# Patient Record
Sex: Female | Born: 1935
Health system: Southern US, Community
[De-identification: ages and names within clinical notes are randomized; demographics above are authoritative.]

## PROBLEM LIST (undated history)

## (undated) DIAGNOSIS — E785 Hyperlipidemia, unspecified: Secondary | ICD-10-CM

## (undated) DIAGNOSIS — R42 Dizziness and giddiness: Secondary | ICD-10-CM

## (undated) DIAGNOSIS — T4145XA Adverse effect of unspecified anesthetic, initial encounter: Secondary | ICD-10-CM

## (undated) DIAGNOSIS — M159 Polyosteoarthritis, unspecified: Secondary | ICD-10-CM

## (undated) DIAGNOSIS — I499 Cardiac arrhythmia, unspecified: Secondary | ICD-10-CM

## (undated) DIAGNOSIS — R011 Cardiac murmur, unspecified: Secondary | ICD-10-CM

## (undated) DIAGNOSIS — D649 Anemia, unspecified: Secondary | ICD-10-CM

## (undated) DIAGNOSIS — G4730 Sleep apnea, unspecified: Secondary | ICD-10-CM

## (undated) DIAGNOSIS — I00 Rheumatic fever without heart involvement: Secondary | ICD-10-CM

## (undated) DIAGNOSIS — I10 Essential (primary) hypertension: Secondary | ICD-10-CM

## (undated) DIAGNOSIS — K219 Gastro-esophageal reflux disease without esophagitis: Secondary | ICD-10-CM

## (undated) DIAGNOSIS — F419 Anxiety disorder, unspecified: Secondary | ICD-10-CM

## (undated) DIAGNOSIS — M353 Polymyalgia rheumatica: Secondary | ICD-10-CM

## (undated) DIAGNOSIS — H9191 Unspecified hearing loss, right ear: Secondary | ICD-10-CM

## (undated) DIAGNOSIS — I480 Paroxysmal atrial fibrillation: Secondary | ICD-10-CM

## (undated) DIAGNOSIS — D126 Benign neoplasm of colon, unspecified: Secondary | ICD-10-CM

## (undated) DIAGNOSIS — T8859XA Other complications of anesthesia, initial encounter: Secondary | ICD-10-CM

## (undated) DIAGNOSIS — T7840XA Allergy, unspecified, initial encounter: Secondary | ICD-10-CM

## (undated) DIAGNOSIS — J301 Allergic rhinitis due to pollen: Secondary | ICD-10-CM

## (undated) DIAGNOSIS — N3941 Urge incontinence: Secondary | ICD-10-CM

## (undated) DIAGNOSIS — J479 Bronchiectasis, uncomplicated: Secondary | ICD-10-CM

## (undated) DIAGNOSIS — R112 Nausea with vomiting, unspecified: Secondary | ICD-10-CM

## (undated) DIAGNOSIS — IMO0002 Reserved for concepts with insufficient information to code with codable children: Secondary | ICD-10-CM

## (undated) DIAGNOSIS — Z9889 Other specified postprocedural states: Secondary | ICD-10-CM

## (undated) DIAGNOSIS — H9190 Unspecified hearing loss, unspecified ear: Secondary | ICD-10-CM

## (undated) DIAGNOSIS — H269 Unspecified cataract: Secondary | ICD-10-CM

## (undated) HISTORY — DX: Rheumatic fever without heart involvement: I00

## (undated) HISTORY — DX: Urge incontinence: N39.41

## (undated) HISTORY — DX: Hyperlipidemia, unspecified: E78.5

## (undated) HISTORY — DX: Allergy, unspecified, initial encounter: T78.40XA

## (undated) HISTORY — DX: Polyosteoarthritis, unspecified: M15.9

## (undated) HISTORY — PX: BACK SURGERY: SHX140

## (undated) HISTORY — DX: Paroxysmal atrial fibrillation: I48.0

## (undated) HISTORY — DX: Gastro-esophageal reflux disease without esophagitis: K21.9

## (undated) HISTORY — PX: EYE SURGERY: SHX253

## (undated) HISTORY — DX: Polymyalgia rheumatica: M35.3

## (undated) HISTORY — PX: ABDOMINAL HYSTERECTOMY: SHX81

## (undated) HISTORY — DX: Unspecified cataract: H26.9

## (undated) HISTORY — DX: Essential (primary) hypertension: I10

## (undated) HISTORY — PX: FRACTURE SURGERY: SHX138

## (undated) HISTORY — DX: Unspecified hearing loss, right ear: H91.91

## (undated) HISTORY — DX: Dizziness and giddiness: R42

## (undated) HISTORY — PX: TONSILLECTOMY AND ADENOIDECTOMY: SUR1326

## (undated) HISTORY — DX: Benign neoplasm of colon, unspecified: D12.6

## (undated) HISTORY — PX: SPINE SURGERY: SHX786

## (undated) HISTORY — DX: Reserved for concepts with insufficient information to code with codable children: IMO0002

## (undated) HISTORY — DX: Allergic rhinitis due to pollen: J30.1

---

## 1961-12-22 HISTORY — PX: OTHER SURGICAL HISTORY: SHX169

## 1971-12-23 HISTORY — PX: SYNOVECTOMY: SHX133

## 1994-12-22 HISTORY — PX: ANKLE FRACTURE SURGERY: SHX122

## 2006-11-09 HISTORY — PX: OTHER SURGICAL HISTORY: SHX169

## 2008-12-22 HISTORY — PX: BREAST BIOPSY: SHX20

## 2012-02-25 LAB — HM COLONOSCOPY

## 2014-02-28 LAB — HM MAMMOGRAPHY: HM Mammogram: NORMAL

## 2014-04-04 ENCOUNTER — Ambulatory Visit (INDEPENDENT_AMBULATORY_CARE_PROVIDER_SITE_OTHER): Payer: Medicare PPO | Admitting: Internal Medicine

## 2014-04-04 ENCOUNTER — Encounter: Payer: Self-pay | Admitting: Internal Medicine

## 2014-04-04 VITALS — BP 118/80 | HR 66 | Temp 98.0°F | Ht 66.0 in | Wt 178.0 lb

## 2014-04-04 DIAGNOSIS — J301 Allergic rhinitis due to pollen: Secondary | ICD-10-CM | POA: Insufficient documentation

## 2014-04-04 DIAGNOSIS — E782 Mixed hyperlipidemia: Secondary | ICD-10-CM | POA: Insufficient documentation

## 2014-04-04 DIAGNOSIS — K219 Gastro-esophageal reflux disease without esophagitis: Secondary | ICD-10-CM | POA: Insufficient documentation

## 2014-04-04 DIAGNOSIS — I10 Essential (primary) hypertension: Secondary | ICD-10-CM | POA: Insufficient documentation

## 2014-04-04 DIAGNOSIS — M353 Polymyalgia rheumatica: Secondary | ICD-10-CM | POA: Insufficient documentation

## 2014-04-04 DIAGNOSIS — E785 Hyperlipidemia, unspecified: Secondary | ICD-10-CM | POA: Insufficient documentation

## 2014-04-04 DIAGNOSIS — M159 Polyosteoarthritis, unspecified: Secondary | ICD-10-CM | POA: Insufficient documentation

## 2014-04-04 DIAGNOSIS — IMO0002 Reserved for concepts with insufficient information to code with codable children: Secondary | ICD-10-CM | POA: Insufficient documentation

## 2014-04-04 DIAGNOSIS — I4891 Unspecified atrial fibrillation: Secondary | ICD-10-CM | POA: Insufficient documentation

## 2014-04-04 NOTE — Assessment & Plan Note (Signed)
She is okay with primary prevention but want to reconsider Myalgias with other statins

## 2014-04-04 NOTE — Progress Notes (Signed)
Subjective:    Patient ID: Jennifer Schmitt, female    DOB: [birth year], 79 y.o.   MRN: [id number]  HPI Here to establish Recently moved to [facility]  History of atrial fibrillation Goes back over 8 years At least 5 hospitalizations for irregular and fast heart CCB in past but not now Only on beta blocker now Had been on xarelto 20mg  but cardiologist reduced her to 10mg  Insurance now giving her problems about this---asked her to have cardiologist send in appeal  Rheumatic fever as a child Heart murmur in past Has had echo--not sure of results May have MVP  On Rx for both high blood pressure and high cholesterol for some time Doing fine on current meds  Lung problems with chronic bronchitis Has been better with cetirizine, astelin-- montelukast at times and nasal cortisone sprays Pollen and other allergens---multiple  Polymyalgia rheumatica for 18 months Was on prednisone for ~3 years ago Seems to be in remission again Ongoing arthritis symptoms that are mild---not taking anything regularly for this  GERD chronically Omeprazole daily and probiotics help No dysphagia persistently---may be more related to allergies  Adenomatous colon polyps Last colonoscopy 2013 was negative  Mild urge incontinence  Cervical and lumbar disc disease No meds for this now  No current outpatient prescriptions on file prior to visit.   No current facility-administered medications on file prior to visit.    Allergies  Allergen Reactions  . Actonel [Risedronate Sodium]   . Boniva [Ibandronic Acid]   . Fosamax [Alendronate Sodium]   . Latex   . Lipitor [Atorvastatin]   . Talwin [Pentazocine]     Past Medical History  Diagnosis Date  . Hypertension   . Hyperlipidemia   . GERD (gastroesophageal reflux disease)   . Allergic rhinitis due to pollen   . Atrial fibrillation   . PMR (polymyalgia rheumatica)   . Osteoarthritis, multiple sites   . Urge incontinence   . Adenomatous  colon polyp   . Rheumatic fever     Past Surgical History  Procedure Laterality Date  . Tonsillectomy and adenoidectomy    . Meningitis  1963  . Abdominal hysterectomy    . Breast biopsy Left 2010    negative  . Ankle fracture surgery Left 1996  . Synovectomy Right 1973    elbow    Family History  Problem Relation Age of Onset  . Schizophrenia Mother     paranoid  . Heart disease Mother     from psych meds  . Arthritis Mother   . Rheum arthritis Mother   . Cancer Father     prostate  . Arthritis Father   . Diabetes Father     History   Social History  . Marital Status: Married    Spouse Name: N/A    Number of Children: 1  . Years of Education: N/A   Occupational History  . 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 Smoking status: Never Smoker   . Smokeless tobacco: Never Used  . Alcohol Use: Yes  . Drug Use: No  . Sexual Activity: Not on file   Other Topics Concern  . Not on file   Social History Narrative   Retired 2007    1 son in [place] area   Spends part time in [place]      Has living will   Husband then son is health care POA   Would  accept resuscitation probably--but no prolonged ventilation   No tube feedings if cognitively unaware         Review of Systems  Constitutional:       Wears seat belt Weight is down from 4 years ago-- down 30# or so from peak Regular with exercise  HENT: Negative for hearing loss and tinnitus.   Eyes: Negative for visual disturbance.       Mild vision decline Some cataracts  Respiratory: Positive for cough. Negative for chest tightness and shortness of breath.        Episodic cough  Cardiovascular: Positive for palpitations. Negative for chest pain and leg swelling.  Gastrointestinal:       Heartburn controlled  Genitourinary:       Mild urge incontinence  Musculoskeletal: Positive for arthralgias and myalgias.    Allergic/Immunologic: Positive for environmental allergies. Negative for immunocompromised state.  Neurological: Negative for dizziness, syncope, light-headedness and headaches.  Psychiatric/Behavioral: Negative for sleep disturbance and dysphoric mood. The patient is nervous/anxious.        Sleeps better since weight loss. Sleep study done 3-4 years ago--- some apnea and abnormal movements Has xanax for rare anxiety spells       Objective:   Physical Exam  Constitutional: She appears well-developed and well-nourished. No distress.  Neck: Normal range of motion. Neck supple. No thyromegaly present.  Cardiovascular: Normal rate, regular rhythm, normal heart sounds and intact distal pulses.  Exam reveals no gallop.   No murmur heard. Pulmonary/Chest: Effort normal and breath sounds normal. No respiratory distress. She has no wheezes. She has no rales.  Abdominal: Soft. There is no tenderness.  Musculoskeletal: She exhibits no edema and no tenderness.  Lymphadenopathy:    She has no cervical adenopathy.  Psychiatric: She has a normal mood and affect. Her behavior is normal.          Assessment & Plan:

## 2014-04-04 NOTE — Assessment & Plan Note (Signed)
Regular now Needs to get approval for the xarelto Will establish with derm

## 2014-04-04 NOTE — Patient Instructions (Signed)
Please get the last 3 years records from your [facility] cardiologist and primary care doctor

## 2014-04-04 NOTE — Assessment & Plan Note (Signed)
Quiet In remission for over a year

## 2014-04-04 NOTE — Assessment & Plan Note (Signed)
Uses tylenol prn only

## 2014-04-04 NOTE — Assessment & Plan Note (Signed)
Severe Needs multiple meds

## 2014-04-04 NOTE — Assessment & Plan Note (Signed)
BP Readings from Last 3 Encounters:  04/04/14 118/80   Good control

## 2014-04-04 NOTE — Assessment & Plan Note (Signed)
Quiet on the med 

## 2014-04-04 NOTE — Progress Notes (Signed)
Pre visit review using our clinic review tool, if applicable. No additional management support is needed unless otherwise documented below in the visit note. 

## 2014-04-05 ENCOUNTER — Telehealth: Payer: Self-pay | Admitting: Internal Medicine

## 2014-04-05 NOTE — Telephone Encounter (Signed)
Relevant patient education mailed to patient.  

## 2014-04-20 ENCOUNTER — Encounter: Payer: Self-pay | Admitting: Cardiovascular Disease

## 2014-04-20 ENCOUNTER — Ambulatory Visit (INDEPENDENT_AMBULATORY_CARE_PROVIDER_SITE_OTHER): Payer: Medicare PPO | Admitting: Cardiovascular Disease

## 2014-04-20 VITALS — BP 120/72 | HR 64 | Ht 66.0 in | Wt 178.5 lb

## 2014-04-20 DIAGNOSIS — G4733 Obstructive sleep apnea (adult) (pediatric): Secondary | ICD-10-CM

## 2014-04-20 DIAGNOSIS — I4891 Unspecified atrial fibrillation: Secondary | ICD-10-CM

## 2014-04-20 DIAGNOSIS — I10 Essential (primary) hypertension: Secondary | ICD-10-CM

## 2014-04-20 DIAGNOSIS — E785 Hyperlipidemia, unspecified: Secondary | ICD-10-CM

## 2014-04-20 MED ORDER — DILTIAZEM HCL 30 MG PO TABS: 30.0000 mg | ORAL_TABLET | Freq: Three times a day (TID) | ORAL | Status: DC | PRN

## 2014-04-20 MED ORDER — PROPRANOLOL HCL 10 MG PO TABS: 10.0000 mg | ORAL_TABLET | Freq: Three times a day (TID) | ORAL | Status: DC | PRN

## 2014-04-20 MED ORDER — RIVAROXABAN 15 MG PO TABS: 15.0000 mg | ORAL_TABLET | Freq: Every day | ORAL | Status: DC

## 2014-04-20 NOTE — Assessment & Plan Note (Signed)
She does not tolerate CPAP. This could contribute to her paroxysmal atrial fibrillation. Recent 30 pound weight loss are more may contribute to better sleep and improved symptoms

## 2014-04-20 NOTE — Patient Instructions (Addendum)
You are doing well. No medication changes were made.  If blood pressure runs low, Please cut the valsartan in 1/2   If you have an episode of atrial fibrillation, Take diltiazem one pill Two hours later, if still in a fib, Take propranolol one pill  Please call us if you have new issues that need to be addressed before your next appt.  Your physician wants you to follow-up in: 6 months.  You will receive a reminder letter in the mail two months in advance. If you don't receive a letter, please call our office to schedule the follow-up appointment.

## 2014-04-20 NOTE — Progress Notes (Signed)
Patient ID: Jennifer Schmitt, female    DOB: Mar 25, 1935, 79 y.o.   MRN: [id number]  HPI Comments: Jennifer Schmitt is a very pleasant 79 year old woman with long history of paroxysmal atrial fibrillation, obstructive sleep apnea who does not wear CPAP, hypertension, hyperlipidemia who presents to establish care in the [place] office.  She reports having atrial fibrillation dating back for obese 12 years. Typically symptoms will go for 6 hours or less. Her last 3 episodes were in February 2014, December 2014, February 2015. She reports that her xarelto dose was previously decreased down to 10 mg daily. He insurance company is giving her a hard time and she does not have enough pills to last. On her past several episodes of atrial fibrillation, episodes have been 6 hours her last. Most of the time they come in the middle of the night, December episode change in the daytime.  In general she feels well, is active. She does not do regular exercise regimen. Recent stressors as she has moved to the area.  She does report having difficulty with medications in the past, in particular IV diltiazem caused her heart rate and blood pressure to drop She spends much of her winter in [place]  EKG shows normal sinus rhythm with rate 64 beats per minute, no significant ST or T wave changes    Outpatient Encounter Prescriptions as of 04/20/2014  Medication Sig  . acetaminophen (TYLENOL) 500 MG tablet Take 1,000 mg by mouth daily.  Marland Kitchen ALPRAZolam (XANAX) 0.25 MG tablet Take 0.25 mg by mouth at bedtime as needed for anxiety.  . ARTIFICIAL TEAR OP Apply to eye as needed.  Marland Kitchen azelastine (ASTELIN) 137 MCG/SPRAY nasal spray Place 1 spray into both nostrils 2 (two) times daily. Use in each nostril as directed  . cetirizine (ZYRTEC) 10 MG tablet Take 10 mg by mouth daily.  . Cholecalciferol (VITAMIN D) 2000 UNITS CAPS Take 1 capsule by mouth daily.  . clobetasol cream (TEMOVATE) 7.37 % Apply 1 application topically 2 (two) times  daily.  Marland Kitchen desonide (DESOWEN) 0.05 % lotion Apply 1 application topically 2 (two) times daily.  . fluticasone (FLONASE) 50 MCG/ACT nasal spray Place 2 sprays into both nostrils daily.  . folic acid (FOLVITE) 1 MG tablet Take 1 mg by mouth daily.  Marland Kitchen ketotifen (ZADITOR) 0.025 % ophthalmic solution Place 1 drop into both eyes daily.  . metoprolol succinate (TOPROL-XL) 25 MG 24 hr tablet Take 25 mg by mouth daily.  . montelukast (SINGULAIR) 10 MG tablet Take 10 mg by mouth 3 times/day as needed-between meals & bedtime.   Marland Kitchen omeprazole (PRILOSEC) 20 MG capsule Take 20 mg by mouth daily.  . Probiotic Product (PROBIOTIC DAILY PO) Take 2 capsules by mouth daily.   . rivaroxaban (XARELTO) 15 MG TABS tablet Take 1 tablet (15 mg total) by mouth daily.  . rosuvastatin (CRESTOR) 5 MG tablet Take 5 mg by mouth daily.  Marland Kitchen tolnaftate (ANTIFUNGAL) 1 % cream Apply 1 application topically 2 (two) times daily.  . valsartan-hydrochlorothiazide (DIOVAN-HCT) 80-12.5 MG per tablet Take 1 tablet by mouth daily.  .  rivaroxaban (XARELTO) 10 MG TABS tablet Take 10 mg by mouth daily.     Review of Systems  Constitutional: Negative.   HENT: Negative.   Eyes: Negative.   Respiratory: Negative.   Cardiovascular: Negative.   Gastrointestinal: Negative.   Endocrine: Negative.   Musculoskeletal: Negative.   Skin: Negative.   Allergic/Immunologic: Negative.   Neurological: Negative.   Hematological: Negative.   Psychiatric/Behavioral:  Negative.   All other systems reviewed and are negative.   BP 120/72  Pulse 64  Ht 5\' 6"  (1.676 m)  Wt 178 lb 8 oz (80.967 kg)  BMI 28.82 kg/m2  Physical Exam  Nursing note and vitals reviewed. Constitutional: She is oriented to person, place, and time. She appears well-developed and well-nourished.  HENT:  Head: Normocephalic.  Nose: Nose normal.  Mouth/Throat: Oropharynx is clear and moist.  Eyes: Conjunctivae are normal. Pupils are equal, round, and reactive to light.   Neck: Normal range of motion. Neck supple. No JVD present.  Cardiovascular: Normal rate, regular rhythm, S1 normal, S2 normal, normal heart sounds and intact distal pulses.  Exam reveals no gallop and no friction rub.   No murmur heard. Pulmonary/Chest: Effort normal and breath sounds normal. No respiratory distress. She has no wheezes. She has no rales. She exhibits no tenderness.  Abdominal: Soft. Bowel sounds are normal. She exhibits no distension. There is no tenderness.  Musculoskeletal: Normal range of motion. She exhibits no edema and no tenderness.  Lymphadenopathy:    She has no cervical adenopathy.  Neurological: She is alert and oriented to person, place, and time. Coordination normal.  Skin: Skin is warm and dry. No rash noted. No erythema.  Psychiatric: She has a normal mood and affect. Her behavior is normal. Judgment and thought content normal.    Assessment and Plan

## 2014-04-20 NOTE — Assessment & Plan Note (Addendum)
Long discussion with her about atrial fibrillation. Prior records have been requested from 2 cardiologist . She has long-standing paroxysmal atrial fibrillation. We did not change her metoprolol dosing. Uncertain why she is on Xarelto 10 mg daily as she does not have a history of GI bleed. She prefers a lower dose. We'll increase the dose up to 15 mg daily as she is having insurance problems with 10 mg. We have given her low dose propranolol and diltiazem to take as needed for breakthrough atrial fibrillation. We have recommended she call our office for each episode

## 2014-04-20 NOTE — Assessment & Plan Note (Signed)
Cholesterol is at goal on the current lipid regimen. No changes to the medications were made.  

## 2014-04-20 NOTE — Assessment & Plan Note (Signed)
She reports blood pressure has been running low. We have suggested she closely monitor her blood pressure at home. If this continues to run low as she claims, we have recommended she cut her valsartan and HCTZ in half

## 2014-05-02 ENCOUNTER — Encounter: Payer: Self-pay | Admitting: Internal Medicine

## 2014-05-16 ENCOUNTER — Telehealth: Payer: Self-pay | Admitting: Internal Medicine

## 2014-05-16 NOTE — Telephone Encounter (Signed)
Pt drops off a [employer] form to be filled out by Dr Silvio Pate. Pt also requests a call back on information regarding a DNR.  Thank you

## 2014-05-16 NOTE — Telephone Encounter (Signed)
Pt would also like a DNR form filled out. Form on your desk

## 2014-05-17 ENCOUNTER — Encounter: Payer: Self-pay | Admitting: Internal Medicine

## 2014-05-17 NOTE — Telephone Encounter (Signed)
I left a message on patient's voice mail.  Medical Clearance form faxed to [facility].  Patient told second DNR form is for [facility].  I left all the forms up front for patient to pick up.

## 2014-05-17 NOTE — Telephone Encounter (Signed)
Form done Let her know that [facility] requests a copy of the DNR for their files---please stamp address on both

## 2014-05-25 ENCOUNTER — Encounter: Payer: Self-pay | Admitting: Internal Medicine

## 2014-08-09 ENCOUNTER — Other Ambulatory Visit: Payer: Self-pay | Admitting: Internal Medicine

## 2014-08-10 MED ORDER — METOPROLOL SUCCINATE ER 25 MG PO TB24: 25.0000 mg | ORAL_TABLET | Freq: Every day | ORAL | Status: DC

## 2014-08-24 ENCOUNTER — Other Ambulatory Visit: Payer: Self-pay | Admitting: Internal Medicine

## 2014-09-14 ENCOUNTER — Ambulatory Visit (INDEPENDENT_AMBULATORY_CARE_PROVIDER_SITE_OTHER): Payer: Medicare PPO | Admitting: Family Medicine

## 2014-09-14 ENCOUNTER — Encounter: Payer: Self-pay | Admitting: Family Medicine

## 2014-09-14 VITALS — BP 138/82 | HR 80 | Temp 98.7°F | Wt 177.5 lb

## 2014-09-14 DIAGNOSIS — R0981 Nasal congestion: Secondary | ICD-10-CM

## 2014-09-14 DIAGNOSIS — J309 Allergic rhinitis, unspecified: Secondary | ICD-10-CM

## 2014-09-14 DIAGNOSIS — J3489 Other specified disorders of nose and nasal sinuses: Secondary | ICD-10-CM

## 2014-09-14 MED ORDER — METHYLPREDNISOLONE ACETATE 40 MG/ML IJ SUSP
80.0000 mg | Freq: Once | INTRAMUSCULAR | Status: AC
  Administered 2014-09-14: 80 mg via INTRAMUSCULAR

## 2014-09-14 NOTE — Assessment & Plan Note (Signed)
New- unlikely bacterial at this point. Given significant symptoms and chronic medications, will treat with IM depo medrol. Add another antihistamine without decongestant, like allegra or Claritin. Also suggested nasocort or flonase. Call or return to clinic prn if these symptoms worsen or fail to improve as anticipated. The patient indicates understanding of these issues and agrees with the plan.

## 2014-09-14 NOTE — Progress Notes (Signed)
Pre visit review using our clinic review tool, if applicable. No additional management support is needed unless otherwise documented below in the visit note. 

## 2014-09-14 NOTE — Patient Instructions (Signed)
Nice to meet you.  Try over the counter nasocort-start with 2 sprays per nostril per day...and then try to taper to 1 spray per nostril once symptoms improve.   Ok to add Claritin or Allegra.  Follow up with myself or Dr. Silvio Pate if your symptoms are not improving over next 3-5 days as expected.

## 2014-09-14 NOTE — Progress Notes (Signed)
Subjective:   Patient ID: Jennifer Schmitt, female    DOB: 1935-12-05, 79 y.o.   MRN: [id number]  Jennifer Schmitt is a very pleasant 79 y.o. year old female pt of Dr. Silvio Pate, new to me,with  complicated medical history including afib on xarelto, who presents to clinic today with Nasal Congestion and Headache  on 09/14/2014  HPI:  H/o allergic rhinitis-  Taking Zyrtec daily.  Allergies can get quite severe this time of year  Past few days, worsening sore throat and nasal congestion. Forehead and teeth starting to ache. No fevers or chills.  No CP No SOB No wheezing No cough  Went to Dentist two days ago, he told her that sore throat and tooth pain is not related to her teeth.  Current Outpatient Prescriptions on File Prior to Visit  Medication Sig Dispense Refill  . acetaminophen (TYLENOL) 500 MG tablet Take 1,000 mg by mouth daily.      Marland Kitchen ALPRAZolam (XANAX) 0.25 MG tablet Take 0.25 mg by mouth at bedtime as needed for anxiety.      . ARTIFICIAL TEAR OP Apply to eye as needed.      . cetirizine (ZYRTEC) 10 MG tablet Take 10 mg by mouth daily.      . Cholecalciferol (VITAMIN D) 2000 UNITS CAPS Take 1 capsule by mouth daily.      . clobetasol cream (TEMOVATE) 1.24 % Apply 1 application topically 2 (two) times daily.      . CRESTOR 5 MG tablet TAKE 1 TABLET EVERY DAY  90 tablet  1  . desonide (DESOWEN) 0.05 % lotion Apply 1 application topically 2 (two) times daily.      Marland Kitchen diltiazem (CARDIZEM) 30 MG tablet Take 1 tablet (30 mg total) by mouth 3 (three) times daily as needed (breakthrough atrial fibrillation).  90 tablet  3  . folic acid (FOLVITE) 1 MG tablet TAKE 1 TABLET EVERY DAY  90 tablet  1  . ketotifen (ZADITOR) 0.025 % ophthalmic solution Place 1 drop into both eyes daily.      . metoprolol succinate (TOPROL-XL) 25 MG 24 hr tablet Take 1 tablet (25 mg total) by mouth daily.  90 tablet  3  . omeprazole (PRILOSEC) 20 MG capsule Take 20 mg by mouth daily.      . Probiotic Product  (PROBIOTIC DAILY PO) Take 2 capsules by mouth daily.       . propranolol (INDERAL) 10 MG tablet Take 1 tablet (10 mg total) by mouth 3 (three) times daily as needed (breakthrough atrial fibrillation).  90 tablet  3  . rivaroxaban (XARELTO) 15 MG TABS tablet Take 1 tablet (15 mg total) by mouth daily.  30 tablet  4  . tolnaftate (ANTIFUNGAL) 1 % cream Apply 1 application topically 2 (two) times daily.      . valsartan-hydrochlorothiazide (DIOVAN-HCT) 80-12.5 MG per tablet Take 1 tablet by mouth daily.       No current facility-administered medications on file prior to visit.    Allergies  Allergen Reactions  . Actonel [Risedronate Sodium]   . Boniva [Ibandronic Acid]   . Fosamax [Alendronate Sodium]   . Latex   . Lipitor [Atorvastatin]   . Niaspan [Niacin Er]     Rash & swelling   . Talwin [Pentazocine]     Past Medical History  Diagnosis Date  . Hypertension   . Hyperlipidemia   . GERD (gastroesophageal reflux disease)   . Allergic rhinitis due to pollen   . Atrial fibrillation   .  PMR (polymyalgia rheumatica)   . Osteoarthritis, multiple sites   . Urge incontinence   . Adenomatous colon polyp   . Rheumatic fever   . Degenerative disc disease     cervical and lumbar  . Arrhythmia     A-Fib    Past Surgical History  Procedure Laterality Date  . Tonsillectomy and adenoidectomy    . Meningitis  1963  . Abdominal hysterectomy    . Breast biopsy Left 2010    negative  . Ankle fracture surgery Left 1996  . Synovectomy Right 1973    elbow  . Spine surgery    . Dexa  11/09/06    normal    Family History  Problem Relation Age of Onset  . Schizophrenia Mother     paranoid  . Heart disease Mother     from psych meds  . Arthritis Mother   . Rheum arthritis Mother   . Cancer Father     prostate  . Arthritis Father   . Diabetes Father     History   Social History  . Marital Status: Married    Spouse Name: N/A    Number of Children: 1  . Years of Education:  N/A   Occupational History  . 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 Smoking status: Never Smoker   . Smokeless tobacco: Never Used  . Alcohol Use: Yes  . Drug Use: No  . Sexual Activity: Not on file   Other Topics Concern  . Not on file   Social History Narrative   Retired 2007    1 son in [place] area   Spends part time in [place]      Has living will   Husband then son is health care POA   Not sure about DNR---will keep open resuscitation for now (05/25/14)   No tube feedings if cognitively unaware               The PMH, PSH, Social History, Family History, Medications, and allergies have been reviewed in [facility], and have been updated if relevant.   Review of Systems  Constitutional: Negative for fever, chills, diaphoresis and fatigue.  HENT: Positive for congestion, postnasal drip, rhinorrhea, sinus pressure, sneezing and sore throat. Negative for dental problem, ear pain, facial swelling, trouble swallowing and voice change.   Respiratory: Negative.   Cardiovascular: Negative.   Skin: Negative.   All other systems reviewed and are negative.      Objective:    BP 138/82  Pulse 80  Temp(Src) 98.7 F (37.1 C) (Oral)  Wt 177 lb 8 oz (80.513 kg)  SpO2 98%   Physical Exam  Constitutional: She appears well-developed and well-nourished. No distress.  HENT:  Head: Normocephalic.  Right Ear: External ear normal. Tympanic membrane is not retracted and not bulging.  Left Ear: External ear normal. Tympanic membrane is not retracted and not bulging.  Nose: Mucosal edema, rhinorrhea and sinus tenderness present. Right sinus exhibits frontal sinus tenderness. Right sinus exhibits no maxillary sinus tenderness. Left sinus exhibits frontal sinus tenderness. Left sinus exhibits no maxillary sinus tenderness.  Mouth/Throat: Uvula is midline and mucous membranes are normal. Posterior oropharyngeal  erythema present. No oropharyngeal exudate, posterior oropharyngeal edema or tonsillar abscesses.  Pulmonary/Chest: Effort normal and breath sounds normal. No respiratory distress.  Skin: Skin is warm, dry and intact.  Psychiatric: She has a normal mood and affect. Her speech  is normal and behavior is normal. Judgment and thought content normal.          Assessment & Plan:   No diagnosis found. No Follow-up on file.

## 2014-09-28 ENCOUNTER — Ambulatory Visit: Payer: Medicare PPO | Admitting: Cardiovascular Disease

## 2014-09-28 ENCOUNTER — Encounter: Payer: Self-pay | Admitting: Cardiovascular Disease

## 2014-09-28 ENCOUNTER — Ambulatory Visit (INDEPENDENT_AMBULATORY_CARE_PROVIDER_SITE_OTHER): Payer: Medicare PPO | Admitting: Cardiovascular Disease

## 2014-09-28 VITALS — BP 110/78 | HR 54 | Ht 66.0 in | Wt 178.2 lb

## 2014-09-28 DIAGNOSIS — I10 Essential (primary) hypertension: Secondary | ICD-10-CM

## 2014-09-28 DIAGNOSIS — I4891 Unspecified atrial fibrillation: Secondary | ICD-10-CM

## 2014-09-28 DIAGNOSIS — G4733 Obstructive sleep apnea (adult) (pediatric): Secondary | ICD-10-CM

## 2014-09-28 DIAGNOSIS — E785 Hyperlipidemia, unspecified: Secondary | ICD-10-CM

## 2014-09-28 DIAGNOSIS — M353 Polymyalgia rheumatica: Secondary | ICD-10-CM

## 2014-09-28 NOTE — Patient Instructions (Signed)
You are doing well. No medication changes were made.  Please call us if you have new issues that need to be addressed before your next appt.  Your physician wants you to follow-up in: 6 months.  You will receive a reminder letter in the mail two months in advance. If you don't receive a letter, please call our office to schedule the follow-up appointment.   

## 2014-09-28 NOTE — Assessment & Plan Note (Signed)
Blood pressure is well controlled on today's visit. No changes made to the medications. Elevated blood pressure in the setting of headaches, likely situational

## 2014-09-28 NOTE — Progress Notes (Signed)
Patient ID: Jennifer Schmitt, female    DOB: 04/30/1935, 79 y.o.   MRN: [id number]  HPI Comments: Jennifer Schmitt is a very pleasant 79 year old woman with long history of paroxysmal atrial fibrillation, obstructive sleep apnea who does not wear CPAP, hypertension, hyperlipidemia who presents for routine followup  She reports having an episode of atrial fibrillation in August 2015 It lasted approximately 6 hours. She took diltiazem and propranolol. Shortly after her symptoms seemed to resolve and she converted back to normal sinus rhythm Some restless nights, possibly from sleep apnea. She's waking up with headaches. Blood pressure high headache otherwise normal range Occasionally has palpitations at night, very short-lived  In general she feels well, is active. She does not do regular exercise regimen. She spends much of her winter in [place]  EKG shows normal sinus rhythm with rate 54 beats per minute, no significant ST or T wave changes   Outpatient Encounter Prescriptions as of 09/28/2014  Medication Sig  . acetaminophen (TYLENOL) 500 MG tablet Take 1,000 mg by mouth every 4 (four) hours as needed.   . ALPRAZolam (XANAX) 0.25 MG tablet Take 0.25 mg by mouth at bedtime as needed for anxiety.  . ARTIFICIAL TEAR OP Apply to eye as needed.  . cetirizine (ZYRTEC) 10 MG tablet Take 10 mg by mouth daily.  . Cholecalciferol (VITAMIN D) 2000 UNITS CAPS Take 1 capsule by mouth daily.  . clobetasol cream (TEMOVATE) 1.74 % Apply 1 application topically as needed.   . CRESTOR 5 MG tablet TAKE 1 TABLET EVERY DAY  . desonide (DESOWEN) 0.05 % lotion Apply 1 application topically as needed.   . diltiazem (CARDIZEM) 30 MG tablet Take 1 tablet (30 mg total) by mouth 3 (three) times daily as needed (breakthrough atrial fibrillation).  . folic acid (FOLVITE) 1 MG tablet TAKE 1 TABLET EVERY DAY  . ketotifen (ZADITOR) 0.025 % ophthalmic solution Place 1 drop into both eyes daily.  . metoprolol succinate  (TOPROL-XL) 25 MG 24 hr tablet Take 1 tablet (25 mg total) by mouth daily.  Marland Kitchen omeprazole (PRILOSEC) 20 MG capsule Take 20 mg by mouth daily.  . Probiotic Product (PROBIOTIC DAILY PO) Take 1 capsule by mouth daily.   . propranolol (INDERAL) 10 MG tablet Take 1 tablet (10 mg total) by mouth 3 (three) times daily as needed (breakthrough atrial fibrillation).  . rivaroxaban (XARELTO) 15 MG TABS tablet Take 1 tablet (15 mg total) by mouth daily.  . valsartan-hydrochlorothiazide (DIOVAN-HCT) 80-12.5 MG per tablet Take 1 tablet by mouth daily.    Review of Systems  Constitutional: Negative.   Eyes: Negative.   Respiratory: Negative.   Cardiovascular: Negative.   Gastrointestinal: Negative.   Endocrine: Negative.   Musculoskeletal: Negative.   Skin: Negative.   Allergic/Immunologic: Negative.   Neurological: Positive for headaches.  Hematological: Negative.   Psychiatric/Behavioral: Negative.   All other systems reviewed and are negative.   BP 110/78  Pulse 54  Ht 5\' 6"  (1.676 m)  Wt 178 lb 4 oz (80.854 kg)  BMI 28.78 kg/m2  Physical Exam  Nursing note and vitals reviewed. Constitutional: She is oriented to person, place, and time. She appears well-developed and well-nourished.  HENT:  Head: Normocephalic.  Nose: Nose normal.  Mouth/Throat: Oropharynx is clear and moist.  Eyes: Conjunctivae are normal. Pupils are equal, round, and reactive to light.  Neck: Normal range of motion. Neck supple. No JVD present.  Cardiovascular: Normal rate, regular rhythm, S1 normal, S2 normal, normal heart sounds and intact distal  pulses.  Exam reveals no gallop and no friction rub.   No murmur heard. Pulmonary/Chest: Effort normal and breath sounds normal. No respiratory distress. She has no wheezes. She has no rales. She exhibits no tenderness.  Abdominal: Soft. Bowel sounds are normal. She exhibits no distension. There is no tenderness.  Musculoskeletal: Normal range of motion. She exhibits no  edema and no tenderness.  Lymphadenopathy:    She has no cervical adenopathy.  Neurological: She is alert and oriented to person, place, and time. Coordination normal.  Skin: Skin is warm and dry. No rash noted. No erythema.  Psychiatric: She has a normal mood and affect. Her behavior is normal. Judgment and thought content normal.    Assessment and Plan

## 2014-09-28 NOTE — Assessment & Plan Note (Signed)
Cholesterol is at goal on the current lipid regimen. No changes to the medications were made.  

## 2014-09-28 NOTE — Assessment & Plan Note (Signed)
Paroxysmal atrial fibrillation. We have recommended she continue to take diltiazem and propranolol as needed. If she has more frequent episodes, may need to consider an antiarrhythmic medication. She's currently on xarelto.

## 2014-09-28 NOTE — Assessment & Plan Note (Signed)
She does have some atypical chest pain, reproducible with palpation. Possibly from her PMR, or arthritis or simply chest wall pain/musculoskeletal

## 2014-09-28 NOTE — Assessment & Plan Note (Signed)
I suspect her untreated obstructive sleep apnea may be contributing to her paroxysmal atrial fibrillation. Suggested she talk with her dentist about a mouth piece

## 2014-10-10 ENCOUNTER — Telehealth: Payer: Self-pay | Admitting: Internal Medicine

## 2014-10-10 ENCOUNTER — Encounter: Payer: Self-pay | Admitting: Internal Medicine

## 2014-10-10 ENCOUNTER — Ambulatory Visit (INDEPENDENT_AMBULATORY_CARE_PROVIDER_SITE_OTHER): Payer: Medicare PPO | Admitting: Internal Medicine

## 2014-10-10 VITALS — BP 110/70 | HR 66 | Temp 97.8°F | Ht 66.0 in | Wt 180.0 lb

## 2014-10-10 DIAGNOSIS — E785 Hyperlipidemia, unspecified: Secondary | ICD-10-CM

## 2014-10-10 DIAGNOSIS — Z7189 Other specified counseling: Secondary | ICD-10-CM

## 2014-10-10 DIAGNOSIS — I480 Paroxysmal atrial fibrillation: Secondary | ICD-10-CM

## 2014-10-10 DIAGNOSIS — Z23 Encounter for immunization: Secondary | ICD-10-CM

## 2014-10-10 DIAGNOSIS — M353 Polymyalgia rheumatica: Secondary | ICD-10-CM

## 2014-10-10 DIAGNOSIS — I10 Essential (primary) hypertension: Secondary | ICD-10-CM

## 2014-10-10 DIAGNOSIS — R202 Paresthesia of skin: Secondary | ICD-10-CM

## 2014-10-10 DIAGNOSIS — Z0000 Encounter for general adult medical examination without abnormal findings: Secondary | ICD-10-CM | POA: Insufficient documentation

## 2014-10-10 DIAGNOSIS — K219 Gastro-esophageal reflux disease without esophagitis: Secondary | ICD-10-CM

## 2014-10-10 LAB — CBC WITH DIFFERENTIAL/PLATELET
Basophils Absolute: 0 10*3/uL (ref 0.0–0.1)
Basophils Relative: 0.3 % (ref 0.0–3.0)
Eosinophils Absolute: 0.1 10*3/uL (ref 0.0–0.7)
Eosinophils Relative: 1.4 % (ref 0.0–5.0)
HCT: 37.4 % (ref 36.0–46.0)
Hemoglobin: 12.3 g/dL (ref 12.0–15.0)
Lymphocytes Relative: 23.7 % (ref 12.0–46.0)
Lymphs Abs: 1.1 10*3/uL (ref 0.7–4.0)
MCHC: 33 g/dL (ref 30.0–36.0)
MCV: 102.2 fl — ABNORMAL HIGH (ref 78.0–100.0)
Monocytes Absolute: 0.5 10*3/uL (ref 0.1–1.0)
Monocytes Relative: 10.1 % (ref 3.0–12.0)
Neutro Abs: 3 10*3/uL (ref 1.4–7.7)
Neutrophils Relative %: 64.5 % (ref 43.0–77.0)
Platelets: 235 10*3/uL (ref 150.0–400.0)
RBC: 3.66 Mil/uL — ABNORMAL LOW (ref 3.87–5.11)
RDW: 14.2 % (ref 11.5–15.5)
WBC: 4.7 10*3/uL (ref 4.0–10.5)

## 2014-10-10 LAB — LIPID PANEL
CHOL/HDL RATIO: 2
Cholesterol: 155 mg/dL (ref 0–200)
HDL: 77.1 mg/dL (ref >39.00)
LDL CALC: 60 mg/dL (ref 0–99)
NonHDL: 77.9
TRIGLYCERIDES: 88 mg/dL (ref 0.0–149.0)
VLDL: 17.6 mg/dL (ref 0.0–40.0)

## 2014-10-10 LAB — COMPREHENSIVE METABOLIC PANEL WITH GFR
ALT: 14 U/L (ref 0–35)
AST: 20 U/L (ref 0–37)
Albumin: 3.6 g/dL (ref 3.5–5.2)
Alkaline Phosphatase: 52 U/L (ref 39–117)
BUN: 21 mg/dL (ref 6–23)
CO2: 30 meq/L (ref 19–32)
Calcium: 9.3 mg/dL (ref 8.4–10.5)
Chloride: 102 meq/L (ref 96–112)
Creatinine, Ser: 0.9 mg/dL (ref 0.4–1.2)
GFR: 67.99 mL/min
Glucose, Bld: 73 mg/dL (ref 70–99)
Potassium: 4.8 meq/L (ref 3.5–5.1)
Sodium: 140 meq/L (ref 135–145)
Total Bilirubin: 0.8 mg/dL (ref 0.2–1.2)
Total Protein: 7.7 g/dL (ref 6.0–8.3)

## 2014-10-10 LAB — T4, FREE: Free T4: 0.87 ng/dL (ref 0.60–1.60)

## 2014-10-10 LAB — SEDIMENTATION RATE: Sed Rate: 52 mm/hr — ABNORMAL HIGH (ref 0–22)

## 2014-10-10 LAB — VITAMIN B12: Vitamin B-12: 31 pg/mL — ABNORMAL LOW (ref 211–911)

## 2014-10-10 MED ORDER — NYSTATIN 100000 UNIT/GM EX POWD: 1.0000 g | Freq: Two times a day (BID) | CUTANEOUS | Status: DC | PRN

## 2014-10-10 NOTE — Telephone Encounter (Signed)
i think this may have been entered on the incorrect pt; the name on the form i have is different

## 2014-10-10 NOTE — Assessment & Plan Note (Signed)
BP Readings from Last 3 Encounters:  10/10/14 110/70  09/28/14 110/78  09/14/14 138/82   Good control No changes needed

## 2014-10-10 NOTE — Addendum Note (Signed)
Addended by: Despina Hidden on: 10/10/2014 12:06 PM   Modules accepted: Orders

## 2014-10-10 NOTE — Assessment & Plan Note (Signed)
Mostly past gastritis She will try to decrease omeprazole (like every other day)

## 2014-10-10 NOTE — Assessment & Plan Note (Signed)
I have personally reviewed the Medicare Annual Wellness questionnaire and have noted 1. The patient's medical and social history 2. Their use of alcohol, tobacco or illicit drugs 3. Their current medications and supplements 4. The patient's functional ability including ADL's, fall risks, home safety risks and hearing or visual             impairment. 5. Diet and physical activities 6. Evidence for depression or mood disorders  The patients weight, height, BMI and visual acuity have been recorded in the chart I have made referrals, counseling and provided education to the patient based review of the above and I have provided the pt with a written personalized care plan for preventive services.  I have provided you with a copy of your personalized plan for preventive services. Please take the time to review along with your updated medication list.  Td and prevnar today UTD on mammogram---will not do more Colonoscopy in 2013--shouldn't need another screening Discussed fitness

## 2014-10-10 NOTE — Telephone Encounter (Signed)
ERROR

## 2014-10-10 NOTE — Progress Notes (Signed)
Subjective:    Patient ID: Jennifer Schmitt, female    DOB: 1935-12-06, 79 y.o.   MRN: [id number]  HPI Here for Medicare wellness and follow up Reviewed her advanced directives Admitted ~January in [place] for atrial fib episode. No surgeries Sees [facility] in [place]. Dr Rockey Situ here, Dr Ledell Peoples office for derm. Dr Thalia Bloodgood for dentist. Dr George Ina for eye doctor Slight alcohol--rare. No tobacco products No sexual problems-- no longer active in relationship No falls No sig depression. No anhedonia Independent with all instrumental ADLs and pays bills for husband Tries to go to the gym daily Hearing and vision are okay. Early cataracts are not ready yet No cognitive problems of note  Did have another atrial fib event recently Has diltiazem then propranolol for prn use when she breaks through This worked the last time No chest pain No SOB or sig change in exercise tolerance No dizziness or syncope No edema  Continues on the crestor No myalgias Ongoing stomach issues but doesn't seem to be related to crestor Does get some cramps but only in right leg  Has the disc disease Off all meds Pain at times No signs of the PMR Does get some numbness in fingers and toes  History of non erosive gastritis Takes omeprazole, probiotic and gas-x  Current Outpatient Prescriptions on File Prior to Visit  Medication Sig Dispense Refill  . acetaminophen (TYLENOL) 500 MG tablet Take 1,000 mg by mouth every 4 (four) hours as needed.       . ALPRAZolam (XANAX) 0.25 MG tablet Take 0.25 mg by mouth at bedtime as needed for anxiety.      . ARTIFICIAL TEAR OP Apply to eye as needed.      . cetirizine (ZYRTEC) 10 MG tablet Take 10 mg by mouth daily.      . Cholecalciferol (VITAMIN D) 2000 UNITS CAPS Take 1 capsule by mouth daily.      . clobetasol cream (TEMOVATE) 8.25 % Apply 1 application topically as needed.       . CRESTOR 5 MG tablet TAKE 1 TABLET EVERY DAY  90 tablet   1  . desonide (DESOWEN) 0.05 % lotion Apply 1 application topically as needed.       . diltiazem (CARDIZEM) 30 MG tablet Take 1 tablet (30 mg total) by mouth 3 (three) times daily as needed (breakthrough atrial fibrillation).  90 tablet  3  . folic acid (FOLVITE) 1 MG tablet TAKE 1 TABLET EVERY DAY  90 tablet  1  . ketotifen (ZADITOR) 0.025 % ophthalmic solution Place 1 drop into both eyes daily.      . metoprolol succinate (TOPROL-XL) 25 MG 24 hr tablet Take 1 tablet (25 mg total) by mouth daily.  90 tablet  3  . omeprazole (PRILOSEC) 20 MG capsule Take 20 mg by mouth daily.      . Probiotic Product (PROBIOTIC DAILY PO) Take 1 capsule by mouth daily.       . propranolol (INDERAL) 10 MG tablet Take 1 tablet (10 mg total) by mouth 3 (three) times daily as needed (breakthrough atrial fibrillation).  90 tablet  3  . rivaroxaban (XARELTO) 15 MG TABS tablet Take 1 tablet (15 mg total) by mouth daily.  30 tablet  4  . valsartan-hydrochlorothiazide (DIOVAN-HCT) 80-12.5 MG per tablet Take 1 tablet by mouth daily.       No current facility-administered medications on file prior to visit.    Allergies  Allergen Reactions  . Actonel [  Risedronate Sodium]   . Boniva [Ibandronic Acid]   . Fosamax [Alendronate Sodium]   . Latex   . Lipitor [Atorvastatin]   . Niaspan [Niacin Er]     Rash & swelling   . Talwin [Pentazocine]     Past Medical History  Diagnosis Date  . Hypertension   . Hyperlipidemia   . GERD (gastroesophageal reflux disease)   . Allergic rhinitis due to pollen   . Atrial fibrillation   . PMR (polymyalgia rheumatica)   . Osteoarthritis, multiple sites   . Urge incontinence   . Adenomatous colon polyp   . Rheumatic fever   . Degenerative disc disease     cervical and lumbar  . Arrhythmia     A-Fib    Past Surgical History  Procedure Laterality Date  . Tonsillectomy and adenoidectomy    . Meningitis  1963  . Abdominal hysterectomy    . Breast biopsy Left 2010     negative  . Ankle fracture surgery Left 1996  . Synovectomy Right 1973    elbow  . Spine surgery    . Dexa  11/09/06    normal    Family History  Problem Relation Age of Onset  . Schizophrenia Mother     paranoid  . Heart disease Mother     from psych meds  . Arthritis Mother   . Rheum arthritis Mother   . Cancer Father     prostate  . Arthritis Father   . Diabetes Father     History   Social History  . Marital Status: Married    Spouse Name: N/A    Number of Children: 1  . Years of Education: N/A   Occupational History  . 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 Smoking status: Never Smoker   . Smokeless tobacco: Never Used  . Alcohol Use: Yes  . Drug Use: No  . Sexual Activity: Not on file   Other Topics Concern  . Not on file   Social History Narrative   Retired 2007    1 son in [place] area   Spends part time in [place]      Has living will   Husband then son is health care POA   Not sure about DNR---will keep open resuscitation for now    No tube feedings if cognitively unaware                  Review of Systems Known sleep apnea in past. Generally awakens refreshed. Occasional afternoon nap. Couldn't tolerate the CPAP in the past Has not needed the alprazolam lately---will take 1/4 rarely Appetite is okay Weight is stable or up a few pounds--plans to attend to this Bowels are okay. Some issues with IBS and bad odor. Has rash on abdomen-- no pain but blisters and itches     Objective:   Physical Exam  Constitutional: She is oriented to person, place, and time. She appears well-developed and well-nourished. No distress.  HENT:  Mouth/Throat: Oropharynx is clear and moist. No oropharyngeal exudate.  Neck: Normal range of motion. Neck supple. No thyromegaly present.  Cardiovascular: Normal rate, regular rhythm, normal heart sounds and intact distal pulses.  Exam  reveals no gallop.   No murmur heard. Pulmonary/Chest: Effort normal and breath sounds normal. No respiratory distress. She has no wheezes. She has no rales.  Abdominal: Soft. There is no tenderness.  Musculoskeletal: She  exhibits no edema and no tenderness.  Lymphadenopathy:    She has no cervical adenopathy.  Neurological: She is alert and oriented to person, place, and time.  President-- "[place], [place]" [phone] [id number] Recall 2/3  Skin: Rash noted.  Slight fungal intertrigo under lower abdominal fold--will give nystatin powder  Psychiatric: She has a normal mood and affect. Her behavior is normal.          Assessment & Plan:

## 2014-10-10 NOTE — Assessment & Plan Note (Signed)
Few paroxysms Happy with her 2 drug regimen for treatment Continues on xarelto

## 2014-10-10 NOTE — Assessment & Plan Note (Signed)
See social history 

## 2014-10-10 NOTE — Assessment & Plan Note (Signed)
No symptoms Just check ESR

## 2014-10-10 NOTE — Telephone Encounter (Signed)
Pt dropped off form for insurance. Pt had previously dropped off same form but had filled out [employer] [facility]. It wouldn't let me attach to old phone note. Sorry! Thanks [employer]

## 2014-10-10 NOTE — Assessment & Plan Note (Signed)
Discussed primary prevention For now, will continue

## 2014-10-10 NOTE — Progress Notes (Signed)
Pre visit review using our clinic review tool, if applicable. No additional management support is needed unless otherwise documented below in the visit note. 

## 2014-10-11 ENCOUNTER — Encounter: Payer: Self-pay | Admitting: Internal Medicine

## 2014-10-11 ENCOUNTER — Telehealth: Payer: Self-pay | Admitting: Internal Medicine

## 2014-10-11 NOTE — Telephone Encounter (Signed)
emmi emailed °

## 2014-10-29 ENCOUNTER — Other Ambulatory Visit: Payer: Self-pay | Admitting: Cardiovascular Disease

## 2014-10-29 ENCOUNTER — Other Ambulatory Visit: Payer: Self-pay | Admitting: Internal Medicine

## 2014-11-02 ENCOUNTER — Other Ambulatory Visit: Payer: Self-pay | Admitting: Internal Medicine

## 2014-11-02 DIAGNOSIS — E538 Deficiency of other specified B group vitamins: Secondary | ICD-10-CM

## 2014-11-07 ENCOUNTER — Other Ambulatory Visit (INDEPENDENT_AMBULATORY_CARE_PROVIDER_SITE_OTHER): Payer: Medicare PPO

## 2014-11-07 DIAGNOSIS — E538 Deficiency of other specified B group vitamins: Secondary | ICD-10-CM

## 2014-11-07 LAB — VITAMIN B12: Vitamin B-12: 138 pg/mL — ABNORMAL LOW (ref 211–911)

## 2014-11-15 ENCOUNTER — Ambulatory Visit: Payer: Medicare PPO

## 2014-11-20 ENCOUNTER — Ambulatory Visit (INDEPENDENT_AMBULATORY_CARE_PROVIDER_SITE_OTHER): Payer: Medicare PPO | Admitting: *Deleted

## 2014-11-20 DIAGNOSIS — E538 Deficiency of other specified B group vitamins: Secondary | ICD-10-CM

## 2014-11-20 MED ORDER — CYANOCOBALAMIN 1000 MCG/ML IJ SOLN
1000.0000 ug | Freq: Once | INTRAMUSCULAR | Status: AC
  Administered 2014-11-20: 1000 ug via INTRAMUSCULAR

## 2014-11-29 ENCOUNTER — Encounter: Payer: Self-pay | Admitting: Internal Medicine

## 2014-11-30 NOTE — Telephone Encounter (Signed)
Yes, we just have to fax the order

## 2015-02-22 ENCOUNTER — Other Ambulatory Visit: Payer: Self-pay | Admitting: Cardiovascular Disease

## 2015-03-09 ENCOUNTER — Telehealth: Payer: Self-pay | Admitting: Internal Medicine

## 2015-03-09 NOTE — Telephone Encounter (Signed)
Pt made my chart appointment for 04/11/15 for  Follow-up on Vitamin B12 level; rashes; low blood     levels    Is it ok to wait till then?

## 2015-03-12 NOTE — Telephone Encounter (Signed)
If pt feels fine then yes its ok.

## 2015-03-27 ENCOUNTER — Encounter: Payer: Self-pay | Admitting: Cardiovascular Disease

## 2015-03-27 ENCOUNTER — Ambulatory Visit (INDEPENDENT_AMBULATORY_CARE_PROVIDER_SITE_OTHER): Payer: Medicare PPO | Admitting: Cardiovascular Disease

## 2015-03-27 VITALS — BP 124/64 | HR 63 | Ht 66.0 in | Wt 181.8 lb

## 2015-03-27 DIAGNOSIS — E785 Hyperlipidemia, unspecified: Secondary | ICD-10-CM | POA: Diagnosis not present

## 2015-03-27 DIAGNOSIS — G4733 Obstructive sleep apnea (adult) (pediatric): Secondary | ICD-10-CM | POA: Diagnosis not present

## 2015-03-27 DIAGNOSIS — I4891 Unspecified atrial fibrillation: Secondary | ICD-10-CM

## 2015-03-27 DIAGNOSIS — I10 Essential (primary) hypertension: Secondary | ICD-10-CM | POA: Diagnosis not present

## 2015-03-27 DIAGNOSIS — Z7189 Other specified counseling: Secondary | ICD-10-CM

## 2015-03-27 MED ORDER — DILTIAZEM HCL 30 MG PO TABS: 30.0000 mg | ORAL_TABLET | Freq: Three times a day (TID) | ORAL | Status: DC | PRN

## 2015-03-27 MED ORDER — ROSUVASTATIN CALCIUM 5 MG PO TABS: 5.0000 mg | ORAL_TABLET | Freq: Every day | ORAL | Status: DC

## 2015-03-27 MED ORDER — METOPROLOL SUCCINATE ER 25 MG PO TB24: 25.0000 mg | ORAL_TABLET | Freq: Every day | ORAL | Status: DC

## 2015-03-27 MED ORDER — RIVAROXABAN 20 MG PO TABS: 20.0000 mg | ORAL_TABLET | Freq: Every day | ORAL | Status: DC

## 2015-03-27 MED ORDER — VALSARTAN-HYDROCHLOROTHIAZIDE 80-12.5 MG PO TABS: 1.0000 | ORAL_TABLET | Freq: Every day | ORAL | Status: DC

## 2015-03-27 NOTE — Assessment & Plan Note (Signed)
Blood pressure is well controlled on today's visit. No changes made to the medications. 

## 2015-03-27 NOTE — Assessment & Plan Note (Signed)
Cholesterol is at goal on the current lipid regimen. No changes to the medications were made.  

## 2015-03-27 NOTE — Assessment & Plan Note (Signed)
Rare episodes of atrial fibrillation, lasting only several hours at a time. No changes to her medications except for will increase the xarelto up to 20 mg daily. No renal dysfunction, GFR greater than 50

## 2015-03-27 NOTE — Progress Notes (Signed)
Patient ID: Jennifer Schmitt, female    DOB: [birth year], 80 y.o.   MRN: [id number]  HPI Comments: Jennifer Schmitt is a very pleasant 80 year old woman with long history of paroxysmal atrial fibrillation, obstructive sleep apnea who does not wear CPAP, hypertension, hyperlipidemia who presents for routine followup of her atrial fibrillation.  In follow-up, she reports that she has had possibly 2 episodes of atrial fibrillation in the past 6 months. She is tolerating anticoagulation. When she has an episode, she takes diltiazem one or 2 of the 30 mg pills. Arrhythmia typically resolves within 1-3 hours. Otherwise she reports that she feels well with no complaints She is concerned about some of the findings on her blood work including low vitamin B-12. By her report, this was supplemented and level was more than 2000 in follow-up. She is also concerned that the sedimentation rate was borderline elevated Other lab work including BMP, LFTs, CBC are essentially normal Prior total cholesterol measurements from the end of 2015 look excellent  EKG on today's visit shows normal sinus rhythm with rate 63 bpm, no significant ST or T-wave changes  Other past medical history She reports having an episode of atrial fibrillation in August 2015 It lasted approximately 6 hours. She took diltiazem and propranolol.  she converted back to normal sinus rhythm Some restless nights, possibly from sleep apnea. History of headaches  Previously reported palpitations at night, very short-lived  In general she feels well, is active. She does not do regular exercise regimen. She spends much of her winter in [place], has a cardiologist there  Allergies  Allergen Reactions  . Actonel [Risedronate Sodium]   . Boniva [Ibandronic Acid]   . Fosamax [Alendronate Sodium]   . Latex   . Lipitor [Atorvastatin]   . Niaspan [Niacin Er]     Rash & swelling   . Talwin [Pentazocine]     Outpatient Encounter Prescriptions as of  03/27/2015  Medication Sig  . acetaminophen (TYLENOL) 500 MG tablet Take 1,000 mg by mouth every 4 (four) hours as needed.   . ALPRAZolam (XANAX) 0.25 MG tablet Take 0.25 mg by mouth at bedtime as needed for anxiety.  . ARTIFICIAL TEAR OP Apply to eye as needed.  . cetirizine (ZYRTEC) 10 MG tablet Take 10 mg by mouth daily.  . Cholecalciferol (VITAMIN D) 2000 UNITS CAPS Take 1 capsule by mouth daily.  . clobetasol cream (TEMOVATE) 2.69 % Apply 1 application topically as needed.   . Cyanocobalamin (VITAMIN B 12 PO) Take 2,000 Units by mouth daily.  Marland Kitchen desonide (DESOWEN) 0.05 % lotion Apply 1 application topically as needed.   . diltiazem (CARDIZEM) 30 MG tablet Take 1 tablet (30 mg total) by mouth 3 (three) times daily as needed (breakthrough atrial fibrillation).  . fluticasone (FLONASE) 50 MCG/ACT nasal spray Place 1 spray into both nostrils daily.  . folic acid (FOLVITE) 1 MG tablet TAKE 1 TABLET EVERY DAY  . ketotifen (ZADITOR) 0.025 % ophthalmic solution Place 1 drop into both eyes daily.  . metoprolol succinate (TOPROL-XL) 25 MG 24 hr tablet Take 1 tablet (25 mg total) by mouth daily.  Marland Kitchen nystatin (MYCOSTATIN/NYSTOP) 100000 UNIT/GM POWD Apply 1 g topically 2 (two) times daily as needed.  . Probiotic Product (PROBIOTIC DAILY PO) Take 1 capsule by mouth daily.   . propranolol (INDERAL) 10 MG tablet Take 1 tablet (10 mg total) by mouth 3 (three) times daily as needed (breakthrough atrial fibrillation).  . rivaroxaban (XARELTO) 20 MG TABS tablet Take 1  tablet (20 mg total) by mouth daily with supper.  . rosuvastatin (CRESTOR) 5 MG tablet Take 1 tablet (5 mg total) by mouth daily.  . valsartan-hydrochlorothiazide (DIOVAN-HCT) 80-12.5 MG per tablet Take 1 tablet by mouth daily.  . [DISCONTINUED] CRESTOR 5 MG tablet TAKE 1 TABLET EVERY DAY  . [DISCONTINUED] diltiazem (CARDIZEM) 30 MG tablet Take 1 tablet (30 mg total) by mouth 3 (three) times daily as needed (breakthrough atrial fibrillation).  .  [DISCONTINUED] metoprolol succinate (TOPROL-XL) 25 MG 24 hr tablet Take 1 tablet (25 mg total) by mouth daily.  . [DISCONTINUED] valsartan-hydrochlorothiazide (DIOVAN-HCT) 80-12.5 MG per tablet TAKE 1 TABLET EVERY DAY  . [DISCONTINUED] XARELTO 15 MG TABS tablet TAKE 1 TABLET (15 MG TOTAL) BY MOUTH DAILY.  . [DISCONTINUED] omeprazole (PRILOSEC) 20 MG capsule Take 20 mg by mouth daily.  . [DISCONTINUED] Simethicone (GAS-X EXTRA STRENGTH) 125 MG CAPS Take by mouth daily.    Past Medical History  Diagnosis Date  . Hypertension   . Hyperlipidemia   . GERD (gastroesophageal reflux disease)   . Allergic rhinitis due to pollen   . Atrial fibrillation   . PMR (polymyalgia rheumatica)   . Osteoarthritis, multiple sites   . Urge incontinence   . Adenomatous colon polyp   . Rheumatic fever   . Degenerative disc disease     cervical and lumbar  . Arrhythmia     A-Fib    Past Surgical History  Procedure Laterality Date  . Tonsillectomy and adenoidectomy    . Meningitis  1963  . Abdominal hysterectomy    . Breast biopsy Left 2010    negative  . Ankle fracture surgery Left 1996  . Synovectomy Right 1973    elbow  . Spine surgery    . Dexa  11/09/06    normal    Social History  reports that she has never smoked. She has never used smokeless tobacco. She reports that she drinks alcohol. She reports that she does not use illicit drugs.  Family History family history includes Arthritis in her father and mother; Cancer in her father; Diabetes in her father; Heart disease in her mother; Rheum arthritis in her mother; Schizophrenia in her mother.        Review of Systems  Constitutional: Negative.   Respiratory: Negative.   Cardiovascular: Negative.   Gastrointestinal: Negative.   Musculoskeletal: Negative.   Neurological: Negative.   Hematological: Negative.   Psychiatric/Behavioral: Negative.   All other systems reviewed and are negative.   BP 124/64 mmHg  Pulse 63  Ht 5'  6" (1.676 m)  Wt 181 lb 12 oz (82.441 kg)  BMI 29.35 kg/m2  Physical Exam  Constitutional: She is oriented to person, place, and time. She appears well-developed and well-nourished.  HENT:  Head: Normocephalic.  Nose: Nose normal.  Mouth/Throat: Oropharynx is clear and moist.  Eyes: Conjunctivae are normal. Pupils are equal, round, and reactive to light.  Neck: Normal range of motion. Neck supple. No JVD present.  Cardiovascular: Normal rate, regular rhythm, S1 normal, S2 normal, normal heart sounds and intact distal pulses.  Exam reveals no gallop and no friction rub.   No murmur heard. Pulmonary/Chest: Effort normal and breath sounds normal. No respiratory distress. She has no wheezes. She has no rales. She exhibits no tenderness.  Abdominal: Soft. Bowel sounds are normal. She exhibits no distension. There is no tenderness.  Musculoskeletal: Normal range of motion. She exhibits no edema or tenderness.  Lymphadenopathy:    She has no  cervical adenopathy.  Neurological: She is alert and oriented to person, place, and time. Coordination normal.  Skin: Skin is warm and dry. No rash noted. No erythema.  Psychiatric: She has a normal mood and affect. Her behavior is normal. Judgment and thought content normal.    Assessment and Plan  Nursing note and vitals reviewed.

## 2015-03-27 NOTE — Patient Instructions (Addendum)
You are doing well.  Please increase the xarelto to 20 mg daily  Please call us if you have new issues that need to be addressed before your next appt.  Your physician wants you to follow-up in: 6 months.  You will receive a reminder letter in the mail two months in advance. If you don't receive a letter, please call our office to schedule the follow-up appointment.

## 2015-03-27 NOTE — Assessment & Plan Note (Addendum)
Previous history of snoring.  Possibly contributing to her symptoms of atrial fibrillation. She does not want CPAP, as mentioned in the past

## 2015-03-27 NOTE — Assessment & Plan Note (Signed)
Long discussion about anticoagulation, various options, other medications. After discussion, she prefers to stay on xarelto. We'll increase the dose up to 20 mg daily as she has normal renal function, continues to have paroxysmal arrhythmia

## 2015-04-04 ENCOUNTER — Other Ambulatory Visit: Payer: Self-pay | Admitting: Internal Medicine

## 2015-04-11 ENCOUNTER — Encounter: Payer: Self-pay | Admitting: Internal Medicine

## 2015-04-11 ENCOUNTER — Ambulatory Visit (INDEPENDENT_AMBULATORY_CARE_PROVIDER_SITE_OTHER): Payer: Medicare PPO | Admitting: Internal Medicine

## 2015-04-11 VITALS — BP 128/68 | HR 66 | Temp 97.5°F | Wt 181.0 lb

## 2015-04-11 DIAGNOSIS — F39 Unspecified mood [affective] disorder: Secondary | ICD-10-CM | POA: Diagnosis not present

## 2015-04-11 DIAGNOSIS — I480 Paroxysmal atrial fibrillation: Secondary | ICD-10-CM

## 2015-04-11 DIAGNOSIS — D510 Vitamin B12 deficiency anemia due to intrinsic factor deficiency: Secondary | ICD-10-CM | POA: Diagnosis not present

## 2015-04-11 MED ORDER — ALPRAZOLAM 0.25 MG PO TABS: 0.2500 mg | ORAL_TABLET | Freq: Every day | ORAL | Status: DC | PRN

## 2015-04-11 MED ORDER — CYANOCOBALAMIN 1000 MCG/ML IJ SOLN
1000.0000 ug | Freq: Once | INTRAMUSCULAR | Status: AC
  Administered 2015-04-11: 1000 ug via INTRAMUSCULAR

## 2015-04-11 NOTE — Progress Notes (Signed)
Subjective:    Patient ID: Jennifer Schmitt, female    DOB: 04-09-1935, 80 y.o.   MRN: [id number]  HPI Back from [place]--had a good winter B12 back in normal range with the injections  The numbness in fingers and toes is better Feels better overall Also using sublingual B12  Still gets rare anxiety and upset Mind will race at nights at times Has the alprazolam for just in case  Still feels missed beats frequently Not irregular though  Current Outpatient Prescriptions on File Prior to Visit  Medication Sig Dispense Refill  . acetaminophen (TYLENOL) 500 MG tablet Take 1,000 mg by mouth every 4 (four) hours as needed.     . ALPRAZolam (XANAX) 0.25 MG tablet Take 0.25 mg by mouth at bedtime as needed for anxiety.    . ARTIFICIAL TEAR OP Apply to eye as needed.    . cetirizine (ZYRTEC) 10 MG tablet Take 10 mg by mouth daily.    . Cholecalciferol (VITAMIN D) 2000 UNITS CAPS Take 1 capsule by mouth daily.    . clobetasol cream (TEMOVATE) 1.60 % Apply 1 application topically as needed.     . Cyanocobalamin (VITAMIN B 12 PO) Take 2,000 Units by mouth daily.    Marland Kitchen desonide (DESOWEN) 0.05 % lotion Apply 1 application topically as needed.     . diltiazem (CARDIZEM) 30 MG tablet Take 1 tablet (30 mg total) by mouth 3 (three) times daily as needed (breakthrough atrial fibrillation). 90 tablet 3  . fluticasone (FLONASE) 50 MCG/ACT nasal spray Place 1 spray into both nostrils daily.    . folic acid (FOLVITE) 1 MG tablet TAKE 1 TABLET EVERY DAY 90 tablet 1  . ketotifen (ZADITOR) 0.025 % ophthalmic solution Place 1 drop into both eyes daily.    . metoprolol succinate (TOPROL-XL) 25 MG 24 hr tablet Take 1 tablet (25 mg total) by mouth daily. 90 tablet 3  . nystatin (MYCOSTATIN/NYSTOP) 100000 UNIT/GM POWD Apply 1 g topically 2 (two) times daily as needed. 60 g 5  . Probiotic Product (PROBIOTIC DAILY PO) Take 1 capsule by mouth daily.     . propranolol (INDERAL) 10 MG tablet Take 1 tablet (10 mg  total) by mouth 3 (three) times daily as needed (breakthrough atrial fibrillation). 90 tablet 3  . rivaroxaban (XARELTO) 20 MG TABS tablet Take 1 tablet (20 mg total) by mouth daily with supper. 90 tablet 3  . rosuvastatin (CRESTOR) 5 MG tablet Take 1 tablet (5 mg total) by mouth daily. 90 tablet 3  . valsartan-hydrochlorothiazide (DIOVAN-HCT) 80-12.5 MG per tablet Take 1 tablet by mouth daily. 90 tablet 3   No current facility-administered medications on file prior to visit.    Allergies  Allergen Reactions  . Actonel [Risedronate Sodium]   . Boniva [Ibandronic Acid]   . Fosamax [Alendronate Sodium]   . Latex   . Lipitor [Atorvastatin]   . Niaspan [Niacin Er]     Rash & swelling   . Talwin [Pentazocine]     Past Medical History  Diagnosis Date  . Hypertension   . Hyperlipidemia   . GERD (gastroesophageal reflux disease)   . Allergic rhinitis due to pollen   . Atrial fibrillation   . PMR (polymyalgia rheumatica)   . Osteoarthritis, multiple sites   . Urge incontinence   . Adenomatous colon polyp   . Rheumatic fever   . Degenerative disc disease     cervical and lumbar  . Arrhythmia     A-Fib  Past Surgical History  Procedure Laterality Date  . Tonsillectomy and adenoidectomy    . Meningitis  1963  . Abdominal hysterectomy    . Breast biopsy Left 2010    negative  . Ankle fracture surgery Left 1996  . Synovectomy Right 1973    elbow  . Spine surgery    . Dexa  11/09/06    normal    Family History  Problem Relation Age of Onset  . Schizophrenia Mother     paranoid  . Heart disease Mother     from psych meds  . Arthritis Mother   . Rheum arthritis Mother   . Cancer Father     prostate  . Arthritis Father   . Diabetes Father     History   Social History  . Marital Status: Married    Spouse Name: N/A  . Number of Children: 1  . Years of Education: N/A   Occupational History  . 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 Smoking status: Never Smoker   . Smokeless tobacco: Never Used  . Alcohol Use: Yes  . Drug Use: No  . Sexual Activity: Not on file   Other Topics Concern  . Not on file   Social History Narrative   Retired 2007    1 son in [place] area   Spends part time in [place]      Has living will   Husband then son is health care POA   Not sure about DNR---will keep open resuscitation for now    No tube feedings if cognitively unaware                  Review of Systems Sleeps okay---chronic restless sleeper Appetite is fine Weight up slightly from last year    Objective:   Physical Exam  Constitutional: She appears well-developed and well-nourished. No distress.  Cardiovascular: Normal rate, regular rhythm and normal heart sounds.  Exam reveals no gallop.   No murmur heard. Pulmonary/Chest: Effort normal and breath sounds normal. No respiratory distress. She has no wheezes. She has no rales.  Psychiatric: She has a normal mood and affect. Her behavior is normal.          Assessment & Plan:

## 2015-04-11 NOTE — Assessment & Plan Note (Signed)
B12 back in normal range with the injections

## 2015-04-11 NOTE — Addendum Note (Signed)
Addended by: Despina Hidden on: 04/11/2015 12:08 PM   Modules accepted: Orders

## 2015-04-11 NOTE — Assessment & Plan Note (Signed)
Has spells of skipping beats but unsure if this is atrial fib She will take extra diltiazem at times

## 2015-04-11 NOTE — Assessment & Plan Note (Signed)
Has rare insomnia and anxiety Will refill her alprazolam

## 2015-04-11 NOTE — Progress Notes (Signed)
Pre visit review using our clinic review tool, if applicable. No additional management support is needed unless otherwise documented below in the visit note. 

## 2015-05-16 ENCOUNTER — Encounter: Payer: Self-pay | Admitting: Primary Care

## 2015-05-16 ENCOUNTER — Ambulatory Visit: Payer: Medicare PPO

## 2015-05-16 ENCOUNTER — Ambulatory Visit (INDEPENDENT_AMBULATORY_CARE_PROVIDER_SITE_OTHER): Payer: Medicare PPO | Admitting: Primary Care

## 2015-05-16 VITALS — BP 120/64 | HR 81 | Temp 98.1°F | Ht 66.0 in | Wt 182.0 lb

## 2015-05-16 DIAGNOSIS — L989 Disorder of the skin and subcutaneous tissue, unspecified: Secondary | ICD-10-CM

## 2015-05-16 DIAGNOSIS — E538 Deficiency of other specified B group vitamins: Secondary | ICD-10-CM | POA: Diagnosis not present

## 2015-05-16 DIAGNOSIS — R238 Other skin changes: Secondary | ICD-10-CM

## 2015-05-16 MED ORDER — CYANOCOBALAMIN 1000 MCG/ML IJ SOLN
1000.0000 ug | Freq: Once | INTRAMUSCULAR | Status: AC
  Administered 2015-05-16: 1000 ug via INTRAMUSCULAR

## 2015-05-16 MED ORDER — NYSTATIN 100000 UNIT/GM EX CREA: 1.0000 "application " | TOPICAL_CREAM | Freq: Two times a day (BID) | CUTANEOUS | Status: DC

## 2015-05-16 NOTE — Patient Instructions (Signed)
Try Nystatin Cream. Apply twice daily to affected areas until healed. Once healed you will need to apply a barrier cream daily for prevention of skin breakdown. This may be Desitin, etc. Follow up if no improvement or if skin becomes infected. It was nice meeting you!

## 2015-05-16 NOTE — Progress Notes (Signed)
Subjective:    Patient ID: Jennifer Schmitt, female    DOB: 1935-08-16, 80 y.o.   MRN: [id number]  HPI  Jennifer Schmitt is a 80 year old female who presents today with a chief complaint of skin irritation. She has a history of rashes and skin irritation to the abdominal and groin regions intermittently since October 2015. She was provided with a script for Nystatin powder in October 2015, so she used some of the powder which will cause burning to the abdominal and groin folds. She's tried OTC antifungal cream, A&D ointment, OTC Desitin, OTC triple antibiotic ointment for recent breakout without help. She showers daily and is keeping wounds clean. She uses Dove soap for sensitive skin when bathing. Denies bleeding, drainage, discharge.  Review of Systems  Constitutional: Negative for fever and chills.  Skin: Positive for rash.       Past Medical History  Diagnosis Date  . Hypertension   . Hyperlipidemia   . GERD (gastroesophageal reflux disease)   . Allergic rhinitis due to pollen   . Atrial fibrillation   . PMR (polymyalgia rheumatica)   . Osteoarthritis, multiple sites   . Urge incontinence   . Adenomatous colon polyp   . Rheumatic fever   . Degenerative disc disease     cervical and lumbar  . Arrhythmia     A-Fib    History   Social History  . Marital Status: Married    Spouse Name: N/A  . Number of Children: 1  . Years of Education: N/A   Occupational History  . 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 Smoking status: Never Smoker   . Smokeless tobacco: Never Used  . Alcohol Use: Yes  . Drug Use: No  . Sexual Activity: Not on file   Other Topics Concern  . Not on file   Social History Narrative   Retired 2007    1 son in [place] area   Spends part time in [place]      Has living will   Husband then son is health care POA   Not sure about DNR---will keep open resuscitation for now    No tube feedings if cognitively unaware                   Past Surgical History  Procedure Laterality Date  . Tonsillectomy and adenoidectomy    . Meningitis  1963  . Abdominal hysterectomy    . Breast biopsy Left 2010    negative  . Ankle fracture surgery Left 1996  . Synovectomy Right 1973    elbow  . Spine surgery    . Dexa  11/09/06    normal    Family History  Problem Relation Age of Onset  . Schizophrenia Mother     paranoid  . Heart disease Mother     from psych meds  . Arthritis Mother   . Rheum arthritis Mother   . Cancer Father     prostate  . Arthritis Father   . Diabetes Father     Allergies  Allergen Reactions  . Actonel [Risedronate Sodium]   . Boniva [Ibandronic Acid]   . Fosamax [Alendronate Sodium]   . Latex   . Lipitor [Atorvastatin]   . Niaspan [Niacin Er]     Rash & swelling   . Talwin [Pentazocine]     Current Outpatient Prescriptions on File Prior to Visit  Medication Sig Dispense Refill  . acetaminophen (TYLENOL) 500 MG tablet Take 1,000 mg by mouth every 4 (four) hours as needed.     . ALPRAZolam (XANAX) 0.25 MG tablet Take 1 tablet (0.25 mg total) by mouth daily as needed for anxiety or sleep. 30 tablet 0  . ARTIFICIAL TEAR OP Apply to eye as needed.    . cetirizine (ZYRTEC) 10 MG tablet Take 10 mg by mouth daily.    . Cholecalciferol (VITAMIN D) 2000 UNITS CAPS Take 1 capsule by mouth daily.    . clobetasol cream (TEMOVATE) 0.10 % Apply 1 application topically as needed.     . desonide (DESOWEN) 0.05 % lotion Apply 1 application topically as needed.     . diltiazem (CARDIZEM) 30 MG tablet Take 1 tablet (30 mg total) by mouth 3 (three) times daily as needed (breakthrough atrial fibrillation). 90 tablet 3  . fluticasone (FLONASE) 50 MCG/ACT nasal spray Place 1 spray into both nostrils daily.    . folic acid (FOLVITE) 1 MG tablet TAKE 1 TABLET EVERY DAY 90 tablet 1  . ketotifen (ZADITOR) 0.025 % ophthalmic solution Place 1 drop into  both eyes daily.    . metoprolol succinate (TOPROL-XL) 25 MG 24 hr tablet Take 1 tablet (25 mg total) by mouth daily. 90 tablet 3  . Probiotic Product (PROBIOTIC DAILY PO) Take 1 capsule by mouth daily.     . propranolol (INDERAL) 10 MG tablet Take 1 tablet (10 mg total) by mouth 3 (three) times daily as needed (breakthrough atrial fibrillation). 90 tablet 3  . rivaroxaban (XARELTO) 20 MG TABS tablet Take 1 tablet (20 mg total) by mouth daily with supper. 90 tablet 3  . rosuvastatin (CRESTOR) 5 MG tablet Take 1 tablet (5 mg total) by mouth daily. 90 tablet 3  . valsartan-hydrochlorothiazide (DIOVAN-HCT) 80-12.5 MG per tablet Take 1 tablet by mouth daily. 90 tablet 3  . Cyanocobalamin (VITAMIN B 12 PO) Take 2,000 Units by mouth daily.     No current facility-administered medications on file prior to visit.    BP 120/64 mmHg  Pulse 81  Temp(Src) 98.1 F (36.7 C) (Oral)  Ht 5\' 6"  (1.676 m)  Wt 182 lb (82.555 kg)  BMI 29.39 kg/m2  SpO2 98%    Objective:   Physical Exam  Constitutional: No distress.  Cardiovascular: Normal rate and regular rhythm.   Pulmonary/Chest: Effort normal and breath sounds normal.  Skin: Skin is warm and dry. Rash noted.  Mild irritation with redness under abdominal fold and to bilateral groin folds. Moderate amount of abdominal fat present. No bleeding, s/s of systemic infection.          Assessment & Plan:  Skin irritaiton:  Suspect fungal involvement due to appearance and reoccurance. Encouraged healthy diet for weight loss.  Nystatin powder irritating. Switch for Nystatin cream BID until healed. Suggested barrier cream daily for prevention once rash has healed.  Follow up PRN.

## 2015-05-16 NOTE — Progress Notes (Signed)
Pre visit review using our clinic review tool, if applicable. No additional management support is needed unless otherwise documented below in the visit note. 

## 2015-05-18 ENCOUNTER — Other Ambulatory Visit: Payer: Self-pay | Admitting: Primary Care

## 2015-05-18 NOTE — Telephone Encounter (Signed)
Received electronically refill request.   Rx was prescribed on 05/16/15. Note from pharmacy stated that patient lost original. Ok to refill?

## 2015-05-22 NOTE — Telephone Encounter (Signed)
Anda Kraft sent rx electronically on 05/18/15

## 2015-05-31 ENCOUNTER — Encounter: Payer: Self-pay | Admitting: Emergency Medicine

## 2015-05-31 ENCOUNTER — Emergency Department
Admission: EM | Admit: 2015-05-31 | Discharge: 2015-05-31 | Disposition: A | Discharge status: Home / Self Care | Payer: Medicare PPO | Attending: Emergency Medicine | Admitting: Emergency Medicine

## 2015-05-31 ENCOUNTER — Other Ambulatory Visit: Payer: Self-pay

## 2015-05-31 DIAGNOSIS — F419 Anxiety disorder, unspecified: Secondary | ICD-10-CM

## 2015-05-31 DIAGNOSIS — R197 Diarrhea, unspecified: Secondary | ICD-10-CM | POA: Diagnosis not present

## 2015-05-31 DIAGNOSIS — R112 Nausea with vomiting, unspecified: Secondary | ICD-10-CM | POA: Diagnosis not present

## 2015-05-31 DIAGNOSIS — R42 Dizziness and giddiness: Secondary | ICD-10-CM | POA: Insufficient documentation

## 2015-05-31 DIAGNOSIS — Z91040 Latex allergy status: Secondary | ICD-10-CM | POA: Diagnosis not present

## 2015-05-31 DIAGNOSIS — I10 Essential (primary) hypertension: Secondary | ICD-10-CM | POA: Diagnosis not present

## 2015-05-31 LAB — COMPREHENSIVE METABOLIC PANEL
ALT: 16 U/L (ref 14–54)
ANION GAP: 11 (ref 5–15)
AST: 22 U/L (ref 15–41)
Albumin: 4.1 g/dL (ref 3.5–5.0)
Alkaline Phosphatase: 57 U/L (ref 38–126)
BUN: 21 mg/dL — AB (ref 6–20)
CALCIUM: 9.2 mg/dL (ref 8.9–10.3)
CHLORIDE: 104 mmol/L (ref 101–111)
CO2: 25 mmol/L (ref 22–32)
Creatinine, Ser: 0.85 mg/dL (ref 0.44–1.00)
Glucose, Bld: 172 mg/dL — ABNORMAL HIGH (ref 65–99)
Potassium: 3.6 mmol/L (ref 3.5–5.1)
SODIUM: 140 mmol/L (ref 135–145)
TOTAL PROTEIN: 7.5 g/dL (ref 6.5–8.1)
Total Bilirubin: 0.8 mg/dL (ref 0.3–1.2)

## 2015-05-31 LAB — CBC WITH DIFFERENTIAL/PLATELET
BASOS PCT: 1 %
Basophils Absolute: 0 10*3/uL (ref 0–0.1)
EOS ABS: 0 10*3/uL (ref 0–0.7)
Eosinophils Relative: 1 %
HCT: 38 % (ref 35.0–47.0)
Hemoglobin: 12.8 g/dL (ref 12.0–16.0)
LYMPHS ABS: 1.1 10*3/uL (ref 1.0–3.6)
Lymphocytes Relative: 15 %
MCH: 32.2 pg (ref 26.0–34.0)
MCHC: 33.8 g/dL (ref 32.0–36.0)
MCV: 95.3 fL (ref 80.0–100.0)
MONOS PCT: 6 %
Monocytes Absolute: 0.5 10*3/uL (ref 0.2–0.9)
NEUTROS ABS: 5.5 10*3/uL (ref 1.4–6.5)
Neutrophils Relative %: 77 %
PLATELETS: 248 10*3/uL (ref 150–440)
RBC: 3.99 MIL/uL (ref 3.80–5.20)
RDW: 13.4 % (ref 11.5–14.5)
WBC: 7.1 10*3/uL (ref 3.6–11.0)

## 2015-05-31 LAB — TROPONIN I: Troponin I: <0.03 ng/mL (ref <0.031)

## 2015-05-31 MED ORDER — SODIUM CHLORIDE 0.9 % IV SOLN
1000.0000 mL | Freq: Once | INTRAVENOUS | Status: AC
  Administered 2015-05-31: 1000 mL via INTRAVENOUS

## 2015-05-31 MED ORDER — DIAZEPAM 2 MG PO TABS: 2.0000 mg | ORAL_TABLET | Freq: Three times a day (TID) | ORAL | Status: DC | PRN

## 2015-05-31 MED ORDER — DIAZEPAM 5 MG/ML IJ SOLN
2.5000 mg | Freq: Once | INTRAMUSCULAR | Status: AC
  Administered 2015-05-31: 2.5 mg via INTRAVENOUS

## 2015-05-31 MED ORDER — MECLIZINE HCL 25 MG PO TABS
ORAL_TABLET | ORAL | Status: AC
  Filled 2015-05-31: qty 2

## 2015-05-31 MED ORDER — ONDANSETRON HCL 4 MG/2ML IJ SOLN
INTRAMUSCULAR | Status: AC
  Administered 2015-05-31: 4 mg via INTRAVENOUS
  Filled 2015-05-31: qty 2

## 2015-05-31 MED ORDER — ONDANSETRON HCL 4 MG/2ML IJ SOLN
4.0000 mg | Freq: Once | INTRAMUSCULAR | Status: AC
  Administered 2015-05-31: 4 mg via INTRAVENOUS

## 2015-05-31 MED ORDER — MECLIZINE HCL 25 MG PO TABS: 50.0000 mg | ORAL_TABLET | Freq: Once | ORAL | Status: DC

## 2015-05-31 MED ORDER — MECLIZINE HCL 25 MG PO TABS
25.0000 mg | ORAL_TABLET | Freq: Three times a day (TID) | ORAL | Status: DC | PRN
Start: 2015-05-31 — End: 2018-09-14

## 2015-05-31 MED ORDER — DIAZEPAM 5 MG/ML IJ SOLN
INTRAMUSCULAR | Status: AC
  Filled 2015-05-31: qty 2

## 2015-05-31 NOTE — ED Notes (Signed)
MD at bedside. Pt not wanting to take meclizine at this time

## 2015-05-31 NOTE — ED Notes (Signed)
Vital signs stable. 

## 2015-05-31 NOTE — ED Notes (Signed)
Patient is resting comfortably. 

## 2015-05-31 NOTE — ED Notes (Signed)
Pt awaiting clothes to be brought back via husband for discharge.

## 2015-05-31 NOTE — ED Notes (Signed)
Family at bedside. 

## 2015-05-31 NOTE — ED Provider Notes (Addendum)
[facility] Emergency Department Provider Note     Time seen: ----------------------------------------- 10:47 AM on 05/31/2015 -----------------------------------------    I have reviewed the triage vital signs and the nursing notes.   HISTORY  Chief Complaint Nausea and Emesis    HPI Jennifer Schmitt is a 80 y.o. female who woke this morning after eating breakfast and began with nausea vomiting diarrhea. Patient also complaining of severe vertigo room spinning sensation. She is intense nausea and vomiting or denies any fevers chills other complaints, has had this happen before. Doesn't currently severe, worse with any movement, associated with vomiting.   Past Medical History  Diagnosis Date  . Hypertension   . Hyperlipidemia   . GERD (gastroesophageal reflux disease)   . Allergic rhinitis due to pollen   . Atrial fibrillation   . PMR (polymyalgia rheumatica)   . Osteoarthritis, multiple sites   . Urge incontinence   . Adenomatous colon polyp   . Rheumatic fever   . Degenerative disc disease     cervical and lumbar  . Arrhythmia     A-Fib    Patient Active Problem List   Diagnosis Date Noted  . Pernicious anemia 04/11/2015  . Mild mood disorder 04/11/2015  . Encounter for anticoagulation discussion and counseling 03/27/2015  . Visit for preventive health examination 10/10/2014  . Advanced directives, counseling/discussion 10/10/2014  . Allergic sinusitis 09/14/2014  . Obstructive sleep apnea 04/20/2014  . Hypertension   . Hyperlipidemia   . GERD (gastroesophageal reflux disease)   . Allergic rhinitis due to pollen   . Atrial fibrillation   . PMR (polymyalgia rheumatica)   . Osteoarthritis, multiple sites   . Degenerative disc disease     Past Surgical History  Procedure Laterality Date  . Tonsillectomy and adenoidectomy    . Meningitis  1963  . Abdominal hysterectomy    . Breast biopsy Left 2010    negative  . Ankle fracture  surgery Left 1996  . Synovectomy Right 1973    elbow  . Spine surgery    . Dexa  11/09/06    normal    Allergies Actonel; Boniva; Fosamax; Latex; Lipitor; Niaspan; and Talwin  Social History History  Substance Use Topics  . Smoking status: Never Smoker   . Smokeless tobacco: Never Used  . Alcohol Use: Yes    Review of Systems Constitutional: Negative for fever. Eyes: Negative for visual changes. ENT: Negative for sore throat. Cardiovascular: Negative for chest pain. Respiratory: Negative for shortness of breath. Gastrointestinal: Negative for abdominal pain, positive for vomiting and diarrhea Genitourinary: Negative for dysuria. Musculoskeletal: Negative for back pain. Skin: Negative for rash. Neurological: Negative for headaches, positive for vertigo  10-point ROS otherwise negative.  ____________________________________________   PHYSICAL EXAM:  VITAL SIGNS: ED Triage Vitals  Enc Vitals Group     BP --      Pulse --      Resp --      Temp --      Temp src --      SpO2 --      Weight --      Height --      Head Cir --      Peak Flow --      Pain Score --      Pain Loc --      Pain Edu? --      Excl. in [place]? --     Constitutional: Alert and oriented. Mild distress Eyes: Conjunctivae are  normal. PERRL. Normal extraocular movements. ENT   Head: Normocephalic and atraumatic.   Nose: No congestion/rhinnorhea.   Mouth/Throat: Mucous membranes are moist.   Neck: No stridor. Hematological/Lymphatic/Immunilogical: No cervical lymphadenopathy. Cardiovascular: Normal rate, regular rhythm. Normal and symmetric distal pulses are present in all extremities. No murmurs, rubs, or gallops. Respiratory: Normal respiratory effort without tachypnea nor retractions. Breath sounds are clear and equal bilaterally. No wheezes/rales/rhonchi. Gastrointestinal: Soft and nontender. No distention. No abdominal bruits. There is no CVA tenderness. Musculoskeletal:  Nontender with normal range of motion in all extremities. No joint effusions.  No lower extremity tenderness nor edema. Neurologic:  Normal speech and language. No gross focal neurologic deficits are appreciated. Speech is normal. No gait instability. Intense nausea with any movement. Skin:  Skin is warm, dry and intact. No rash noted.  ____________________________________________  EKG: Interpreted by me, sinus bradycardia with rate of 57, borderline prolonged PR interval, QRS with normal. No evidence of ST elevation or hypertrophy.  ED COURSE:  Pertinent labs & imaging results that were available during my care of the patient were reviewed by me and considered in my medical decision making (see chart for details). Patient is as acute severe vertigo, she'll be given IV fluid, Valium, antiemetics and meclizine. ____________________________________________    LABS (pertinent positives/negatives)  Labs Reviewed  COMPREHENSIVE METABOLIC PANEL - Abnormal; Notable for the following:    Glucose, Bld 172 (*)    BUN 21 (*)    All other components within normal limits  CBC WITH DIFFERENTIAL/PLATELET  TROPONIN I  URINALYSIS COMPLETEWITH MICROSCOPIC ([facility] ONLY)    ____________________________________________  FINAL ASSESSMENT AND PLAN  Vertigo, anxiety, vomiting   Plan: Patient declined Valium here, but was given other medications. Patient felt much better, likely combination of vertigo and anxiety. She did eventually take the Valium here, will continue home with meclizine and Valium to take for vertigo. This will also help with her anxiety. May discharge her with Zofran as well usually if the vertigo is uncontrollably nausea will improve. She is stable for outpatient follow-up   Earleen Newport, MD   Earleen Newport, MD 05/31/15 [place], MD 05/31/15 [phone]

## 2015-05-31 NOTE — ED Notes (Addendum)
Pt woke up this AM and after eating breakfast began with NVD.  Multiple episode of vomiting and diarrhea.  Has severe dizziness especially with change position.  Pressure in ears as well.

## 2015-05-31 NOTE — ED Notes (Signed)
sats decreased after valium given. Pt arouses easily.  Nasal cannula placed on pt. Will continue to monitor. sats WNL   On [place]

## 2015-05-31 NOTE — Discharge Instructions (Signed)
Vertigo Vertigo means you feel like you are moving when you are not. Vertigo can make you feel like things around you are moving when they are not. This problem often goes away on its own.  HOME CARE   Follow your doctor's instructions.  Avoid driving.  Avoid using heavy machinery.  Avoid doing any activity that could be dangerous if you have a vertigo attack.  Tell your doctor if a medicine seems to cause your vertigo. GET HELP RIGHT AWAY IF:   Your medicines do not help or make you feel worse.  You have trouble talking or walking.  You feel weak or have trouble using your arms, hands, or legs.  You have bad headaches.  You keep feeling sick to your stomach (nauseous) or throwing up (vomiting).  Your vision changes.  A family member notices changes in your behavior.  Your problems get worse. MAKE SURE YOU:  Understand these instructions.  Will watch your condition.  Will get help right away if you are not doing well or get worse. Document Released: 09/16/2008 Document Revised: 03/01/2012 Document Reviewed: 06/26/2011 [facility] Patient Information 2015 [place], [place]. This information is not intended to replace advice given to you by your health care provider. Make sure you discuss any questions you have with your health care provider.  Dizziness Dizziness is a common problem. It is a feeling of unsteadiness or light-headedness. You may feel like you are about to faint. Dizziness can lead to injury if you stumble or fall. A person of any age group can suffer from dizziness, but dizziness is more common in older adults. CAUSES  Dizziness can be caused by many different things, including:  Middle ear problems.  Standing for too long.  Infections.  An allergic reaction.  Aging.  An emotional response to something, such as the sight of blood.  Side effects of medicines.  Tiredness.  Problems with circulation or blood pressure.  Excessive use of alcohol or  medicines, or illegal drug use.  Breathing too fast (hyperventilation).  An irregular heart rhythm (arrhythmia).  A low red blood cell count (anemia).  Pregnancy.  Vomiting, diarrhea, fever, or other illnesses that cause body fluid loss (dehydration).  Diseases or conditions such as Parkinson's disease, high blood pressure (hypertension), diabetes, and thyroid problems.  Exposure to extreme heat. DIAGNOSIS  Your health care provider will ask about your symptoms, perform a physical exam, and perform an electrocardiogram (ECG) to record the electrical activity of your heart. Your health care provider may also perform other heart or blood tests to determine the cause of your dizziness. These may include:  Transthoracic echocardiogram (TTE). During echocardiography, sound waves are used to evaluate how blood flows through your heart.  Transesophageal echocardiogram (TEE).  Cardiac monitoring. This allows your health care provider to monitor your heart rate and rhythm in real time.  Holter monitor. This is a portable device that records your heartbeat and can help diagnose heart arrhythmias. It allows your health care provider to track your heart activity for several days if needed.  Stress tests by exercise or by giving medicine that makes the heart beat faster. TREATMENT  Treatment of dizziness depends on the cause of your symptoms and can vary greatly. HOME CARE INSTRUCTIONS   Drink enough fluids to keep your urine clear or pale yellow. This is especially important in very hot weather. In older adults, it is also important in cold weather.  Take your medicine exactly as directed if your dizziness is caused by  medicines. When taking blood pressure medicines, it is especially important to get up slowly.  Rise slowly from chairs and steady yourself until you feel okay.  In the morning, first sit up on the side of the bed. When you feel okay, stand slowly while holding onto something  until you know your balance is fine.  Move your legs often if you need to stand in one place for a long time. Tighten and relax your muscles in your legs while standing.  Have someone stay with you for 1-2 days if dizziness continues to be a problem. Do this until you feel you are well enough to stay alone. Have the person call your health care provider if he or she notices changes in you that are concerning.  Do not drive or use heavy machinery if you feel dizzy.  Do not drink alcohol. SEEK IMMEDIATE MEDICAL CARE IF:   Your dizziness or light-headedness gets worse.  You feel nauseous or vomit.  You have problems talking, walking, or using your arms, hands, or legs.  You feel weak.  You are not thinking clearly or you have trouble forming sentences. It may take a friend or family member to notice this.  You have chest pain, abdominal pain, shortness of breath, or sweating.  Your vision changes.  You notice any bleeding.  You have side effects from medicine that seems to be getting worse rather than better. MAKE SURE YOU:   Understand these instructions.  Will watch your condition.  Will get help right away if you are not doing well or get worse. Document Released: 06/03/2001 Document Revised: 12/13/2013 Document Reviewed: 06/27/2011 [facility] Patient Information 2015 [place], [place]. This information is not intended to replace advice given to you by your health care provider. Make sure you discuss any questions you have with your health care provider.

## 2015-05-31 NOTE — ED Notes (Signed)
Pt stated she doesn't feel right. RN notified.

## 2015-06-05 ENCOUNTER — Encounter: Payer: Self-pay | Admitting: Internal Medicine

## 2015-06-05 ENCOUNTER — Ambulatory Visit (INDEPENDENT_AMBULATORY_CARE_PROVIDER_SITE_OTHER): Payer: Medicare PPO | Admitting: Internal Medicine

## 2015-06-05 VITALS — BP 132/70 | HR 75 | Temp 98.0°F | Wt 180.0 lb

## 2015-06-05 DIAGNOSIS — H8141 Vertigo of central origin, right ear: Secondary | ICD-10-CM | POA: Diagnosis not present

## 2015-06-05 DIAGNOSIS — IMO0001 Reserved for inherently not codable concepts without codable children: Secondary | ICD-10-CM | POA: Insufficient documentation

## 2015-06-05 DIAGNOSIS — D3610 Benign neoplasm of peripheral nerves and autonomic nervous system, unspecified: Secondary | ICD-10-CM | POA: Insufficient documentation

## 2015-06-05 MED ORDER — PREDNISONE 20 MG PO TABS: 40.0000 mg | ORAL_TABLET | Freq: Every day | ORAL | Status: DC

## 2015-06-05 NOTE — Progress Notes (Signed)
Subjective:    Patient ID: Jennifer Schmitt, female    DOB: October 25, 1935, 80 y.o.   MRN: [id number]  HPI Here for follow up of ER visit Here with husband Reviewed ER record Deaf in right ear now--- associated with the vertigo Better but still nausea--like when working on computer Still not stable on her feet Meclizine does help the nausea Not comfortable enough to drive yet  No aphasia or dysphagia No facial droop No focal weakness No syncope No chest pain or SOB  Current Outpatient Prescriptions on File Prior to Visit  Medication Sig Dispense Refill  . acetaminophen (TYLENOL) 500 MG tablet Take 1,000 mg by mouth every 4 (four) hours as needed for mild pain.     Marland Kitchen ALPRAZolam (XANAX) 0.25 MG tablet Take 1 tablet (0.25 mg total) by mouth daily as needed for anxiety or sleep. 30 tablet 0  . ARTIFICIAL TEAR OP Apply 1 drop to eye 2 (two) times daily as needed (for dry eyes).     . cetirizine (ZYRTEC) 10 MG tablet Take 10 mg by mouth daily.    . Cholecalciferol (VITAMIN D) 2000 UNITS CAPS Take 1 capsule by mouth daily.    . clobetasol cream (TEMOVATE) 3.49 % Apply 1 application topically 2 (two) times daily as needed (for rash).     . cyanocobalamin (,VITAMIN B-12,) 1000 MCG/ML injection Inject 1,000 mcg into the muscle See admin instructions. Pt gets an injection every 34 days.    Marland Kitchen desonide (DESOWEN) 0.05 % lotion Apply 1 application topically 2 (two) times daily as needed (for rash).     . fluticasone (FLONASE) 50 MCG/ACT nasal spray Place 1 spray into both nostrils daily.    . folic acid (FOLVITE) 1 MG tablet Take 1 mg by mouth daily.    Marland Kitchen ketotifen (ZADITOR) 0.025 % ophthalmic solution Place 1 drop into both eyes daily as needed (for allergies).     . meclizine (ANTIVERT) 25 MG tablet Take 1 tablet (25 mg total) by mouth 3 (three) times daily as needed for dizziness or nausea. 30 tablet 1  . metoprolol succinate (TOPROL-XL) 25 MG 24 hr tablet Take 1 tablet (25 mg total) by mouth  daily. 90 tablet 3  . nystatin cream (MYCOSTATIN) Apply 1 application topically 2 (two) times daily as needed (for rash).    . Probiotic CAPS Take 1 capsule by mouth daily.    . rivaroxaban (XARELTO) 20 MG TABS tablet Take 1 tablet (20 mg total) by mouth daily with supper. 90 tablet 3  . rosuvastatin (CRESTOR) 5 MG tablet Take 1 tablet (5 mg total) by mouth daily. 90 tablet 3  . valsartan-hydrochlorothiazide (DIOVAN-HCT) 80-12.5 MG per tablet Take 1 tablet by mouth daily. (Patient taking differently: Take 0.5 tablets by mouth daily. ) 90 tablet 3   No current facility-administered medications on file prior to visit.    Allergies  Allergen Reactions  . Actonel [Risedronate Sodium] Nausea And Vomiting  . Boniva [Ibandronic Acid] Nausea And Vomiting  . Fosamax [Alendronate Sodium] Nausea And Vomiting  . Lipitor [Atorvastatin] Other (See Comments)    Reaction:  Muscle aches   . Miacalcin [Calcitonin ([place])] Other (See Comments)    Reaction:  Sneezing   . Talwin [Pentazocine] Other (See Comments)    Reaction:  Hallucinations   . Latex Rash  . Niaspan [Niacin Er] Swelling and Rash    Past Medical History  Diagnosis Date  . Hypertension   . Hyperlipidemia   . GERD (gastroesophageal reflux  disease)   . Allergic rhinitis due to pollen   . Atrial fibrillation   . PMR (polymyalgia rheumatica)   . Osteoarthritis, multiple sites   . Urge incontinence   . Adenomatous colon polyp   . Rheumatic fever   . Degenerative disc disease     cervical and lumbar  . Arrhythmia     A-Fib    Past Surgical History  Procedure Laterality Date  . Tonsillectomy and adenoidectomy    . Meningitis  1963  . Abdominal hysterectomy    . Breast biopsy Left 2010    negative  . Ankle fracture surgery Left 1996  . Synovectomy Right 1973    elbow  . Spine surgery    . Dexa  11/09/06    normal    Family History  Problem Relation Age of Onset  . Schizophrenia Mother     paranoid  . Heart disease  Mother     from psych meds  . Arthritis Mother   . Rheum arthritis Mother   . Cancer Father     prostate  . Arthritis Father   . Diabetes Father     History   Social History  . Marital Status: Married    Spouse Name: N/A  . Number of Children: 1  . Years of Education: N/A   Occupational History  . 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 Smoking status: Never Smoker   . Smokeless tobacco: Never Used  . Alcohol Use: Yes  . Drug Use: No  . Sexual Activity: Not on file   Other Topics Concern  . Not on file   Social History Narrative   Retired 2007    1 son in [place] area   Spends part time in [place]      Has living will   Husband then son is health care POA   Not sure about DNR---will keep open resuscitation for now    No tube feedings if cognitively unaware                  Review of Systems Got shot of pain in left arm and hand when IV put in arm Some trouble using left hand--like to comb hair    Objective:   Physical Exam  Constitutional: She is oriented to person, place, and time. She appears well-developed and well-nourished. No distress.  HENT:  TMs are fine  Eyes: Conjunctivae and EOM are normal. Pupils are equal, round, and reactive to light.  No nystagmus  Neck:  Mild decreased ROM of neck  Musculoskeletal: She exhibits no edema.  Neurological: She is alert and oriented to person, place, and time. She has normal strength. She displays no tremor. No cranial nerve deficit. She exhibits normal muscle tone. She displays a negative Romberg sign. Coordination and gait normal.  Slow careful walk but no ataxia  Psychiatric: She has a normal mood and affect. Her behavior is normal.          Assessment & Plan:

## 2015-06-05 NOTE — Assessment & Plan Note (Signed)
No symptoms or signs of brain stem stroke Sudden hearing loss along with vertigo Better now with only mild nausea--meclizine is helping some Will start empiric prednisone--- immediate ENT eval Not sure if she should have MRI also--will leave to ENT Records from ER reviewed--labs all okay

## 2015-06-05 NOTE — Patient Instructions (Signed)
Start the prednisone immediately. I will let the ENT decide about whether to continue this.

## 2015-06-20 ENCOUNTER — Ambulatory Visit (INDEPENDENT_AMBULATORY_CARE_PROVIDER_SITE_OTHER): Payer: Medicare PPO

## 2015-06-20 DIAGNOSIS — E538 Deficiency of other specified B group vitamins: Secondary | ICD-10-CM

## 2015-06-20 MED ORDER — CYANOCOBALAMIN 1000 MCG/ML IJ SOLN
1000.0000 ug | Freq: Once | INTRAMUSCULAR | Status: AC
  Administered 2015-06-20: 1000 ug via INTRAMUSCULAR

## 2015-06-27 ENCOUNTER — Other Ambulatory Visit: Payer: Self-pay | Admitting: Unknown Physician Specialty

## 2015-06-27 DIAGNOSIS — R42 Dizziness and giddiness: Secondary | ICD-10-CM

## 2015-07-02 ENCOUNTER — Ambulatory Visit
Admission: RE | Admit: 2015-07-02 | Discharge: 2015-07-02 | Disposition: A | Discharge status: Home / Self Care | Payer: Medicare PPO | Source: Ambulatory Visit | Attending: Unknown Physician Specialty | Admitting: Unknown Physician Specialty

## 2015-07-02 DIAGNOSIS — H61891 Other specified disorders of right external ear: Secondary | ICD-10-CM | POA: Insufficient documentation

## 2015-07-02 DIAGNOSIS — R42 Dizziness and giddiness: Secondary | ICD-10-CM | POA: Diagnosis present

## 2015-07-02 DIAGNOSIS — I739 Peripheral vascular disease, unspecified: Secondary | ICD-10-CM | POA: Diagnosis not present

## 2015-07-02 DIAGNOSIS — H9191 Unspecified hearing loss, right ear: Secondary | ICD-10-CM | POA: Diagnosis present

## 2015-07-02 MED ORDER — GADOBENATE DIMEGLUMINE 529 MG/ML IV SOLN
20.0000 mL | Freq: Once | INTRAVENOUS | Status: AC | PRN
  Administered 2015-07-02: 16 mL via INTRAVENOUS

## 2015-07-25 ENCOUNTER — Ambulatory Visit (INDEPENDENT_AMBULATORY_CARE_PROVIDER_SITE_OTHER): Payer: Medicare PPO

## 2015-07-25 DIAGNOSIS — E538 Deficiency of other specified B group vitamins: Secondary | ICD-10-CM

## 2015-07-25 MED ORDER — CYANOCOBALAMIN 1000 MCG/ML IJ SOLN
1000.0000 ug | Freq: Once | INTRAMUSCULAR | Status: AC
  Administered 2015-07-25: 1000 ug via INTRAMUSCULAR

## 2015-08-06 DIAGNOSIS — M62830 Muscle spasm of back: Secondary | ICD-10-CM | POA: Diagnosis not present

## 2015-08-06 DIAGNOSIS — M5412 Radiculopathy, cervical region: Secondary | ICD-10-CM | POA: Diagnosis not present

## 2015-08-06 DIAGNOSIS — M9901 Segmental and somatic dysfunction of cervical region: Secondary | ICD-10-CM | POA: Diagnosis not present

## 2015-08-06 DIAGNOSIS — M9902 Segmental and somatic dysfunction of thoracic region: Secondary | ICD-10-CM | POA: Diagnosis not present

## 2015-08-07 DIAGNOSIS — M62830 Muscle spasm of back: Secondary | ICD-10-CM | POA: Diagnosis not present

## 2015-08-07 DIAGNOSIS — M9902 Segmental and somatic dysfunction of thoracic region: Secondary | ICD-10-CM | POA: Diagnosis not present

## 2015-08-07 DIAGNOSIS — M9901 Segmental and somatic dysfunction of cervical region: Secondary | ICD-10-CM | POA: Diagnosis not present

## 2015-08-07 DIAGNOSIS — M5412 Radiculopathy, cervical region: Secondary | ICD-10-CM | POA: Diagnosis not present

## 2015-08-09 DIAGNOSIS — M9901 Segmental and somatic dysfunction of cervical region: Secondary | ICD-10-CM | POA: Diagnosis not present

## 2015-08-09 DIAGNOSIS — M5412 Radiculopathy, cervical region: Secondary | ICD-10-CM | POA: Diagnosis not present

## 2015-08-09 DIAGNOSIS — M62830 Muscle spasm of back: Secondary | ICD-10-CM | POA: Diagnosis not present

## 2015-08-09 DIAGNOSIS — M9902 Segmental and somatic dysfunction of thoracic region: Secondary | ICD-10-CM | POA: Diagnosis not present

## 2015-08-13 DIAGNOSIS — M62830 Muscle spasm of back: Secondary | ICD-10-CM | POA: Diagnosis not present

## 2015-08-13 DIAGNOSIS — M9901 Segmental and somatic dysfunction of cervical region: Secondary | ICD-10-CM | POA: Diagnosis not present

## 2015-08-13 DIAGNOSIS — M9902 Segmental and somatic dysfunction of thoracic region: Secondary | ICD-10-CM | POA: Diagnosis not present

## 2015-08-13 DIAGNOSIS — M5412 Radiculopathy, cervical region: Secondary | ICD-10-CM | POA: Diagnosis not present

## 2015-08-15 DIAGNOSIS — M5412 Radiculopathy, cervical region: Secondary | ICD-10-CM | POA: Diagnosis not present

## 2015-08-15 DIAGNOSIS — M62830 Muscle spasm of back: Secondary | ICD-10-CM | POA: Diagnosis not present

## 2015-08-15 DIAGNOSIS — M9901 Segmental and somatic dysfunction of cervical region: Secondary | ICD-10-CM | POA: Diagnosis not present

## 2015-08-15 DIAGNOSIS — M9902 Segmental and somatic dysfunction of thoracic region: Secondary | ICD-10-CM | POA: Diagnosis not present

## 2015-08-17 DIAGNOSIS — M5412 Radiculopathy, cervical region: Secondary | ICD-10-CM | POA: Diagnosis not present

## 2015-08-17 DIAGNOSIS — M9901 Segmental and somatic dysfunction of cervical region: Secondary | ICD-10-CM | POA: Diagnosis not present

## 2015-08-17 DIAGNOSIS — M62830 Muscle spasm of back: Secondary | ICD-10-CM | POA: Diagnosis not present

## 2015-08-17 DIAGNOSIS — M9902 Segmental and somatic dysfunction of thoracic region: Secondary | ICD-10-CM | POA: Diagnosis not present

## 2015-08-20 DIAGNOSIS — M62830 Muscle spasm of back: Secondary | ICD-10-CM | POA: Diagnosis not present

## 2015-08-20 DIAGNOSIS — M9902 Segmental and somatic dysfunction of thoracic region: Secondary | ICD-10-CM | POA: Diagnosis not present

## 2015-08-20 DIAGNOSIS — M9901 Segmental and somatic dysfunction of cervical region: Secondary | ICD-10-CM | POA: Diagnosis not present

## 2015-08-20 DIAGNOSIS — M5412 Radiculopathy, cervical region: Secondary | ICD-10-CM | POA: Diagnosis not present

## 2015-08-21 DIAGNOSIS — M9902 Segmental and somatic dysfunction of thoracic region: Secondary | ICD-10-CM | POA: Diagnosis not present

## 2015-08-21 DIAGNOSIS — M9901 Segmental and somatic dysfunction of cervical region: Secondary | ICD-10-CM | POA: Diagnosis not present

## 2015-08-21 DIAGNOSIS — M5412 Radiculopathy, cervical region: Secondary | ICD-10-CM | POA: Diagnosis not present

## 2015-08-21 DIAGNOSIS — M62830 Muscle spasm of back: Secondary | ICD-10-CM | POA: Diagnosis not present

## 2015-08-22 DIAGNOSIS — M9902 Segmental and somatic dysfunction of thoracic region: Secondary | ICD-10-CM | POA: Diagnosis not present

## 2015-08-22 DIAGNOSIS — M9901 Segmental and somatic dysfunction of cervical region: Secondary | ICD-10-CM | POA: Diagnosis not present

## 2015-08-22 DIAGNOSIS — M5412 Radiculopathy, cervical region: Secondary | ICD-10-CM | POA: Diagnosis not present

## 2015-08-22 DIAGNOSIS — M62830 Muscle spasm of back: Secondary | ICD-10-CM | POA: Diagnosis not present

## 2015-08-30 ENCOUNTER — Ambulatory Visit (INDEPENDENT_AMBULATORY_CARE_PROVIDER_SITE_OTHER): Payer: Medicare PPO

## 2015-08-30 DIAGNOSIS — M62830 Muscle spasm of back: Secondary | ICD-10-CM | POA: Diagnosis not present

## 2015-08-30 DIAGNOSIS — M9902 Segmental and somatic dysfunction of thoracic region: Secondary | ICD-10-CM | POA: Diagnosis not present

## 2015-08-30 DIAGNOSIS — E538 Deficiency of other specified B group vitamins: Secondary | ICD-10-CM

## 2015-08-30 DIAGNOSIS — Z23 Encounter for immunization: Secondary | ICD-10-CM | POA: Diagnosis not present

## 2015-08-30 DIAGNOSIS — M9901 Segmental and somatic dysfunction of cervical region: Secondary | ICD-10-CM | POA: Diagnosis not present

## 2015-08-30 DIAGNOSIS — M5412 Radiculopathy, cervical region: Secondary | ICD-10-CM | POA: Diagnosis not present

## 2015-08-30 MED ORDER — CYANOCOBALAMIN 1000 MCG/ML IJ SOLN
1000.0000 ug | Freq: Once | INTRAMUSCULAR | Status: AC
  Administered 2015-08-30: 1000 ug via INTRAMUSCULAR

## 2015-09-01 DIAGNOSIS — M5412 Radiculopathy, cervical region: Secondary | ICD-10-CM | POA: Diagnosis not present

## 2015-09-01 DIAGNOSIS — M9901 Segmental and somatic dysfunction of cervical region: Secondary | ICD-10-CM | POA: Diagnosis not present

## 2015-09-01 DIAGNOSIS — M62830 Muscle spasm of back: Secondary | ICD-10-CM | POA: Diagnosis not present

## 2015-09-01 DIAGNOSIS — M9902 Segmental and somatic dysfunction of thoracic region: Secondary | ICD-10-CM | POA: Diagnosis not present

## 2015-09-03 DIAGNOSIS — M5412 Radiculopathy, cervical region: Secondary | ICD-10-CM | POA: Diagnosis not present

## 2015-09-03 DIAGNOSIS — M9901 Segmental and somatic dysfunction of cervical region: Secondary | ICD-10-CM | POA: Diagnosis not present

## 2015-09-03 DIAGNOSIS — M9902 Segmental and somatic dysfunction of thoracic region: Secondary | ICD-10-CM | POA: Diagnosis not present

## 2015-09-03 DIAGNOSIS — M62830 Muscle spasm of back: Secondary | ICD-10-CM | POA: Diagnosis not present

## 2015-09-05 DIAGNOSIS — M9902 Segmental and somatic dysfunction of thoracic region: Secondary | ICD-10-CM | POA: Diagnosis not present

## 2015-09-05 DIAGNOSIS — M62830 Muscle spasm of back: Secondary | ICD-10-CM | POA: Diagnosis not present

## 2015-09-05 DIAGNOSIS — M9901 Segmental and somatic dysfunction of cervical region: Secondary | ICD-10-CM | POA: Diagnosis not present

## 2015-09-05 DIAGNOSIS — M5412 Radiculopathy, cervical region: Secondary | ICD-10-CM | POA: Diagnosis not present

## 2015-09-07 DIAGNOSIS — M5412 Radiculopathy, cervical region: Secondary | ICD-10-CM | POA: Diagnosis not present

## 2015-09-07 DIAGNOSIS — M9902 Segmental and somatic dysfunction of thoracic region: Secondary | ICD-10-CM | POA: Diagnosis not present

## 2015-09-07 DIAGNOSIS — M62830 Muscle spasm of back: Secondary | ICD-10-CM | POA: Diagnosis not present

## 2015-09-07 DIAGNOSIS — M9901 Segmental and somatic dysfunction of cervical region: Secondary | ICD-10-CM | POA: Diagnosis not present

## 2015-09-18 DIAGNOSIS — M5412 Radiculopathy, cervical region: Secondary | ICD-10-CM | POA: Diagnosis not present

## 2015-09-18 DIAGNOSIS — M9901 Segmental and somatic dysfunction of cervical region: Secondary | ICD-10-CM | POA: Diagnosis not present

## 2015-09-18 DIAGNOSIS — M62830 Muscle spasm of back: Secondary | ICD-10-CM | POA: Diagnosis not present

## 2015-09-18 DIAGNOSIS — M9902 Segmental and somatic dysfunction of thoracic region: Secondary | ICD-10-CM | POA: Diagnosis not present

## 2015-09-20 DIAGNOSIS — M5412 Radiculopathy, cervical region: Secondary | ICD-10-CM | POA: Diagnosis not present

## 2015-09-20 DIAGNOSIS — M9902 Segmental and somatic dysfunction of thoracic region: Secondary | ICD-10-CM | POA: Diagnosis not present

## 2015-09-20 DIAGNOSIS — M62830 Muscle spasm of back: Secondary | ICD-10-CM | POA: Diagnosis not present

## 2015-09-20 DIAGNOSIS — M9901 Segmental and somatic dysfunction of cervical region: Secondary | ICD-10-CM | POA: Diagnosis not present

## 2015-09-27 DIAGNOSIS — M9902 Segmental and somatic dysfunction of thoracic region: Secondary | ICD-10-CM | POA: Diagnosis not present

## 2015-09-27 DIAGNOSIS — M9901 Segmental and somatic dysfunction of cervical region: Secondary | ICD-10-CM | POA: Diagnosis not present

## 2015-09-27 DIAGNOSIS — M5412 Radiculopathy, cervical region: Secondary | ICD-10-CM | POA: Diagnosis not present

## 2015-09-27 DIAGNOSIS — M62830 Muscle spasm of back: Secondary | ICD-10-CM | POA: Diagnosis not present

## 2015-09-29 DIAGNOSIS — M9901 Segmental and somatic dysfunction of cervical region: Secondary | ICD-10-CM | POA: Diagnosis not present

## 2015-09-29 DIAGNOSIS — M5412 Radiculopathy, cervical region: Secondary | ICD-10-CM | POA: Diagnosis not present

## 2015-09-29 DIAGNOSIS — M62830 Muscle spasm of back: Secondary | ICD-10-CM | POA: Diagnosis not present

## 2015-09-29 DIAGNOSIS — M9902 Segmental and somatic dysfunction of thoracic region: Secondary | ICD-10-CM | POA: Diagnosis not present

## 2015-10-03 DIAGNOSIS — M5412 Radiculopathy, cervical region: Secondary | ICD-10-CM | POA: Diagnosis not present

## 2015-10-03 DIAGNOSIS — M62830 Muscle spasm of back: Secondary | ICD-10-CM | POA: Diagnosis not present

## 2015-10-03 DIAGNOSIS — M9902 Segmental and somatic dysfunction of thoracic region: Secondary | ICD-10-CM | POA: Diagnosis not present

## 2015-10-03 DIAGNOSIS — M9901 Segmental and somatic dysfunction of cervical region: Secondary | ICD-10-CM | POA: Diagnosis not present

## 2015-10-04 ENCOUNTER — Ambulatory Visit (INDEPENDENT_AMBULATORY_CARE_PROVIDER_SITE_OTHER): Payer: Medicare PPO | Admitting: *Deleted

## 2015-10-04 DIAGNOSIS — E538 Deficiency of other specified B group vitamins: Secondary | ICD-10-CM

## 2015-10-04 MED ORDER — CYANOCOBALAMIN 1000 MCG/ML IJ SOLN
1000.0000 ug | Freq: Once | INTRAMUSCULAR | Status: AC
  Administered 2015-10-04: 1000 ug via INTRAMUSCULAR

## 2015-10-05 DIAGNOSIS — M62830 Muscle spasm of back: Secondary | ICD-10-CM | POA: Diagnosis not present

## 2015-10-05 DIAGNOSIS — M9902 Segmental and somatic dysfunction of thoracic region: Secondary | ICD-10-CM | POA: Diagnosis not present

## 2015-10-05 DIAGNOSIS — M5412 Radiculopathy, cervical region: Secondary | ICD-10-CM | POA: Diagnosis not present

## 2015-10-05 DIAGNOSIS — M9901 Segmental and somatic dysfunction of cervical region: Secondary | ICD-10-CM | POA: Diagnosis not present

## 2015-10-09 DIAGNOSIS — M62830 Muscle spasm of back: Secondary | ICD-10-CM | POA: Diagnosis not present

## 2015-10-09 DIAGNOSIS — M5412 Radiculopathy, cervical region: Secondary | ICD-10-CM | POA: Diagnosis not present

## 2015-10-09 DIAGNOSIS — M9901 Segmental and somatic dysfunction of cervical region: Secondary | ICD-10-CM | POA: Diagnosis not present

## 2015-10-09 DIAGNOSIS — M9902 Segmental and somatic dysfunction of thoracic region: Secondary | ICD-10-CM | POA: Diagnosis not present

## 2015-10-11 DIAGNOSIS — M5412 Radiculopathy, cervical region: Secondary | ICD-10-CM | POA: Diagnosis not present

## 2015-10-11 DIAGNOSIS — M9901 Segmental and somatic dysfunction of cervical region: Secondary | ICD-10-CM | POA: Diagnosis not present

## 2015-10-11 DIAGNOSIS — M9902 Segmental and somatic dysfunction of thoracic region: Secondary | ICD-10-CM | POA: Diagnosis not present

## 2015-10-11 DIAGNOSIS — M62830 Muscle spasm of back: Secondary | ICD-10-CM | POA: Diagnosis not present

## 2015-10-12 ENCOUNTER — Encounter: Payer: Self-pay | Admitting: Internal Medicine

## 2015-10-12 ENCOUNTER — Ambulatory Visit (INDEPENDENT_AMBULATORY_CARE_PROVIDER_SITE_OTHER): Payer: Medicare PPO | Admitting: Internal Medicine

## 2015-10-12 VITALS — BP 138/70 | HR 67 | Temp 98.2°F | Ht 67.0 in | Wt 184.0 lb

## 2015-10-12 DIAGNOSIS — Z0000 Encounter for general adult medical examination without abnormal findings: Secondary | ICD-10-CM

## 2015-10-12 DIAGNOSIS — H8141 Vertigo of central origin, right ear: Secondary | ICD-10-CM | POA: Diagnosis not present

## 2015-10-12 DIAGNOSIS — I10 Essential (primary) hypertension: Secondary | ICD-10-CM

## 2015-10-12 DIAGNOSIS — D510 Vitamin B12 deficiency anemia due to intrinsic factor deficiency: Secondary | ICD-10-CM

## 2015-10-12 DIAGNOSIS — I480 Paroxysmal atrial fibrillation: Secondary | ICD-10-CM

## 2015-10-12 DIAGNOSIS — IMO0001 Reserved for inherently not codable concepts without codable children: Secondary | ICD-10-CM

## 2015-10-12 DIAGNOSIS — M353 Polymyalgia rheumatica: Secondary | ICD-10-CM | POA: Diagnosis not present

## 2015-10-12 DIAGNOSIS — F39 Unspecified mood [affective] disorder: Secondary | ICD-10-CM | POA: Diagnosis not present

## 2015-10-12 LAB — COMPREHENSIVE METABOLIC PANEL
ALK PHOS: 57 U/L (ref 39–117)
ALT: 14 U/L (ref 0–35)
AST: 18 U/L (ref 0–37)
Albumin: 4.1 g/dL (ref 3.5–5.2)
BUN: 21 mg/dL (ref 6–23)
CHLORIDE: 102 meq/L (ref 96–112)
CO2: 31 mEq/L (ref 19–32)
Calcium: 9.6 mg/dL (ref 8.4–10.5)
Creatinine, Ser: 0.83 mg/dL (ref 0.40–1.20)
GFR: 70.65 mL/min (ref >60.00)
Glucose, Bld: 99 mg/dL (ref 70–99)
Potassium: 5 mEq/L (ref 3.5–5.1)
SODIUM: 139 meq/L (ref 135–145)
TOTAL PROTEIN: 7.4 g/dL (ref 6.0–8.3)
Total Bilirubin: 0.4 mg/dL (ref 0.2–1.2)

## 2015-10-12 LAB — T4, FREE: FREE T4: 0.77 ng/dL (ref 0.60–1.60)

## 2015-10-12 LAB — CBC WITH DIFFERENTIAL/PLATELET
BASOS PCT: 0.1 % (ref 0.0–3.0)
Basophils Absolute: 0 10*3/uL (ref 0.0–0.1)
EOS ABS: 0.1 10*3/uL (ref 0.0–0.7)
Eosinophils Relative: 1.4 % (ref 0.0–5.0)
HEMATOCRIT: 37.5 % (ref 36.0–46.0)
Hemoglobin: 12.6 g/dL (ref 12.0–15.0)
LYMPHS ABS: 1.3 10*3/uL (ref 0.7–4.0)
Lymphocytes Relative: 26.9 % (ref 12.0–46.0)
MCHC: 33.5 g/dL (ref 30.0–36.0)
MCV: 96.3 fl (ref 78.0–100.0)
MONO ABS: 0.4 10*3/uL (ref 0.1–1.0)
Monocytes Relative: 9 % (ref 3.0–12.0)
NEUTROS ABS: 3.1 10*3/uL (ref 1.4–7.7)
NEUTROS PCT: 62.6 % (ref 43.0–77.0)
PLATELETS: 264 10*3/uL (ref 150.0–400.0)
RBC: 3.89 Mil/uL (ref 3.87–5.11)
RDW: 13.6 % (ref 11.5–15.5)
WBC: 4.9 10*3/uL (ref 4.0–10.5)

## 2015-10-12 LAB — LIPID PANEL
Cholesterol: 161 mg/dL (ref 0–200)
HDL: 65.2 mg/dL (ref >39.00)
LDL Cholesterol: 81 mg/dL (ref 0–99)
NONHDL: 95.78
Total CHOL/HDL Ratio: 2
Triglycerides: 72 mg/dL (ref 0.0–149.0)
VLDL: 14.4 mg/dL (ref 0.0–40.0)

## 2015-10-12 LAB — SEDIMENTATION RATE: SED RATE: 40 mm/h — AB (ref 0–22)

## 2015-10-12 LAB — VITAMIN B12: Vitamin B-12: 310 pg/mL (ref 211–911)

## 2015-10-12 NOTE — Assessment & Plan Note (Signed)
BP Readings from Last 3 Encounters:  10/12/15 138/70  06/05/15 132/70  05/31/15 153/58   Good control

## 2015-10-12 NOTE — Assessment & Plan Note (Signed)
No recent paroxysms On xarelto

## 2015-10-12 NOTE — Assessment & Plan Note (Signed)
I have personally reviewed the Medicare Annual Wellness questionnaire and have noted 1. The patient's medical and social history 2. Their use of alcohol, tobacco or illicit drugs 3. Their current medications and supplements 4. The patient's functional ability including ADL's, fall risks, home safety risks and hearing or visual             impairment. 5. Diet and physical activities 6. Evidence for depression or mood disorders  The patients weight, height, BMI and visual acuity have been recorded in the chart I have made referrals, counseling and provided education to the patient based review of the above and I have provided the pt with a written personalized care plan for preventive services.  I have provided you with a copy of your personalized plan for preventive services. Please take the time to review along with your updated medication list.  UTD on immunizations No cancer screening anymore Trying to increase her exercise again

## 2015-10-12 NOTE — Assessment & Plan Note (Signed)
Didn't respond to oral therapy Needs repeat level now

## 2015-10-12 NOTE — Assessment & Plan Note (Signed)
Due to see ENT again  Repeat MRI due also

## 2015-10-12 NOTE — Progress Notes (Signed)
Pre visit review using our clinic review tool, if applicable. No additional management support is needed unless otherwise documented below in the visit note. 

## 2015-10-12 NOTE — Assessment & Plan Note (Signed)
Mild anxiety in past Rare now

## 2015-10-12 NOTE — Progress Notes (Signed)
Subjective:    Patient ID: Jennifer Schmitt, female    DOB: 1935/02/16, 80 y.o.   MRN: [id number]  HPI Here for Medicare wellness and follow up of chronic medical conditions Reviewed form and advanced directives Reviewed other doctors Occasional wine spritzer only No tobacco Trying to exercise regularly No falls Independent with instrumental ADLs Vision is very good Ongoing hearing issues No significant memory issues  Still with problems from the vertigo Some nausea at times--no vomiting Unsteady at times--- has to be careful not to turn her head quickly Saw Dr Page Spiro still seeing audiologist. Right ear still no hearing--this is a problem Will need repeat MRI--possible Schwannoma  Some degree of fatigue still Wonders about anemia Continues on the B12 shots  No shoulder/hip myalgias of the PMR No Rx still No problems with the statin Mild joint issues---but not enough for meds (other than occasional tylenol)  No palpitations No chest pain Exercise tolerance is down with decreased activity--trying to get back to walking (treadmill) No syncope No edema  Mood has been good Irritable if she feels tired Only rarely uses the alprazolam No depression or anhedonia  Current Outpatient Prescriptions on File Prior to Visit  Medication Sig Dispense Refill  . acetaminophen (TYLENOL) 500 MG tablet Take 1,000 mg by mouth every 4 (four) hours as needed for mild pain.     Marland Kitchen ALPRAZolam (XANAX) 0.25 MG tablet Take 1 tablet (0.25 mg total) by mouth daily as needed for anxiety or sleep. 30 tablet 0  . ARTIFICIAL TEAR OP Apply 1 drop to eye 2 (two) times daily as needed (for dry eyes).     . cetirizine (ZYRTEC) 10 MG tablet Take 10 mg by mouth daily.    . Cholecalciferol (VITAMIN D) 2000 UNITS CAPS Take 1 capsule by mouth daily.    . clobetasol cream (TEMOVATE) 7.84 % Apply 1 application topically 2 (two) times daily as needed (for rash).     . cyanocobalamin (,VITAMIN B-12,) 1000  MCG/ML injection Inject 1,000 mcg into the muscle See admin instructions. Pt gets an injection every 34 days.    Marland Kitchen desonide (DESOWEN) 0.05 % lotion Apply 1 application topically 2 (two) times daily as needed (for rash).     Marland Kitchen diltiazem (CARDIZEM) 30 MG tablet Take 30 mg by mouth 3 (three) times daily.    . fluticasone (FLONASE) 50 MCG/ACT nasal spray Place 1 spray into both nostrils daily.    . folic acid (FOLVITE) 1 MG tablet Take 1 mg by mouth daily.    Marland Kitchen ketotifen (ZADITOR) 0.025 % ophthalmic solution Place 1 drop into both eyes daily as needed (for allergies).     . meclizine (ANTIVERT) 25 MG tablet Take 1 tablet (25 mg total) by mouth 3 (three) times daily as needed for dizziness or nausea. 30 tablet 1  . metoprolol succinate (TOPROL-XL) 25 MG 24 hr tablet Take 1 tablet (25 mg total) by mouth daily. 90 tablet 3  . nystatin cream (MYCOSTATIN) Apply 1 application topically 2 (two) times daily as needed (for rash).    . Probiotic CAPS Take 1 capsule by mouth daily.    . propranolol (INDERAL) 10 MG tablet Take 10 mg by mouth 3 (three) times daily.    . rivaroxaban (XARELTO) 20 MG TABS tablet Take 1 tablet (20 mg total) by mouth daily with supper. 90 tablet 3  . rosuvastatin (CRESTOR) 5 MG tablet Take 1 tablet (5 mg total) by mouth daily. 90 tablet 3  . valsartan-hydrochlorothiazide (DIOVAN-HCT)  80-12.5 MG per tablet Take 0.5 tablets by mouth daily.     No current facility-administered medications on file prior to visit.    Allergies  Allergen Reactions  . Actonel [Risedronate Sodium] Nausea And Vomiting  . Boniva [Ibandronic Acid] Nausea And Vomiting  . Fosamax [Alendronate Sodium] Nausea And Vomiting  . Lipitor [Atorvastatin] Other (See Comments)    Reaction:  Muscle aches   . Miacalcin [Calcitonin ([place])] Other (See Comments)    Reaction:  Sneezing   . Talwin [Pentazocine] Other (See Comments)    Reaction:  Hallucinations   . Latex Rash  . Niaspan [Niacin Er] Swelling and Rash     Past Medical History  Diagnosis Date  . Hypertension   . Hyperlipidemia   . GERD (gastroesophageal reflux disease)   . Allergic rhinitis due to pollen   . Atrial fibrillation ([place])   . PMR (polymyalgia rheumatica) ([facility])   . Osteoarthritis, multiple sites   . Urge incontinence   . Adenomatous colon polyp   . Rheumatic fever   . Degenerative disc disease     cervical and lumbar  . Arrhythmia     A-Fib    Past Surgical History  Procedure Laterality Date  . Tonsillectomy and adenoidectomy    . Meningitis  1963  . Abdominal hysterectomy    . Breast biopsy Left 2010    negative  . Ankle fracture surgery Left 1996  . Synovectomy Right 1973    elbow  . Spine surgery    . Dexa  11/09/06    normal    Family History  Problem Relation Age of Onset  . Schizophrenia Mother     paranoid  . Heart disease Mother     from psych meds  . Arthritis Mother   . Rheum arthritis Mother   . Cancer Father     prostate  . Arthritis Father   . Diabetes Father     Social History   Social History  . Marital Status: Married    Spouse Name: N/A  . Number of Children: 1  . Years of Education: N/A   Occupational History  . 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 Smoking status: Never Smoker   . Smokeless tobacco: Never Used  . Alcohol Use: Yes  . Drug Use: No  . Sexual Activity: Not on file   Other Topics Concern  . Not on file   Social History Narrative   Retired 2007    1 son in [place] area   Spends part time in [place]      Has living will   Husband then son is health care POA   Not sure about DNR---will keep open resuscitation for now    No tube feedings if cognitively unaware                  Review of Systems Appetite is fine Tries to eat healthy Weight up a few pounds Less exercise due to the vertigo Wears seat belt Some dental work--crown done. Keeps up with  dentist Intermittent rashes-- uses topical Rx. Due to go back to derm Bowels are fine Has a pelvic sling--- due to get recheck by gyn    Objective:   Physical Exam  Constitutional: She is oriented to person, place, and time. She appears well-developed and well-nourished. No distress.  HENT:  Mouth/Throat: Oropharynx is clear and moist. No oropharyngeal exudate.  Neck:  Normal range of motion. Neck supple. No thyromegaly present.  Cardiovascular: Normal rate, regular rhythm, normal heart sounds and intact distal pulses.  Exam reveals no gallop.   No murmur heard. Pulmonary/Chest: Effort normal and breath sounds normal. No respiratory distress. She has no wheezes. She has no rales.  Abdominal: Soft. There is no tenderness.  Musculoskeletal: She exhibits no edema or tenderness.  Lymphadenopathy:    She has no cervical adenopathy.  Neurological: She is alert and oriented to person, place, and time.  President-- "Ramon[employer], [place]" [phone] [id number] Recall 3/3  Skin: No rash noted. No erythema.  Psychiatric: She has a normal mood and affect. Her behavior is normal.          Assessment & Plan:

## 2015-10-12 NOTE — Assessment & Plan Note (Signed)
Seems to be in remission Will check ESR

## 2015-10-15 ENCOUNTER — Encounter: Payer: Self-pay | Admitting: Cardiovascular Disease

## 2015-10-15 ENCOUNTER — Other Ambulatory Visit: Payer: Self-pay | Admitting: Internal Medicine

## 2015-10-15 ENCOUNTER — Ambulatory Visit (INDEPENDENT_AMBULATORY_CARE_PROVIDER_SITE_OTHER): Payer: Medicare PPO | Admitting: Cardiovascular Disease

## 2015-10-15 VITALS — BP 128/72 | HR 59 | Ht 66.0 in | Wt 184.2 lb

## 2015-10-15 DIAGNOSIS — I10 Essential (primary) hypertension: Secondary | ICD-10-CM

## 2015-10-15 DIAGNOSIS — I4891 Unspecified atrial fibrillation: Secondary | ICD-10-CM

## 2015-10-15 DIAGNOSIS — E785 Hyperlipidemia, unspecified: Secondary | ICD-10-CM | POA: Diagnosis not present

## 2015-10-15 NOTE — Assessment & Plan Note (Signed)
Denies any significant arrhythmia. We'll continue current medications

## 2015-10-15 NOTE — Assessment & Plan Note (Signed)
Cholesterol is at goal on the current lipid regimen. No changes to the medications were made.  

## 2015-10-15 NOTE — Progress Notes (Signed)
Patient ID: Jennifer Schmitt, female    DOB: June 06, 1935, 80 y.o.   MRN: [id number]  HPI Comments: Jennifer Schmitt is a very pleasant 80 year old woman with long history of paroxysmal atrial fibrillation, obstructive sleep apnea who does not wear CPAP, hypertension, hyperlipidemia who presents for routine followup of her atrial fibrillation.   in follow-up today, she denies any significant episodes of atrial fibrillation She does have diltiazem and propranolol to take as needed  05/31/2015 had severe vomiting, went to the emergency room Electrolytes were normal, no atrial fibrillation Lab work reviewed with her today from recent lab draw, total cholesterol 160 B-12 improving, sedimentation rate 40  EKG on today's visit shows normal sinus rhythm with rate 59 bpm, no significant ST or T-wave changes  Other past medical history She reports having an episode of atrial fibrillation in August 2015 It lasted approximately 6 hours. She took diltiazem and propranolol.  she converted back to normal sinus rhythm Some restless nights, possibly from sleep apnea. History of headaches  Previously reported palpitations at night, very short-lived  In general she feels well, is active. She does not do regular exercise regimen. She spends much of her winter in [place], has a cardiologist there  Allergies  Allergen Reactions  . Actonel [Risedronate Sodium] Nausea And Vomiting  . Boniva [Ibandronic Acid] Nausea And Vomiting  . Fosamax [Alendronate Sodium] Nausea And Vomiting  . Lipitor [Atorvastatin] Other (See Comments)    Reaction:  Muscle aches   . Miacalcin [Calcitonin ([place])] Other (See Comments)    Reaction:  Sneezing   . Talwin [Pentazocine] Other (See Comments)    Reaction:  Hallucinations   . Latex Rash  . Niaspan [Niacin Er] Swelling and Rash    Outpatient Encounter Prescriptions as of 10/15/2015  Medication Sig  . acetaminophen (TYLENOL) 500 MG tablet Take 1,000 mg by mouth every 4 (four)  hours as needed for mild pain.   Marland Kitchen ALPRAZolam (XANAX) 0.25 MG tablet Take 1 tablet (0.25 mg total) by mouth daily as needed for anxiety or sleep.  Marland Kitchen ARTIFICIAL TEAR OP Apply 1 drop to eye 2 (two) times daily as needed (for dry eyes).   . cetirizine (ZYRTEC) 10 MG tablet Take 10 mg by mouth daily.  . Cholecalciferol (VITAMIN D) 2000 UNITS CAPS Take 1 capsule by mouth daily.  . clobetasol cream (TEMOVATE) 0.94 % Apply 1 application topically 2 (two) times daily as needed (for rash).   . cyanocobalamin (,VITAMIN B-12,) 1000 MCG/ML injection Inject 1,000 mcg into the muscle See admin instructions. Pt gets an injection every 34 days.  Marland Kitchen desonide (DESOWEN) 0.05 % lotion Apply 1 application topically 2 (two) times daily as needed (for rash).   Marland Kitchen diltiazem (CARDIZEM) 30 MG tablet Take 30 mg by mouth 3 (three) times daily.  . fluticasone (FLONASE) 50 MCG/ACT nasal spray Place 1 spray into both nostrils daily.  . folic acid (FOLVITE) 1 MG tablet Take 1 mg by mouth daily.  Marland Kitchen ketotifen (ZADITOR) 0.025 % ophthalmic solution Place 1 drop into both eyes daily as needed (for allergies).   . meclizine (ANTIVERT) 25 MG tablet Take 1 tablet (25 mg total) by mouth 3 (three) times daily as needed for dizziness or nausea.  . metoprolol succinate (TOPROL-XL) 25 MG 24 hr tablet Take 1 tablet (25 mg total) by mouth daily.  Marland Kitchen nystatin cream (MYCOSTATIN) Apply 1 application topically 2 (two) times daily as needed (for rash).  . Probiotic CAPS Take 1 capsule by mouth daily.  Marland Kitchen  propranolol (INDERAL) 10 MG tablet Take 10 mg by mouth 3 (three) times daily.  . rivaroxaban (XARELTO) 20 MG TABS tablet Take 1 tablet (20 mg total) by mouth daily with supper.  . rosuvastatin (CRESTOR) 5 MG tablet Take 1 tablet (5 mg total) by mouth daily.  . valsartan-hydrochlorothiazide (DIOVAN-HCT) 80-12.5 MG per tablet Take 0.5 tablets by mouth daily.   No facility-administered encounter medications on file as of 10/15/2015.    Past Medical  History  Diagnosis Date  . Hypertension   . Hyperlipidemia   . GERD (gastroesophageal reflux disease)   . Allergic rhinitis due to pollen   . Atrial fibrillation ([place])   . PMR (polymyalgia rheumatica) ([facility])   . Osteoarthritis, multiple sites   . Urge incontinence   . Adenomatous colon polyp   . Rheumatic fever   . Degenerative disc disease     cervical and lumbar  . Arrhythmia     A-Fib  . Vertigo   . Hearing loss in right ear     Past Surgical History  Procedure Laterality Date  . Tonsillectomy and adenoidectomy    . Meningitis  1963  . Abdominal hysterectomy    . Breast biopsy Left 2010    negative  . Ankle fracture surgery Left 1996  . Synovectomy Right 1973    elbow  . Spine surgery    . Dexa  11/09/06    normal    Social History  reports that she has never smoked. She has never used smokeless tobacco. She reports that she drinks alcohol. She reports that she does not use illicit drugs.  Family History family history includes Arthritis in her father and mother; Cancer in her father; Diabetes in her father; Heart disease in her mother; Rheum arthritis in her mother; Schizophrenia in her mother.        Review of Systems  Constitutional: Negative.   Respiratory: Negative.   Cardiovascular: Negative.   Gastrointestinal: Negative.   Musculoskeletal: Negative.   Neurological: Negative.   Hematological: Negative.   Psychiatric/Behavioral: Negative.   All other systems reviewed and are negative.   BP 128/72 mmHg  Pulse 59  Ht 5\' 6"  (1.676 m)  Wt 184 lb 4 oz (83.575 kg)  BMI 29.75 kg/m2  Physical Exam  Constitutional: She is oriented to person, place, and time. She appears well-developed and well-nourished.  HENT:  Head: Normocephalic.  Nose: Nose normal.  Mouth/Throat: Oropharynx is clear and moist.  Eyes: Conjunctivae are normal. Pupils are equal, round, and reactive to light.  Neck: Normal range of motion. Neck supple. No JVD present.   Cardiovascular: Normal rate, regular rhythm, S1 normal, S2 normal, normal heart sounds and intact distal pulses.  Exam reveals no gallop and no friction rub.   No murmur heard. Pulmonary/Chest: Effort normal and breath sounds normal. No respiratory distress. She has no wheezes. She has no rales. She exhibits no tenderness.  Abdominal: Soft. Bowel sounds are normal. She exhibits no distension. There is no tenderness.  Musculoskeletal: Normal range of motion. She exhibits no edema or tenderness.  Lymphadenopathy:    She has no cervical adenopathy.  Neurological: She is alert and oriented to person, place, and time. Coordination normal.  Skin: Skin is warm and dry. No rash noted. No erythema.  Psychiatric: She has a normal mood and affect. Her behavior is normal. Judgment and thought content normal.    Assessment and Plan  Nursing note and vitals reviewed.

## 2015-10-15 NOTE — Assessment & Plan Note (Signed)
Blood pressure is well controlled on today's visit. No changes made to the medications. 

## 2015-10-15 NOTE — Patient Instructions (Signed)
You are doing well. No medication changes were made.  Please call us if you have new issues that need to be addressed before your next appt.  Your physician wants you to follow-up in: 6 months.  You will receive a reminder letter in the mail two months in advance. If you don't receive a letter, please call our office to schedule the follow-up appointment.   

## 2015-10-17 DIAGNOSIS — M9902 Segmental and somatic dysfunction of thoracic region: Secondary | ICD-10-CM | POA: Diagnosis not present

## 2015-10-17 DIAGNOSIS — M9901 Segmental and somatic dysfunction of cervical region: Secondary | ICD-10-CM | POA: Diagnosis not present

## 2015-10-17 DIAGNOSIS — M62830 Muscle spasm of back: Secondary | ICD-10-CM | POA: Diagnosis not present

## 2015-10-17 DIAGNOSIS — M5412 Radiculopathy, cervical region: Secondary | ICD-10-CM | POA: Diagnosis not present

## 2015-10-24 DIAGNOSIS — M62830 Muscle spasm of back: Secondary | ICD-10-CM | POA: Diagnosis not present

## 2015-10-24 DIAGNOSIS — M9902 Segmental and somatic dysfunction of thoracic region: Secondary | ICD-10-CM | POA: Diagnosis not present

## 2015-10-24 DIAGNOSIS — M5412 Radiculopathy, cervical region: Secondary | ICD-10-CM | POA: Diagnosis not present

## 2015-10-24 DIAGNOSIS — M9901 Segmental and somatic dysfunction of cervical region: Secondary | ICD-10-CM | POA: Diagnosis not present

## 2015-10-31 DIAGNOSIS — M9901 Segmental and somatic dysfunction of cervical region: Secondary | ICD-10-CM | POA: Diagnosis not present

## 2015-10-31 DIAGNOSIS — M62830 Muscle spasm of back: Secondary | ICD-10-CM | POA: Diagnosis not present

## 2015-10-31 DIAGNOSIS — M9902 Segmental and somatic dysfunction of thoracic region: Secondary | ICD-10-CM | POA: Diagnosis not present

## 2015-10-31 DIAGNOSIS — M5412 Radiculopathy, cervical region: Secondary | ICD-10-CM | POA: Diagnosis not present

## 2015-11-07 DIAGNOSIS — M62830 Muscle spasm of back: Secondary | ICD-10-CM | POA: Diagnosis not present

## 2015-11-07 DIAGNOSIS — M5412 Radiculopathy, cervical region: Secondary | ICD-10-CM | POA: Diagnosis not present

## 2015-11-07 DIAGNOSIS — M9901 Segmental and somatic dysfunction of cervical region: Secondary | ICD-10-CM | POA: Diagnosis not present

## 2015-11-07 DIAGNOSIS — M9902 Segmental and somatic dysfunction of thoracic region: Secondary | ICD-10-CM | POA: Diagnosis not present

## 2015-11-13 ENCOUNTER — Ambulatory Visit: Payer: Medicare PPO

## 2015-11-14 DIAGNOSIS — M62830 Muscle spasm of back: Secondary | ICD-10-CM | POA: Diagnosis not present

## 2015-11-14 DIAGNOSIS — M9902 Segmental and somatic dysfunction of thoracic region: Secondary | ICD-10-CM | POA: Diagnosis not present

## 2015-11-14 DIAGNOSIS — M9901 Segmental and somatic dysfunction of cervical region: Secondary | ICD-10-CM | POA: Diagnosis not present

## 2015-11-14 DIAGNOSIS — M5412 Radiculopathy, cervical region: Secondary | ICD-10-CM | POA: Diagnosis not present

## 2015-11-20 DIAGNOSIS — H9121 Sudden idiopathic hearing loss, right ear: Secondary | ICD-10-CM | POA: Diagnosis not present

## 2015-11-21 ENCOUNTER — Other Ambulatory Visit: Payer: Self-pay | Admitting: Internal Medicine

## 2015-11-21 DIAGNOSIS — M62830 Muscle spasm of back: Secondary | ICD-10-CM | POA: Diagnosis not present

## 2015-11-21 DIAGNOSIS — M9902 Segmental and somatic dysfunction of thoracic region: Secondary | ICD-10-CM | POA: Diagnosis not present

## 2015-11-21 DIAGNOSIS — M5412 Radiculopathy, cervical region: Secondary | ICD-10-CM | POA: Diagnosis not present

## 2015-11-21 DIAGNOSIS — M9901 Segmental and somatic dysfunction of cervical region: Secondary | ICD-10-CM | POA: Diagnosis not present

## 2015-11-23 ENCOUNTER — Ambulatory Visit (INDEPENDENT_AMBULATORY_CARE_PROVIDER_SITE_OTHER): Payer: Medicare PPO

## 2015-11-23 DIAGNOSIS — E538 Deficiency of other specified B group vitamins: Secondary | ICD-10-CM | POA: Diagnosis not present

## 2015-11-23 MED ORDER — CYANOCOBALAMIN 1000 MCG/ML IJ SOLN
1000.0000 ug | Freq: Once | INTRAMUSCULAR | Status: AC
  Administered 2015-11-23: 1000 ug via INTRAMUSCULAR

## 2015-11-26 ENCOUNTER — Ambulatory Visit (INDEPENDENT_AMBULATORY_CARE_PROVIDER_SITE_OTHER)
Admission: RE | Admit: 2015-11-26 | Discharge: 2015-11-26 | Disposition: A | Discharge status: Home / Self Care | Payer: Medicare PPO | Source: Ambulatory Visit | Attending: Internal Medicine | Admitting: Internal Medicine

## 2015-11-26 ENCOUNTER — Encounter: Payer: Self-pay | Admitting: Internal Medicine

## 2015-11-26 ENCOUNTER — Ambulatory Visit (INDEPENDENT_AMBULATORY_CARE_PROVIDER_SITE_OTHER): Payer: Medicare PPO | Admitting: Internal Medicine

## 2015-11-26 VITALS — BP 120/70 | HR 66 | Temp 98.3°F | Wt 184.0 lb

## 2015-11-26 DIAGNOSIS — R0781 Pleurodynia: Secondary | ICD-10-CM

## 2015-11-26 DIAGNOSIS — R0789 Other chest pain: Secondary | ICD-10-CM | POA: Diagnosis not present

## 2015-11-26 NOTE — Assessment & Plan Note (Signed)
Seems to have had an acute injury but no better in 6 weeks Discussed acetaminophen Will check x-ray to see if fracture Told her it just will take time

## 2015-11-26 NOTE — Patient Instructions (Signed)
You can take acetaminophen [phone] mg three times daily.

## 2015-11-26 NOTE — Progress Notes (Signed)
Pre visit review using our clinic review tool, if applicable. No additional management support is needed unless otherwise documented below in the visit note. 

## 2015-11-26 NOTE — Progress Notes (Signed)
Subjective:    Patient ID: Jennifer Schmitt, female    DOB: 1935-01-18, 80 y.o.   MRN: [id number]  HPI Here due to left flank pain  Noticed pain about 6 weeks ago--under left breast Now sore around to flank Has been going to the gym---doesn't remember any injury Did notice pain at chiropractor when on her abdomen getting adjustment--that happened before the pain started  No fever No rash Tried biofreeze, ice and heat----no help Much worse with cough or sneeze Bra causes pain--can't wear now  Current Outpatient Prescriptions on File Prior to Visit  Medication Sig Dispense Refill  . acetaminophen (TYLENOL) 500 MG tablet Take 1,000 mg by mouth every 4 (four) hours as needed for mild pain.     Marland Kitchen ALPRAZolam (XANAX) 0.25 MG tablet Take 1 tablet (0.25 mg total) by mouth daily as needed for anxiety or sleep. 30 tablet 0  . ARTIFICIAL TEAR OP Apply 1 drop to eye 2 (two) times daily as needed (for dry eyes).     . cetirizine (ZYRTEC) 10 MG tablet Take 10 mg by mouth daily.    . Cholecalciferol (VITAMIN D) 2000 UNITS CAPS Take 1 capsule by mouth daily.    . clobetasol cream (TEMOVATE) AB-123456789 % Apply 1 application topically 2 (two) times daily as needed (for rash).     . cyanocobalamin (,VITAMIN B-12,) 1000 MCG/ML injection Inject 1,000 mcg into the muscle See admin instructions. Pt gets an injection every 34 days.    Marland Kitchen desonide (DESOWEN) 0.05 % lotion Apply 1 application topically 2 (two) times daily as needed (for rash).     . fluticasone (FLONASE) 50 MCG/ACT nasal spray Place 1 spray into both nostrils daily.    . folic acid (FOLVITE) 1 MG tablet TAKE 1 TABLET EVERY DAY 90 tablet 1  . ketotifen (ZADITOR) 0.025 % ophthalmic solution Place 1 drop into both eyes daily as needed (for allergies).     . meclizine (ANTIVERT) 25 MG tablet Take 1 tablet (25 mg total) by mouth 3 (three) times daily as needed for dizziness or nausea. 30 tablet 1  . metoprolol succinate (TOPROL-XL) 25 MG 24 hr tablet Take  1 tablet (25 mg total) by mouth daily. 90 tablet 3  . nystatin cream (MYCOSTATIN) Apply 1 application topically 2 (two) times daily as needed (for rash).    . Probiotic CAPS Take 1 capsule by mouth daily.    . rivaroxaban (XARELTO) 20 MG TABS tablet Take 1 tablet (20 mg total) by mouth daily with supper. 90 tablet 3  . rosuvastatin (CRESTOR) 5 MG tablet Take 1 tablet (5 mg total) by mouth daily. 90 tablet 3  . valsartan-hydrochlorothiazide (DIOVAN-HCT) 80-12.5 MG tablet TAKE 1 TABLET EVERY DAY 90 tablet 1   No current facility-administered medications on file prior to visit.    Allergies  Allergen Reactions  . Actonel [Risedronate Sodium] Nausea And Vomiting  . Boniva [Ibandronic Acid] Nausea And Vomiting  . Fosamax [Alendronate Sodium] Nausea And Vomiting  . Lipitor [Atorvastatin] Other (See Comments)    Reaction:  Muscle aches   . Miacalcin [Calcitonin ([place])] Other (See Comments)    Reaction:  Sneezing   . Talwin [Pentazocine] Other (See Comments)    Reaction:  Hallucinations   . Latex Rash  . Niaspan [Niacin Er] Swelling and Rash    Past Medical History  Diagnosis Date  . Hypertension   . Hyperlipidemia   . GERD (gastroesophageal reflux disease)   . Allergic rhinitis due to pollen   .  Atrial fibrillation ([place])   . PMR (polymyalgia rheumatica) (HCC)   . Osteoarthritis, multiple sites   . Urge incontinence   . Adenomatous colon polyp   . Rheumatic fever   . Degenerative disc disease     cervical and lumbar  . Arrhythmia     A-Fib  . Vertigo   . Hearing loss in right ear     Past Surgical History  Procedure Laterality Date  . Tonsillectomy and adenoidectomy    . Meningitis  1963  . Abdominal hysterectomy    . Breast biopsy Left 2010    negative  . Ankle fracture surgery Left 1996  . Synovectomy Right 1973    elbow  . Spine surgery    . Dexa  11/09/06    normal    Family History  Problem Relation Age of Onset  . Schizophrenia Mother     paranoid  .  Heart disease Mother     from psych meds  . Arthritis Mother   . Rheum arthritis Mother   . Cancer Father     prostate  . Arthritis Father   . Diabetes Father     Social History   Social History  . Marital Status: Married    Spouse Name: N/A  . Number of Children: 1  . Years of Education: N/A   Occupational History  . 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 Smoking status: Never Smoker   . Smokeless tobacco: Never Used  . Alcohol Use: Yes  . Drug Use: No  . Sexual Activity: Not on file   Other Topics Concern  . Not on file   Social History Narrative   Retired 2007    1 son in [place] area   Spends part time in [place]      Has living will   Husband then son is health care POA   Not sure about DNR---will keep open resuscitation for now    No tube feedings if cognitively unaware                  Review of Systems  No cough --but has allergy respiratory symptoms No SOB     Objective:   Physical Exam  Cardiovascular: Regular rhythm.  Exam reveals no gallop.   No murmur heard. Pulmonary/Chest: Effort normal and breath sounds normal. No respiratory distress. She has no wheezes. She has no rales.  No sternal tenderness Exquisite tenderness around left T10 or so No obvious step off  Skin: No rash noted.          Assessment & Plan:

## 2015-12-05 DIAGNOSIS — I4891 Unspecified atrial fibrillation: Secondary | ICD-10-CM | POA: Diagnosis not present

## 2015-12-05 DIAGNOSIS — E785 Hyperlipidemia, unspecified: Secondary | ICD-10-CM | POA: Diagnosis not present

## 2015-12-05 DIAGNOSIS — I10 Essential (primary) hypertension: Secondary | ICD-10-CM | POA: Diagnosis not present

## 2016-03-25 ENCOUNTER — Other Ambulatory Visit: Payer: Self-pay | Admitting: Unknown Physician Specialty

## 2016-03-25 DIAGNOSIS — H9121 Sudden idiopathic hearing loss, right ear: Secondary | ICD-10-CM

## 2016-04-01 ENCOUNTER — Ambulatory Visit (INDEPENDENT_AMBULATORY_CARE_PROVIDER_SITE_OTHER): Payer: Medicare Other | Admitting: Internal Medicine

## 2016-04-01 ENCOUNTER — Encounter: Payer: Self-pay | Admitting: Internal Medicine

## 2016-04-01 ENCOUNTER — Ambulatory Visit: Payer: Medicare PPO

## 2016-04-01 VITALS — BP 110/62 | HR 67 | Temp 97.8°F | Wt 184.0 lb

## 2016-04-01 DIAGNOSIS — I480 Paroxysmal atrial fibrillation: Secondary | ICD-10-CM | POA: Diagnosis not present

## 2016-04-01 DIAGNOSIS — E785 Hyperlipidemia, unspecified: Secondary | ICD-10-CM

## 2016-04-01 DIAGNOSIS — D510 Vitamin B12 deficiency anemia due to intrinsic factor deficiency: Secondary | ICD-10-CM

## 2016-04-01 DIAGNOSIS — M25561 Pain in right knee: Secondary | ICD-10-CM

## 2016-04-01 MED ORDER — CYANOCOBALAMIN 1000 MCG/ML IJ SOLN
1000.0000 ug | Freq: Once | INTRAMUSCULAR | Status: AC
  Administered 2016-04-01: 1000 ug via INTRAMUSCULAR

## 2016-04-01 NOTE — Assessment & Plan Note (Signed)
Regular Continues on xarelto

## 2016-04-01 NOTE — Assessment & Plan Note (Signed)
Clearly has some osteoarthritis but may have partial meniscus tear Not interested in surgery but needs more thorough evaluation and consider injections Will send to Dr Copland  Right foot probably mechanical--he can check this also

## 2016-04-01 NOTE — Assessment & Plan Note (Signed)
Due for her B12 today

## 2016-04-01 NOTE — Progress Notes (Signed)
Subjective:    Patient ID: Jennifer Schmitt, female    DOB: [birth year], 81 y.o.   MRN: [id number]  HPI Here due to leg pain Back in town from [place] Due for B12 shot  Pain in right knee and top of right foot Stiff in AM and at night Will note weakness if she walks on it Never had evaluation of knee Has to limit walking and exercise---all this winter in [place] (couldn't keep up with exercise class and can't do treadmill). Does do bicycle and some weights Has swollen at times Brief swelling and heat in foot---did improve with compression  Has tried ice for the knee---makes it feel better, but not really helping overall  Changed to generic of crestor--got the aching back Off the med now due to this Discussed primary prevention  No chest pain No palpitations  No SOB  Current Outpatient Prescriptions on File Prior to Visit  Medication Sig Dispense Refill  . acetaminophen (TYLENOL) 500 MG tablet Take 1,000 mg by mouth every 4 (four) hours as needed for mild pain.     Marland Kitchen ALPRAZolam (XANAX) 0.25 MG tablet Take 1 tablet (0.25 mg total) by mouth daily as needed for anxiety or sleep. 30 tablet 0  . ARTIFICIAL TEAR OP Apply 1 drop to eye 2 (two) times daily as needed (for dry eyes).     . clobetasol cream (TEMOVATE) AB-123456789 % Apply 1 application topically 2 (two) times daily as needed (for rash).     . cyanocobalamin (,VITAMIN B-12,) 1000 MCG/ML injection Inject 1,000 mcg into the muscle See admin instructions. Pt gets an injection every 34 days.    Marland Kitchen desonide (DESOWEN) 0.05 % lotion Apply 1 application topically 2 (two) times daily as needed (for rash).     Marland Kitchen diltiazem (CARDIZEM) 30 MG tablet Take 30 mg by mouth as needed.    . fluticasone (FLONASE) 50 MCG/ACT nasal spray Place 1 spray into both nostrils daily.    . folic acid (FOLVITE) 1 MG tablet TAKE 1 TABLET EVERY DAY 90 tablet 1  . ketotifen (ZADITOR) 0.025 % ophthalmic solution Place 1 drop into both eyes daily as needed (for  allergies).     . meclizine (ANTIVERT) 25 MG tablet Take 1 tablet (25 mg total) by mouth 3 (three) times daily as needed for dizziness or nausea. 30 tablet 1  . metoprolol succinate (TOPROL-XL) 25 MG 24 hr tablet Take 1 tablet (25 mg total) by mouth daily. 90 tablet 3  . nystatin cream (MYCOSTATIN) Apply 1 application topically 2 (two) times daily as needed (for rash).    . Probiotic CAPS Take 1 capsule by mouth daily.    . propranolol (INDERAL) 10 MG tablet Take 10 mg by mouth as needed.    . rivaroxaban (XARELTO) 20 MG TABS tablet Take 1 tablet (20 mg total) by mouth daily with supper. 90 tablet 3  . valsartan-hydrochlorothiazide (DIOVAN-HCT) 80-12.5 MG tablet TAKE 1 TABLET EVERY DAY (Patient taking differently: TAKE 1/2 TABLET EVERY DAY) 90 tablet 1   No current facility-administered medications on file prior to visit.    Allergies  Allergen Reactions  . Actonel [Risedronate Sodium] Nausea And Vomiting  . Boniva [Ibandronic Acid] Nausea And Vomiting  . Fosamax [Alendronate Sodium] Nausea And Vomiting  . Lipitor [Atorvastatin] Other (See Comments)    Reaction:  Muscle aches   . Miacalcin [Calcitonin ([place])] Other (See Comments)    Reaction:  Sneezing   . Talwin [Pentazocine] Other (See Comments)  Reaction:  Hallucinations   . Latex Rash  . Niaspan [Niacin Er] Swelling and Rash    Past Medical History  Diagnosis Date  . Hypertension   . Hyperlipidemia   . GERD (gastroesophageal reflux disease)   . Allergic rhinitis due to pollen   . Atrial fibrillation ([place])   . PMR (polymyalgia rheumatica) (HCC)   . Osteoarthritis, multiple sites   . Urge incontinence   . Adenomatous colon polyp   . Rheumatic fever   . Degenerative disc disease     cervical and lumbar  . Arrhythmia     A-Fib  . Vertigo   . Hearing loss in right ear     Past Surgical History  Procedure Laterality Date  . Tonsillectomy and adenoidectomy    . Meningitis  1963  . Abdominal hysterectomy    .  Breast biopsy Left 2010    negative  . Ankle fracture surgery Left 1996  . Synovectomy Right 1973    elbow  . Spine surgery    . Dexa  11/09/06    normal    Family History  Problem Relation Age of Onset  . Schizophrenia Mother     paranoid  . Heart disease Mother     from psych meds  . Arthritis Mother   . Rheum arthritis Mother   . Cancer Father     prostate  . Arthritis Father   . Diabetes Father     Social History   Social History  . Marital Status: Married    Spouse Name: N/A  . Number of Children: 1  . Years of Education: N/A   Occupational History  . 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 Smoking status: Never Smoker   . Smokeless tobacco: Never Used  . Alcohol Use: Yes  . Drug Use: No  . Sexual Activity: Not on file   Other Topics Concern  . Not on file   Social History Narrative   Retired 2007    1 son in [place] area   Spends part time in [place]      Has living will   Husband then son is health care POA   Not sure about DNR---will keep open resuscitation for now    No tube feedings if cognitively unaware                  Review of Systems Long standing neuropathy in right foot--toes. Not related to current knee/foot pain    Objective:   Physical Exam  Constitutional: She appears well-developed and well-nourished. No distress.  Neck: Normal range of motion. Neck supple. No thyromegaly present.  Cardiovascular: Normal rate, regular rhythm and normal heart sounds.  Exam reveals no gallop.   No murmur heard. Pulmonary/Chest: Effort normal and breath sounds normal. No respiratory distress. She has no wheezes. She has no rales.  Musculoskeletal: She exhibits no edema.  Right knee thickened without clear effusion No ligament instability Macmurrays shows mild pain medially Moderate crepitus  No findings in right foot  Lymphadenopathy:    She has no cervical  adenopathy.          Assessment & Plan:

## 2016-04-01 NOTE — Progress Notes (Signed)
Pre visit review using our clinic review tool, if applicable. No additional management support is needed unless otherwise documented below in the visit note. 

## 2016-04-01 NOTE — Assessment & Plan Note (Signed)
Discussed primary prevention--no known CAD Intolerant of many statins Would need brand crestor if anything Will hold off for now after discussion

## 2016-04-01 NOTE — Addendum Note (Signed)
Addended by: Pilar Grammes on: 04/01/2016 10:02 AM   Modules accepted: Orders

## 2016-04-07 ENCOUNTER — Encounter: Payer: Self-pay | Admitting: Family Medicine

## 2016-04-07 ENCOUNTER — Ambulatory Visit (INDEPENDENT_AMBULATORY_CARE_PROVIDER_SITE_OTHER)
Admission: RE | Admit: 2016-04-07 | Discharge: 2016-04-07 | Disposition: A | Discharge status: Home / Self Care | Payer: Medicare Other | Source: Ambulatory Visit | Attending: Family Medicine | Admitting: Family Medicine

## 2016-04-07 ENCOUNTER — Ambulatory Visit (INDEPENDENT_AMBULATORY_CARE_PROVIDER_SITE_OTHER): Payer: Medicare Other | Admitting: Family Medicine

## 2016-04-07 ENCOUNTER — Other Ambulatory Visit: Payer: Self-pay | Admitting: Internal Medicine

## 2016-04-07 VITALS — BP 122/60 | HR 66 | Temp 98.8°F | Ht 67.0 in | Wt 184.8 lb

## 2016-04-07 DIAGNOSIS — M25561 Pain in right knee: Secondary | ICD-10-CM | POA: Diagnosis not present

## 2016-04-07 DIAGNOSIS — M76821 Posterior tibial tendinitis, right leg: Secondary | ICD-10-CM | POA: Diagnosis not present

## 2016-04-07 DIAGNOSIS — M2391 Unspecified internal derangement of right knee: Secondary | ICD-10-CM | POA: Diagnosis not present

## 2016-04-07 DIAGNOSIS — M1711 Unilateral primary osteoarthritis, right knee: Secondary | ICD-10-CM

## 2016-04-07 MED ORDER — METHYLPREDNISOLONE ACETATE 40 MG/ML IJ SUSP
80.0000 mg | Freq: Once | INTRAMUSCULAR | Status: AC
  Administered 2016-04-07: 80 mg via INTRA_ARTICULAR

## 2016-04-07 NOTE — Patient Instructions (Signed)
Posterior Tib and arch rehab Begin with easy walking, heel, toe and backwards * Try to pick an easy location like a hallway or a room in your house and do one of these each time that you go through this area.  Towel "Scrunch Ups" Use a hand towel or a moderate size towel Foot flat down on the towel Use toes to "scrunch up the towel" straight up and down, and going to the right and left.  3 sets of 20 * Can be done watching TV, reading, or sitting and relaxing.   You can also pick up ping-pong balls or marbles with toes.

## 2016-04-07 NOTE — Progress Notes (Signed)
Pre visit review using our clinic review tool, if applicable. No additional management support is needed unless otherwise documented below in the visit note. 

## 2016-04-08 ENCOUNTER — Ambulatory Visit: Payer: Self-pay

## 2016-04-08 ENCOUNTER — Ambulatory Visit: Payer: Medicare Other

## 2016-04-08 NOTE — Progress Notes (Signed)
Dr. Frederico Hamman T. Margaretha Mahan, MD, [facility] [place], [address] Phone: [phone] Fax: [phone]  04/07/2016  Patient: Jennifer Schmitt, MRN: [id number], DOB: 07/30/1935, 81 y.o.  Primary Physician:  [name], MD   Chief Complaint  Patient presents with  . Knee Pain    right  . Foot Pain    right   Subjective:   Jennifer Schmitt is a 81 y.o. very pleasant female patient who presents with the following:  Patient presents to my her primary complaints, right knee and right foot pain.  Right knee: This is been an ongoing problem for about 6 months or more.  She has no known injury.  She has had some occasional effusion intermittently.  She does have some significant limitation in her normal functional activities.  She is regularly active and works out at [employer] in their fitness facilities.  She will work out with some weights along with the bicycle, and she also does a lot of classes.  She is been fairly limited with this in the course of the last number of months.  She is not having any locking up or functional giving way.  No prior history of surgery on this knee or fracture.  Right foot, she points to the medial foot at the navicular, just proximal to this which is worse with weightbearing and walking for distance.  Past Medical History, Surgical History, Social History, Family History, Problem List, Medications, and Allergies have been reviewed and updated if relevant.  Patient Active Problem List   Diagnosis Date Noted  . Right knee pain 04/01/2016  . Rib pain on left side 11/26/2015  . Vertigo of central origin of right ear 06/05/2015  . Pernicious anemia 04/11/2015  . Mild mood disorder ([place]) 04/11/2015  . Encounter for anticoagulation discussion and counseling 03/27/2015  . Visit for preventive health examination 10/10/2014  . Advanced directives, counseling/discussion 10/10/2014  . Allergic sinusitis  09/14/2014  . Obstructive sleep apnea 04/20/2014  . Hypertension   . Hyperlipidemia   . GERD (gastroesophageal reflux disease)   . Allergic rhinitis due to pollen   . Atrial fibrillation ([place])   . PMR (polymyalgia rheumatica) (HCC)   . Osteoarthritis, multiple sites   . Degenerative disc disease     Past Medical History  Diagnosis Date  . Hypertension   . Hyperlipidemia   . GERD (gastroesophageal reflux disease)   . Allergic rhinitis due to pollen   . Atrial fibrillation ([place])   . PMR (polymyalgia rheumatica) (HCC)   . Osteoarthritis, multiple sites   . Urge incontinence   . Adenomatous colon polyp   . Rheumatic fever   . Degenerative disc disease     cervical and lumbar  . Arrhythmia     A-Fib  . Vertigo   . Hearing loss in right ear     Past Surgical History  Procedure Laterality Date  . Tonsillectomy and adenoidectomy    . Meningitis  1963  . Abdominal hysterectomy    . Breast biopsy Left 2010    negative  . Ankle fracture surgery Left 1996  . Synovectomy Right 1973    elbow  . Spine surgery    . Dexa  11/09/06    normal    Social History   Social History  . Marital Status: Married    Spouse Name: N/A  . Number of Children: 1  . Years of Education: N/A   Occupational  History  . 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 Smoking status: Never Smoker   . Smokeless tobacco: Never Used  . Alcohol Use: Yes  . Drug Use: No  . Sexual Activity: Not on file   Other Topics Concern  . Not on file   Social History Narrative   Retired 2007    1 son in [place] area   Spends part time in [place]      Has living will   Husband then son is health care POA   Not sure about DNR---will keep open resuscitation for now    No tube feedings if cognitively unaware                   Family History  Problem Relation Age of Onset  . Schizophrenia Mother     paranoid  . Heart disease  Mother     from psych meds  . Arthritis Mother   . Rheum arthritis Mother   . Cancer Father     prostate  . Arthritis Father   . Diabetes Father     Allergies  Allergen Reactions  . Actonel [Risedronate Sodium] Nausea And Vomiting  . Boniva [Ibandronic Acid] Nausea And Vomiting  . Fosamax [Alendronate Sodium] Nausea And Vomiting  . Lipitor [Atorvastatin] Other (See Comments)    Reaction:  Muscle aches   . Miacalcin [Calcitonin ([place])] Other (See Comments)    Reaction:  Sneezing   . Talwin [Pentazocine] Other (See Comments)    Reaction:  Hallucinations   . Latex Rash  . Niaspan [Niacin Er] Swelling and Rash    Medication list reviewed and updated in full in [place].  GEN: No fevers, chills. Nontoxic. Primarily MSK c/o today. MSK: Detailed in the HPI GI: tolerating PO intake without difficulty Neuro: No numbness, parasthesias, or tingling associated. Otherwise the pertinent positives of the ROS are noted above.   Objective:   BP 122/60 mmHg  Pulse 66  Temp(Src) 98.8 F (37.1 C) (Oral)  Ht 5\' 7"  (1.702 m)  Wt 184 lb 12.8 oz (83.825 kg)  BMI 28.94 kg/m2  SpO2 96%   GEN: WDWN, NAD, Non-toxic, Alert & Oriented x 3 HEENT: Atraumatic, Normocephalic.  Ears and Nose: No external deformity. EXTR: No clubbing/cyanosis/edema NEURO: Normal gait.  PSYCH: Normally interactive. Conversant. Not depressed or anxious appearing.  Calm demeanor.   Knee:  r Gait: Normal heel toe pattern ROM: 0-115 Effusion: very mild Echymosis or edema: none Patellar tendon NT Painful PLICA: neg Patellar grind: negative Medial and lateral patellar facet loading: mild ttp medial and lateral joint lines: pain medially Mcmurray's POS for pain Flexion-pinch pos Varus and valgus stress: stable Lachman: neg Ant and Post drawer: neg Hip abduction, IR, ER: WNL Hip flexion str: 5/5 Hip abd: 5/5 Quad: 5/5 VMO atrophy: mild Hamstring concentric and eccentric: 5/5   Pain in the foot  is limited to the insertion of the posterior tibialis at the navicular.  Initially this is not hurting the patient, but after I stressed this and have her go up on her toes, and this hurts quite a bit more.  There is minimal pain in the proximal posterior tibialis tendon and musculature.  The rest of the bony anatomy in the foot and ankle appears to be normal on exam and nontender.  Radiology: Dg Knee Ap/lat W/sunrise Right  04/07/2016  CLINICAL DATA:  Right knee pain.  Probable  osteoarthritis EXAM: RIGHT KNEE 3 VIEWS COMPARISON:  None. FINDINGS: Degenerative changes with joint space narrowing and spurring, most pronounced in the patellofemoral compartment. No acute bony abnormality. Specifically, no fracture, subluxation, or dislocation. Soft tissues are intact. No joint effusion IMPRESSION: Degenerative changes, most pronounced in the patellofemoral compartment with moderate to severe changes. No acute bony abnormality. Electronically Signed   By: Rolm Baptise M.D.   On: 04/07/2016 11:23    Assessment and Plan:   Primary osteoarthritis of right knee  Right knee pain - Plan: DG Knee AP/LAT W/Sunrise Right, methylPREDNISolone acetate (DEPO-MEDROL) injection 80 mg  Internal derangement of right knee  Tibialis posterior tendinitis, right  aarthritis is mild with the exception of the patellofemoral joint.  I am more suspicious that the patient has a degenerative meniscal tear medially.  Reviewed anatomy, and discussed options.  For now we're going to continue with conservative care, and do a trial of a corticosteroid injection.  The patient is on Xarelto.  If she continues to have symptoms and his problem with this knee in 4-6 weeks, I would like her to follow-up and a master to do such.  Right posterior tib tendinopathy.  For this patient, think the best thing his inability to wear good shoes and I given her some very basic and gentle rehabilitation to do on a daily basis.  Knee Injection,  R Patient verbally consented to procedure. Risks (including potential rare risk of infection), benefits, and alternatives explained. Sterilely prepped with Chloraprep. Ethyl cholride used for anesthesia. 8 cc Lidocaine 1% mixed with 2 mL Depo-Medrol 40 mg injected using the anteromedial approach without difficulty. No complications with procedure and tolerated well. Patient had decreased pain post-injection.   Follow-up: No Follow-up on file.  New Prescriptions   No medications on file   Modified Medications   Modified Medication Previous Medication   FOLIC ACID (FOLVITE) 1 MG TABLET folic acid (FOLVITE) 1 MG tablet      TAKE 1 TABLET EVERY DAY    TAKE 1 TABLET EVERY DAY   Orders Placed This Encounter  Procedures  . DG Knee AP/LAT W/Sunrise Right   Patient Instructions  Posterior Tib and arch rehab Begin with easy walking, heel, toe and backwards * Try to pick an easy location like a hallway or a room in your house and do one of these each time that you go through this area.  Towel "Scrunch Ups" Use a hand towel or a moderate size towel Foot flat down on the towel Use toes to "scrunch up the towel" straight up and down, and going to the right and left.  3 sets of 20 * Can be done watching TV, reading, or sitting and relaxing.   You can also pick up ping-pong balls or marbles with toes.     Signed,  Maud Deed. Jolaine Fryberger, MD   Patient's Medications  New Prescriptions   No medications on file  Previous Medications   ACETAMINOPHEN (TYLENOL) 500 MG TABLET    Take 1,000 mg by mouth every 4 (four) hours as needed for mild pain.    ALPRAZOLAM (XANAX) 0.25 MG TABLET    Take 1 tablet (0.25 mg total) by mouth daily as needed for anxiety or sleep.   ARTIFICIAL TEAR OP    Apply 1 drop to eye 2 (two) times daily as needed (for dry eyes).    CLOBETASOL CREAM (TEMOVATE) 0.05 %    Apply 1 application topically 2 (two) times daily as needed (for rash).  CYANOCOBALAMIN (,VITAMIN B-12,)  1000 MCG/ML INJECTION    Inject 1,000 mcg into the muscle See admin instructions. Pt gets an injection every 34 days.   DESONIDE (DESOWEN) 0.05 % LOTION    Apply 1 application topically 2 (two) times daily as needed (for rash).    DILTIAZEM (CARDIZEM) 30 MG TABLET    Take 30 mg by mouth as needed.   FLUTICASONE (FLONASE) 50 MCG/ACT NASAL SPRAY    Place 1 spray into both nostrils daily.   KETOTIFEN (ZADITOR) 0.025 % OPHTHALMIC SOLUTION    Place 1 drop into both eyes daily as needed (for allergies).    MECLIZINE (ANTIVERT) 25 MG TABLET    Take 1 tablet (25 mg total) by mouth 3 (three) times daily as needed for dizziness or nausea.   METOPROLOL SUCCINATE (TOPROL-XL) 25 MG 24 HR TABLET    Take 1 tablet (25 mg total) by mouth daily.   NYSTATIN CREAM (MYCOSTATIN)    Apply 1 application topically 2 (two) times daily as needed (for rash).   PROBIOTIC CAPS    Take 1 capsule by mouth daily.   PROPRANOLOL (INDERAL) 10 MG TABLET    Take 10 mg by mouth as needed.   RIVAROXABAN (XARELTO) 20 MG TABS TABLET    Take 1 tablet (20 mg total) by mouth daily with supper.   VALSARTAN-HYDROCHLOROTHIAZIDE (DIOVAN-HCT) 80-12.5 MG TABLET    TAKE 1 TABLET EVERY DAY  Modified Medications   Modified Medication Previous Medication   FOLIC ACID (FOLVITE) 1 MG TABLET folic acid (FOLVITE) 1 MG tablet      TAKE 1 TABLET EVERY DAY    TAKE 1 TABLET EVERY DAY  Discontinued Medications   No medications on file

## 2016-04-12 ENCOUNTER — Encounter: Payer: Self-pay | Admitting: Internal Medicine

## 2016-04-14 NOTE — Telephone Encounter (Signed)
noted 

## 2016-04-16 ENCOUNTER — Ambulatory Visit: Payer: Medicare PPO | Admitting: Cardiovascular Disease

## 2016-04-21 ENCOUNTER — Encounter: Payer: Self-pay | Admitting: Cardiovascular Disease

## 2016-04-21 ENCOUNTER — Ambulatory Visit (INDEPENDENT_AMBULATORY_CARE_PROVIDER_SITE_OTHER): Payer: Medicare Other | Admitting: Cardiovascular Disease

## 2016-04-21 VITALS — BP 140/60 | HR 66 | Ht 66.0 in | Wt 183.0 lb

## 2016-04-21 DIAGNOSIS — E785 Hyperlipidemia, unspecified: Secondary | ICD-10-CM

## 2016-04-21 DIAGNOSIS — R0789 Other chest pain: Secondary | ICD-10-CM

## 2016-04-21 DIAGNOSIS — I10 Essential (primary) hypertension: Secondary | ICD-10-CM | POA: Diagnosis not present

## 2016-04-21 DIAGNOSIS — I480 Paroxysmal atrial fibrillation: Secondary | ICD-10-CM | POA: Diagnosis not present

## 2016-04-21 MED ORDER — DILTIAZEM HCL 30 MG PO TABS: 30.0000 mg | ORAL_TABLET | Freq: Three times a day (TID) | ORAL | Status: DC | PRN

## 2016-04-21 MED ORDER — PROPRANOLOL HCL 10 MG PO TABS: 10.0000 mg | ORAL_TABLET | Freq: Three times a day (TID) | ORAL | Status: DC | PRN

## 2016-04-21 NOTE — Assessment & Plan Note (Signed)
Rare episodes of atrial fibrillation, no changes to her medications We have refilled her propranolol, diltiazem

## 2016-04-21 NOTE — Patient Instructions (Signed)
You are doing well. No medication changes were made.  Please call us if you have new issues that need to be addressed before your next appt.  Your physician wants you to follow-up in: 6 months.  You will receive a reminder letter in the mail two months in advance. If you don't receive a letter, please call our office to schedule the follow-up appointment.   

## 2016-04-21 NOTE — Assessment & Plan Note (Signed)
Blood pressure is well controlled on today's visit. No changes made to the medications. 

## 2016-04-21 NOTE — Progress Notes (Signed)
Patient ID: Jennifer Schmitt, female    DOB: 1935/12/11, 81 y.o.   MRN: [id number]  HPI Comments: Jennifer Schmitt is a very pleasant 81 year old woman with long history of paroxysmal atrial fibrillation, obstructive sleep apnea who does not wear CPAP, hypertension, hyperlipidemia who presents for routine followup of her atrial fibrillation. She spends much of her winter in [place], has a cardiologist there   in follow-up today, she denies any significant episodes of atrial fibrillation She does have diltiazem and propranolol to take as needed She has been exercising almost daily, feels strong, no balance issues Reports having some left-sided chest discomfort, worse on palpation, feels that it could be musculoskeletal, possibly from her neck. Continues to have chronic dizziness, vertigo symptoms.  Changed from Crestor to generic rosuvastatin, developed leg cramping, had to stop the medication Had similar problems on Lipitor Prior total cholesterol 161, no repeat since she has come off the medication  EKG on today's visit shows normal sinus rhythm with rate 58 bpm, no significant ST or T-wave changes  Other past medical history reviewed 05/31/2015 had severe vomiting, went to the emergency room Electrolytes were normal, no atrial fibrillation Lab work reviewed with her today from recent lab draw, total cholesterol 160 B-12 improving, sedimentation rate 40  She reports having an episode of atrial fibrillation in August 2015 It lasted approximately 6 hours. She took diltiazem and propranolol.  she converted back to normal sinus rhythm Some restless nights, possibly from sleep apnea. History of headaches  Previously reported palpitations at night, very short-lived  Allergies  Allergen Reactions  . Actonel [Risedronate Sodium] Nausea And Vomiting  . Boniva [Ibandronic Acid] Nausea And Vomiting  . Fosamax [Alendronate Sodium] Nausea And Vomiting  . Lipitor [Atorvastatin] Other (See Comments)    Reaction:  Muscle aches   . Miacalcin [Calcitonin ([place])] Other (See Comments)    Reaction:  Sneezing   . Talwin [Pentazocine] Other (See Comments)    Reaction:  Hallucinations   . Latex Rash  . Niaspan [Niacin Er] Swelling and Rash    Outpatient Encounter Prescriptions as of 04/21/2016  Medication Sig  . acetaminophen (TYLENOL) 500 MG tablet Take 1,000 mg by mouth every 4 (four) hours as needed for mild pain.   Marland Kitchen ALPRAZolam (XANAX) 0.25 MG tablet Take 1 tablet (0.25 mg total) by mouth daily as needed for anxiety or sleep.  Marland Kitchen ARTIFICIAL TEAR OP Apply 1 drop to eye 2 (two) times daily as needed (for dry eyes).   . cholecalciferol (VITAMIN D) 1000 units tablet Take 1,000 Units by mouth daily.  . clobetasol cream (TEMOVATE) AB-123456789 % Apply 1 application topically 2 (two) times daily as needed (for rash).   . cyanocobalamin (,VITAMIN B-12,) 1000 MCG/ML injection Inject 1,000 mcg into the muscle See admin instructions. Pt gets an injection every 34 days.  Marland Kitchen desonide (DESOWEN) 0.05 % lotion Apply 1 application topically 2 (two) times daily as needed (for rash).   Marland Kitchen diltiazem (CARDIZEM) 30 MG tablet Take 1 tablet (30 mg total) by mouth 3 (three) times daily as needed.  . fluticasone (FLONASE) 50 MCG/ACT nasal spray Place 1 spray into both nostrils daily.  . folic acid (FOLVITE) 1 MG tablet TAKE 1 TABLET EVERY DAY  . ketotifen (ZADITOR) 0.025 % ophthalmic solution Place 1 drop into both eyes daily as needed (for allergies).   . meclizine (ANTIVERT) 25 MG tablet Take 1 tablet (25 mg total) by mouth 3 (three) times daily as needed for dizziness or nausea.  Marland Kitchen  metoprolol succinate (TOPROL-XL) 25 MG 24 hr tablet Take 1 tablet (25 mg total) by mouth daily.  Marland Kitchen nystatin cream (MYCOSTATIN) Apply 1 application topically 2 (two) times daily as needed (for rash).  . Probiotic CAPS Take 1 capsule by mouth daily.  . propranolol (INDERAL) 10 MG tablet Take 1 tablet (10 mg total) by mouth 3 (three) times daily as  needed.  . rivaroxaban (XARELTO) 20 MG TABS tablet Take 1 tablet (20 mg total) by mouth daily with supper.  . valsartan-hydrochlorothiazide (DIOVAN-HCT) 80-12.5 MG tablet TAKE 1 TABLET EVERY DAY (Patient taking differently: TAKE 1/2 TABLET EVERY DAY)  . [DISCONTINUED] diltiazem (CARDIZEM) 30 MG tablet Take 30 mg by mouth as needed.  . [DISCONTINUED] propranolol (INDERAL) 10 MG tablet Take 10 mg by mouth as needed.   No facility-administered encounter medications on file as of 04/21/2016.    Past Medical History  Diagnosis Date  . Hypertension   . Hyperlipidemia   . GERD (gastroesophageal reflux disease)   . Allergic rhinitis due to pollen   . Atrial fibrillation ([place])   . PMR (polymyalgia rheumatica) (HCC)   . Osteoarthritis, multiple sites   . Urge incontinence   . Adenomatous colon polyp   . Rheumatic fever   . Degenerative disc disease     cervical and lumbar  . Arrhythmia     A-Fib  . Vertigo   . Hearing loss in right ear     Past Surgical History  Procedure Laterality Date  . Tonsillectomy and adenoidectomy    . Meningitis  1963  . Abdominal hysterectomy    . Breast biopsy Left 2010    negative  . Ankle fracture surgery Left 1996  . Synovectomy Right 1973    elbow  . Spine surgery    . Dexa  11/09/06    normal    Social History  reports that she has never smoked. She has never used smokeless tobacco. She reports that she drinks alcohol. She reports that she does not use illicit drugs.  Family History family history includes Arthritis in her father and mother; Cancer in her father; Diabetes in her father; Heart disease in her mother; Rheum arthritis in her mother; Schizophrenia in her mother.        Review of Systems  Constitutional: Negative.   Respiratory: Negative.   Cardiovascular: Negative.   Gastrointestinal: Negative.   Musculoskeletal: Negative.   Neurological: Negative.   Hematological: Negative.   Psychiatric/Behavioral: Negative.   All  other systems reviewed and are negative.   BP 140/60 mmHg  Pulse 66  Ht 5\' 6"  (1.676 m)  Wt 183 lb (83.008 kg)  BMI 29.55 kg/m2  Physical Exam  Constitutional: She is oriented to person, place, and time. She appears well-developed and well-nourished.  HENT:  Head: Normocephalic.  Nose: Nose normal.  Mouth/Throat: Oropharynx is clear and moist.  Eyes: Conjunctivae are normal. Pupils are equal, round, and reactive to light.  Neck: Normal range of motion. Neck supple. No JVD present.  Cardiovascular: Normal rate, regular rhythm, S1 normal, S2 normal, normal heart sounds and intact distal pulses.  Exam reveals no gallop and no friction rub.   No murmur heard. Pulmonary/Chest: Effort normal and breath sounds normal. No respiratory distress. She has no wheezes. She has no rales. She exhibits no tenderness.  Abdominal: Soft. Bowel sounds are normal. She exhibits no distension. There is no tenderness.  Musculoskeletal: Normal range of motion. She exhibits no edema or tenderness.  Lymphadenopathy:    She  has no cervical adenopathy.  Neurological: She is alert and oriented to person, place, and time. Coordination normal.  Skin: Skin is warm and dry. No rash noted. No erythema.  Psychiatric: She has a normal mood and affect. Her behavior is normal. Judgment and thought content normal.    Assessment and Plan  Nursing note and vitals reviewed.

## 2016-04-21 NOTE — Assessment & Plan Note (Signed)
Unable to tolerate generic Crestor Currently not on a statin. We will monitor her cholesterol in follow-up

## 2016-04-23 ENCOUNTER — Ambulatory Visit: Payer: Medicare Other | Admitting: Cardiovascular Disease

## 2016-05-06 ENCOUNTER — Ambulatory Visit (INDEPENDENT_AMBULATORY_CARE_PROVIDER_SITE_OTHER): Payer: Medicare Other

## 2016-05-06 DIAGNOSIS — E538 Deficiency of other specified B group vitamins: Secondary | ICD-10-CM

## 2016-05-06 MED ORDER — CYANOCOBALAMIN 1000 MCG/ML IJ SOLN
1000.0000 ug | Freq: Once | INTRAMUSCULAR | Status: AC
  Administered 2016-05-06: 1000 ug via INTRAMUSCULAR

## 2016-05-16 ENCOUNTER — Other Ambulatory Visit: Payer: Self-pay | Admitting: Cardiovascular Disease

## 2016-05-28 ENCOUNTER — Ambulatory Visit (INDEPENDENT_AMBULATORY_CARE_PROVIDER_SITE_OTHER): Payer: Medicare Other | Admitting: Internal Medicine

## 2016-05-28 ENCOUNTER — Encounter: Payer: Self-pay | Admitting: Internal Medicine

## 2016-05-28 VITALS — BP 122/80 | HR 76 | Temp 98.4°F | Wt 186.0 lb

## 2016-05-28 DIAGNOSIS — J0141 Acute recurrent pansinusitis: Secondary | ICD-10-CM

## 2016-05-28 MED ORDER — METHYLPREDNISOLONE ACETATE 80 MG/ML IJ SUSP
80.0000 mg | Freq: Once | INTRAMUSCULAR | Status: AC
  Administered 2016-05-28: 80 mg via INTRAMUSCULAR

## 2016-05-28 MED ORDER — AMOXICILLIN 500 MG PO TABS: 1000.0000 mg | ORAL_TABLET | Freq: Two times a day (BID) | ORAL | Status: DC

## 2016-05-28 NOTE — Assessment & Plan Note (Signed)
Recurrent issues Will worsen with allergic asthma symptoms at times Does well with medrol shot Rx for amoxil to fill if worsens

## 2016-05-28 NOTE — Progress Notes (Signed)
Pre visit review using our clinic review tool, if applicable. No additional management support is needed unless otherwise documented below in the visit note. 

## 2016-05-28 NOTE — Addendum Note (Signed)
Addended by: Pilar Grammes on: 05/28/2016 10:41 AM   Modules accepted: Orders

## 2016-05-28 NOTE — Progress Notes (Signed)
Subjective:    Patient ID: Jennifer Schmitt, female    DOB: [birth year], 81 y.o.   MRN: [id number]  HPI Here due to trouble with upper respiratory symptoms  Had major dental work 2 days ago Then noticed more congestion---then worsened yesterday Now feels miserable this morning Takes flonase regularly---and tried left over montelukast today No antihistamines at ENT recommendation (dry mouth)  No fever Some dry cough No SOB No chills or sweats Has sinus pressure  No other Rx for this. Did try benedryl yesterday afternoon--slight help.  Current Outpatient Prescriptions on File Prior to Visit  Medication Sig Dispense Refill  . acetaminophen (TYLENOL) 500 MG tablet Take 1,000 mg by mouth every 4 (four) hours as needed for mild pain.     Marland Kitchen ALPRAZolam (XANAX) 0.25 MG tablet Take 1 tablet (0.25 mg total) by mouth daily as needed for anxiety or sleep. 30 tablet 0  . ARTIFICIAL TEAR OP Apply 1 drop to eye 2 (two) times daily as needed (for dry eyes).     . cholecalciferol (VITAMIN D) 1000 units tablet Take 1,000 Units by mouth daily.    . clobetasol cream (TEMOVATE) AB-123456789 % Apply 1 application topically 2 (two) times daily as needed (for rash).     . cyanocobalamin (,VITAMIN B-12,) 1000 MCG/ML injection Inject 1,000 mcg into the muscle See admin instructions. Pt gets an injection every 34 days.    Marland Kitchen desonide (DESOWEN) 0.05 % lotion Apply 1 application topically 2 (two) times daily as needed (for rash).     Marland Kitchen diltiazem (CARDIZEM) 30 MG tablet Take 1 tablet (30 mg total) by mouth 3 (three) times daily as needed. 30 tablet 6  . fluticasone (FLONASE) 50 MCG/ACT nasal spray Place 1 spray into both nostrils daily.    . folic acid (FOLVITE) 1 MG tablet TAKE 1 TABLET EVERY DAY 90 tablet 1  . ketotifen (ZADITOR) 0.025 % ophthalmic solution Place 1 drop into both eyes daily as needed (for allergies).     . meclizine (ANTIVERT) 25 MG tablet Take 1 tablet (25 mg total) by mouth 3 (three) times daily as  needed for dizziness or nausea. 30 tablet 1  . metoprolol succinate (TOPROL-XL) 25 MG 24 hr tablet TAKE 1 TABLET (25 MG TOTAL) BY MOUTH DAILY. 90 tablet 3  . nystatin cream (MYCOSTATIN) Apply 1 application topically 2 (two) times daily as needed (for rash).    . Probiotic CAPS Take 1 capsule by mouth daily.    . propranolol (INDERAL) 10 MG tablet Take 1 tablet (10 mg total) by mouth 3 (three) times daily as needed. 30 tablet 6  . rivaroxaban (XARELTO) 20 MG TABS tablet Take 1 tablet (20 mg total) by mouth daily with supper. 90 tablet 3  . valsartan-hydrochlorothiazide (DIOVAN-HCT) 80-12.5 MG tablet TAKE 1 TABLET EVERY DAY (Patient taking differently: TAKE 1/2 TABLET EVERY DAY) 90 tablet 1   No current facility-administered medications on file prior to visit.    Allergies  Allergen Reactions  . Actonel [Risedronate Sodium] Nausea And Vomiting  . Boniva [Ibandronic Acid] Nausea And Vomiting  . Fosamax [Alendronate Sodium] Nausea And Vomiting  . Lipitor [Atorvastatin] Other (See Comments)    Reaction:  Muscle aches   . Miacalcin [Calcitonin ([place])] Other (See Comments)    Reaction:  Sneezing   . Talwin [Pentazocine] Other (See Comments)    Reaction:  Hallucinations   . Latex Rash  . Niaspan [Niacin Er] Swelling and Rash    Past Medical History  Diagnosis  Date  . Hypertension   . Hyperlipidemia   . GERD (gastroesophageal reflux disease)   . Allergic rhinitis due to pollen   . Atrial fibrillation ([place])   . PMR (polymyalgia rheumatica) (HCC)   . Osteoarthritis, multiple sites   . Urge incontinence   . Adenomatous colon polyp   . Rheumatic fever   . Degenerative disc disease     cervical and lumbar  . Arrhythmia     A-Fib  . Vertigo   . Hearing loss in right ear     Past Surgical History  Procedure Laterality Date  . Tonsillectomy and adenoidectomy    . Meningitis  1963  . Abdominal hysterectomy    . Breast biopsy Left 2010    negative  . Ankle fracture surgery Left  1996  . Synovectomy Right 1973    elbow  . Spine surgery    . Dexa  11/09/06    normal    Family History  Problem Relation Age of Onset  . Schizophrenia Mother     paranoid  . Heart disease Mother     from psych meds  . Arthritis Mother   . Rheum arthritis Mother   . Cancer Father     prostate  . Arthritis Father   . Diabetes Father     Social History   Social History  . Marital Status: Married    Spouse Name: N/A  . Number of Children: 1  . Years of Education: N/A   Occupational History  . 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 Smoking status: Never Smoker   . Smokeless tobacco: Never Used  . Alcohol Use: Yes  . Drug Use: No  . Sexual Activity: Not on file   Other Topics Concern  . Not on file   Social History Narrative   Retired 2007    1 son in [place] area   Spends part time in [place]      Has living will   Husband then son is health care POA   Not sure about DNR---will keep open resuscitation for now    No tube feedings if cognitively unaware                  Review of Systems  Some recent heavy allergic reactions to bee stings Going to [place] for a week for vacation tomorrow No rash now No vomiting or diarrhea Appetite is okay     Objective:   Physical Exam  Constitutional: She appears well-developed and well-nourished. No distress.  HENT:  Frontal sensitivity Moderate nasal inflammation with some yellow mucus Slight pharyngeal injection TMs normal  Neck: Normal range of motion. Neck supple. No thyromegaly present.  Pulmonary/Chest: Effort normal and breath sounds normal. No respiratory distress. She has no wheezes. She has no rales.  Lymphadenopathy:    She has no cervical adenopathy.          Assessment & Plan:

## 2016-05-30 ENCOUNTER — Telehealth: Payer: Self-pay

## 2016-05-30 NOTE — Telephone Encounter (Signed)
Pt was seen on 05/28/16; pt started the amoxil on 05/29/16 but today has worsened; deep non productive cough with wheezing and SOB talking on phone.No fever. No available appts at [facility] or [facility] and pt said she needs to be seen today. Pt said there is an UC near where she lives and her husband will take her there now. FYI to Dr Silvio Pate.

## 2016-05-31 NOTE — Telephone Encounter (Signed)
Please check on her on Monday 

## 2016-06-02 NOTE — Telephone Encounter (Signed)
Spoke to the patient. She did go to [facility]. She was placed on Levaquin and ProAir inhaler. She is feeling much better. She still has a cough, but nothing like she had. She said the Depo-Medrol drained her sinuses.

## 2016-06-10 ENCOUNTER — Ambulatory Visit (INDEPENDENT_AMBULATORY_CARE_PROVIDER_SITE_OTHER): Payer: Medicare Other

## 2016-06-10 DIAGNOSIS — E538 Deficiency of other specified B group vitamins: Secondary | ICD-10-CM | POA: Diagnosis not present

## 2016-06-10 MED ORDER — CYANOCOBALAMIN 1000 MCG/ML IJ SOLN
1000.0000 ug | Freq: Once | INTRAMUSCULAR | Status: AC
  Administered 2016-06-10: 1000 ug via INTRAMUSCULAR

## 2016-06-27 ENCOUNTER — Ambulatory Visit
Admission: RE | Admit: 2016-06-27 | Discharge: 2016-06-27 | Disposition: A | Discharge status: Home / Self Care | Payer: Medicare Other | Source: Ambulatory Visit | Attending: Unknown Physician Specialty | Admitting: Unknown Physician Specialty

## 2016-06-27 DIAGNOSIS — H9120 Sudden idiopathic hearing loss, unspecified ear: Secondary | ICD-10-CM | POA: Diagnosis present

## 2016-06-27 DIAGNOSIS — R930 Abnormal findings on diagnostic imaging of skull and head, not elsewhere classified: Secondary | ICD-10-CM | POA: Diagnosis not present

## 2016-06-27 DIAGNOSIS — H9121 Sudden idiopathic hearing loss, right ear: Secondary | ICD-10-CM

## 2016-06-27 DIAGNOSIS — R9082 White matter disease, unspecified: Secondary | ICD-10-CM | POA: Diagnosis not present

## 2016-06-27 LAB — POCT I-STAT CREATININE: Creatinine, Ser: 0.8 mg/dL (ref 0.44–1.00)

## 2016-06-27 MED ORDER — GADOBENATE DIMEGLUMINE 529 MG/ML IV SOLN
20.0000 mL | Freq: Once | INTRAVENOUS | Status: AC | PRN
  Administered 2016-06-27: 16 mL via INTRAVENOUS

## 2016-07-11 ENCOUNTER — Ambulatory Visit (INDEPENDENT_AMBULATORY_CARE_PROVIDER_SITE_OTHER): Payer: Medicare Other

## 2016-07-11 DIAGNOSIS — E538 Deficiency of other specified B group vitamins: Secondary | ICD-10-CM | POA: Diagnosis not present

## 2016-07-11 MED ORDER — CYANOCOBALAMIN 1000 MCG/ML IJ SOLN
1000.0000 ug | Freq: Once | INTRAMUSCULAR | Status: AC
  Administered 2016-07-11: 1000 ug via INTRAMUSCULAR

## 2016-08-14 ENCOUNTER — Ambulatory Visit (INDEPENDENT_AMBULATORY_CARE_PROVIDER_SITE_OTHER): Payer: Medicare Other | Admitting: *Deleted

## 2016-08-14 DIAGNOSIS — E538 Deficiency of other specified B group vitamins: Secondary | ICD-10-CM | POA: Diagnosis not present

## 2016-08-14 MED ORDER — CYANOCOBALAMIN 1000 MCG/ML IJ SOLN
1000.0000 ug | Freq: Once | INTRAMUSCULAR | Status: AC
  Administered 2016-08-14: 1000 ug via INTRAMUSCULAR

## 2016-08-28 ENCOUNTER — Other Ambulatory Visit: Payer: Self-pay | Admitting: Internal Medicine

## 2016-09-23 ENCOUNTER — Ambulatory Visit (INDEPENDENT_AMBULATORY_CARE_PROVIDER_SITE_OTHER): Payer: Medicare Other

## 2016-09-23 DIAGNOSIS — E538 Deficiency of other specified B group vitamins: Secondary | ICD-10-CM

## 2016-09-23 MED ORDER — CYANOCOBALAMIN 1000 MCG/ML IJ SOLN
1000.0000 ug | Freq: Once | INTRAMUSCULAR | Status: AC
  Administered 2016-09-23: 1000 ug via INTRAMUSCULAR

## 2016-10-05 ENCOUNTER — Other Ambulatory Visit: Payer: Self-pay | Admitting: Internal Medicine

## 2016-10-15 ENCOUNTER — Encounter: Payer: Self-pay | Admitting: Internal Medicine

## 2016-10-15 ENCOUNTER — Ambulatory Visit (INDEPENDENT_AMBULATORY_CARE_PROVIDER_SITE_OTHER): Payer: Medicare Other | Admitting: Internal Medicine

## 2016-10-15 VITALS — BP 140/78 | HR 65 | Temp 98.0°F | Ht 65.75 in | Wt 185.0 lb

## 2016-10-15 DIAGNOSIS — I480 Paroxysmal atrial fibrillation: Secondary | ICD-10-CM

## 2016-10-15 DIAGNOSIS — G629 Polyneuropathy, unspecified: Secondary | ICD-10-CM

## 2016-10-15 DIAGNOSIS — I10 Essential (primary) hypertension: Secondary | ICD-10-CM

## 2016-10-15 DIAGNOSIS — Z0000 Encounter for general adult medical examination without abnormal findings: Secondary | ICD-10-CM | POA: Diagnosis not present

## 2016-10-15 DIAGNOSIS — M353 Polymyalgia rheumatica: Secondary | ICD-10-CM | POA: Diagnosis not present

## 2016-10-15 DIAGNOSIS — F39 Unspecified mood [affective] disorder: Secondary | ICD-10-CM

## 2016-10-15 DIAGNOSIS — Z7189 Other specified counseling: Secondary | ICD-10-CM

## 2016-10-15 LAB — COMPREHENSIVE METABOLIC PANEL
ALBUMIN: 4 g/dL (ref 3.5–5.2)
ALT: 14 U/L (ref 0–35)
AST: 17 U/L (ref 0–37)
Alkaline Phosphatase: 62 U/L (ref 39–117)
BUN: 18 mg/dL (ref 6–23)
CALCIUM: 9.6 mg/dL (ref 8.4–10.5)
CHLORIDE: 103 meq/L (ref 96–112)
CO2: 30 meq/L (ref 19–32)
CREATININE: 0.83 mg/dL (ref 0.40–1.20)
GFR: 70.46 mL/min (ref >60.00)
Glucose, Bld: 96 mg/dL (ref 70–99)
POTASSIUM: 4.5 meq/L (ref 3.5–5.1)
Sodium: 140 mEq/L (ref 135–145)
Total Bilirubin: 0.4 mg/dL (ref 0.2–1.2)
Total Protein: 7.4 g/dL (ref 6.0–8.3)

## 2016-10-15 LAB — LIPID PANEL
CHOL/HDL RATIO: 3
CHOLESTEROL: 219 mg/dL — AB (ref 0–200)
HDL: 67.6 mg/dL (ref >39.00)
LDL CALC: 133 mg/dL — AB (ref 0–99)
NonHDL: 151.06
Triglycerides: 90 mg/dL (ref 0.0–149.0)
VLDL: 18 mg/dL (ref 0.0–40.0)

## 2016-10-15 LAB — VITAMIN B12: VITAMIN B 12: 220 pg/mL (ref 211–911)

## 2016-10-15 LAB — CBC WITH DIFFERENTIAL/PLATELET
BASOS PCT: 0.5 % (ref 0.0–3.0)
Basophils Absolute: 0 10*3/uL (ref 0.0–0.1)
EOS PCT: 2 % (ref 0.0–5.0)
Eosinophils Absolute: 0.1 10*3/uL (ref 0.0–0.7)
HEMATOCRIT: 37.5 % (ref 36.0–46.0)
HEMOGLOBIN: 12.5 g/dL (ref 12.0–15.0)
LYMPHS PCT: 23.1 % (ref 12.0–46.0)
Lymphs Abs: 1 10*3/uL (ref 0.7–4.0)
MCHC: 33.4 g/dL (ref 30.0–36.0)
MCV: 95.6 fl (ref 78.0–100.0)
MONOS PCT: 10.2 % (ref 3.0–12.0)
Monocytes Absolute: 0.5 10*3/uL (ref 0.1–1.0)
NEUTROS ABS: 2.9 10*3/uL (ref 1.4–7.7)
Neutrophils Relative %: 64.2 % (ref 43.0–77.0)
PLATELETS: 247 10*3/uL (ref 150.0–400.0)
RBC: 3.92 Mil/uL (ref 3.87–5.11)
RDW: 13.5 % (ref 11.5–15.5)
WBC: 4.5 10*3/uL (ref 4.0–10.5)

## 2016-10-15 LAB — SEDIMENTATION RATE: Sed Rate: 21 mm/hr (ref 0–30)

## 2016-10-15 LAB — T4, FREE: Free T4: 0.71 ng/dL (ref 0.60–1.60)

## 2016-10-15 MED ORDER — CYANOCOBALAMIN 1000 MCG/ML IJ SOLN: 1000.0000 ug | INTRAMUSCULAR | 12 refills | Status: DC

## 2016-10-15 NOTE — Addendum Note (Signed)
Addended by: Pilar Grammes on: 10/15/2016 09:30 AM   Modules accepted: Orders

## 2016-10-15 NOTE — Assessment & Plan Note (Signed)
BP Readings from Last 3 Encounters:  10/15/16 140/78  05/28/16 122/80  04/21/16 140/60   Acceptable for age

## 2016-10-15 NOTE — Assessment & Plan Note (Signed)
Mild anxiety and sleep problems at times Rarely needs the alprazolam

## 2016-10-15 NOTE — Progress Notes (Signed)
Subjective:    Patient ID: Jennifer Schmitt, female    DOB: June 05, 1935, 81 y.o.   MRN: [id number]  HPI Here for Medicare wellness visit and follow up of chronic health conditions Reviewed form and advanced directives Reviewed other doctors No tobacco Occasional alcohol Tries to exercise regularly No falls in many years. No depression or anhedonia Vision is okay--but advancing cataracts (not ready for surgery yet) Chronic hearing problems Independent with instrumental ADLs Mild short term memory issues. No functional problems  Has noted frequent urination--worse lately Wonders about her sugar  Does have some achiness Not like her worst though No positive trigger points Awakens with headache--goes away on her own  Mood generally okay Will get nervous or have rare sleep problems Hasn't needed the alprazolam in a while (1/2)  No recent problems with atrial fib ~3-4 weeks ago--had slight feeling Does note irregularlity at times No chest pain No SOB Stable exercise tolerance No syncope  Still gets dizzy feeling at times--off balance if she turns Still sees Dr Tami Ribas Trouble with hearing--- deaf on right  Has mild neuropathy in feet Just tingling--not pain  Current Outpatient Prescriptions on File Prior to Visit  Medication Sig Dispense Refill  . acetaminophen (TYLENOL) 500 MG tablet Take 1,000 mg by mouth every 4 (four) hours as needed for mild pain.     Marland Kitchen ALPRAZolam (XANAX) 0.25 MG tablet Take 1 tablet (0.25 mg total) by mouth daily as needed for anxiety or sleep. 30 tablet 0  . ARTIFICIAL TEAR OP Apply 1 drop to eye 2 (two) times daily as needed (for dry eyes).     . cholecalciferol (VITAMIN D) 1000 units tablet Take 1,000 Units by mouth daily.    . cyanocobalamin (,VITAMIN B-12,) 1000 MCG/ML injection Inject 1,000 mcg into the muscle See admin instructions. Pt gets an injection every 34 days.    Marland Kitchen desonide (DESOWEN) 0.05 % lotion Apply 1 application topically 2  (two) times daily as needed (for rash).     Marland Kitchen diltiazem (CARDIZEM) 30 MG tablet Take 1 tablet (30 mg total) by mouth 3 (three) times daily as needed. 30 tablet 6  . fluticasone (FLONASE) 50 MCG/ACT nasal spray Place 1 spray into both nostrils daily.    . folic acid (FOLVITE) 1 MG tablet TAKE 1 TABLET EVERY DAY 90 tablet 0  . ketotifen (ZADITOR) 0.025 % ophthalmic solution Place 1 drop into both eyes daily as needed (for allergies).     . meclizine (ANTIVERT) 25 MG tablet Take 1 tablet (25 mg total) by mouth 3 (three) times daily as needed for dizziness or nausea. 30 tablet 1  . metoprolol succinate (TOPROL-XL) 25 MG 24 hr tablet TAKE 1 TABLET (25 MG TOTAL) BY MOUTH DAILY. 90 tablet 3  . nystatin cream (MYCOSTATIN) Apply 1 application topically 2 (two) times daily as needed (for rash).    . Probiotic CAPS Take 1 capsule by mouth daily.    . propranolol (INDERAL) 10 MG tablet Take 1 tablet (10 mg total) by mouth 3 (three) times daily as needed. 30 tablet 6  . rivaroxaban (XARELTO) 20 MG TABS tablet Take 1 tablet (20 mg total) by mouth daily with supper. 90 tablet 3  . valsartan-hydrochlorothiazide (DIOVAN-HCT) 80-12.5 MG tablet TAKE 1 TABLET EVERY DAY 90 tablet 0  . clobetasol cream (TEMOVATE) AB-123456789 % Apply 1 application topically 2 (two) times daily as needed (for rash).      No current facility-administered medications on file prior to visit.  Allergies  Allergen Reactions  . Actonel [Risedronate Sodium] Nausea And Vomiting  . Boniva [Ibandronic Acid] Nausea And Vomiting  . Fosamax [Alendronate Sodium] Nausea And Vomiting  . Lipitor [Atorvastatin] Other (See Comments)    Reaction:  Muscle aches   . Miacalcin [Calcitonin ([place])] Other (See Comments)    Reaction:  Sneezing   . Talwin [Pentazocine] Other (See Comments)    Reaction:  Hallucinations   . Latex Rash  . Niaspan [Niacin Er] Swelling and Rash    Past Medical History:  Diagnosis Date  . Adenomatous colon polyp   .  Allergic rhinitis due to pollen   . Arrhythmia    A-Fib  . Atrial fibrillation ([place])   . Degenerative disc disease    cervical and lumbar  . GERD (gastroesophageal reflux disease)   . Hearing loss in right ear   . Hyperlipidemia   . Hypertension   . Osteoarthritis, multiple sites   . PMR (polymyalgia rheumatica) (HCC)   . Rheumatic fever   . Urge incontinence   . Vertigo     Past Surgical History:  Procedure Laterality Date  . ABDOMINAL HYSTERECTOMY    . ANKLE FRACTURE SURGERY Left 1996  . BREAST BIOPSY Left 2010   negative  . DEXA  11/09/06   normal  . Meningitis  1963  . SPINE SURGERY    . SYNOVECTOMY Right 1973   elbow  . TONSILLECTOMY AND ADENOIDECTOMY      Family History  Problem Relation Age of Onset  . Schizophrenia Mother     paranoid  . Heart disease Mother     from psych meds  . Arthritis Mother   . Rheum arthritis Mother   . Cancer Father     prostate  . Arthritis Father   . Diabetes Father     Social History   Social History  . Marital status: Married    Spouse name: N/A  . Number of children: 1  . Years of education: N/A   Occupational History  . 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 Smoking status: Never Smoker  . Smokeless tobacco: Never Used  . Alcohol use Yes  . Drug use: No  . Sexual activity: Not on file   Other Topics Concern  . Not on file   Social History Narrative   Retired 2007    1 son in [place] area   Spends part time in [place]      Has living will   Husband then son is health care POA   Not sure about DNR---will keep open resuscitation for now    No tube feedings if cognitively unaware                  Review of Systems Appetite is good Weight is stable Some nightmares lately---some headaches at night as well Wears seat belt Teeth okay--will need some crowns ([place]) No sig back or arthritis pain--more of just aching IBS  symptoms--- no meds. Some blood from hemorrhoids Recent skin exam--- Dr Ledell Peoples office yearly No heartburn or dysphagia Occasional cough--may be from something in throat (like pepper)    Objective:   Physical Exam  Constitutional: She is oriented to person, place, and time. She appears well-developed and well-nourished. No distress.  HENT:  Mouth/Throat: Oropharynx is clear and moist. No oropharyngeal exudate.  Neck: Normal range of motion. No thyromegaly present.  Cardiovascular: Normal rate, regular rhythm  and normal heart sounds.  Exam reveals no gallop.   No murmur heard. Pulmonary/Chest: Effort normal and breath sounds normal. No respiratory distress. She has no wheezes. She has no rales.  Abdominal: Soft. There is no tenderness.  Musculoskeletal: She exhibits no edema or tenderness.  Lymphadenopathy:    She has no cervical adenopathy.  Neurological: She is alert and oriented to person, place, and time.  President-- "Trump, Obama, Clinton---- Bush" [id number] (85)" D-[id number] Recall 3/3  Skin: No rash noted. No erythema.  Psychiatric: She has a normal mood and affect. Her behavior is normal.          Assessment & Plan:

## 2016-10-15 NOTE — Assessment & Plan Note (Signed)
Regular now On the xarelto

## 2016-10-15 NOTE — Assessment & Plan Note (Signed)
See social history 

## 2016-10-15 NOTE — Assessment & Plan Note (Signed)
I have personally reviewed the Medicare Annual Wellness questionnaire and have noted 1. The patient's medical and social history 2. Their use of alcohol, tobacco or illicit drugs 3. Their current medications and supplements 4. The patient's functional ability including ADL's, fall risks, home safety risks and hearing or visual             impairment. 5. Diet and physical activities 6. Evidence for depression or mood disorders  The patients weight, height, BMI and visual acuity have been recorded in the chart I have made referrals, counseling and provided education to the patient based review of the above and I have provided the pt with a written personalized care plan for preventive services.  I have provided you with a copy of your personalized plan for preventive services. Please take the time to review along with your updated medication list.  No more cancer screening Works on fitness Had flu vaccine

## 2016-10-15 NOTE — Assessment & Plan Note (Signed)
Has some aching but doesn't sound like PMR Will check sed rate

## 2016-10-15 NOTE — Assessment & Plan Note (Signed)
May be from past B12 deficiency No pain so no Rx

## 2016-10-15 NOTE — Patient Instructions (Addendum)
Please call [facility] for an appointment about the pelvic sling.  DASH Eating Plan DASH stands for "Dietary Approaches to Stop Hypertension." The DASH eating plan is a healthy eating plan that has been shown to reduce high blood pressure (hypertension). Additional health benefits may include reducing the risk of type 2 diabetes mellitus, heart disease, and stroke. The DASH eating plan may also help with weight loss. WHAT DO I NEED TO KNOW ABOUT THE DASH EATING PLAN? For the DASH eating plan, you will follow these general guidelines:  Choose foods with a percent daily value for sodium of less than 5% (as listed on the food label).  Use salt-free seasonings or herbs instead of table salt or sea salt.  Check with your health care provider or pharmacist before using salt substitutes.  Eat lower-sodium products, often labeled as "lower sodium" or "no salt added."  Eat fresh foods.  Eat more vegetables, fruits, and low-fat dairy products.  Choose whole grains. Look for the word "whole" as the first word in the ingredient list.  Choose fish and skinless chicken or [place] more often than red meat. Limit fish, poultry, and meat to 6 oz (170 g) each day.  Limit sweets, desserts, sugars, and sugary drinks.  Choose heart-healthy fats.  Limit cheese to 1 oz (28 g) per day.  Eat more home-cooked food and less restaurant, buffet, and fast food.  Limit fried foods.  Cook foods using methods other than frying.  Limit canned vegetables. If you do use them, rinse them well to decrease the sodium.  When eating at a restaurant, ask that your food be prepared with less salt, or no salt if possible. WHAT FOODS CAN I EAT? Seek help from a dietitian for individual calorie needs. Grains Whole grain or whole wheat bread. Brown rice. Whole grain or whole wheat pasta. Quinoa, bulgur, and whole grain cereals. Low-sodium cereals. Corn or whole wheat flour tortillas. Whole grain cornbread. Whole grain  crackers. Low-sodium crackers. Vegetables Fresh or frozen vegetables (raw, steamed, roasted, or grilled). Low-sodium or reduced-sodium tomato and vegetable juices. Low-sodium or reduced-sodium tomato sauce and paste. Low-sodium or reduced-sodium canned vegetables.  Fruits All fresh, canned (in natural juice), or frozen fruits. Meat and Other Protein Products Ground beef (85% or leaner), grass-fed beef, or beef trimmed of fat. Skinless chicken or [place]. Ground chicken or [place]. Pork trimmed of fat. All fish and seafood. Eggs. Dried beans, peas, or lentils. Unsalted nuts and seeds. Unsalted canned beans. Dairy Low-fat dairy products, such as skim or 1% milk, 2% or reduced-fat cheeses, low-fat ricotta or cottage cheese, or plain low-fat yogurt. Low-sodium or reduced-sodium cheeses. Fats and Oils Tub margarines without trans fats. Light or reduced-fat mayonnaise and salad dressings (reduced sodium). Avocado. Safflower, olive, or canola oils. Natural peanut or almond butter. Other Unsalted popcorn and pretzels. The items listed above may not be a complete list of recommended foods or beverages. Contact your dietitian for more options. WHAT FOODS ARE NOT RECOMMENDED? Grains White bread. White pasta. White rice. Refined cornbread. Bagels and croissants. Crackers that contain trans fat. Vegetables Creamed or fried vegetables. Vegetables in a cheese sauce. Regular canned vegetables. Regular canned tomato sauce and paste. Regular tomato and vegetable juices. Fruits Dried fruits. Canned fruit in light or heavy syrup. Fruit juice. Meat and Other Protein Products Fatty cuts of meat. Ribs, chicken wings, bacon, sausage, bologna, salami, chitterlings, fatback, hot dogs, bratwurst, and packaged luncheon meats. Salted nuts and seeds. Canned beans with salt. Dairy Whole or  2% milk, cream, half-and-half, and cream cheese. Whole-fat or sweetened yogurt. Full-fat cheeses or blue cheese. Nondairy creamers and  whipped toppings. Processed cheese, cheese spreads, or cheese curds. Condiments Onion and garlic salt, seasoned salt, table salt, and sea salt. Canned and packaged gravies. Worcestershire sauce. Tartar sauce. Barbecue sauce. Teriyaki sauce. Soy sauce, including reduced sodium. Steak sauce. Fish sauce. Oyster sauce. Cocktail sauce. Horseradish. Ketchup and mustard. Meat flavorings and tenderizers. Bouillon cubes. Hot sauce. Tabasco sauce. Marinades. Taco seasonings. Relishes. Fats and Oils Butter, stick margarine, lard, shortening, ghee, and bacon fat. Coconut, palm kernel, or palm oils. Regular salad dressings. Other Pickles and olives. Salted popcorn and pretzels. The items listed above may not be a complete list of foods and beverages to avoid. Contact your dietitian for more information. WHERE CAN I FIND MORE INFORMATION? National Heart, Lung, and Blood Institute: travelstabloid.com   This information is not intended to replace advice given to you by your health care provider. Make sure you discuss any questions you have with your health care provider.   Document Released: 11/27/2011 Document Revised: 12/29/2014 Document Reviewed: 10/12/2013 Elsevier Interactive Patient Education [employer].

## 2016-10-20 ENCOUNTER — Encounter: Payer: Self-pay | Admitting: Cardiovascular Disease

## 2016-10-20 ENCOUNTER — Ambulatory Visit (INDEPENDENT_AMBULATORY_CARE_PROVIDER_SITE_OTHER): Payer: Medicare Other | Admitting: Cardiovascular Disease

## 2016-10-20 VITALS — BP 140/70 | HR 70 | Ht 66.0 in | Wt 185.8 lb

## 2016-10-20 DIAGNOSIS — I10 Essential (primary) hypertension: Secondary | ICD-10-CM

## 2016-10-20 DIAGNOSIS — E7800 Pure hypercholesterolemia, unspecified: Secondary | ICD-10-CM

## 2016-10-20 DIAGNOSIS — Z7189 Other specified counseling: Secondary | ICD-10-CM

## 2016-10-20 DIAGNOSIS — I480 Paroxysmal atrial fibrillation: Secondary | ICD-10-CM

## 2016-10-20 MED ORDER — ROSUVASTATIN CALCIUM 5 MG PO TABS: 5.0000 mg | ORAL_TABLET | Freq: Every day | ORAL | 3 refills | Status: DC

## 2016-10-20 NOTE — Progress Notes (Signed)
Cardiology Office Note  Date:  10/20/2016   ID:  Jennifer Schmitt 27-Oct-1935, MRN [id number]  PCP:  [name], MD   Chief Complaint  Patient presents with  . Hypertension  . Atrial Fibrillation    HPI:  Jennifer Schmitt is a very pleasant 81 year old woman with long history of paroxysmal atrial fibrillation, obstructive sleep apnea who does not wear CPAP, hypertension, hyperlipidemia who presents for routine followup of her atrial fibrillation. She spends much of her winter in [place], has a cardiologist there  In follow-up today she reports that she is doing well Reports that she only had one very brief episode of atrial fibrillation  Lasted for several seconds  Otherwise denies any significant issues Stopped her cholesterol pill on her own, felt like she was having muscle ache  Long discussion concerning her family history Parents with no CAD, no MI Grandmother with CHF  Carotid u/s 2011: intimal thickening She reports that she had recent screening, was told she had no significant carotid disease. She will bring in the report for our review  She does have diltiazem and propranolol to take as needed for tachycardia History of chronic dizziness, vertigo symptoms.  Previously changed from Crestor to generic rosuvastatin, developed leg cramping, had to stop the medication Had similar problems on Lipitor Prior total cholesterol 161, now up to 220  EKG on today's visit shows normal sinus rhythm with rate 63 bpm, no significant ST or T-wave changes  Other past medical history reviewed 05/31/2015 had severe vomiting, went to the emergency room Electrolytes were normal, no atrial fibrillation Lab work reviewed with her today from recent lab draw, total cholesterol 160 B-12 improving, sedimentation rate 40  She reports having an episode of atrial fibrillation in August 2015 It lasted approximately 6 hours. She took diltiazem and propranolol.  she converted back to normal  sinus rhythm Some restless nights, possibly from sleep apnea. History of headaches  Previously reported palpitations at night, very short-lived   PMH:   has a past medical history of Adenomatous colon polyp; Allergic rhinitis due to pollen; Arrhythmia; Atrial fibrillation ([place]); Degenerative disc disease; GERD (gastroesophageal reflux disease); Hearing loss in right ear; Hyperlipidemia; Hypertension; Osteoarthritis, multiple sites; PMR (polymyalgia rheumatica) ([place]); Rheumatic fever; Urge incontinence; and Vertigo.  PSH:    Past Surgical History:  Procedure Laterality Date  . ABDOMINAL HYSTERECTOMY    . ANKLE FRACTURE SURGERY Left 1996  . BREAST BIOPSY Left 2010   negative  . DEXA  11/09/06   normal  . Meningitis  1963  . SPINE SURGERY    . SYNOVECTOMY Right 1973   elbow  . TONSILLECTOMY AND ADENOIDECTOMY      Current Outpatient Prescriptions  Medication Sig Dispense Refill  . acetaminophen (TYLENOL) 500 MG tablet Take 1,000 mg by mouth every 4 (four) hours as needed for mild pain.     Marland Kitchen ALPRAZolam (XANAX) 0.25 MG tablet Take 1 tablet (0.25 mg total) by mouth daily as needed for anxiety or sleep. 30 tablet 0  . ARTIFICIAL TEAR OP Apply 1 drop to eye 2 (two) times daily as needed (for dry eyes).     . cholecalciferol (VITAMIN D) 1000 units tablet Take 1,000 Units by mouth daily.    . clobetasol cream (TEMOVATE) [id number] % Apply 1 application topically 2 (two) times daily as needed (for rash).     . cyanocobalamin (,VITAMIN B-12,) 1000 MCG/ML injection Inject 1 mL (1,000 mcg total) into the muscle every 30 (thirty) days. Pt gets  an injection every 30 days 1 mL 12  . desonide (DESOWEN) 0.05 % lotion Apply 1 application topically 2 (two) times daily as needed (for rash).     Marland Kitchen diltiazem (CARDIZEM) 30 MG tablet Take 1 tablet (30 mg total) by mouth 3 (three) times daily as needed. 30 tablet 6  . fluticasone (FLONASE) 50 MCG/ACT nasal spray Place 1 spray into both nostrils daily.    . folic  acid (FOLVITE) 1 MG tablet TAKE 1 TABLET EVERY DAY 90 tablet 0  . ipratropium (ATROVENT) 0.06 % nasal spray     . levocetirizine (XYZAL) 5 MG tablet Take 2.5 mg by mouth every evening.    . meclizine (ANTIVERT) 25 MG tablet Take 1 tablet (25 mg total) by mouth 3 (three) times daily as needed for dizziness or nausea. 30 tablet 1  . metoprolol succinate (TOPROL-XL) 25 MG 24 hr tablet TAKE 1 TABLET (25 MG TOTAL) BY MOUTH DAILY. 90 tablet 3  . nystatin cream (MYCOSTATIN) Apply 1 application topically 2 (two) times daily as needed (for rash).    . Probiotic CAPS Take 1 capsule by mouth daily.    . propranolol (INDERAL) 10 MG tablet Take 1 tablet (10 mg total) by mouth 3 (three) times daily as needed. 30 tablet 6  . rivaroxaban (XARELTO) 20 MG TABS tablet Take 1 tablet (20 mg total) by mouth daily with supper. 90 tablet 3  . valsartan-hydrochlorothiazide (DIOVAN-HCT) 80-12.5 MG tablet TAKE 1 TABLET EVERY DAY 90 tablet 0  . rosuvastatin (CRESTOR) 5 MG tablet Take 1 tablet (5 mg total) by mouth daily. 90 tablet 3   No current facility-administered medications for this visit.      Allergies:   Actonel [risedronate sodium]; Boniva [ibandronic acid]; Fosamax [alendronate sodium]; Lipitor [atorvastatin]; Miacalcin [calcitonin (salmon)]; Talwin [pentazocine]; Latex; and Niaspan [niacin er]   Social History:  The patient  reports that she has never smoked. She has never used smokeless tobacco. She reports that she drinks alcohol. She reports that she does not use drugs.   Family History:   family history includes Arthritis in her father and mother; Cancer in her father; Diabetes in her father; Heart disease in her mother; Rheum arthritis in her mother; Schizophrenia in her mother.    Review of Systems: Review of Systems  Constitutional: Negative.   Respiratory: Negative.   Cardiovascular: Negative.   Gastrointestinal: Negative.   Musculoskeletal: Negative.   Neurological: Negative.    Psychiatric/Behavioral: Negative.   All other systems reviewed and are negative.    PHYSICAL EXAM: VS:  BP 140/70   Pulse 70   Ht 5\' 6"  (1.676 m)   Wt 185 lb 12.8 oz (84.3 kg)   SpO2 99%   BMI 29.99 kg/m  , BMI Body mass index is 29.99 kg/m. GEN: Well nourished, well developed, in no acute distress  HEENT: normal  Neck: no JVD, carotid bruits, or masses Cardiac: RRR; no murmurs, rubs, or gallops,no edema  Respiratory:  clear to auscultation bilaterally, normal work of breathing GI: soft, nontender, nondistended, + BS Jennifer: no deformity or atrophy  Skin: warm and dry, no rash Neuro:  Strength and sensation are intact Psych: euthymic mood, full affect    Recent Labs: 10/15/2016: ALT 14; BUN 18; Creatinine, Ser 0.83; Hemoglobin 12.5; Platelets 247.0; Potassium 4.5; Sodium 140    Lipid Panel Lab Results  Component Value Date   CHOL 219 (H) 10/15/2016   HDL 67.60 10/15/2016   LDLCALC 133 (H) 10/15/2016   TRIG 90.0  10/15/2016      Wt Readings from Last 3 Encounters:  10/20/16 185 lb 12.8 oz (84.3 kg)  10/15/16 185 lb (83.9 kg)  05/28/16 186 lb (84.4 kg)       ASSESSMENT AND PLAN:  Paroxysmal atrial fibrillation ([place]) - Plan: EKG 12-Lead Rare episodes of arrhythmia, no changes to her medications Tolerating anticoagulation  Essential hypertension - Plan: EKG 12-Lead Blood pressure is well controlled on today's visit. No changes made to the medications.  Pure hypercholesterolemia She is willing to retry Crestor 5 mg every other day at cholesterol jumped up to 220  Encounter for anticoagulation discussion and counseling   Total encounter time more than 15 minutes  Greater than 50% was spent in counseling and coordination of care with the patient   Disposition:   F/U  12 months   Orders Placed This Encounter  Procedures  . EKG 12-Lead     Signed, Esmond Plants, M.D., Ph.D. 10/20/2016  [place], [place]

## 2016-10-20 NOTE — Patient Instructions (Addendum)
Medication Instructions:   Please consider restarting crestor 5 mg every other day  Labwork:  No new labs needed  Testing/Procedures:  No further testing at this time   Follow-Up: It was a pleasure seeing you in the office today. Please call us if you have new issues that need to be addressed before your next appt.  [phone]  Your physician wants you to follow-up in: 12 months.  You will receive a reminder letter in the mail two months in advance. If you don't receive a letter, please call our office to schedule the follow-up appointment.  If you need a refill on your cardiac medications before your next appointment, please call your pharmacy.

## 2017-01-02 ENCOUNTER — Other Ambulatory Visit: Payer: Self-pay | Admitting: Internal Medicine

## 2017-02-15 ENCOUNTER — Other Ambulatory Visit: Payer: Self-pay | Admitting: Internal Medicine

## 2017-02-23 ENCOUNTER — Encounter: Payer: Self-pay | Admitting: Family Medicine

## 2017-02-23 ENCOUNTER — Ambulatory Visit (INDEPENDENT_AMBULATORY_CARE_PROVIDER_SITE_OTHER): Payer: Medicare Other | Admitting: Family Medicine

## 2017-02-23 VITALS — BP 132/80 | HR 74 | Temp 98.6°F | Ht 66.0 in | Wt 187.8 lb

## 2017-02-23 DIAGNOSIS — M1711 Unilateral primary osteoarthritis, right knee: Secondary | ICD-10-CM

## 2017-02-23 DIAGNOSIS — G8929 Other chronic pain: Secondary | ICD-10-CM

## 2017-02-23 DIAGNOSIS — M25561 Pain in right knee: Secondary | ICD-10-CM

## 2017-02-23 MED ORDER — DICLOFENAC SODIUM 1 % TD GEL: 4.0000 g | Freq: Four times a day (QID) | TRANSDERMAL | 11 refills | Status: DC

## 2017-02-23 NOTE — Progress Notes (Signed)
Pre visit review using our clinic review tool, if applicable. No additional management support is needed unless otherwise documented below in the visit note. 

## 2017-02-23 NOTE — Progress Notes (Signed)
Dr. Frederico Hamman T. Baylen Dea, MD, [facility] [place], [address] Phone: [phone] Fax: [phone]  02/23/2017  Patient: Jennifer Schmitt, MRN: [id number], DOB: 09-16-1935, 82 y.o.  Primary Physician:  [name], MD   Chief Complaint  Patient presents with  . Knee Pain   Subjective:   Jennifer Schmitt is a 82 y.o. very pleasant female patient who presents with the following:  R knee pain on xarelto. Known OA   S/p injection end of January 2018 in [place]. She saw a total joint surgeon in [place] who told her that she had "bone on bone" arthritis and needed a knee replacement. This contrasts with our relatively recently films from less than 1 year ago.  Started to flare up in October, used a new bicycle. Then got  A lot more pain. Walker for a few weeks.   Has tried some gel.  Lidocaine gel. This has helped a lot.   She has not tried: Topical voltaren gel.  Capsaicin.   Synvisc. Never tried.   Past Medical History, Surgical History, Social History, Family History, Problem List, Medications, and Allergies have been reviewed and updated if relevant.  Patient Active Problem List   Diagnosis Date Noted  . Neuropathy ([place]) 10/15/2016  . Acute recurrent pansinusitis 05/28/2016  . Right knee pain 04/01/2016  . Rib pain on left side 11/26/2015  . Vertigo of central origin of right ear 06/05/2015  . Pernicious anemia 04/11/2015  . Mild mood disorder ([place]) 04/11/2015  . Encounter for anticoagulation discussion and counseling 03/27/2015  . Visit for preventive health examination 10/10/2014  . Advanced directives, counseling/discussion 10/10/2014  . Allergic sinusitis 09/14/2014  . Obstructive sleep apnea 04/20/2014  . Hypertension   . Hyperlipidemia   . GERD (gastroesophageal reflux disease)   . Allergic rhinitis due to pollen   . Atrial fibrillation ([place])   . PMR (polymyalgia rheumatica) ([facility])   .  Osteoarthritis, multiple sites   . Degenerative disc disease     Past Medical History:  Diagnosis Date  . Adenomatous colon polyp   . Allergic rhinitis due to pollen   . Arrhythmia    A-Fib  . Atrial fibrillation ([place])   . Degenerative disc disease    cervical and lumbar  . GERD (gastroesophageal reflux disease)   . Hearing loss in right ear   . Hyperlipidemia   . Hypertension   . Osteoarthritis, multiple sites   . PMR (polymyalgia rheumatica) ([facility])   . Rheumatic fever   . Urge incontinence   . Vertigo     Past Surgical History:  Procedure Laterality Date  . ABDOMINAL HYSTERECTOMY    . ANKLE FRACTURE SURGERY Left 1996  . BREAST BIOPSY Left 2010   negative  . DEXA  11/09/06   normal  . Meningitis  1963  . SPINE SURGERY    . SYNOVECTOMY Right 1973   elbow  . TONSILLECTOMY AND ADENOIDECTOMY      Social History   Social History  . Marital status: Married    Spouse name: N/A  . Number of children: 1  . Years of education: N/A   Occupational History  . 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 Smoking status: Never Smoker  . Smokeless tobacco: Never Used  . Alcohol use Yes  . Drug use: No  . Sexual activity: Not on file  Other Topics Concern  . Not on file   Social History Narrative   Retired 2007    1 son in [place] area   Spends part time in [place]      Has living will   Husband then son is health care POA   Not sure about DNR---will keep open resuscitation for now    No tube feedings if cognitively unaware                   Family History  Problem Relation Age of Onset  . Schizophrenia Mother     paranoid  . Heart disease Mother     from psych meds  . Arthritis Mother   . Rheum arthritis Mother   . Cancer Father     prostate  . Arthritis Father   . Diabetes Father     Allergies  Allergen Reactions  . Actonel [Risedronate Sodium] Nausea And Vomiting  .  Boniva [Ibandronic Acid] Nausea And Vomiting  . Fosamax [Alendronate Sodium] Nausea And Vomiting  . Lipitor [Atorvastatin] Other (See Comments)    Reaction:  Muscle aches   . Miacalcin [Calcitonin ([place])] Other (See Comments)    Reaction:  Sneezing   . Talwin [Pentazocine] Other (See Comments)    Reaction:  Hallucinations   . Latex Rash  . Niaspan [Niacin Er] Swelling and Rash    Medication list reviewed and updated in full in [place].  GEN: No fevers, chills. Nontoxic. Primarily MSK c/o today. MSK: Detailed in the HPI GI: tolerating PO intake without difficulty Neuro: No numbness, parasthesias, or tingling associated. Otherwise the pertinent positives of the ROS are noted above.   Objective:   BP 132/80   Pulse 74   Temp 98.6 F (37 C) (Oral)   Ht 5\' 6"  (1.676 m)   Wt 187 lb 12 oz (85.2 kg)   BMI 30.30 kg/m    GEN: WDWN, NAD, Non-toxic, Alert & Oriented x 3 HEENT: Atraumatic, Normocephalic.  Ears and Nose: No external deformity. EXTR: No clubbing/cyanosis/edema NEURO: Normal gait.  PSYCH: Normally interactive. Conversant. Not depressed or anxious appearing.  Calm demeanor.   Knee:  R Gait: Normal heel toe pattern ROM: lacks 2 deg ext, flexion to 115 Effusion: minimal Echymosis or edema: none Patellar tendon NT Painful PLICA: neg Patellar grind: negative Medial and lateral patellar facet loading: negative medial and lateral joint lines: medial pain Mcmurray's neg Flexion-pinch minor pain only Varus and valgus stress: stable Lachman: neg Ant and Post drawer: neg Hip abduction, IR, ER: WNL Hip flexion str: 5/5 Hip abd: 5/5 Quad: 5/5 VMO atrophy:No Hamstring concentric and eccentric: 5/5   Radiology: CLINICAL DATA:  Right knee pain.  Probable osteoarthritis  EXAM: RIGHT KNEE 3 VIEWS  COMPARISON:  None.  FINDINGS: Degenerative changes with joint space narrowing and spurring, most pronounced in the patellofemoral compartment. No acute  bony abnormality. Specifically, no fracture, subluxation, or dislocation. Soft tissues are intact. No joint effusion  IMPRESSION: Degenerative changes, most pronounced in the patellofemoral compartment with moderate to severe changes. No acute bony abnormality.   Electronically Signed   By: Rolm Baptise M.D.   On: 04/07/2016 11:23  Assessment and Plan:   Primary osteoarthritis of right knee  Chronic pain of right knee  Discussion of OA. Cont conservative measures - lidocaine, try capsaicin. Voltaren gel.  She is going to think about inj hyaluronic acid.   Follow-up: when needed  Meds ordered this encounter  Medications  . diclofenac sodium (  VOLTAREN) 1 % GEL    Sig: Apply 4 g topically 4 (four) times daily.    Dispense:  5 Tube    Refill:  11   Medications Discontinued During This Encounter  Medication Reason  . levocetirizine (XYZAL) 5 MG tablet Completed Course   No orders of the defined types were placed in this encounter.   Signed,  Maud Deed. Jorge Amparo, MD   Allergies as of 02/23/2017      Reactions   Actonel [risedronate Sodium] Nausea And Vomiting   Boniva [ibandronic Acid] Nausea And Vomiting   Fosamax [alendronate Sodium] Nausea And Vomiting   Lipitor [atorvastatin] Other (See Comments)   Reaction:  Muscle aches    Miacalcin [calcitonin (salmon)] Other (See Comments)   Reaction:  Sneezing    Talwin [pentazocine] Other (See Comments)   Reaction:  Hallucinations    Latex Rash   Niaspan [niacin Er] Swelling, Rash      Medication List       Accurate as of 02/23/17 11:59 PM. Always use your most recent med list.          acetaminophen 500 MG tablet Commonly known as:  TYLENOL Take 1,000 mg by mouth every 4 (four) hours as needed for mild pain.   ALPRAZolam 0.25 MG tablet Commonly known as:  XANAX Take 1 tablet (0.25 mg total) by mouth daily as needed for anxiety or sleep.   ARTIFICIAL TEAR OP Apply 1 drop to eye 2 (two) times daily as  needed (for dry eyes).   cholecalciferol 1000 units tablet Commonly known as:  VITAMIN D Take 1,000 Units by mouth daily.   clobetasol cream 0.05 % Commonly known as:  TEMOVATE Apply 1 application topically 2 (two) times daily as needed (for rash).   cyanocobalamin 1000 MCG/ML injection Commonly known as:  (VITAMIN B-12) Inject 1 mL (1,000 mcg total) into the muscle every 30 (thirty) days. Pt gets an injection every 30 days   desonide 0.05 % lotion Commonly known as:  DESOWEN Apply 1 application topically 2 (two) times daily as needed (for rash).   diclofenac sodium 1 % Gel Commonly known as:  VOLTAREN Apply 4 g topically 4 (four) times daily.   diltiazem 30 MG tablet Commonly known as:  CARDIZEM Take 1 tablet (30 mg total) by mouth 3 (three) times daily as needed.   FLONASE 50 MCG/ACT nasal spray Generic drug:  fluticasone Place 1 spray into both nostrils daily.   folic acid 1 MG tablet Commonly known as:  FOLVITE TAKE 1 TABLET EVERY DAY   ipratropium 0.06 % nasal spray Commonly known as:  ATROVENT   meclizine 25 MG tablet Commonly known as:  ANTIVERT Take 1 tablet (25 mg total) by mouth 3 (three) times daily as needed for dizziness or nausea.   metoprolol succinate 25 MG 24 hr tablet Commonly known as:  TOPROL-XL TAKE 1 TABLET (25 MG TOTAL) BY MOUTH DAILY.   nystatin cream Commonly known as:  MYCOSTATIN Apply 1 application topically 2 (two) times daily as needed (for rash).   Probiotic Caps Take 1 capsule by mouth daily.   propranolol 10 MG tablet Commonly known as:  INDERAL Take 1 tablet (10 mg total) by mouth 3 (three) times daily as needed.   rivaroxaban 20 MG Tabs tablet Commonly known as:  XARELTO Take 1 tablet (20 mg total) by mouth daily with supper.   rosuvastatin 5 MG tablet Commonly known as:  CRESTOR Take 1 tablet (5 mg total) by mouth daily.  valsartan-hydrochlorothiazide 80-12.5 MG tablet Commonly known as:  DIOVAN-HCT TAKE 1 TABLET  EVERY DAY

## 2017-02-24 ENCOUNTER — Telehealth: Payer: Self-pay | Admitting: *Deleted

## 2017-02-24 NOTE — Telephone Encounter (Signed)
Received fax from [facility] requesting PA for Diclofenac Gel.  PA completed on CoverMyMeds.  PA sent for review.  Can take up to 72 hours to get a response.

## 2017-02-26 NOTE — Telephone Encounter (Signed)
PA for diclofenac gel approved through 12/21/2017.  [facility] notified of approval via fax.

## 2017-03-30 ENCOUNTER — Encounter: Payer: Self-pay | Admitting: Internal Medicine

## 2017-03-30 ENCOUNTER — Ambulatory Visit (INDEPENDENT_AMBULATORY_CARE_PROVIDER_SITE_OTHER): Payer: Medicare Other | Admitting: Internal Medicine

## 2017-03-30 VITALS — BP 130/88 | HR 84 | Temp 98.6°F | Resp 18 | Wt 186.0 lb

## 2017-03-30 DIAGNOSIS — J209 Acute bronchitis, unspecified: Secondary | ICD-10-CM

## 2017-03-30 MED ORDER — HYDROCODONE-HOMATROPINE 5-1.5 MG/5ML PO SYRP: 5.0000 mL | ORAL_SOLUTION | Freq: Every evening | ORAL | 0 refills | Status: DC | PRN

## 2017-03-30 NOTE — Progress Notes (Signed)
Subjective:    Patient ID: Jennifer Schmitt, female    DOB: 01/12/1935, 82 y.o.   MRN: [id number]  HPI Here due to respiratory infection  Terrible allergies--- stays on multiple meds (flonase/atrovent-- changed form zyrtec. Some xyzal) Home from [place] 6 weeks ago---pollen changes have been bad Has gotten used to it but then got really bad 3 days ago with bad cough Some chronic drainage but now "sick"  Temperature up slightly--feels flushed (she is normally low) Slight sweat/chills--this is chronic off the hormones Only SOB with coughing jags  Went to [facility] 2 days ago Got prednisone--- and she tried inhaler (just made her dizzy)  Tried delsym this AM---damped the cough slightly  Current Outpatient Prescriptions on File Prior to Visit  Medication Sig Dispense Refill  . acetaminophen (TYLENOL) 500 MG tablet Take 1,000 mg by mouth every 4 (four) hours as needed for mild pain.     Marland Kitchen ALPRAZolam (XANAX) 0.25 MG tablet Take 1 tablet (0.25 mg total) by mouth daily as needed for anxiety or sleep. 30 tablet 0  . ARTIFICIAL TEAR OP Apply 1 drop to eye 2 (two) times daily as needed (for dry eyes).     . cholecalciferol (VITAMIN D) 1000 units tablet Take 1,000 Units by mouth daily.    . cyanocobalamin (,VITAMIN B-12,) 1000 MCG/ML injection Inject 1 mL (1,000 mcg total) into the muscle every 30 (thirty) days. Pt gets an injection every 30 days 1 mL 12  . diltiazem (CARDIZEM) 30 MG tablet Take 1 tablet (30 mg total) by mouth 3 (three) times daily as needed. 30 tablet 6  . fluticasone (FLONASE) 50 MCG/ACT nasal spray Place 1 spray into both nostrils daily.    . folic acid (FOLVITE) 1 MG tablet TAKE 1 TABLET EVERY DAY 90 tablet 3  . ipratropium (ATROVENT) 0.06 % nasal spray     . meclizine (ANTIVERT) 25 MG tablet Take 1 tablet (25 mg total) by mouth 3 (three) times daily as needed for dizziness or nausea. 30 tablet 1  . metoprolol succinate (TOPROL-XL) 25 MG 24 hr tablet TAKE 1 TABLET (25 MG  TOTAL) BY MOUTH DAILY. 90 tablet 3  . nystatin cream (MYCOSTATIN) Apply 1 application topically 2 (two) times daily as needed (for rash).    . Probiotic CAPS Take 1 capsule by mouth daily.    . propranolol (INDERAL) 10 MG tablet Take 1 tablet (10 mg total) by mouth 3 (three) times daily as needed. 30 tablet 6  . rivaroxaban (XARELTO) 20 MG TABS tablet Take 1 tablet (20 mg total) by mouth daily with supper. 90 tablet 3  . rosuvastatin (CRESTOR) 5 MG tablet Take 1 tablet (5 mg total) by mouth daily. (Patient taking differently: Take 5 mg by mouth daily. Every other day) 90 tablet 3  . valsartan-hydrochlorothiazide (DIOVAN-HCT) 80-12.5 MG tablet TAKE 1 TABLET EVERY DAY (Patient taking differently: TAKE 0.5 TABLET EVERY DAY) 90 tablet 2   No current facility-administered medications on file prior to visit.     Allergies  Allergen Reactions  . Actonel [Risedronate Sodium] Nausea And Vomiting  . Boniva [Ibandronic Acid] Nausea And Vomiting  . Diclofenac Other (See Comments)    Voltaren Gel aggravated her sinus passages  . Fosamax [Alendronate Sodium] Nausea And Vomiting  . Lipitor [Atorvastatin] Other (See Comments)    Reaction:  Muscle aches   . Miacalcin [Calcitonin ([place])] Other (See Comments)    Reaction:  Sneezing   . Talwin [Pentazocine] Other (See Comments)  Reaction:  Hallucinations   . Latex Rash  . Niaspan [Niacin Er] Swelling and Rash    Past Medical History:  Diagnosis Date  . Adenomatous colon polyp   . Allergic rhinitis due to pollen   . Arrhythmia    A-Fib  . Atrial fibrillation ([place])   . Degenerative disc disease    cervical and lumbar  . GERD (gastroesophageal reflux disease)   . Hearing loss in right ear   . Hyperlipidemia   . Hypertension   . Osteoarthritis, multiple sites   . PMR (polymyalgia rheumatica) ([facility])   . Rheumatic fever   . Urge incontinence   . Vertigo     Past Surgical History:  Procedure Laterality Date  . ABDOMINAL HYSTERECTOMY    .  ANKLE FRACTURE SURGERY Left 1996  . BREAST BIOPSY Left 2010   negative  . DEXA  11/09/06   normal  . Meningitis  1963  . SPINE SURGERY    . SYNOVECTOMY Right 1973   elbow  . TONSILLECTOMY AND ADENOIDECTOMY      Family History  Problem Relation Age of Onset  . Schizophrenia Mother     paranoid  . Heart disease Mother     from psych meds  . Arthritis Mother   . Rheum arthritis Mother   . Cancer Father     prostate  . Arthritis Father   . Diabetes Father     Social History   Social History  . Marital status: Married    Spouse name: N/A  . Number of children: 1  . Years of education: N/A   Occupational History  . 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 Smoking status: Never Smoker  . Smokeless tobacco: Never Used  . Alcohol use Yes  . Drug use: No  . Sexual activity: Not on file   Other Topics Concern  . Not on file   Social History Narrative   Retired 2007    1 son in [place] area   Spends part time in [place]      Has living will   Husband then son is health care POA   Not sure about DNR---will keep open resuscitation for now    No tube feedings if cognitively unaware                  Review of Systems Reflux now coming out due to cough--better with burp or pepcid Appetite is fine No rash    Objective:   Physical Exam  Constitutional: She appears well-nourished. No distress.  HENT:  No sinus tenderness Moderate (mostly pale) nasal congestion TMs normal  Neck: Neck supple. No thyromegaly present.  Pulmonary/Chest: Effort normal and breath sounds normal. No respiratory distress. She has no wheezes. She has no rales.  Lymphadenopathy:    She has no cervical adenopathy.          Assessment & Plan:

## 2017-03-30 NOTE — Patient Instructions (Signed)
Please call later in the week if you are worsening--instead of improving. You can try fexofenadine for your antihistamine instead of the zyrtec.

## 2017-03-30 NOTE — Progress Notes (Signed)
Pre visit review using our clinic review tool, if applicable. No additional management support is needed unless otherwise documented below in the visit note. 

## 2017-03-30 NOTE — Assessment & Plan Note (Signed)
Still seems to have viral infection Discussed self limited nature and supportive care Cough syrup Antihistamine for the allergies Consider antibiotic if worsens later in the week

## 2017-04-01 ENCOUNTER — Telehealth: Payer: Self-pay

## 2017-04-01 MED ORDER — DOXYCYCLINE HYCLATE 100 MG PO TABS: 100.0000 mg | ORAL_TABLET | Freq: Two times a day (BID) | ORAL | 0 refills | Status: DC

## 2017-04-01 NOTE — Telephone Encounter (Signed)
Patient left a VM stating she was here a couple of days ago for allergies and a URI, she said she was advised to call back if not better. She said she's coughing up from " deep in her chest" brown mucus, she's not better and not sleeping. Please advise thanks.

## 2017-04-01 NOTE — Telephone Encounter (Signed)
Please let her know that I sent a prescription for an antibiotic to her pharmacy

## 2017-04-01 NOTE — Telephone Encounter (Signed)
Spoke to pt and advised. States it has been years since she has taken doxy and is not aware how well she will be able to tolerate it. It advises to take on an empty stomach and pt says she "cant take any meds on an empty stomach." Also, pt is unable to take allegra non drowsy as suggested by Dr Silvio Pate, as she "was up all night." She is wanting Dr Alla German advise on how to proceed. pls advise

## 2017-04-02 ENCOUNTER — Telehealth: Payer: Self-pay | Admitting: Internal Medicine

## 2017-04-02 ENCOUNTER — Ambulatory Visit (INDEPENDENT_AMBULATORY_CARE_PROVIDER_SITE_OTHER): Payer: Medicare Other | Admitting: Internal Medicine

## 2017-04-02 ENCOUNTER — Ambulatory Visit (INDEPENDENT_AMBULATORY_CARE_PROVIDER_SITE_OTHER)
Admission: RE | Admit: 2017-04-02 | Discharge: 2017-04-02 | Disposition: A | Discharge status: Home / Self Care | Payer: Medicare Other | Source: Ambulatory Visit | Attending: Internal Medicine | Admitting: Internal Medicine

## 2017-04-02 ENCOUNTER — Encounter: Payer: Self-pay | Admitting: Internal Medicine

## 2017-04-02 VITALS — BP 138/76 | HR 82 | Temp 98.8°F | Resp 24 | Wt 184.0 lb

## 2017-04-02 DIAGNOSIS — R0602 Shortness of breath: Secondary | ICD-10-CM

## 2017-04-02 MED ORDER — ALBUTEROL SULFATE (2.5 MG/3ML) 0.083% IN NEBU
2.5000 mg | INHALATION_SOLUTION | Freq: Once | RESPIRATORY_TRACT | Status: AC
  Administered 2017-04-02: 2.5 mg via RESPIRATORY_TRACT

## 2017-04-02 MED ORDER — PREDNISONE 20 MG PO TABS
40.0000 mg | ORAL_TABLET | Freq: Every day | ORAL | 0 refills | Status: DC
Start: 2017-04-02 — End: 2017-05-27

## 2017-04-02 NOTE — Progress Notes (Signed)
Pre visit review using our clinic review tool, if applicable. No additional management support is needed unless otherwise documented below in the visit note. 

## 2017-04-02 NOTE — Telephone Encounter (Signed)
Zyrtec or xyzal are fine She can try the proair inhaler ---make sure she uses a spacer (either one from pharmacy or an empty toilet paper roll will also work) She can try OTC DM cough syrup as well If she is not significantly SOB--we can hold off on follow up She should continue the doxy for now unless she can't tolerate it. Just about as good as levaquin and safer.

## 2017-04-02 NOTE — Telephone Encounter (Signed)
Patient Name: Jennifer Schmitt  DOB: 06-08-1935    Initial Comment Caller was seen on Monday for upper respiratory , just had a major coughing spell and is having a hard time catching her breath. Nauseated and just doesn't seem right    Nurse Assessment  Nurse: Raphael Gibney, RN, Vanita Ingles Date/Time (Eastern Time): 04/02/2017 12:46:30 PM  Confirm and document reason for call. If symptomatic, describe symptoms. ---Caller states she was just in the office for upper respiratory infection and started on antibiotics. She started taking doxycycline yesterday. She is having some difficulty breathing. She is having productive cough. Having severe coughing spells. Chest is tight from coughing. BP 179/95. Heart is racing. she just finished 6 days of prednisone. Sounds SOB on the phone.  Does the patient have any new or worsening symptoms? ---Yes  Will a triage be completed? ---Yes  Related visit to physician within the last 2 weeks? ---Yes  Does the PT have any chronic conditions? (i.e. diabetes, asthma, etc.) ---Yes  List chronic conditions. ---Bronchiectiasis; A fib;  Is this a behavioral health or substance abuse call? ---No     Guidelines    Guideline Title Affirmed Question Affirmed Notes  Infection on Antibiotic Follow-up Call MODERATE difficulty breathing (e.g., speaks in phrases, SOB even at rest, pulse 100-120)    Final Disposition User   Go to ED Now (or PCP triage) Raphael Gibney, RN, Vera    Comments  Called back line and spoke to the triage nurse and gave report that pt saw doctor on Monday, started on antibiotics. Antibiotic was changed to doxycycline yesterday. She is coughing. Chest is tight. BP 179/95 and feels as though her heart is racing. Triage outcome of go to ER now (or PCP triage). Pt does not want to go to the ER.   Referrals  GO TO FACILITY REFUSED   Disagree/Comply: Comply

## 2017-04-02 NOTE — Telephone Encounter (Signed)
Pt does not want amoxicillin. Jennifer Schmitt it does not work. She took it last year and ended up somewhere over the weekend. Took Levaquin last year and worked well.   She has gone back to taking Zyrtec or Xyzal.   She is asking what else does she do for the cough? She has not been using the [employer]. Should she try that or a different inhaler?

## 2017-04-02 NOTE — Progress Notes (Signed)
Subjective:    Patient ID: Jennifer Schmitt, female    DOB: 11-Jul-1935, 82 y.o.   MRN: [id number]  HPI Here due to worsening breathing  Had taken the prednisone from urgent care---thinks it may have helped some Took last one this morning Reviewed my visit She did take doxy starting last night Has had some coughing jags---where she had trouble getting her breath Slight lightheadedness  Has tight feeling across upper back Some pain under left shoulder blade Low grade fever for her-- up 1 degree from normal 97 Some ill feeling--not comfortable with mask (lightheadedness?) Didn't like the pro air--but has had nebulizer in office in past with relief  Current Outpatient Prescriptions on File Prior to Visit  Medication Sig Dispense Refill  . acetaminophen (TYLENOL) 500 MG tablet Take 1,000 mg by mouth every 4 (four) hours as needed for mild pain.     Marland Kitchen ALPRAZolam (XANAX) 0.25 MG tablet Take 1 tablet (0.25 mg total) by mouth daily as needed for anxiety or sleep. 30 tablet 0  . ARTIFICIAL TEAR OP Apply 1 drop to eye 2 (two) times daily as needed (for dry eyes).     . cholecalciferol (VITAMIN D) 1000 units tablet Take 1,000 Units by mouth daily.    . cyanocobalamin (,VITAMIN B-12,) 1000 MCG/ML injection Inject 1 mL (1,000 mcg total) into the muscle every 30 (thirty) days. Pt gets an injection every 30 days 1 mL 12  . diltiazem (CARDIZEM) 30 MG tablet Take 1 tablet (30 mg total) by mouth 3 (three) times daily as needed. 30 tablet 6  . doxycycline (VIBRA-TABS) 100 MG tablet Take 1 tablet (100 mg total) by mouth 2 (two) times daily. 14 tablet 0  . fluticasone (FLONASE) 50 MCG/ACT nasal spray Place 1 spray into both nostrils daily.    . folic acid (FOLVITE) 1 MG tablet TAKE 1 TABLET EVERY DAY 90 tablet 3  . ipratropium (ATROVENT) 0.06 % nasal spray     . meclizine (ANTIVERT) 25 MG tablet Take 1 tablet (25 mg total) by mouth 3 (three) times daily as needed for dizziness or nausea. 30 tablet 1  .  metoprolol succinate (TOPROL-XL) 25 MG 24 hr tablet TAKE 1 TABLET (25 MG TOTAL) BY MOUTH DAILY. 90 tablet 3  . nystatin cream (MYCOSTATIN) Apply 1 application topically 2 (two) times daily as needed (for rash).    . predniSONE (DELTASONE) 50 MG tablet Take 50 mg by mouth daily.  0  . Probiotic CAPS Take 1 capsule by mouth daily.    . propranolol (INDERAL) 10 MG tablet Take 1 tablet (10 mg total) by mouth 3 (three) times daily as needed. 30 tablet 6  . rivaroxaban (XARELTO) 20 MG TABS tablet Take 1 tablet (20 mg total) by mouth daily with supper. 90 tablet 3  . rosuvastatin (CRESTOR) 5 MG tablet Take 1 tablet (5 mg total) by mouth daily. (Patient taking differently: Take 5 mg by mouth daily. Every other day) 90 tablet 3  . valsartan-hydrochlorothiazide (DIOVAN-HCT) 80-12.5 MG tablet TAKE 1 TABLET EVERY DAY (Patient taking differently: TAKE 0.5 TABLET EVERY DAY) 90 tablet 2   No current facility-administered medications on file prior to visit.     Allergies  Allergen Reactions  . Actonel [Risedronate Sodium] Nausea And Vomiting  . Boniva [Ibandronic Acid] Nausea And Vomiting  . Diclofenac Other (See Comments)    Voltaren Gel aggravated her sinus passages  . Fosamax [Alendronate Sodium] Nausea And Vomiting  . Lipitor [Atorvastatin] Other (See Comments)  Reaction:  Muscle aches   . Miacalcin [Calcitonin ([place])] Other (See Comments)    Reaction:  Sneezing   . Talwin [Pentazocine] Other (See Comments)    Reaction:  Hallucinations   . Latex Rash  . Niaspan [Niacin Er] Swelling and Rash    Past Medical History:  Diagnosis Date  . Adenomatous colon polyp   . Allergic rhinitis due to pollen   . Arrhythmia    A-Fib  . Atrial fibrillation ([place])   . Degenerative disc disease    cervical and lumbar  . GERD (gastroesophageal reflux disease)   . Hearing loss in right ear   . Hyperlipidemia   . Hypertension   . Osteoarthritis, multiple sites   . PMR (polymyalgia rheumatica) ([facility])   .  Rheumatic fever   . Urge incontinence   . Vertigo     Past Surgical History:  Procedure Laterality Date  . ABDOMINAL HYSTERECTOMY    . ANKLE FRACTURE SURGERY Left 1996  . BREAST BIOPSY Left 2010   negative  . DEXA  11/09/06   normal  . Meningitis  1963  . SPINE SURGERY    . SYNOVECTOMY Right 1973   elbow  . TONSILLECTOMY AND ADENOIDECTOMY      Family History  Problem Relation Age of Onset  . Schizophrenia Mother     paranoid  . Heart disease Mother     from psych meds  . Arthritis Mother   . Rheum arthritis Mother   . Cancer Father     prostate  . Arthritis Father   . Diabetes Father     Social History   Social History  . Marital status: Married    Spouse name: N/A  . Number of children: 1  . Years of education: N/A   Occupational History  . 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 Smoking status: Never Smoker  . Smokeless tobacco: Never Used  . Alcohol use Yes  . Drug use: No  . Sexual activity: Not on file   Other Topics Concern  . Not on file   Social History Narrative   Retired 2007    1 son in [place] area   Spends part time in [place]      Has living will   Husband then son is health care POA   Not sure about DNR---will keep open resuscitation for now    No tube feedings if cognitively unaware                  Review of Systems  Appetite is down No diarrhea Some nausea      Objective:   Physical Exam  Constitutional: She appears well-nourished. No distress.  HENT:  Mouth/Throat: Oropharynx is clear and moist. No oropharyngeal exudate.  Cardiovascular: Normal rate and regular rhythm.  Exam reveals no gallop.   No murmur heard. Pulmonary/Chest:  Tight cough ?slightly decreased breath sounds Normal exp phase No wheeze or crackles          Assessment & Plan:

## 2017-04-02 NOTE — Telephone Encounter (Signed)
I spoke with pt and she has taken 2 doses of doxycycline. Pt said her cough has worsened; started more coughing about 1 hr ago, deeper cough and more often coughing episodes which make pt very SOB; pt said she is SOB all the time but worse with coughing; in talking with pt on phone she was SOB sounding but she was almost continuously coughing. Pt said now BP staying up with systolic at 076. Pt is nauseated but no CP. Pt scheduled appt with Dr Silvio Pate today at 4 PM.FYI to Dr Silvio Pate.

## 2017-04-02 NOTE — Patient Instructions (Signed)
You can try long acting guaifenesin 600mg  twice a day to loosen your cough

## 2017-04-02 NOTE — Telephone Encounter (Signed)
If she prefers, we can send amoxil 500mg   2 tabs bid  #28 x 0 Allegra "non drowsy" probably has a decongestant in it ---which is what usually keeps people up. The regular allegra (just plain fexofenadine) usually doesn't keep people up

## 2017-04-02 NOTE — Addendum Note (Signed)
Addended by: Pilar Grammes on: 04/02/2017 05:04 PM   Modules accepted: Orders

## 2017-04-02 NOTE — Telephone Encounter (Signed)
Will assess at the [place]

## 2017-04-02 NOTE — Telephone Encounter (Signed)
See other phone note

## 2017-04-02 NOTE — Assessment & Plan Note (Addendum)
Having trouble with her breathing that developed during the illness CXR looks okay (LLL atelectasis) Will continue the doxy ---consider change to levaquin if radiologist calls something Picture more compatible with asthmatic bronchitis Will give the prednisone longer guaifenesin to loosen cough Albuterol nebulizer did help cough here She can try the proair with spacer  She will get prior pulmonary records for review

## 2017-04-20 ENCOUNTER — Encounter: Payer: Self-pay | Admitting: Internal Medicine

## 2017-05-16 ENCOUNTER — Other Ambulatory Visit: Payer: Self-pay | Admitting: Cardiovascular Disease

## 2017-05-27 ENCOUNTER — Ambulatory Visit (INDEPENDENT_AMBULATORY_CARE_PROVIDER_SITE_OTHER): Payer: Medicare Other | Admitting: Family

## 2017-05-27 ENCOUNTER — Encounter: Payer: Self-pay | Admitting: Family

## 2017-05-27 VITALS — BP 144/88 | HR 74 | Temp 97.4°F | Resp 16 | Ht 66.0 in | Wt 189.0 lb

## 2017-05-27 DIAGNOSIS — B029 Zoster without complications: Secondary | ICD-10-CM | POA: Insufficient documentation

## 2017-05-27 MED ORDER — VALACYCLOVIR HCL 1 G PO TABS: 1000.0000 mg | ORAL_TABLET | Freq: Three times a day (TID) | ORAL | 0 refills | Status: DC

## 2017-05-27 NOTE — Progress Notes (Signed)
Subjective:    Patient ID: Jennifer Schmitt, female    DOB: [birth year], 82 y.o.   MRN: [id number]  Chief Complaint  Patient presents with  . Rash    possible shingles, on right side right under breast    HPI:  Jennifer Schmitt is a 82 y.o. female who  has a past medical history of Adenomatous colon polyp; Allergic rhinitis due to pollen; Arrhythmia; Atrial fibrillation ([place]); Degenerative disc disease; GERD (gastroesophageal reflux disease); Hearing loss in right ear; Hyperlipidemia; Hypertension; Osteoarthritis, multiple sites; PMR (polymyalgia rheumatica) ([place]); Rheumatic fever; Urge incontinence; and Vertigo. and presents today for an acute office visit.  This is a new problem. Associated symptoms of a rash located under her right breast that has been going on for about 24 hours. Describes itching and bright red with blisters and about 6-8 inches long. Has had Shingles in the past on her abdomen and also had the Zostavax.   Allergies  Allergen Reactions  . Actonel [Risedronate Sodium] Nausea And Vomiting  . Boniva [Ibandronic Acid] Nausea And Vomiting  . Diclofenac Other (See Comments)    Voltaren Gel aggravated her sinus passages  . Fosamax [Alendronate Sodium] Nausea And Vomiting  . Lipitor [Atorvastatin] Other (See Comments)    Reaction:  Muscle aches   . Miacalcin [Calcitonin ([place])] Other (See Comments)    Reaction:  Sneezing   . Talwin [Pentazocine] Other (See Comments)    Reaction:  Hallucinations   . Latex Rash  . Niaspan [Niacin Er] Swelling and Rash      Outpatient Medications Prior to Visit  Medication Sig Dispense Refill  . acetaminophen (TYLENOL) 500 MG tablet Take 1,000 mg by mouth every 4 (four) hours as needed for mild pain.     Marland Kitchen ALPRAZolam (XANAX) 0.25 MG tablet Take 1 tablet (0.25 mg total) by mouth daily as needed for anxiety or sleep. 30 tablet 0  . ARTIFICIAL TEAR OP Apply 1 drop to eye 2 (two) times daily as needed (for dry eyes).     .  cholecalciferol (VITAMIN D) 1000 units tablet Take 1,000 Units by mouth daily.    . cyanocobalamin (,VITAMIN B-12,) 1000 MCG/ML injection Inject 1 mL (1,000 mcg total) into the muscle every 30 (thirty) days. Pt gets an injection every 30 days 1 mL 12  . folic acid (FOLVITE) 1 MG tablet TAKE 1 TABLET EVERY DAY 90 tablet 3  . ipratropium (ATROVENT) 0.06 % nasal spray     . meclizine (ANTIVERT) 25 MG tablet Take 1 tablet (25 mg total) by mouth 3 (three) times daily as needed for dizziness or nausea. 30 tablet 1  . metoprolol succinate (TOPROL-XL) 25 MG 24 hr tablet TAKE 1 TABLET (25 MG TOTAL) BY MOUTH DAILY. 90 tablet 0  . nystatin cream (MYCOSTATIN) Apply 1 application topically 2 (two) times daily as needed (for rash).    . Probiotic CAPS Take 1 capsule by mouth daily.    . propranolol (INDERAL) 10 MG tablet Take 1 tablet (10 mg total) by mouth 3 (three) times daily as needed. 30 tablet 6  . rivaroxaban (XARELTO) 20 MG TABS tablet Take 1 tablet (20 mg total) by mouth daily with supper. 90 tablet 3  . rosuvastatin (CRESTOR) 5 MG tablet Take 1 tablet (5 mg total) by mouth daily. (Patient taking differently: Take 5 mg by mouth daily. Every other day) 90 tablet 3  . valsartan-hydrochlorothiazide (DIOVAN-HCT) 80-12.5 MG tablet TAKE 1 TABLET EVERY DAY (Patient taking differently: TAKE 0.5 TABLET  EVERY DAY) 90 tablet 2  . doxycycline (VIBRA-TABS) 100 MG tablet Take 1 tablet (100 mg total) by mouth 2 (two) times daily. 14 tablet 0  . fluticasone (FLONASE) 50 MCG/ACT nasal spray Place 1 spray into both nostrils daily.    . predniSONE (DELTASONE) 20 MG tablet Take 2 tablets (40 mg total) by mouth daily. For 5 days, then 20mg  daily for 5 days 15 tablet 0  . diltiazem (CARDIZEM) 30 MG tablet Take 1 tablet (30 mg total) by mouth 3 (three) times daily as needed. 30 tablet 6   No facility-administered medications prior to visit.      Past Medical History:  Diagnosis Date  . Adenomatous colon polyp   .  Allergic rhinitis due to pollen   . Arrhythmia    A-Fib  . Atrial fibrillation ([place])   . Degenerative disc disease    cervical and lumbar  . GERD (gastroesophageal reflux disease)   . Hearing loss in right ear   . Hyperlipidemia   . Hypertension   . Osteoarthritis, multiple sites   . PMR (polymyalgia rheumatica) ([facility])   . Rheumatic fever   . Urge incontinence   . Vertigo       Review of Systems  Constitutional: Negative for chills and fever.  Respiratory: Negative for chest tightness and shortness of breath.   Cardiovascular: Negative for chest pain, palpitations and leg swelling.  Skin: Positive for rash.      Objective:    BP (!) 144/88 (BP Location: Left Arm, Patient Position: Sitting, Cuff Size: Large)   Pulse 74   Temp 97.4 F (36.3 C) (Oral)   Resp 16   Ht 5\' 6"  (1.676 m)   Wt 189 lb (85.7 kg)   SpO2 97%   BMI 30.51 kg/m  Nursing note and vital signs reviewed.  Physical Exam  Constitutional: She is oriented to person, place, and time. She appears well-developed and well-nourished. No distress.  Cardiovascular: Normal rate, regular rhythm, normal heart sounds and intact distal pulses.   Pulmonary/Chest: Effort normal and breath sounds normal.  Vesicular rash located in a dermatomal pattern underneath and lateral to right breast. Appears red in color and sporadic vesicles with one grouping.  Neurological: She is alert and oriented to person, place, and time.  Skin: Skin is warm and dry.  Psychiatric: She has a normal mood and affect. Her behavior is normal. Judgment and thought content normal.       Assessment & Plan:   Problem List Items Addressed This Visit      Other   Herpes zoster without complication - Primary    New onset vesicular rash in a dermatomal pattern consistent with herpes zoster. Start Valtrex. Continue over-the-counter medications as needed for symptom relief and supportive care. No indication for gabapentin at this time. Follow-up if  symptoms worsen or do not improve.      Relevant Medications   valACYclovir (VALTREX) 1000 MG tablet       I have discontinued [name]'s fluticasone, diltiazem, doxycycline, and predniSONE. I am also having her start on valACYclovir. Additionally, I am having her maintain her ARTIFICIAL TEAR OP, acetaminophen, rivaroxaban, ALPRAZolam, nystatin cream, Probiotic, meclizine, cholecalciferol, propranolol, ipratropium, cyanocobalamin, rosuvastatin, folic acid, valsartan-hydrochlorothiazide, metoprolol succinate, and levocetirizine.   Meds ordered this encounter  Medications  . levocetirizine (XYZAL) 5 MG tablet    Sig: Take 5 mg by mouth every evening.  . valACYclovir (VALTREX) 1000 MG tablet    Sig: Take 1 tablet (1,000 mg  total) by mouth 3 (three) times daily.    Dispense:  21 tablet    Refill:  0    Order Specific Question:   Supervising Provider    Answer:   Pricilla Holm A [[id number]]     Follow-up: Return if symptoms worsen or fail to improve.  Mauricio Po, FNP

## 2017-05-27 NOTE — Patient Instructions (Signed)
Thank you for choosing [facility].  SUMMARY AND INSTRUCTIONS:  Please start the Valtrex  Continue to monitor for signs of rash around eyes.  Follow up for any worsening.  Cool compresses to help with itching; May use lidocaine cream as needed.  Medication:  Your prescription(s) have been submitted to your pharmacy or been printed and provided for you. Please take as directed and contact our office if you believe you are having problem(s) with the medication(s) or have any questions.   Follow up:  If your symptoms worsen or fail to improve, please contact our office for further instruction, or in case of emergency go directly to the emergency room at the closest medical facility.    Shingles Shingles is an infection that causes a painful skin rash and fluid-filled blisters. Shingles is caused by the same virus that causes chickenpox. Shingles only develops in people who:  Have had chickenpox.  Have gotten the chickenpox vaccine. (This is rare.)  The first symptoms of shingles may be itching, tingling, or pain in an area on your skin. A rash will follow in a few days or weeks. The rash is usually on one side of the body in a bandlike or beltlike pattern. Over time, the rash turns into fluid-filled blisters that break open, scab over, and dry up. Medicines may:  Help you manage pain.  Help you recover more quickly.  Help to prevent long-term problems.  Follow these instructions at home: Medicines  Take medicines only as told by your doctor.  Apply an anti-itch or numbing cream to the affected area as told by your doctor. Blister and Rash Care  Take a cool bath or put cool compresses on the area of the rash or blisters as told by your doctor. This may help with pain and itching.  Keep your rash covered with a loose bandage (dressing). Wear loose-fitting clothing.  Keep your rash and blisters clean with mild soap and cool water or as told by your doctor.  Check  your rash every day for signs of infection. These include redness, swelling, and pain that lasts or gets worse.  Do not pick your blisters.  Do not scratch your rash. General instructions  Rest as told by your doctor.  Keep all follow-up visits as told by your doctor. This is important.  Until your blisters scab over, your infection can cause chickenpox in people who have never had it or been vaccinated against it. To prevent this from happening, avoid touching other people or being around other people, especially: ? Babies. ? Pregnant women. ? Children who have eczema. ? Elderly people who have transplants. ? People who have chronic illnesses, such as leukemia or AIDS. Contact a doctor if:  Your pain does not get better with medicine.  Your pain does not get better after the rash heals.  Your rash looks infected. Signs of infection include: ? Redness. ? Swelling. ? Pain that lasts or gets worse. Get help right away if:  The rash is on your face or nose.  You have pain in your face, pain around your eye area, or loss of feeling on one side of your face.  You have ear pain or you have ringing in your ear.  You have loss of taste.  Your condition gets worse. This information is not intended to replace advice given to you by your health care provider. Make sure you discuss any questions you have with your health care provider. Document Released: 05/26/2008 Document Revised:  08/03/2016 Document Reviewed: 09/19/2014 Elsevier Interactive Patient Education  [employer].

## 2017-05-27 NOTE — Assessment & Plan Note (Signed)
New onset vesicular rash in a dermatomal pattern consistent with herpes zoster. Start Valtrex. Continue over-the-counter medications as needed for symptom relief and supportive care. No indication for gabapentin at this time. Follow-up if symptoms worsen or do not improve.

## 2017-06-13 IMAGING — MR MR HEAD WO/W CM
9 of 11 series · 38 of 48 positions shown · IV contrast (multihance)
Comparison: MRI 07/02/2015

CLINICAL DATA: Sudden hearing loss right side

EXAM:
MRI HEAD WITHOUT AND WITH CONTRAST
TECHNIQUE: Multiplanar, multiecho pulse sequences of the brain and surrounding
structures were obtained without and with intravenous contrast.
CONTRAST:  16mL MULTIHANCE GADOBENATE DIMEGLUMINE 529 MG/ML IV SOLN

[Series 2: T1 · sagittal · 5.0mm · 0.45mm/px · 3 of 28 slices shown (1 of 4)]
[im 1/28]
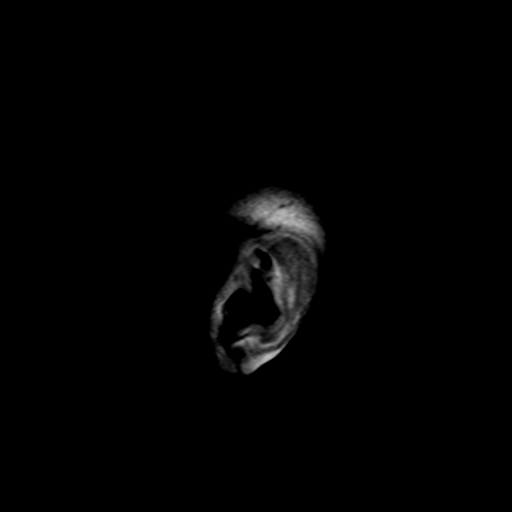
[im 14/28]
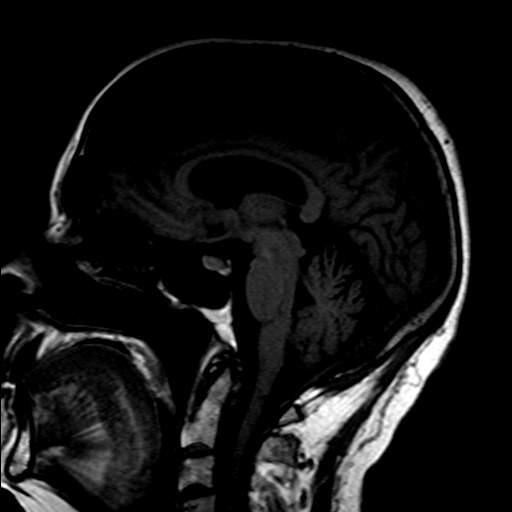
[im 28/28]
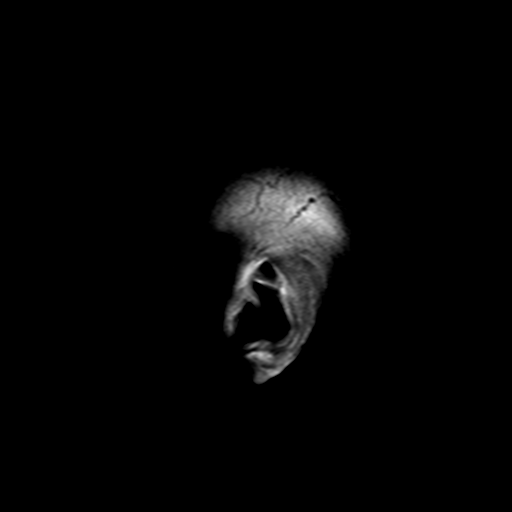

[Series 4: DWI · axial · 4.0mm · 0.94mm/px · z∈[-39,+128]mm · 7 of 44 slices shown (1 of 2)]
[im 1/44]
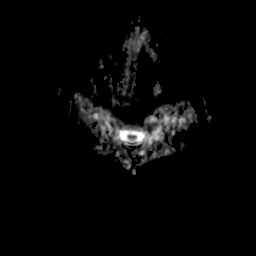
[im 8/44]
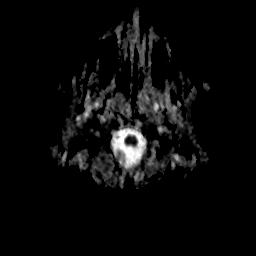
[im 15/44]
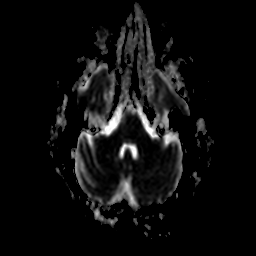
[im 22/44]
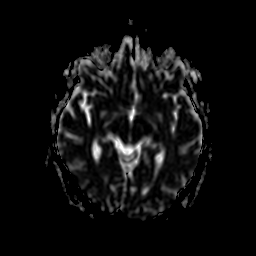
[im 29/44]
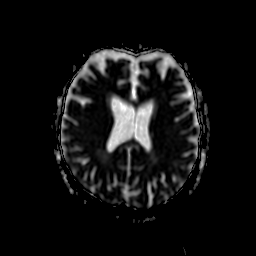
[im 36/44]
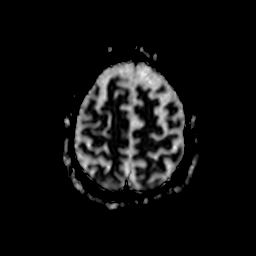
[im 44/44]
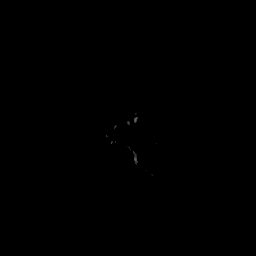

[Series 5: T2 · axial · 5.0mm · 0.45mm/px · z∈[-31,+127]mm · 4 of 26 slices shown]
[im 1/26]
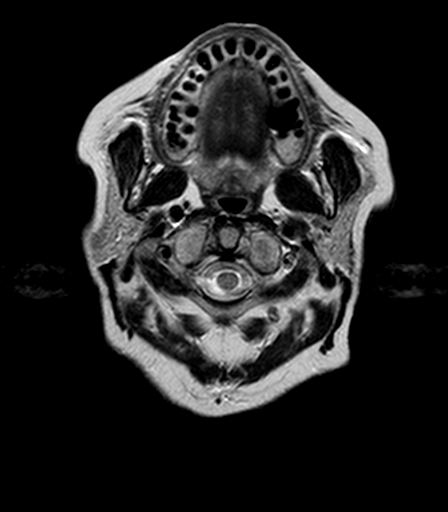
[im 9/26]
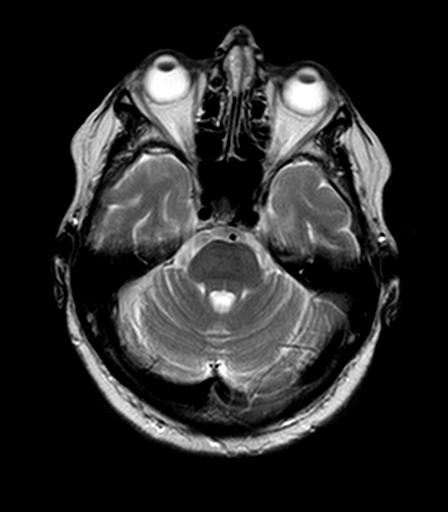
[im 17/26]
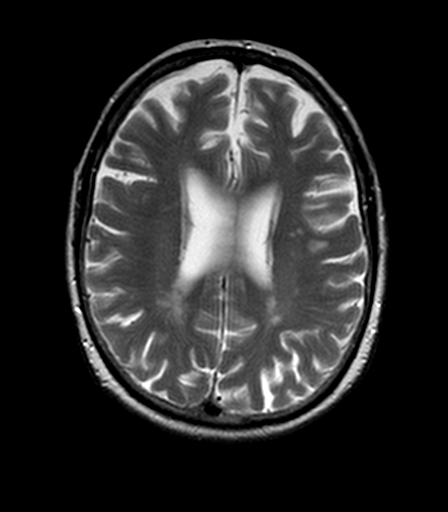
[im 26/26]
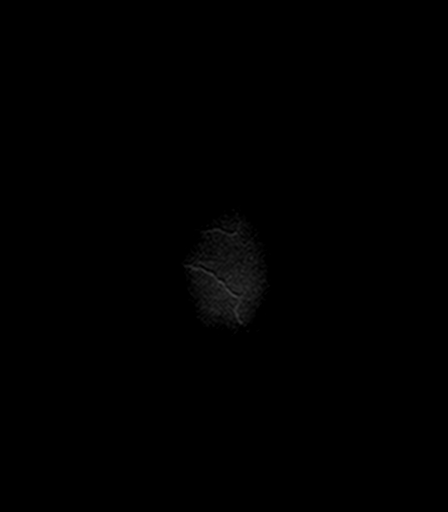

[Series 6: DWI · axial · 4.0mm · 0.94mm/px · z∈[-35,+121]mm · 6 of 39 slices shown (2 of 2)]
[im 1/39]
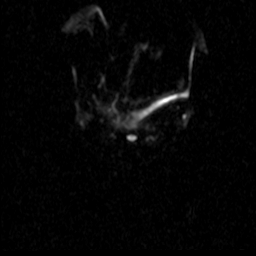
[im 8/39]
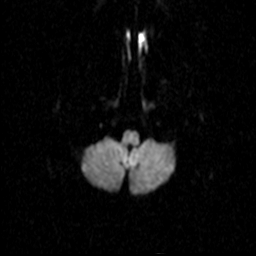
[im 16/39]
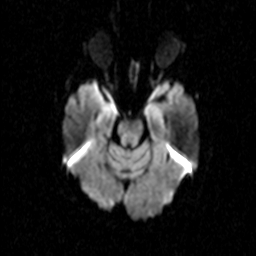
[im 23/39]
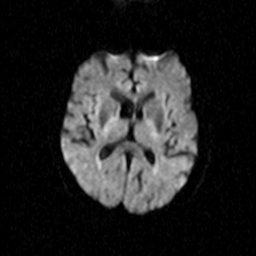
[im 31/39]
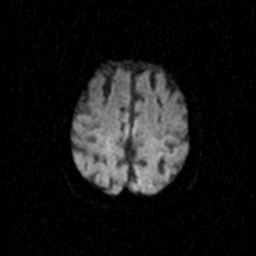
[im 39/39]
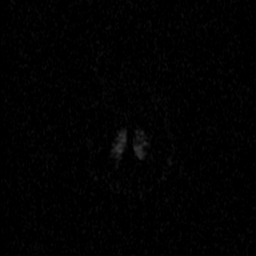

[Series 7: FLAIR · axial · 5.0mm · 0.90mm/px · z∈[-31,+127]mm · 4 of 26 slices shown]
[im 1/26]
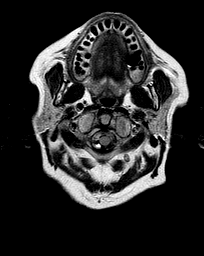
[im 9/26]
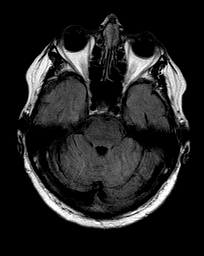
[im 17/26]
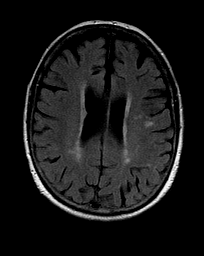
[im 26/26]
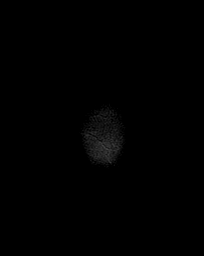

[Series 8: T1 · coronal · 3.0mm · 0.70mm/px · 2 of 11 slices shown (2 of 4)]
[im 1/11]
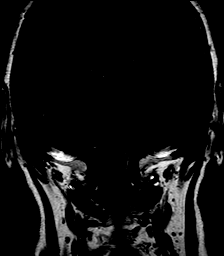
[im 11/11]
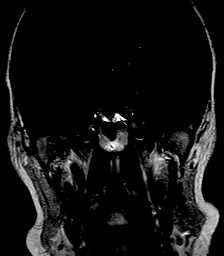

[Series 10: T1 · axial · 3.0mm · 0.70mm/px · z∈[-9,+20]mm · 2 of 11 slices shown (3 of 4)]
[im 1/11]
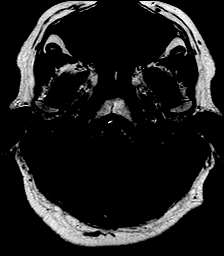
[im 11/11]
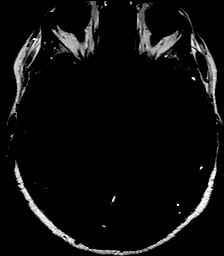

[Series 11: T1 · axial · 3.0mm · 0.70mm/px · z∈[-10,+19]mm · 2 of 11 slices shown (4 of 4)]
[im 1/11]
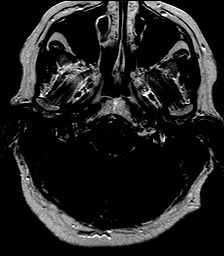
[im 11/11]
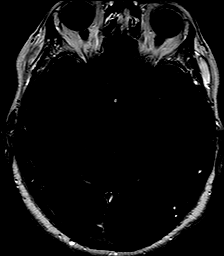

[Series 13: T1 post-contrast · axial · 3.0mm · 0.45mm/px · z∈[-30,+130]mm · 8 of 56 slices shown]
[im 1/56]
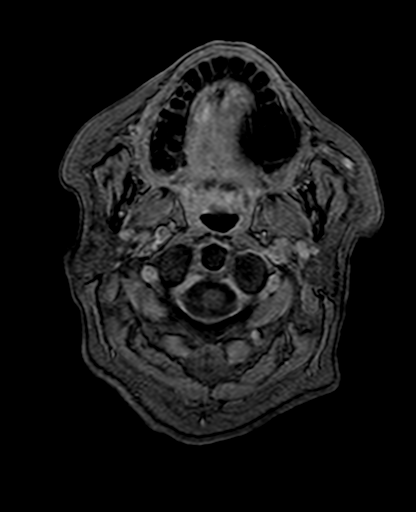
[im 8/56]
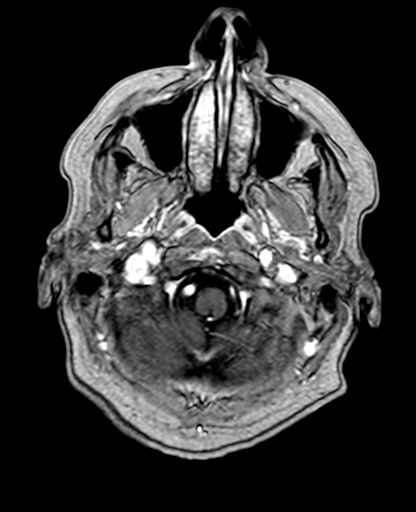
[im 16/56]
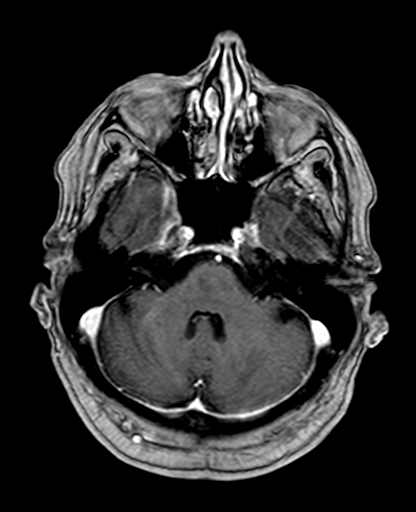
[im 24/56]
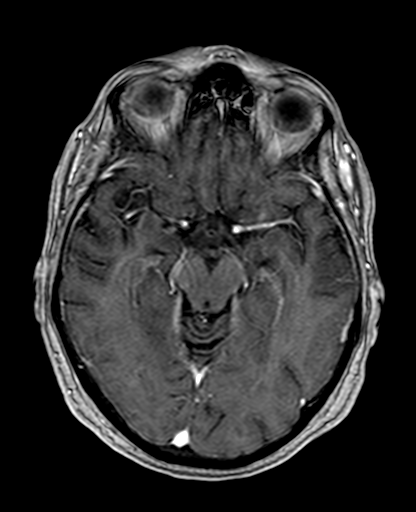
[im 32/56]
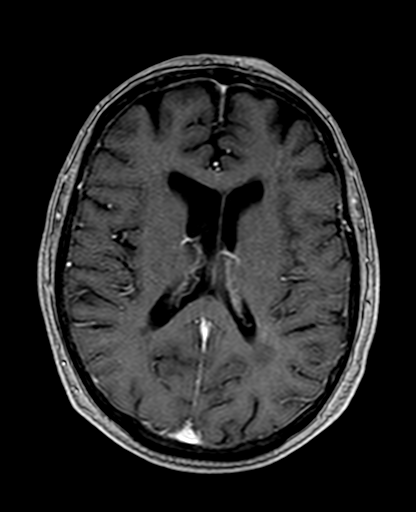
[im 40/56]
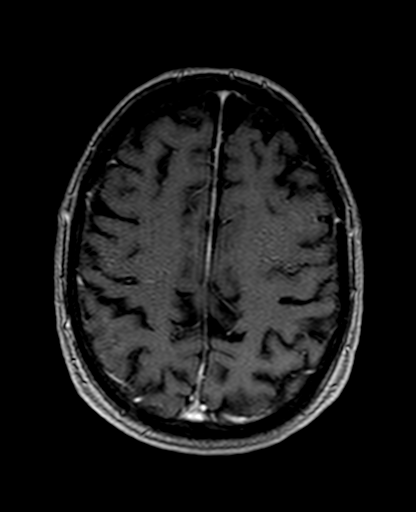
[im 48/56]
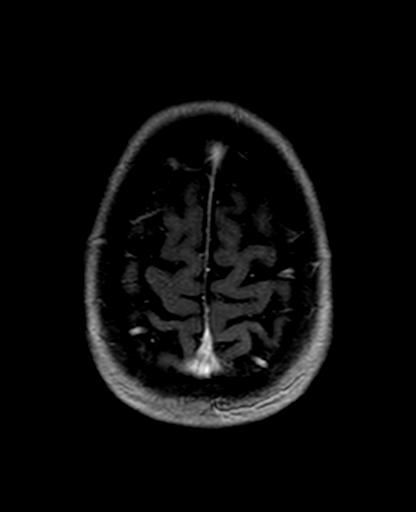
[im 56/56]
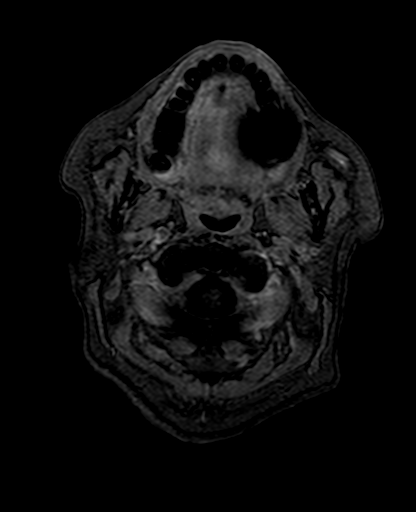

[38 of 48 positions shown; findings below may reference images not displayed]

FINDINGS: IAC protocol was performed including thin section imaging through
the posterior fossa before and after intravenous contrast.

Small area of enhancement in the fundus of the right internal
auditory canal is unchanged. This measures approximately 1 x 2 mm.
No definite mass lesion is seen within the right internal auditory
canal. Left internal auditory canal is normal.

Mastoid sinus clear bilaterally. Basilar cisterns are normal. Normal
brainstem.

Negative for hydrocephalus.  Cerebral volume normal for age.

Scattered white matter hyperintensities left greater than right are
stable and most consistent with chronic microvascular ischemia.

No acute infarct identified.

Normal enhancement of the remainder of the brain.

Paranasal sinuses clear.  Normal orbit.
IMPRESSION: 1 x 2 mm enhancement within the fundus of the right internal
auditory canal is unchanged and may represent a small vestibular
schwannoma. Continued follow-up recommended

Chronic microvascular ischemic change in the white matter. No acute
intracranial abnormality.

## 2017-07-11 ENCOUNTER — Ambulatory Visit
Admission: EM | Admit: 2017-07-11 | Discharge: 2017-07-11 | Disposition: A | Discharge status: Home / Self Care | Payer: Medicare Other | Attending: Emergency Medicine | Admitting: Emergency Medicine

## 2017-07-11 ENCOUNTER — Encounter: Payer: Self-pay | Admitting: Gynecology

## 2017-07-11 DIAGNOSIS — H209 Unspecified iridocyclitis: Secondary | ICD-10-CM | POA: Diagnosis not present

## 2017-07-11 MED ORDER — NEOMYCIN-POLYMYXIN-DEXAMETH 3.5-10000-0.1 OP SUSP: 2.0000 [drp] | Freq: Four times a day (QID) | OPHTHALMIC | 1 refills | Status: DC

## 2017-07-11 NOTE — Discharge Instructions (Signed)
start the Maxitrol 4 times a day. Dr. Wallace Going will see you tomorrow at his office at 1 PM at the [facility]. go to the right side of the building in the employee parking lot and meet him at the back door. Go immediately to the ER for the signs and symptoms we discussed

## 2017-07-11 NOTE — ED Provider Notes (Signed)
HPI  SUBJECTIVE:  Jennifer Schmitt is a 82 y.o. female who presents with right-sided achy periorbital pain with sharp occasional pinprick sensations starting last night, increased tearing right eye and bilateral nasal congestion starting this morning. She reports right eye redness, bilateral photophobia worse on the right than the left, right-sided frontal headache. Mild discharge this morning. She reports eye pain and foreign body sensation described as "grains of sand" in her eye. She reports mild pain with extraocular movements to the right. She denies itching, visual changes, nausea, vomiting, facial rash. No halos around lights, pain worse in the dark. No eyelid swelling. No sick contacts. No trauma to the eye, recent exposure to chemicals although she states that she sprayed her roses about 4 days ago, fumes. She tried Aeronautical engineer and artificial tears this morning and Zaditor helped very temporarily, symptoms are worse with light. She does not wear contacts. She wears glasses. No recent URI or viral syndromes. patient has an extensive medical history including seasonal allergies and zoster underneath her right bra line and along her right lower abdomen, also history of atrial fibrillation on Xarelto, dry, hypertension, cataracts. No history of glaucoma, diabetes, sickle cell disease. BZJ:IRCVEL, Theophilus Kinds, MD Ophthalmology: Dr. George Ina  Past Medical History:  Diagnosis Date  . Adenomatous colon polyp   . Allergic rhinitis due to pollen   . Arrhythmia    A-Fib  . Atrial fibrillation ([place])   . Degenerative disc disease    cervical and lumbar  . GERD (gastroesophageal reflux disease)   . Hearing loss in right ear   . Hyperlipidemia   . Hypertension   . Osteoarthritis, multiple sites   . PMR (polymyalgia rheumatica) ([facility])   . Rheumatic fever   . Urge incontinence   . Vertigo     Past Surgical History:  Procedure Laterality Date  . ABDOMINAL HYSTERECTOMY    . ANKLE FRACTURE SURGERY Left  1996  . BREAST BIOPSY Left 2010   negative  . DEXA  11/09/06   normal  . Meningitis  1963  . SPINE SURGERY    . SYNOVECTOMY Right 1973   elbow  . TONSILLECTOMY AND ADENOIDECTOMY      Family History  Problem Relation Age of Onset  . Schizophrenia Mother        paranoid  . Heart disease Mother        from psych meds  . Arthritis Mother   . Rheum arthritis Mother   . Cancer Father        prostate  . Arthritis Father   . Diabetes Father     Social History  Substance Use Topics  . Smoking status: Never Smoker  . Smokeless tobacco: Never Used  . Alcohol use Yes    No current facility-administered medications for this encounter.   Current Outpatient Prescriptions:  .  acetaminophen (TYLENOL) 500 MG tablet, Take 1,000 mg by mouth every 4 (four) hours as needed for mild pain. , Disp: , Rfl:  .  ALPRAZolam (XANAX) 0.25 MG tablet, Take 1 tablet (0.25 mg total) by mouth daily as needed for anxiety or sleep., Disp: 30 tablet, Rfl: 0 .  ARTIFICIAL TEAR OP, Apply 1 drop to eye 2 (two) times daily as needed (for dry eyes). , Disp: , Rfl:  .  cholecalciferol (VITAMIN D) 1000 units tablet, Take 1,000 Units by mouth daily., Disp: , Rfl:  .  cyanocobalamin (,VITAMIN B-12,) 1000 MCG/ML injection, Inject 1 mL (1,000 mcg total) into the muscle every 30 (thirty)  days. Pt gets an injection every 30 days, Disp: 1 mL, Rfl: 12 .  folic acid (FOLVITE) 1 MG tablet, TAKE 1 TABLET EVERY DAY, Disp: 90 tablet, Rfl: 3 .  ipratropium (ATROVENT) 0.06 % nasal spray, , Disp: , Rfl:  .  levocetirizine (XYZAL) 5 MG tablet, Take 5 mg by mouth every evening., Disp: , Rfl:  .  meclizine (ANTIVERT) 25 MG tablet, Take 1 tablet (25 mg total) by mouth 3 (three) times daily as needed for dizziness or nausea., Disp: 30 tablet, Rfl: 1 .  metoprolol succinate (TOPROL-XL) 25 MG 24 hr tablet, TAKE 1 TABLET (25 MG TOTAL) BY MOUTH DAILY., Disp: 90 tablet, Rfl: 0 .  nystatin cream (MYCOSTATIN), Apply 1 application topically 2  (two) times daily as needed (for rash)., Disp: , Rfl:  .  Probiotic CAPS, Take 1 capsule by mouth daily., Disp: , Rfl:  .  propranolol (INDERAL) 10 MG tablet, Take 1 tablet (10 mg total) by mouth 3 (three) times daily as needed., Disp: 30 tablet, Rfl: 6 .  rosuvastatin (CRESTOR) 5 MG tablet, Take 1 tablet (5 mg total) by mouth daily. (Patient taking differently: Take 5 mg by mouth daily. Every other day), Disp: 90 tablet, Rfl: 3 .  valACYclovir (VALTREX) 1000 MG tablet, Take 1 tablet (1,000 mg total) by mouth 3 (three) times daily., Disp: 21 tablet, Rfl: 0 .  valsartan-hydrochlorothiazide (DIOVAN-HCT) 80-12.5 MG tablet, TAKE 1 TABLET EVERY DAY (Patient taking differently: TAKE 0.5 TABLET EVERY DAY), Disp: 90 tablet, Rfl: 2 .  neomycin-polymyxin b-dexamethasone (MAXITROL) 3.5-10000-0.1 SUSP, Place 2 drops into the right eye every 6 (six) hours., Disp: 5 mL, Rfl: 1 .  rivaroxaban (XARELTO) 20 MG TABS tablet, Take 1 tablet (20 mg total) by mouth daily with supper., Disp: 90 tablet, Rfl: 3  Allergies  Allergen Reactions  . Actonel [Risedronate Sodium] Nausea And Vomiting  . Boniva [Ibandronic Acid] Nausea And Vomiting  . Diclofenac Other (See Comments)    Voltaren Gel aggravated her sinus passages  . Fosamax [Alendronate Sodium] Nausea And Vomiting  . Lipitor [Atorvastatin] Other (See Comments)    Reaction:  Muscle aches   . Miacalcin [Calcitonin ([place])] Other (See Comments)    Reaction:  Sneezing   . Talwin [Pentazocine] Other (See Comments)    Reaction:  Hallucinations   . Latex Rash  . Niaspan [Niacin Er] Swelling and Rash     ROS  As noted in HPI.   Physical Exam  BP (!) 143/58 (BP Location: Left Arm)   Pulse 61   Temp 98.5 F (36.9 C) (Oral)   Resp 16   Ht 5\' 7"  (1.702 m)   Wt 185 lb (83.9 kg)   SpO2 99%   BMI 28.98 kg/m   Constitutional: Well developed, well nourished, no acute distress Eyes:  EOMI, PERRLA .  right conjunctival injection. No subconjunctival  hemorrhage. Positive direct and consensual photophobia. No foreign body seen on lid eversion. No periorbital erythema, tenderness, swelling. No facial rash. No hyphema, abrasion, dendrite corneal ulcer seen on fluoroscein exam   Visual Acuity  Right Eye Distance: 20/30 (with corrective lens) Left Eye Distance: 20/40 (with corrective lens) Bilateral Distance: 20/30 (with corrective lens)  Right Eye Near:   Left Eye Near:    Bilateral Near:    HENT: Normocephalic, atraumatic, mucus membranes moist Respiratory: Normal inspiratory effort Cardiovascular: Normal rate GI: nondistended skin: No facial rash, skin intact Musculoskeletal: no deformities Neurologic: Alert & oriented x 3, no focal neuro deficits Psychiatric: Speech and  behavior appropriate   ED Course   Medications - No data to display  No orders of the defined types were placed in this encounter.   No results found for this or any previous visit (from the past 24 hour(s)). No results found.  ED Clinical Impression  Iritis   ED Assessment/Plan  Right eye visual acuity better than left eye. Does not appear to be conjunctivitis, hyphema, foreign body, corneal ulcer or abrasion. Does not appear to be periorbital post-septal cellulitis, glaucoma. Shingles in the differential but there is no facial rash at this point in time. 0941- paged Dr. Wallace Going, optho on call [phone]- discussed case with Dr. Wallace Going. Will start her on Maxitrol which is neomycin, polymyxin, dexamethasone drops 4 times a day. He will see her tomorrow at his office at 1 PM at the [facility]. She is to go to the right side of the building and meet him at the back door.  Discussed MDM, plan and followup with patient. Discussed sn/sx that should prompt return to the ED. Patient agrees with plan.   Meds ordered this encounter  Medications  . neomycin-polymyxin b-dexamethasone (MAXITROL) 3.5-10000-0.1 SUSP    Sig:  Place 2 drops into the right eye every 6 (six) hours.    Dispense:  5 mL    Refill:  1    *This clinic note was created using [employer]. Therefore, there may be occasional mistakes despite careful proofreading.  ?   Melynda Ripple, MD 07/11/17 1213

## 2017-07-11 NOTE — ED Triage Notes (Signed)
Per patient right eye pain / feels like gravels and drainage x yesterday.

## 2017-07-13 ENCOUNTER — Telehealth: Payer: Self-pay | Admitting: *Deleted

## 2017-07-13 NOTE — Telephone Encounter (Signed)
No appt at this point. I called to check on the pt. She said she went to [facility] in [place]. They called [facility] and was given an eye drop and she saw her ophthalmologist yesterday and had cornea inflammation.

## 2017-07-13 NOTE — Telephone Encounter (Signed)
Did she make an appt?

## 2017-07-13 NOTE — Telephone Encounter (Signed)
  PATIENT WENT TO ED   [place] Night - Client [place] [facility] Patient Name: Jennifer Schmitt Gender: Female DOB: [birth year] Age: 82 Y 10 M 5 D Return Phone Number: [phone] (Primary), [phone] (Secondary) City/State/Zip: [place] [address] Client [place] Night - Client Client Site [place] - Night Physician [name] - MD Who Is Calling Patient / Member / Family / Caregiver Call Type Triage / Clinical Relationship To Patient Self Return Phone Number [phone] (Primary) Chief Complaint Eye Pain Reason for Call Symptomatic / Request for [name] states her eye hurts. Nurse Assessment Nurse: Larita Fife Date/Time Eilene Ghazi Time): 07/11/2017 6:33:03 AM Confirm and document reason for call. If symptomatic, describe symptoms. ---Caller states that both eyes are swollen and states that her right eye is more painful than the left. States that she is tearing and nose is trying to run. States that she has "grainy feeling in her right eye" and states that her eyes are drooping". Does the PT have any chronic conditions? (i.e. diabetes, asthma, etc.) ---Yes List chronic conditions. ---HTN, A-Fib, Xarelto, arthritis Guidelines Guideline Title Affirmed Question Eye - Pus or Discharge MODERATE eye pain (e.g., interferes with normal activities) Disp. Time Eilene Ghazi Time) Disposition Final User 07/11/2017 6:46:54 AM See Physician within 4 Hours (or PCP triage) Yes Larita Fife Referrals [facility] Care Advice Given Per Guideline SEE PHYSICIAN WITHIN 4 HOURS (or PCP triage): * IF OFFICE WILL BE OPEN: You need to be seen within the next 3 or 4 hours. Call your doctor's office now or as soon as it opens. PAIN  MEDICINES: ACETAMINOPHEN (E.G., TYLENOL): * Take 650 mg (two 325 mg pills) by mouth every 4-6 hours as needed. Each Regular Strength Tylenol pill has 325 mg of acetaminophen. The most you should take each day is 3,250 mg (10 Regular Strength pills a day). * Another choice is to take 1,000 mg (two 500 mg pills) every 8 hours as needed. Each Extra Strength Tylenol pill has 500 mg of acetaminophen. The most you should take each day is 3,000 mg (6 Extra Strength pills a day). CALL BACK IF: * You become worse. CARE ADVICE given per Eye - Pus or Discharge (Adult) guideline. PLEASE NOTE: All timestamps contained within this report are represented as [place] Standard Time. CONFIDENTIALTY NOTICE: This fax transmission is intended only for the addressee. It contains information that is legally privileged, confidential or otherwise protected from use or disclosure. If you are not the intended recipient, you are strictly prohibited from reviewing, disclosing, copying using or disseminating any of this information or taking any action in reliance on or regarding this information. If you have received this fax in error, please notify us immediately by telephone so that we can arrange for its return to [place]. Phone: [phone], Toll-Free: [phone], Fax: [phone] Page: 2 of 2 Call Id:

## 2017-07-13 NOTE — Telephone Encounter (Signed)
noted 

## 2017-08-14 ENCOUNTER — Other Ambulatory Visit: Payer: Self-pay | Admitting: Cardiovascular Disease

## 2017-10-01 ENCOUNTER — Other Ambulatory Visit: Payer: Self-pay

## 2017-10-01 MED ORDER — CYANOCOBALAMIN 1000 MCG/ML IJ SOLN: 1000.0000 ug | INTRAMUSCULAR | 3 refills | Status: DC

## 2017-10-12 ENCOUNTER — Ambulatory Visit: Payer: Self-pay | Admitting: *Deleted

## 2017-10-12 NOTE — Telephone Encounter (Signed)
Pt states she was scheduled for a complete physical on 10/21/17 back in [place] 2018 and paperwork received by mail states that she is going to have a medicare wellness visit. Pt upset and want clarification of the appt type so she can know is she needs to fast for lab work. Explained to the pt that she was scheduled for a Medicare Wellness visit and pt states that is not what she was told originally and wants to make sure she had a complete physical done on 10/21/17 because she had not had one done since 2017. Explained to the pt that the office will be notified so that she can receive clarification of the type of appt to be prepared for on 10/21/17.

## 2017-10-15 ENCOUNTER — Ambulatory Visit: Payer: Medicare Other | Admitting: Cardiovascular Disease

## 2017-10-21 ENCOUNTER — Encounter: Payer: Self-pay | Admitting: Internal Medicine

## 2017-10-21 ENCOUNTER — Ambulatory Visit (INDEPENDENT_AMBULATORY_CARE_PROVIDER_SITE_OTHER): Payer: Medicare Other | Admitting: Internal Medicine

## 2017-10-21 VITALS — BP 116/70 | HR 64 | Temp 98.6°F | Ht 66.0 in | Wt 190.0 lb

## 2017-10-21 DIAGNOSIS — Z23 Encounter for immunization: Secondary | ICD-10-CM | POA: Diagnosis not present

## 2017-10-21 DIAGNOSIS — F39 Unspecified mood [affective] disorder: Secondary | ICD-10-CM

## 2017-10-21 DIAGNOSIS — Z7189 Other specified counseling: Secondary | ICD-10-CM | POA: Diagnosis not present

## 2017-10-21 DIAGNOSIS — M353 Polymyalgia rheumatica: Secondary | ICD-10-CM

## 2017-10-21 DIAGNOSIS — D510 Vitamin B12 deficiency anemia due to intrinsic factor deficiency: Secondary | ICD-10-CM | POA: Diagnosis not present

## 2017-10-21 DIAGNOSIS — I480 Paroxysmal atrial fibrillation: Secondary | ICD-10-CM

## 2017-10-21 DIAGNOSIS — Z0000 Encounter for general adult medical examination without abnormal findings: Secondary | ICD-10-CM

## 2017-10-21 DIAGNOSIS — I10 Essential (primary) hypertension: Secondary | ICD-10-CM

## 2017-10-21 LAB — COMPREHENSIVE METABOLIC PANEL
ALK PHOS: 57 U/L (ref 39–117)
ALT: 13 U/L (ref 0–35)
AST: 16 U/L (ref 0–37)
Albumin: 4 g/dL (ref 3.5–5.2)
BILIRUBIN TOTAL: 0.4 mg/dL (ref 0.2–1.2)
BUN: 19 mg/dL (ref 6–23)
CO2: 31 mEq/L (ref 19–32)
CREATININE: 0.75 mg/dL (ref 0.40–1.20)
Calcium: 9.3 mg/dL (ref 8.4–10.5)
Chloride: 103 mEq/L (ref 96–112)
GFR: 79 mL/min (ref >60.00)
GLUCOSE: 98 mg/dL (ref 70–99)
POTASSIUM: 4.7 meq/L (ref 3.5–5.1)
SODIUM: 140 meq/L (ref 135–145)
TOTAL PROTEIN: 7.3 g/dL (ref 6.0–8.3)

## 2017-10-21 LAB — CBC
HEMATOCRIT: 37.7 % (ref 36.0–46.0)
Hemoglobin: 12.6 g/dL (ref 12.0–15.0)
MCHC: 33.5 g/dL (ref 30.0–36.0)
MCV: 97.7 fl (ref 78.0–100.0)
Platelets: 248 10*3/uL (ref 150.0–400.0)
RBC: 3.86 Mil/uL — ABNORMAL LOW (ref 3.87–5.11)
RDW: 14 % (ref 11.5–15.5)
WBC: 4.5 10*3/uL (ref 4.0–10.5)

## 2017-10-21 LAB — LIPID PANEL
CHOL/HDL RATIO: 3
Cholesterol: 165 mg/dL (ref 0–200)
HDL: 63 mg/dL (ref >39.00)
LDL Cholesterol: 81 mg/dL (ref 0–99)
NONHDL: 102.07
Triglycerides: 106 mg/dL (ref 0.0–149.0)
VLDL: 21.2 mg/dL (ref 0.0–40.0)

## 2017-10-21 LAB — VITAMIN B12: Vitamin B-12: 1145 pg/mL — ABNORMAL HIGH (ref 211–911)

## 2017-10-21 LAB — SEDIMENTATION RATE: Sed Rate: 5 mm/hr (ref 0–30)

## 2017-10-21 LAB — T4, FREE: Free T4: 0.72 ng/dL (ref 0.60–1.60)

## 2017-10-21 MED ORDER — ALPRAZOLAM 0.25 MG PO TABS: 0.2500 mg | ORAL_TABLET | Freq: Every day | ORAL | 0 refills | Status: DC | PRN

## 2017-10-21 MED ORDER — NYSTATIN 100000 UNIT/GM EX CREA: 1.0000 "application " | TOPICAL_CREAM | Freq: Two times a day (BID) | CUTANEOUS | 5 refills | Status: DC | PRN

## 2017-10-21 NOTE — Progress Notes (Signed)
Subjective:    Patient ID: [name], female    DOB: 02-07-1935, 82 y.o.   MRN: [id number]  HPI Here for Medicare wellness visit and follow up of chronic health conditions Reviewed form and advanced directives Reviewed other doctors Only occasional drink of alcohol No tobacco Exercises regularly No falls Vision is okay----holding off on cataract surgery for now Hearing aides---not much help (right ear is very bad) Independent with instrumental ADLs  She has a few concerns Reviewed MOST form and executed this  Having some trouble with her sleep Using melatonin but not clearly helpful No significant daytime somnolence  Ongoing cyclical sensory changes --usually within a week of needing her B12 shot. Gets them at [facility] Discussed trying them every 3 weeks for now Last shot 2 weeks ago Aggravating at times--tingling fingers, chest/back symptoms  Has alprazolam--rarely uses it For mild anxiety and mind racing No depression or anhedonia  Does feel her heart irregular for 1-2 times per month Doesn't think this is the atrial fib though (maybe just PVC's) No chest pain--just the burning sensation in chest No SOB No dizziness or syncope Occasional ankle discoloration--- ?slight swelling at times (related to past ankle surgery)  No severe aching No trigger point tenderness now either like she had with PMR No significant headaches or vision loss Some shoulder stiffness in AM---loosens up quickly  Current Outpatient Prescriptions on File Prior to Visit  Medication Sig Dispense Refill  . acetaminophen (TYLENOL) 500 MG tablet Take 1,000 mg by mouth every 4 (four) hours as needed for mild pain.     Marland Kitchen ALPRAZolam (XANAX) 0.25 MG tablet Take 1 tablet (0.25 mg total) by mouth daily as needed for anxiety or sleep. 30 tablet 0  . ARTIFICIAL TEAR OP Apply 1 drop to eye 2 (two) times daily as needed (for dry eyes).     . cholecalciferol (VITAMIN D) 1000 units tablet Take 1,000  Units by mouth daily.    . cyanocobalamin (,VITAMIN B-12,) 1000 MCG/ML injection Inject 1 mL (1,000 mcg total) into the muscle every 30 (thirty) days. Pt gets an injection every 30 days 3 mL 3  . folic acid (FOLVITE) 1 MG tablet TAKE 1 TABLET EVERY DAY 90 tablet 3  . ipratropium (ATROVENT) 0.06 % nasal spray     . levocetirizine (XYZAL) 5 MG tablet Take 5 mg by mouth every evening.    . meclizine (ANTIVERT) 25 MG tablet Take 1 tablet (25 mg total) by mouth 3 (three) times daily as needed for dizziness or nausea. 30 tablet 1  . metoprolol succinate (TOPROL-XL) 25 MG 24 hr tablet TAKE 1 TABLET (25 MG TOTAL) BY MOUTH DAILY. 90 tablet 0  . nystatin cream (MYCOSTATIN) Apply 1 application topically 2 (two) times daily as needed (for rash).    . Probiotic CAPS Take 1 capsule by mouth daily.    . propranolol (INDERAL) 10 MG tablet Take 1 tablet (10 mg total) by mouth 3 (three) times daily as needed. 30 tablet 6  . rivaroxaban (XARELTO) 20 MG TABS tablet Take 1 tablet (20 mg total) by mouth daily with supper. 90 tablet 3  . valACYclovir (VALTREX) 1000 MG tablet Take 1 tablet (1,000 mg total) by mouth 3 (three) times daily. 21 tablet 0  . valsartan-hydrochlorothiazide (DIOVAN-HCT) 80-12.5 MG tablet TAKE 1 TABLET EVERY DAY (Patient taking differently: TAKE 0.5 TABLET EVERY DAY) 90 tablet 2  . rosuvastatin (CRESTOR) 5 MG tablet Take 1 tablet (5 mg total) by mouth daily. (Patient  taking differently: Take 5 mg by mouth daily. Every other day) 90 tablet 3   No current facility-administered medications on file prior to visit.     Allergies  Allergen Reactions  . Actonel [Risedronate Sodium] Nausea And Vomiting  . Boniva [Ibandronic Acid] Nausea And Vomiting  . Diclofenac Other (See Comments)    Voltaren Gel aggravated her sinus passages  . Fosamax [Alendronate Sodium] Nausea And Vomiting  . Lipitor [Atorvastatin] Other (See Comments)    Reaction:  Muscle aches   . Miacalcin [Calcitonin ([place])] Other  (See Comments)    Reaction:  Sneezing   . Talwin [Pentazocine] Other (See Comments)    Reaction:  Hallucinations   . Latex Rash  . Niaspan [Niacin Er] Swelling and Rash    Past Medical History:  Diagnosis Date  . Adenomatous colon polyp   . Allergic rhinitis due to pollen   . Arrhythmia    A-Fib  . Atrial fibrillation ([place])   . Degenerative disc disease    cervical and lumbar  . GERD (gastroesophageal reflux disease)   . Hearing loss in right ear   . Hyperlipidemia   . Hypertension   . Osteoarthritis, multiple sites   . PMR (polymyalgia rheumatica) ([facility])   . Rheumatic fever   . Urge incontinence   . Vertigo     Past Surgical History:  Procedure Laterality Date  . ABDOMINAL HYSTERECTOMY    . ANKLE FRACTURE SURGERY Left 1996  . BREAST BIOPSY Left 2010   negative  . DEXA  11/09/06   normal  . Meningitis  1963  . SPINE SURGERY    . SYNOVECTOMY Right 1973   elbow  . TONSILLECTOMY AND ADENOIDECTOMY      Family History  Problem Relation Age of Onset  . Schizophrenia Mother        paranoid  . Heart disease Mother        from psych meds  . Arthritis Mother   . Rheum arthritis Mother   . Cancer Father        prostate  . Arthritis Father   . Diabetes Father     Social History   Social History  . Marital status: Married    Spouse name: N/A  . Number of children: 1  . Years of education: N/A   Occupational History  . 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 Smoking status: Never Smoker  . Smokeless tobacco: Never Used  . Alcohol use Yes  . Drug use: No  . Sexual activity: Not on file   Other Topics Concern  . Not on file   Social History Narrative   Retired 2007    1 son in [place] area   Spends part time in [place]      Has living will   Husband then son is health care POA   Not sure about DNR---will keep open resuscitation for now    No tube feedings if cognitively  unaware                  Review of Systems Some reflux at times. No dysphagia Appetite is good. Stress eater Weight up slightly Ongoing urinary incontinence--mostly at night (not a big deal in daytime). Nocturia x 4-5 Wears seat belt Teeth okay--- regular with dentist Chronic skin itching and rash---sees derm Some right knee pain--has to be careful. Some left hip pain if lies on it. Chronic back  pain--more upper now Some vaginal irritation. Plans to see gyn    Objective:   Physical Exam  Constitutional: She is oriented to person, place, and time. She appears well-developed. No distress.  HENT:  Mouth/Throat: Oropharynx is clear and moist. No oropharyngeal exudate.  Neck: No thyromegaly present.  Cardiovascular: Normal rate, regular rhythm, normal heart sounds and intact distal pulses.  Exam reveals no gallop.   No murmur heard. Pulmonary/Chest: Effort normal and breath sounds normal. No respiratory distress. She has no wheezes. She has no rales.  Abdominal: Soft. She exhibits no distension. There is no tenderness. There is no rebound and no guarding.  Musculoskeletal: She exhibits no edema or tenderness.  Lymphadenopathy:    She has no cervical adenopathy.  Neurological: She is alert and oriented to person, place, and time.  President--- "Daisy Floro, Barack Obama, Bush" [phone] [id number] Recall 3/3  Skin: No rash noted. No erythema.  Psychiatric: She has a normal mood and affect. Her behavior is normal.          Assessment & Plan:

## 2017-10-21 NOTE — Progress Notes (Signed)
Hearing Screening Comments: Wears hearing aids Vision Screening Comments: August 2018

## 2017-10-21 NOTE — Assessment & Plan Note (Signed)
May need the B12 every 3 weeks Likely the cause of the neuropathy

## 2017-10-21 NOTE — Assessment & Plan Note (Signed)
Some achiness but doesn't seem to be exacerbation Will check ESR

## 2017-10-21 NOTE — Addendum Note (Signed)
Addended by: Pilar Grammes on: 10/21/2017 11:42 AM   Modules accepted: Orders

## 2017-10-21 NOTE — Assessment & Plan Note (Signed)
MOST form done See social history

## 2017-10-21 NOTE — Assessment & Plan Note (Signed)
BP Readings from Last 3 Encounters:  10/21/17 116/70  07/11/17 (!) 143/58  05/27/17 (!) 144/88   Good control now

## 2017-10-21 NOTE — Assessment & Plan Note (Signed)
Still regular On xarelto Keeps up with Dr Rockey Situ

## 2017-10-21 NOTE — Assessment & Plan Note (Signed)
Rare anxiety spells Has the alprazolam for prn use

## 2017-10-21 NOTE — Patient Instructions (Signed)
DASH Eating Plan DASH stands for "Dietary Approaches to Stop Hypertension." The DASH eating plan is a healthy eating plan that has been shown to reduce high blood pressure (hypertension). It may also reduce your risk for type 2 diabetes, heart disease, and stroke. The DASH eating plan may also help with weight loss. What are tips for following this plan? General guidelines  Avoid eating more than 2,300 mg (milligrams) of salt (sodium) a day. If you have hypertension, you may need to reduce your sodium intake to 1,500 mg a day.  Limit alcohol intake to no more than 1 drink a day for nonpregnant women and 2 drinks a day for men. One drink equals 12 oz of beer, 5 oz of wine, or 1 oz of hard liquor.  Work with your health care provider to maintain a healthy body weight or to lose weight. Ask what an ideal weight is for you.  Get at least 30 minutes of exercise that causes your heart to beat faster (aerobic exercise) most days of the week. Activities may include walking, swimming, or biking.  Work with your health care provider or diet and nutrition specialist (dietitian) to adjust your eating plan to your individual calorie needs. Reading food labels  Check food labels for the amount of sodium per serving. Choose foods with less than 5 percent of the Daily Value of sodium. Generally, foods with less than 300 mg of sodium per serving fit into this eating plan.  To find whole grains, look for the word "whole" as the first word in the ingredient list. Shopping  Buy products labeled as "low-sodium" or "no salt added."  Buy fresh foods. Avoid canned foods and premade or frozen meals. Cooking  Avoid adding salt when cooking. Use salt-free seasonings or herbs instead of table salt or sea salt. Check with your health care provider or pharmacist before using salt substitutes.  Do not fry foods. Cook foods using healthy methods such as baking, boiling, grilling, and broiling instead.  Cook with  heart-healthy oils, such as olive, canola, soybean, or sunflower oil. Meal planning   Eat a balanced diet that includes: ? 5 or more servings of fruits and vegetables each day. At each meal, try to fill half of your plate with fruits and vegetables. ? Up to 6-8 servings of whole grains each day. ? Less than 6 oz of lean meat, poultry, or fish each day. A 3-oz serving of meat is about the same size as a deck of cards. One egg equals 1 oz. ? 2 servings of low-fat dairy each day. ? A serving of nuts, seeds, or beans 5 times each week. ? Heart-healthy fats. Healthy fats called Omega-3 fatty acids are found in foods such as flaxseeds and coldwater fish, like sardines, salmon, and mackerel.  Limit how much you eat of the following: ? Canned or prepackaged foods. ? Food that is high in trans fat, such as fried foods. ? Food that is high in saturated fat, such as fatty meat. ? Sweets, desserts, sugary drinks, and other foods with added sugar. ? Full-fat dairy products.  Do not salt foods before eating.  Try to eat at least 2 vegetarian meals each week.  Eat more home-cooked food and less restaurant, buffet, and fast food.  When eating at a restaurant, ask that your food be prepared with less salt or no salt, if possible. What foods are recommended? The items listed may not be a complete list. Talk with your dietitian about what   dietary choices are best for you. Grains Whole-grain or whole-wheat bread. Whole-grain or whole-wheat pasta. Brown rice. Oatmeal. Quinoa. Bulgur. Whole-grain and low-sodium cereals. Pita bread. Low-fat, low-sodium crackers. Whole-wheat flour tortillas. Vegetables Fresh or frozen vegetables (raw, steamed, roasted, or grilled). Low-sodium or reduced-sodium tomato and vegetable juice. Low-sodium or reduced-sodium tomato sauce and tomato paste. Low-sodium or reduced-sodium canned vegetables. Fruits All fresh, dried, or frozen fruit. Canned fruit in natural juice (without  added sugar). Meat and other protein foods Skinless chicken or turkey. Ground chicken or turkey. Pork with fat trimmed off. Fish and seafood. Egg whites. Dried beans, peas, or lentils. Unsalted nuts, nut butters, and seeds. Unsalted canned beans. Lean cuts of beef with fat trimmed off. Low-sodium, lean deli meat. Dairy Low-fat (1%) or fat-free (skim) milk. Fat-free, low-fat, or reduced-fat cheeses. Nonfat, low-sodium ricotta or cottage cheese. Low-fat or nonfat yogurt. Low-fat, low-sodium cheese. Fats and oils Soft margarine without trans fats. Vegetable oil. Low-fat, reduced-fat, or light mayonnaise and salad dressings (reduced-sodium). Canola, safflower, olive, soybean, and sunflower oils. Avocado. Seasoning and other foods Herbs. Spices. Seasoning mixes without salt. Unsalted popcorn and pretzels. Fat-free sweets. What foods are not recommended? The items listed may not be a complete list. Talk with your dietitian about what dietary choices are best for you. Grains Baked goods made with fat, such as croissants, muffins, or some breads. Dry pasta or rice meal packs. Vegetables Creamed or fried vegetables. Vegetables in a cheese sauce. Regular canned vegetables (not low-sodium or reduced-sodium). Regular canned tomato sauce and paste (not low-sodium or reduced-sodium). Regular tomato and vegetable juice (not low-sodium or reduced-sodium). Pickles. Olives. Fruits Canned fruit in a light or heavy syrup. Fried fruit. Fruit in cream or butter sauce. Meat and other protein foods Fatty cuts of meat. Ribs. Fried meat. Bacon. Sausage. Bologna and other processed lunch meats. Salami. Fatback. Hotdogs. Bratwurst. Salted nuts and seeds. Canned beans with added salt. Canned or smoked fish. Whole eggs or egg yolks. Chicken or turkey with skin. Dairy Whole or 2% milk, cream, and half-and-half. Whole or full-fat cream cheese. Whole-fat or sweetened yogurt. Full-fat cheese. Nondairy creamers. Whipped toppings.  Processed cheese and cheese spreads. Fats and oils Butter. Stick margarine. Lard. Shortening. Ghee. Bacon fat. Tropical oils, such as coconut, palm kernel, or palm oil. Seasoning and other foods Salted popcorn and pretzels. Onion salt, garlic salt, seasoned salt, table salt, and sea salt. Worcestershire sauce. Tartar sauce. Barbecue sauce. Teriyaki sauce. Soy sauce, including reduced-sodium. Steak sauce. Canned and packaged gravies. Fish sauce. Oyster sauce. Cocktail sauce. Horseradish that you find on the shelf. Ketchup. Mustard. Meat flavorings and tenderizers. Bouillon cubes. Hot sauce and Tabasco sauce. Premade or packaged marinades. Premade or packaged taco seasonings. Relishes. Regular salad dressings. Where to find more information:  National Heart, Lung, and Blood Institute: www.nhlbi.nih.gov  American Heart Association: www.heart.org Summary  The DASH eating plan is a healthy eating plan that has been shown to reduce high blood pressure (hypertension). It may also reduce your risk for type 2 diabetes, heart disease, and stroke.  With the DASH eating plan, you should limit salt (sodium) intake to 2,300 mg a day. If you have hypertension, you may need to reduce your sodium intake to 1,500 mg a day.  When on the DASH eating plan, aim to eat more fresh fruits and vegetables, whole grains, lean proteins, low-fat dairy, and heart-healthy fats.  Work with your health care provider or diet and nutrition specialist (dietitian) to adjust your eating plan to your individual   calorie needs. This information is not intended to replace advice given to you by your health care provider. Make sure you discuss any questions you have with your health care provider. Document Released: 11/27/2011 Document Revised: 12/01/2016 Document Reviewed: 12/01/2016 Elsevier Interactive Patient Education  2017 Elsevier Inc.  

## 2017-10-21 NOTE — Assessment & Plan Note (Signed)
I have personally reviewed the Medicare Annual Wellness questionnaire and have noted 1. The patient's medical and social history 2. Their use of alcohol, tobacco or illicit drugs 3. Their current medications and supplements 4. The patient's functional ability including ADL's, fall risks, home safety risks and hearing or visual             impairment. 5. Diet and physical activities 6. Evidence for depression or mood disorders  The patients weight, height, BMI and visual acuity have been recorded in the chart I have made referrals, counseling and provided education to the patient based review of the above and I have provided the pt with a written personalized care plan for preventive services.  I have provided you with a copy of your personalized plan for preventive services. Please take the time to review along with your updated medication list.  Had flu vaccine Pneumovax booster DASH info Exercises regularly No cancer screening due to age

## 2017-11-05 ENCOUNTER — Ambulatory Visit: Payer: Medicare Other | Admitting: Cardiovascular Disease

## 2017-11-11 ENCOUNTER — Ambulatory Visit: Payer: Medicare Other | Admitting: Nurse Practitioner

## 2017-11-11 ENCOUNTER — Encounter: Payer: Self-pay | Admitting: Nurse Practitioner

## 2017-11-11 VITALS — BP 130/70 | HR 70 | Ht 66.5 in | Wt 191.5 lb

## 2017-11-11 DIAGNOSIS — I480 Paroxysmal atrial fibrillation: Secondary | ICD-10-CM | POA: Diagnosis not present

## 2017-11-11 DIAGNOSIS — I10 Essential (primary) hypertension: Secondary | ICD-10-CM

## 2017-11-11 DIAGNOSIS — E782 Mixed hyperlipidemia: Secondary | ICD-10-CM | POA: Diagnosis not present

## 2017-11-11 MED ORDER — DILTIAZEM HCL 30 MG PO TABS: 30.0000 mg | ORAL_TABLET | Freq: Three times a day (TID) | ORAL | 6 refills | Status: DC | PRN

## 2017-11-11 MED ORDER — PROPRANOLOL HCL 10 MG PO TABS: 10.0000 mg | ORAL_TABLET | Freq: Three times a day (TID) | ORAL | 6 refills | Status: DC | PRN

## 2017-11-11 NOTE — Progress Notes (Signed)
Office Visit    Patient Name: Jennifer Schmitt Date of Encounter: 11/11/2017  Primary Care Provider:  Venia Carbon, MD Primary Cardiologist:  Johnny Bridge, MD   Chief Complaint    82 y/o ? with a h/o paroxysmal AFib, HTN, HL, and OA, who presents for f/u.  Past Medical History    Past Medical History:  Diagnosis Date  . Adenomatous colon polyp   . Allergic rhinitis due to pollen   . Degenerative disc disease    cervical and lumbar  . GERD (gastroesophageal reflux disease)   . Hearing loss in right ear   . Hyperlipidemia   . Hypertension   . Osteoarthritis, multiple sites   . PAF (paroxysmal atrial fibrillation) ([place])    a. 01/2013 Echo [facility]): EF 60-65%, no rwma, mildly dil LA/RA, mild AI/TR, trace MR, nl RV size/fxn, mild PAH; b. CHA2DS2VASc = 4-->Chronic Xarelto.  Marland Kitchen PMR (polymyalgia rheumatica) ([facility])   . Rheumatic fever   . Urge incontinence   . Vertigo    Past Surgical History:  Procedure Laterality Date  . ABDOMINAL HYSTERECTOMY    . ANKLE FRACTURE SURGERY Left 1996  . BREAST BIOPSY Left 2010   negative  . DEXA  11/09/06   normal  . Meningitis  1963  . SPINE SURGERY    . SYNOVECTOMY Right 1973   elbow  . TONSILLECTOMY AND ADENOIDECTOMY      Allergies  Allergies  Allergen Reactions  . Actonel [Risedronate Sodium] Nausea And Vomiting  . Boniva [Ibandronic Acid] Nausea And Vomiting  . Diclofenac Other (See Comments)    Voltaren Gel aggravated her sinus passages  . Fosamax [Alendronate Sodium] Nausea And Vomiting  . Lipitor [Atorvastatin] Other (See Comments)    Reaction:  Muscle aches   . Miacalcin [Calcitonin ([place])] Other (See Comments)    Reaction:  Sneezing   . Talwin [Pentazocine] Other (See Comments)    Reaction:  Hallucinations   . Latex Rash  . Niaspan [Niacin Er] Swelling and Rash    History of Present Illness    82 y/o ? with a history of paroxysmal atrial fibrillation, hypertension, hyperlipidemia, GERD, and  arthritis.  A. fib history dates back several years and she has been well managed with oral beta-blocker therapy along with as needed short acting diltiazem and or propranolol.  She previously was noted to have normal LV function by echocardiogram in February 2014.  She is chronically anticoagulated with Xarelto.  She was last seen in clinic in October 2017.  Since then, she reports doing well from a cardiac standpoint.  She has had 2, maybe 3, episodes of paroxysmal atrial fibrillation over the past year.  Only on one occasion, which occurred earlier this week, she have to take a as needed diltiazem.  Symptoms lasted about 30 minutes and resolved after about 1 hour.  Symptoms are typically limited to palpitations and she denies chest pain, dyspnea, presyncope, or syncope.  She recently had lab work which showed stable H&H and renal function.  She has been exercising most days of the week for about 30 minutes, using a recumbent bicycle and then also doing some weight and balance training.  She has not had any recent change in exercise tolerance.  She is planning on leaving for [place] for the next 3+ months, shortly after Thanksgiving.  She does have a cardiologist down there.  Home Medications    Prior to Admission medications   Medication Sig Start Date End Date Taking?  Authorizing Provider  acetaminophen (TYLENOL) 500 MG tablet Take 1,000 mg by mouth every 4 (four) hours as needed for mild pain.    Yes [provider]  ALPRAZolam (XANAX) 0.25 MG tablet Take 1 tablet (0.25 mg total) by mouth daily as needed for anxiety or sleep. 10/21/17  Yes Venia Carbon, MD  ARTIFICIAL TEAR OP Apply 1 drop to eye 2 (two) times daily as needed (for dry eyes).    Yes [provider]  cholecalciferol (VITAMIN D) 1000 units tablet Take 1,000 Units by mouth daily.   Yes [provider]  cyanocobalamin (,VITAMIN B-12,) 1000 MCG/ML injection Inject 1 mL (1,000 mcg total) into the muscle every  30 (thirty) days. Pt gets an injection every 30 days 10/01/17  Yes Venia Carbon, MD  diltiazem (CARDIZEM) 30 MG tablet Take 30 mg by mouth 3 (three) times daily as needed.   Yes [provider]  folic acid (FOLVITE) 1 MG tablet TAKE 1 TABLET EVERY DAY 01/02/17  Yes [name] I, MD  ipratropium (ATROVENT) 0.06 % nasal spray  08/26/16  Yes [provider]  levocetirizine (XYZAL) 5 MG tablet Take 5 mg by mouth every evening.   Yes [provider]  meclizine (ANTIVERT) 25 MG tablet Take 1 tablet (25 mg total) by mouth 3 (three) times daily as needed for dizziness or nausea. 05/31/15  Yes Earleen Newport, MD  Melatonin 5 MG TABS Take 1 tablet by mouth at bedtime as needed.   Yes [provider]  metoprolol succinate (TOPROL-XL) 25 MG 24 hr tablet TAKE 1 TABLET (25 MG TOTAL) BY MOUTH DAILY. 08/14/17  Yes Minna Merritts, MD  nystatin cream (MYCOSTATIN) Apply 1 application topically 2 (two) times daily as needed (for rash). 10/21/17  Yes Venia Carbon, MD  Probiotic CAPS Take 1 capsule by mouth daily.   Yes [provider]  propranolol (INDERAL) 10 MG tablet Take 1 tablet (10 mg total) by mouth 3 (three) times daily as needed. 04/21/16  Yes Gollan, Kathlene November, MD  rivaroxaban (XARELTO) 20 MG TABS tablet Take 1 tablet (20 mg total) by mouth daily with supper. 03/27/15  Yes Minna Merritts, MD  rosuvastatin (CRESTOR) 5 MG tablet Take 1 tablet (5 mg total) by mouth daily. Patient taking differently: Take 5 mg by mouth daily. Every other day 10/20/16 11/11/17 Yes Gollan, Kathlene November, MD  valsartan-hydrochlorothiazide (DIOVAN-HCT) 80-12.5 MG tablet TAKE 1 TABLET EVERY DAY Patient taking differently: TAKE 0.5 TABLET EVERY DAY 02/16/17  Yes Venia Carbon, MD    Review of Systems    2-3 episodes of A. fib over the past year.  The last one occurred 2 days ago and lasted about an hour-resolving after taking as needed diltiazem.  She denies chest pain,  dyspnea, PND, orthopnea, dizziness, syncope, edema, or early satiety.  All other systems reviewed and are otherwise negative except as noted above.  Physical Exam    VS:  BP 130/70 (BP Location: Left Arm, Patient Position: Sitting, Cuff Size: Normal)   Pulse 70   Ht 5' 6.5" (1.689 m)   Wt 191 lb 8 oz (86.9 kg)   BMI 30.45 kg/m  , BMI Body mass index is 30.45 kg/m. GEN: Well nourished, well developed, in no acute distress.  HEENT: normal.  Neck: Supple, no JVD, carotid bruits, or masses. Cardiac: RRR, no murmurs, rubs, or gallops. No clubbing, cyanosis, edema.  Radials/DP/PT 2+ and equal bilaterally.  Respiratory:  Respirations regular and unlabored,  clear to auscultation bilaterally. GI: Soft, nontender, nondistended, BS + x 4. MS: no deformity or atrophy. Skin: warm and dry, no rash. Neuro:  Strength and sensation are intact. Psych: Normal affect.  Accessory Clinical Findings    ECG-regular sinus rhythm, 70, no acute ST or T changes.  Assessment & Plan    1.  Paroxysmal atrial fibrillation: Over the past year, patient has had between 2 and 3 episodes of paroxysmal atrial fibrillation.  She had an episode 2 days ago, which came on suddenly, lasted about an hour, and resolved after taking a as needed diltiazem.  Overall, she is pleased with her limited exposure to A. fib over the past year and also as needed therapy to help break it.  She does need refills on her as needed propranolol and diltiazem, and we will provide these today.  She remains on a long-acting beta-blocker and Xarelto therapy in the setting of a CHA2DS2VASc of 4.  2.  Essential hypertension: Stable on beta-blocker and ARB/diuretic therapy.   3.  Hyperlipidemia: LDL was recently 81 on October 31.  LFTs were normal.  She remains on rosuvastatin therapy.  4.  Disposition: Follow-up with Dr. Rockey Situ in 6 months or sooner if necessary.   Murray Hodgkins, NP 11/11/2017, 2:11 PM

## 2017-11-11 NOTE — Patient Instructions (Signed)
Medication Instructions:  Your physician recommends that you continue on your current medications as directed. Please refer to the Current Medication list given to you today.   Labwork: none  Testing/Procedures: none  Follow-Up: Your physician wants you to follow-up in: 6 months with Dr. Gollan.  You will receive a reminder letter in the mail two months in advance. If you don't receive a letter, please call our office to schedule the follow-up appointment.   Any Other Special Instructions Will Be Listed Below (If Applicable).     If you need a refill on your cardiac medications before your next appointment, please call your pharmacy.   

## 2017-11-13 ENCOUNTER — Other Ambulatory Visit: Payer: Self-pay | Admitting: Cardiovascular Disease

## 2017-11-18 NOTE — Addendum Note (Signed)
Addended by: Britt Bottom on: 11/18/2017 08:49 AM   Modules accepted: Orders

## 2017-11-20 ENCOUNTER — Other Ambulatory Visit: Payer: Self-pay | Admitting: *Deleted

## 2017-11-20 NOTE — Telephone Encounter (Signed)
Instruction has changed per chart. pls advise

## 2017-11-20 NOTE — Telephone Encounter (Signed)
Approved: confirm she is taking 1/2 daily Then okay #[phone] x 3

## 2017-11-23 ENCOUNTER — Ambulatory Visit (INDEPENDENT_AMBULATORY_CARE_PROVIDER_SITE_OTHER): Payer: Medicare Other | Admitting: Obstetrics & Gynecology

## 2017-11-23 ENCOUNTER — Encounter: Payer: Self-pay | Admitting: Obstetrics & Gynecology

## 2017-11-23 VITALS — BP 128/70 | HR 71 | Ht 66.5 in | Wt 193.0 lb

## 2017-11-23 DIAGNOSIS — N952 Postmenopausal atrophic vaginitis: Secondary | ICD-10-CM | POA: Insufficient documentation

## 2017-11-23 DIAGNOSIS — Z01419 Encounter for gynecological examination (general) (routine) without abnormal findings: Secondary | ICD-10-CM | POA: Diagnosis not present

## 2017-11-23 DIAGNOSIS — Z1239 Encounter for other screening for malignant neoplasm of breast: Secondary | ICD-10-CM

## 2017-11-23 DIAGNOSIS — Z1231 Encounter for screening mammogram for malignant neoplasm of breast: Secondary | ICD-10-CM | POA: Diagnosis not present

## 2017-11-23 DIAGNOSIS — Z1272 Encounter for screening for malignant neoplasm of vagina: Secondary | ICD-10-CM

## 2017-11-23 DIAGNOSIS — Z0000 Encounter for general adult medical examination without abnormal findings: Secondary | ICD-10-CM

## 2017-11-23 DIAGNOSIS — N3281 Overactive bladder: Secondary | ICD-10-CM

## 2017-11-23 MED ORDER — VALSARTAN-HYDROCHLOROTHIAZIDE 80-12.5 MG PO TABS: ORAL_TABLET | ORAL | 3 refills | Status: DC

## 2017-11-23 MED ORDER — ESTRADIOL 4 MCG VA INST: 1.0000 | VAGINAL_INSERT | Freq: Every day | VAGINAL | 0 refills | Status: DC

## 2017-11-23 MED ORDER — ESTRADIOL 4 MCG VA INST: 1.0000 | VAGINAL_INSERT | VAGINAL | 11 refills | Status: DC

## 2017-11-23 NOTE — Patient Instructions (Signed)
Atrophic Vaginitis Atrophic vaginitis is a condition in which the tissues that line the vagina become dry and thin. This condition is most common in women who have stopped having regular menstrual periods (menopause). This usually starts when a woman is 45-80 years old. Estrogen helps to keep the vagina moist. It stimulates the vagina to produce a clear fluid that lubricates the vagina for sexual intercourse. This fluid also protects the vagina from infection. Lack of estrogen can cause the lining of the vagina to get thinner and dryer. The vagina may also shrink in size. It may become less elastic. Atrophic vaginitis tends to get worse over time as a woman's estrogen level drops. What are the causes? This condition is caused by the normal drop in estrogen that happens around the time of menopause. What increases the risk? Certain conditions or situations may lower a woman's estrogen level, which increases her risk of atrophic vaginitis. These include:  Taking medicine that blocks estrogen.  Having ovaries removed surgically.  Being treated for cancer with X-ray treatment (radiation) or medicines (chemotherapy).  Exercising very hard and often.  Having an eating disorder (anorexia).  Giving birth or breastfeeding.  Being over the age of 50.  Smoking.  What are the signs or symptoms? Symptoms of this condition include:  Pain, soreness, or bleeding during sexual intercourse (dyspareunia).  Vaginal burning, irritation, or itching.  Pain or bleeding during a vaginal examination using a speculum (pelvic exam).  Loss of interest in sexual activity.  Having burning pain when passing urine.  Vaginal discharge that is brown or yellow.  In some cases, there are no symptoms. How is this diagnosed? This condition is diagnosed with a medical history and physical exam. This will include a pelvic exam that checks whether the inside of your vagina appears pale, thin, or dry. Rarely, you may  also have other tests, including:  A urine test.  A test that checks the acid balance in your vaginal fluid (acid balance test).  How is this treated? Treatment for this condition may depend on the severity of your symptoms. Treatment may include:  Using an over-the-counter vaginal lubricant before you have sexual intercourse.  Using a long-acting vaginal moisturizer.  Using low-dose vaginal estrogen for moderate to severe symptoms that do not respond to other treatments. Options include creams, tablets, and inserts (vaginal rings). Before using vaginal estrogen, tell your health care provider if you have a history of: ? Breast cancer. ? Endometrial cancer. ? Blood clots.  Taking medicines. You may be able to take a daily pill for dyspareunia. Discuss all of the risks of this medicine with your health care provider. It is usually not recommended for women who have a family history or personal history of breast cancer.  If your symptoms are very mild and you are not sexually active, you may not need treatment. Follow these instructions at home:  Take medicines only as directed by your health care provider. Do not use herbal or alternative medicines unless your health care provider says that you can.  Use over-the-counter creams, lubricants, or moisturizers for dryness only as directed by your health care provider.  If your atrophic vaginitis is caused by menopause, discuss all of your menopausal symptoms and treatment options with your health care provider.  Do not douche.  Do not use products that can make your vagina dry. These include: ? Scented feminine sprays. ? Scented tampons. ? Scented soaps.  If it hurts to have sex, talk with your sexual   partner. Contact a health care provider if:  Your discharge looks different than normal.  Your vagina has an unusual smell.  You have new symptoms.  Your symptoms do not improve with treatment.  Your symptoms get worse. This  information is not intended to replace advice given to you by your health care provider. Make sure you discuss any questions you have with your health care provider. Document Released: 04/24/2015 Document Revised: 05/15/2016 Document Reviewed: 11/29/2014 Elsevier Interactive Patient Education  2018 Elsevier Inc.  

## 2017-11-23 NOTE — Progress Notes (Signed)
HPI:      Jennifer Schmitt is a 82 y.o. G1P1001 who LMP was in the past, she presents today for her annual examination.  The patient has no complaints today. The patient is not sexually active. Herlast pap: years ago, normal (has had TAH BSO) and last mammogram: also years ago, normal in past.  The patient does perform self breast exams.  There is no notable family history of breast or ovarian cancer in her family. The patient is not taking hormone replacement therapy. Patient denies post-menopausal vaginal bleeding.   The patient has regular exercise: yes. The patient denies current symptoms of depression.    Pt describes vaginal irritation more and more now.  Rare discharge.  Itching present. Also describes nocturia (3-4/night), freq and urgency with LOU at times with urgency.  Denies LOU w cough or stress.  Prior sling surgery 2003.  Wears pad due to risk of leakage.  GYN Hx: Last Colonoscopy:10 years ago. Normal.  Last DEXA: several years ago.    PMHx: Past Medical History:  Diagnosis Date  . Adenomatous colon polyp   . Allergic rhinitis due to pollen   . Degenerative disc disease    cervical and lumbar  . GERD (gastroesophageal reflux disease)   . Hearing loss in right ear   . Hyperlipidemia   . Hypertension   . Osteoarthritis, multiple sites   . PAF (paroxysmal atrial fibrillation) ([place])    a. 01/2013 Echo [facility]): EF 60-65%, no rwma, mildly dil LA/RA, mild AI/TR, trace MR, nl RV size/fxn, mild PAH; b. CHA2DS2VASc = 4-->Chronic Xarelto.  Marland Kitchen PMR (polymyalgia rheumatica) ([facility])   . Rheumatic fever   . Urge incontinence   . Vertigo    Past Surgical History:  Procedure Laterality Date  . ABDOMINAL HYSTERECTOMY    . ANKLE FRACTURE SURGERY Left 1996  . BREAST BIOPSY Left 2010   negative  . DEXA  11/09/06   normal  . Meningitis  1963  . SPINE SURGERY    . SYNOVECTOMY Right 1973   elbow  . TONSILLECTOMY AND ADENOIDECTOMY     Family History  Problem Relation  Age of Onset  . Schizophrenia Mother        paranoid  . Heart disease Mother        from psych meds  . Arthritis Mother   . Rheum arthritis Mother   . Cancer Father        prostate  . Arthritis Father   . Diabetes Father    Social History   Tobacco Use  . Smoking status: Never Smoker  . Smokeless tobacco: Never Used  Substance Use Topics  . Alcohol use: Yes  . Drug use: No    Current Outpatient Medications:  .  acetaminophen (TYLENOL) 500 MG tablet, Take 1,000 mg by mouth every 4 (four) hours as needed for mild pain. , Disp: , Rfl:  .  ALPRAZolam (XANAX) 0.25 MG tablet, Take 1 tablet (0.25 mg total) by mouth daily as needed for anxiety or sleep., Disp: 30 tablet, Rfl: 0 .  ARTIFICIAL TEAR OP, Apply 1 drop to eye 2 (two) times daily as needed (for dry eyes). , Disp: , Rfl:  .  cholecalciferol (VITAMIN D) 1000 units tablet, Take 1,000 Units by mouth daily., Disp: , Rfl:  .  cyanocobalamin (,VITAMIN B-12,) 1000 MCG/ML injection, Inject 1 mL (1,000 mcg total) into the muscle every 30 (thirty) days. Pt gets an injection every 30 days, Disp: 3 mL, Rfl: 3 .  diltiazem (CARDIZEM) 30 MG tablet, Take 1 tablet (30 mg total) by mouth 3 (three) times daily as needed., Disp: 30 tablet, Rfl: 6 .  Estradiol (IMVEXXY MAINTENANCE PACK) 4 MCG INST, Place 1 ampule vaginally 2 (two) times a week., Disp: 8 each, Rfl: 11 .  Estradiol (IMVEXXY STARTER PACK) 4 MCG INST, Place 1 ampule vaginally daily. Daily for 2 weeks then twice weekly, Disp: 18 each, Rfl: 0 .  folic acid (FOLVITE) 1 MG tablet, TAKE 1 TABLET EVERY DAY, Disp: 90 tablet, Rfl: 3 .  ipratropium (ATROVENT) 0.06 % nasal spray, , Disp: , Rfl:  .  levocetirizine (XYZAL) 5 MG tablet, Take 5 mg by mouth every evening., Disp: , Rfl:  .  meclizine (ANTIVERT) 25 MG tablet, Take 1 tablet (25 mg total) by mouth 3 (three) times daily as needed for dizziness or nausea., Disp: 30 tablet, Rfl: 1 .  Melatonin 5 MG TABS, Take 1 tablet by mouth at bedtime as  needed., Disp: , Rfl:  .  metoprolol succinate (TOPROL-XL) 25 MG 24 hr tablet, TAKE 1 TABLET (25 MG TOTAL) BY MOUTH DAILY., Disp: 90 tablet, Rfl: 0 .  nystatin cream (MYCOSTATIN), Apply 1 application topically 2 (two) times daily as needed (for rash)., Disp: 30 g, Rfl: 5 .  Probiotic CAPS, Take 1 capsule by mouth daily., Disp: , Rfl:  .  propranolol (INDERAL) 10 MG tablet, Take 1 tablet (10 mg total) by mouth 3 (three) times daily as needed., Disp: 30 tablet, Rfl: 6 .  rivaroxaban (XARELTO) 20 MG TABS tablet, Take 1 tablet (20 mg total) by mouth daily with supper., Disp: 90 tablet, Rfl: 3 .  rosuvastatin (CRESTOR) 5 MG tablet, TAKE 1 TABLET (5 MG TOTAL) BY MOUTH DAILY., Disp: 90 tablet, Rfl: 2 .  valsartan-hydrochlorothiazide (DIOVAN-HCT) 80-12.5 MG tablet, TAKE 1 TABLET EVERY DAY (Patient taking differently: TAKE 0.5 TABLET EVERY DAY), Disp: 90 tablet, Rfl: 2 Allergies: Actonel [risedronate sodium]; Boniva [ibandronic acid]; Diclofenac; Fosamax [alendronate sodium]; Lipitor [atorvastatin]; Miacalcin [calcitonin (salmon)]; Talwin [pentazocine]; Latex; and Niaspan [niacin er]  Review of Systems  Constitutional: Negative for chills, fever and malaise/fatigue.  HENT: Negative for congestion, sinus pain and sore throat.   Eyes: Negative for blurred vision and pain.  Respiratory: Negative for cough and wheezing.   Cardiovascular: Negative for chest pain and leg swelling.  Gastrointestinal: Negative for abdominal pain, constipation, diarrhea, heartburn, nausea and vomiting.  Genitourinary: Negative for dysuria, frequency, hematuria and urgency.  Musculoskeletal: Negative for back pain, joint pain, myalgias and neck pain.  Skin: Negative for itching and rash.  Neurological: Negative for dizziness, tremors and weakness.  Endo/Heme/Allergies: Does not bruise/bleed easily.  Psychiatric/Behavioral: Negative for depression. The patient is not nervous/anxious and does not have insomnia.    Objective: BP  128/70   Pulse 71   Ht 5' 6.5" (1.689 m)   Wt 193 lb (87.5 kg)   BMI 30.68 kg/m   Filed Weights   11/23/17 0850  Weight: 193 lb (87.5 kg)   Body mass index is 30.68 kg/m. Physical Exam  Constitutional: She is oriented to person, place, and time. She appears well-developed and well-nourished. No distress.  Genitourinary: Rectum normal and vagina normal. Pelvic exam was performed with patient supine. There is no rash or lesion on the right labia. There is no rash or lesion on the left labia. Vagina exhibits no lesion. No bleeding in the vagina. Right adnexum does not display mass and does not display tenderness. Left adnexum does not display mass and  does not display tenderness.  Genitourinary Comments: Absent Uterus Absent cervix Vaginal cuff well healed Vag atrophy present No cystocele, min vag prolpase  HENT:  Head: Normocephalic and atraumatic. Head is without laceration.  Right Ear: Hearing normal.  Left Ear: Hearing normal.  Nose: No epistaxis.  No foreign bodies.  Mouth/Throat: Uvula is midline, oropharynx is clear and moist and mucous membranes are normal.  Eyes: Pupils are equal, round, and reactive to light.  Neck: Normal range of motion. Neck supple. No thyromegaly present.  Cardiovascular: Normal rate and regular rhythm. Exam reveals no gallop and no friction rub.  No murmur heard. Pulmonary/Chest: Effort normal and breath sounds normal. No respiratory distress. She has no wheezes. Right breast exhibits no mass, no skin change and no tenderness. Left breast exhibits no mass, no skin change and no tenderness.  Abdominal: Soft. Bowel sounds are normal. She exhibits no distension. There is no tenderness. There is no rebound.  Musculoskeletal: Normal range of motion.  Neurological: She is alert and oriented to person, place, and time. No cranial nerve deficit.  Skin: Skin is warm and dry.  Psychiatric: She has a normal mood and affect. Judgment normal.  Vitals  reviewed.  Assessment: Annual Exam 1. Screening for vaginal cancer   2. Screening for breast cancer   3. Vaginal atrophy   4. Overactive bladder   5. Annual physical exam    Plan:            1.  Cervical Screening-  Pap smear done today, Pap smear schedule reviewed with patient  2. Breast screening- Exam annually and mammogram scheduled  3. Colonoscopy every 10 years, guidelines stop at age 75 and pt does not desire any further colon testing; declines Hemoccult testing as she says always positive due to her chronic hemorroids  4. Labs managed by PCP  5. Counseling for hormonal therapy: wants to change HRT or dose due to vaginal dryness.  Last use was ERT stopped age 47.  Now has mostly vag sx's with atrophy present on exam.  OAB may be associated with irritation, and pt prefers to avoid bladder anti-spasmodics currently due to past E on those class of meds.   Will start Imvexxa which is a vaginal ERT ovule, possibly Vagifem based on ins coverage for pt, and use for 2-3 mos and then re-assess sx's.  Consider OAB tx as well if those sx's persist despite vag therapy. Pros and cons and SE of vag ERT discussed.    F/U  Return in about 2 months (around 01/24/2018) for Follow up.  Jennifer Applebaum, Jennifer Schmitt, Loura Pardon Ob/Gyn, [facility] 11/23/2017  9:53 AM

## 2017-11-23 NOTE — Telephone Encounter (Signed)
Spoke to pt who states she is currently taking 1/2 tab daily. She thinks this request may have been an error and she already has medication to last. Pt will contact office back to confirm

## 2017-11-24 LAB — PAP IG (IMAGE GUIDED): PAP Smear Comment: 0

## 2017-11-25 LAB — URINE CULTURE

## 2017-11-26 ENCOUNTER — Telehealth: Payer: Self-pay

## 2017-11-26 NOTE — Telephone Encounter (Signed)
Left samples for pt, will start PA, pt will be leaving Saturday to go to [facility] for 3 months, pt needs to know is there a generic she can try that may be approved faster? Pt aware you are not in the office today

## 2017-11-26 NOTE — Telephone Encounter (Signed)
Pt saw [facility] 11/23/17 & was given a new rx for estradiol suppository (Imvexxy. Insurance is concerned about this. Pharmacy was supposed to contact us regarding this. She will run out of her samples tonight & is leaving town tomorrow. She needs to know if the rx will be authorized or if she needs to go an alternate route (more samples or different rx). Cb#[phone]

## 2017-11-26 NOTE — Telephone Encounter (Signed)
lmtrc

## 2017-11-26 NOTE — Telephone Encounter (Signed)
Can call in Vagifem as alternative.  Where to call?

## 2017-11-27 NOTE — Telephone Encounter (Signed)
OK thx  

## 2017-11-27 NOTE — Telephone Encounter (Signed)
I got the PA approved so pt should be ok now

## 2018-01-07 ENCOUNTER — Other Ambulatory Visit: Payer: Self-pay | Admitting: Internal Medicine

## 2018-02-15 ENCOUNTER — Other Ambulatory Visit: Payer: Self-pay | Admitting: Cardiovascular Disease

## 2018-04-19 ENCOUNTER — Other Ambulatory Visit: Payer: Self-pay | Admitting: Internal Medicine

## 2018-05-14 ENCOUNTER — Other Ambulatory Visit: Payer: Self-pay | Admitting: Cardiovascular Disease

## 2018-05-14 NOTE — Telephone Encounter (Signed)
Please review for refill. Thanks!  

## 2018-07-06 ENCOUNTER — Other Ambulatory Visit: Payer: Self-pay | Admitting: Internal Medicine

## 2018-08-07 ENCOUNTER — Other Ambulatory Visit: Payer: Self-pay | Admitting: Cardiovascular Disease

## 2018-08-09 ENCOUNTER — Telehealth: Payer: Self-pay | Admitting: Cardiovascular Disease

## 2018-08-09 ENCOUNTER — Other Ambulatory Visit: Payer: Self-pay | Admitting: *Deleted

## 2018-08-09 MED ORDER — METOPROLOL SUCCINATE ER 25 MG PO TB24: ORAL_TABLET | ORAL | 0 refills | Status: DC

## 2018-08-09 NOTE — Telephone Encounter (Signed)
°*  STAT* If patient is at the pharmacy, call can be transferred to refill team.   1. Which medications need to be refilled? (please list name of each medication and dose if known) metoprolol succinate 25 MG - 1 tablet daily   2. Which pharmacy/location (including street and city if local pharmacy) is medication to be sent to? [facility] on [address]  3. Do they need a 30 day or 90 day supply? 90 day  Scheduled 9/24 with Dr. Rockey Situ

## 2018-08-09 NOTE — Telephone Encounter (Signed)
Requested Prescriptions   Signed Prescriptions Disp Refills  . metoprolol succinate (TOPROL-XL) 25 MG 24 hr tablet 90 tablet 0    Sig: TAKE 1 TABLET (25 MG TOTAL) BY MOUTH DAILY.    Authorizing Provider: Minna Merritts    Ordering User: Britt Bottom

## 2018-09-12 NOTE — Progress Notes (Signed)
Cardiology Office Note  Date:  09/14/2018   ID:  Jennifer Schmitt, DOB 1935/09/04, MRN [id number]  PCP:  Venia Carbon, MD   Chief Complaint  Patient presents with  . other    6 month f/u c/o hypotension. Meds reviewed verbally with pt.    HPI:  [name] is a very pleasant 83 year old woman with history of  paroxysmal atrial fibrillation,  obstructive sleep apnea who does not wear CPAP,  hypertension,  hyperlipidemia  who presents for routine followup of her atrial fibrillation. She spends much of her winter in [place], has a cardiologist there  In follow-up she reports that she is concerned about low heart rate and periodically low blood pressure Does not check her blood pressure on a regular basis, sporadically BP low recently, 502 systolic Heart rate in 77A  Complains of polyuria Denies any significant palpitations or tachycardia concerning for atrial fibrillation  Weight down Active, exercise daily Husband does not exercise Goes to [place] in 11/2018  Reports that she only had one very brief episode of atrial fibrillation  Lasted for several seconds  Stopped her cholesterol pill on her own, felt like she was having muscle ache  EKG personally reviewed by myself on todays visit Shows normal sinus rhythm rate 56 bpm no significant ST or T wave changes  Long discussion concerning her family history Parents with no CAD, no MI Grandmother with CHF  Carotid u/s 2011: intimal thickening She reports that she had recent screening, was told she had no significant carotid disease. She will bring in the report for our review  History of chronic dizziness, vertigo symptoms.  Previously changed from Crestor to generic rosuvastatin, developed leg cramping, had to stop the medication Had similar problems on Lipitor Prior total cholesterol 161, now up to 220  05/31/2015 had severe vomiting, went to the emergency room Electrolytes were normal, no atrial  fibrillation Lab work reviewed with her today from recent lab draw, total cholesterol 160 B-12 improving, sedimentation rate 40  She reports having an episode of atrial fibrillation in August 2015 It lasted approximately 6 hours. She took diltiazem and propranolol.  she converted back to normal sinus rhythm Some restless nights, possibly from sleep apnea. History of headaches  Previously reported palpitations at night, very short-lived   PMH:   has a past medical history of Adenomatous colon polyp, Allergic rhinitis due to pollen, Degenerative disc disease, GERD (gastroesophageal reflux disease), Hearing loss in right ear, Hyperlipidemia, Hypertension, Osteoarthritis, multiple sites, PAF (paroxysmal atrial fibrillation) ([place]), PMR (polymyalgia rheumatica) ([place]), Rheumatic fever, Urge incontinence, and Vertigo.  PSH:    Past Surgical History:  Procedure Laterality Date  . ABDOMINAL HYSTERECTOMY    . ANKLE FRACTURE SURGERY Left 1996  . BREAST BIOPSY Left 2010   negative  . DEXA  11/09/06   normal  . Meningitis  1963  . SPINE SURGERY    . SYNOVECTOMY Right 1973   elbow  . TONSILLECTOMY AND ADENOIDECTOMY      Current Outpatient Medications  Medication Sig Dispense Refill  . acetaminophen (TYLENOL) 500 MG tablet Take 1,000 mg by mouth every 4 (four) hours as needed for mild pain.     Marland Kitchen ALPRAZolam (XANAX) 0.25 MG tablet Take 1 tablet (0.25 mg total) by mouth daily as needed for anxiety or sleep. 30 tablet 0  . ARTIFICIAL TEAR OP Apply 1 drop to eye 2 (two) times daily as needed (for dry eyes).     . cholecalciferol (VITAMIN D) 1000 units  tablet Take 1,000 Units by mouth daily.    . cyanocobalamin (,VITAMIN B-12,) 1000 MCG/ML injection Inject 1 mL (1,000 mcg total) into the muscle every 30 (thirty) days. Pt gets an injection every 30 days 3 mL 3  . diltiazem (CARDIZEM) 30 MG tablet Take 1 tablet (30 mg total) by mouth 3 (three) times daily as needed. 30 tablet 6  . Estradiol (IMVEXXY  MAINTENANCE PACK) 4 MCG INST Place 1 ampule vaginally 2 (two) times a week. 8 each 11  . folic acid (FOLVITE) 1 MG tablet TAKE 1 TABLET EVERY DAY 90 tablet 3  . ipratropium (ATROVENT) 0.06 % nasal spray Place 1 spray into both nostrils daily.     Marland Kitchen levocetirizine (XYZAL) 5 MG tablet Take 5 mg by mouth every evening.    . nystatin cream (MYCOSTATIN) Apply 1 application topically 2 (two) times daily as needed (for rash). 30 g 5  . Probiotic CAPS Take 1 capsule by mouth daily.    . propranolol (INDERAL) 10 MG tablet Take 1 tablet (10 mg total) by mouth 3 (three) times daily as needed. 30 tablet 6  . rosuvastatin (CRESTOR) 5 MG tablet TAKE 1 TABLET (5 MG TOTAL) BY MOUTH DAILY. 90 tablet 2  . valsartan-hydrochlorothiazide (DIOVAN-HCT) 80-12.5 MG tablet TAKE 0.5 TABLET EVERY DAY 45 tablet 3  . XARELTO 20 MG TABS tablet TAKE 1 TABLET BY MOUTH EVERY DAY WITH EVENING MEAL 90 tablet 1  . PAZEO 0.7 % SOLN Place 1 drop into both eyes at bedtime.  6   No current facility-administered medications for this visit.      Allergies:   Actonel [risedronate sodium]; Boniva [ibandronic acid]; Diclofenac; Fosamax [alendronate sodium]; Lipitor [atorvastatin]; Miacalcin [calcitonin (salmon)]; Talwin [pentazocine]; Latex; and Niaspan [niacin er]   Social History:  The patient  reports that she has never smoked. She has never used smokeless tobacco. She reports that she drinks alcohol. She reports that she does not use drugs.   Family History:   family history includes Arthritis in her father and mother; Cancer in her father; Diabetes in her father; Heart disease in her mother; Rheum arthritis in her mother; Schizophrenia in her mother.    Review of Systems: Review of Systems  Constitutional: Negative.   Respiratory: Negative.   Cardiovascular: Negative.   Gastrointestinal: Negative.   Musculoskeletal: Negative.   Neurological: Negative.   Psychiatric/Behavioral: Negative.   All other systems reviewed and are  negative.    PHYSICAL EXAM: VS:  BP (!) 144/74 (BP Location: Left Arm, Patient Position: Sitting, Cuff Size: Normal)   Pulse (!) 56   Ht 5' 6.5" (1.689 m)   Wt 187 lb (84.8 kg)   BMI 29.73 kg/m  , BMI Body mass index is 29.73 kg/m. Constitutional:  oriented to person, place, and time. No distress.  HENT:  Head: Normocephalic and atraumatic.  Eyes:  no discharge. No scleral icterus.  Neck: Normal range of motion. Neck supple. No JVD present.  Cardiovascular: Normal rate, regular rhythm, normal heart sounds and intact distal pulses. Exam reveals no gallop and no friction rub. No edema No murmur heard. Pulmonary/Chest: Effort normal and breath sounds normal. No stridor. No respiratory distress.  no wheezes.  no rales.  no tenderness.  Abdominal: Soft.  no distension.  no tenderness.  Musculoskeletal: Normal range of motion.  no  tenderness or deformity.  Neurological:  normal muscle tone. Coordination normal. No atrophy Skin: Skin is warm and dry. No rash noted. not diaphoretic.  Psychiatric:  normal  mood and affect. behavior is normal. Thought content normal.    Recent Labs: 10/21/2017: ALT 13; BUN 19; Creatinine, Ser 0.75; Hemoglobin 12.6; Platelets 248.0; Potassium 4.7; Sodium 140    Lipid Panel Lab Results  Component Value Date   CHOL 165 10/21/2017   HDL 63.00 10/21/2017   LDLCALC 81 10/21/2017   TRIG 106.0 10/21/2017      Wt Readings from Last 3 Encounters:  09/14/18 187 lb (84.8 kg)  11/23/17 193 lb (87.5 kg)  11/11/17 191 lb 8 oz (86.9 kg)       ASSESSMENT AND PLAN:  Paroxysmal atrial fibrillation (HCC) - Plan: EKG 12-Lead Tolerating anticoagulation Recommended she continue her metoprolol Asymptomatic bradycardia  Essential hypertension - Plan: EKG 12-Lead Long discussion with her concerning low blood pressure measurements she is getting at home.  Recommended she monitor blood pressure for the next several weeks If blood pressure is indeed running very  low potentially could separate her valsartan and HCTZ, will stay on very low-dose valsartan and make HCTZ only as needed  Pure hypercholesterolemia Tolerating Crestor 5 daily  Encounter for anticoagulation discussion and counseling Tolerating anticoagulation   Total encounter time more than 25 minutes  Greater than 50% was spent in counseling and coordination of care with the patient   Disposition:   F/U  12 months   Orders Placed This Encounter  Procedures  . EKG 12-Lead     Signed, Esmond Plants, M.D., Ph.D. 09/14/2018  [place], [place]

## 2018-09-14 ENCOUNTER — Ambulatory Visit: Payer: Medicare Other | Admitting: Cardiovascular Disease

## 2018-09-14 ENCOUNTER — Encounter: Payer: Self-pay | Admitting: Cardiovascular Disease

## 2018-09-14 DIAGNOSIS — I10 Essential (primary) hypertension: Secondary | ICD-10-CM | POA: Diagnosis not present

## 2018-09-14 DIAGNOSIS — E782 Mixed hyperlipidemia: Secondary | ICD-10-CM | POA: Diagnosis not present

## 2018-09-14 DIAGNOSIS — I480 Paroxysmal atrial fibrillation: Secondary | ICD-10-CM | POA: Diagnosis not present

## 2018-09-14 MED ORDER — METOPROLOL SUCCINATE ER 25 MG PO TB24: ORAL_TABLET | ORAL | 3 refills | Status: DC

## 2018-09-14 NOTE — Patient Instructions (Signed)

## 2018-10-04 ENCOUNTER — Encounter: Payer: Self-pay | Admitting: *Deleted

## 2018-10-05 ENCOUNTER — Ambulatory Visit: Payer: Medicare Other | Admitting: Certified Registered Nurse Anesthetist

## 2018-10-05 ENCOUNTER — Other Ambulatory Visit: Payer: Self-pay

## 2018-10-05 ENCOUNTER — Ambulatory Visit
Admission: RE | Admit: 2018-10-05 | Discharge: 2018-10-05 | Disposition: A | Discharge status: Home / Self Care | Payer: Medicare Other | Source: Ambulatory Visit | Attending: Ophthalmology | Admitting: Ophthalmology

## 2018-10-05 ENCOUNTER — Encounter
Admission: RE | Disposition: A | Discharge status: Home / Self Care | Payer: Self-pay | Source: Ambulatory Visit | Attending: Ophthalmology

## 2018-10-05 ENCOUNTER — Other Ambulatory Visit: Payer: Self-pay | Admitting: Internal Medicine

## 2018-10-05 DIAGNOSIS — G4730 Sleep apnea, unspecified: Secondary | ICD-10-CM | POA: Insufficient documentation

## 2018-10-05 DIAGNOSIS — I4891 Unspecified atrial fibrillation: Secondary | ICD-10-CM | POA: Diagnosis not present

## 2018-10-05 DIAGNOSIS — E7800 Pure hypercholesterolemia, unspecified: Secondary | ICD-10-CM | POA: Diagnosis not present

## 2018-10-05 DIAGNOSIS — Z79899 Other long term (current) drug therapy: Secondary | ICD-10-CM | POA: Diagnosis not present

## 2018-10-05 DIAGNOSIS — K219 Gastro-esophageal reflux disease without esophagitis: Secondary | ICD-10-CM | POA: Diagnosis not present

## 2018-10-05 DIAGNOSIS — H2512 Age-related nuclear cataract, left eye: Secondary | ICD-10-CM | POA: Diagnosis present

## 2018-10-05 DIAGNOSIS — I10 Essential (primary) hypertension: Secondary | ICD-10-CM | POA: Insufficient documentation

## 2018-10-05 DIAGNOSIS — Z87891 Personal history of nicotine dependence: Secondary | ICD-10-CM | POA: Diagnosis not present

## 2018-10-05 DIAGNOSIS — Z7902 Long term (current) use of antithrombotics/antiplatelets: Secondary | ICD-10-CM | POA: Diagnosis not present

## 2018-10-05 HISTORY — DX: Other complications of anesthesia, initial encounter: T88.59XA

## 2018-10-05 HISTORY — DX: Unspecified hearing loss, unspecified ear: H91.90

## 2018-10-05 HISTORY — DX: Cardiac murmur, unspecified: R01.1

## 2018-10-05 HISTORY — DX: Other specified postprocedural states: R11.2

## 2018-10-05 HISTORY — DX: Bronchiectasis, uncomplicated: J47.9

## 2018-10-05 HISTORY — DX: Adverse effect of unspecified anesthetic, initial encounter: T41.45XA

## 2018-10-05 HISTORY — DX: Cardiac arrhythmia, unspecified: I49.9

## 2018-10-05 HISTORY — DX: Sleep apnea, unspecified: G47.30

## 2018-10-05 HISTORY — DX: Other specified postprocedural states: Z98.890

## 2018-10-05 HISTORY — DX: Anemia, unspecified: D64.9

## 2018-10-05 HISTORY — PX: CATARACT EXTRACTION W/PHACO: SHX586

## 2018-10-05 HISTORY — DX: Anxiety disorder, unspecified: F41.9

## 2018-10-05 SURGERY — PHACOEMULSIFICATION, CATARACT, WITH IOL INSERTION
Anesthesia: Monitor Anesthesia Care | Site: Eye | Laterality: Left

## 2018-10-05 MED ORDER — ONDANSETRON HCL 4 MG/2ML IJ SOLN
INTRAMUSCULAR | Status: DC | PRN
  Administered 2018-10-05: 4 mg via INTRAVENOUS

## 2018-10-05 MED ORDER — MOXIFLOXACIN HCL 0.5 % OP SOLN: 1.0000 [drp] | OPHTHALMIC | Status: DC | PRN

## 2018-10-05 MED ORDER — EPINEPHRINE PF 1 MG/ML IJ SOLN
INTRAOCULAR | Status: DC | PRN
  Administered 2018-10-05: 1 mL via OPHTHALMIC

## 2018-10-05 MED ORDER — ARMC OPHTHALMIC DILATING DROPS
OPHTHALMIC | Status: AC
  Administered 2018-10-05 (×3)
  Filled 2018-10-05: qty 0.5

## 2018-10-05 MED ORDER — SODIUM CHLORIDE 0.9 % IV SOLN
INTRAVENOUS | Status: DC
  Administered 2018-10-05: 12:00:00 via INTRAVENOUS

## 2018-10-05 MED ORDER — TETRACAINE HCL 0.5 % OP SOLN
OPHTHALMIC | Status: AC
  Administered 2018-10-05: 1 [drp] via OPHTHALMIC
  Filled 2018-10-05: qty 4

## 2018-10-05 MED ORDER — LIDOCAINE HCL (PF) 4 % IJ SOLN
INTRAOCULAR | Status: DC | PRN
  Administered 2018-10-05: 2 mL via OPHTHALMIC

## 2018-10-05 MED ORDER — ONDANSETRON HCL 4 MG/2ML IJ SOLN
INTRAMUSCULAR | Status: AC
  Filled 2018-10-05: qty 2

## 2018-10-05 MED ORDER — POVIDONE-IODINE 5 % OP SOLN
OPHTHALMIC | Status: DC | PRN
  Administered 2018-10-05: 1 via OPHTHALMIC

## 2018-10-05 MED ORDER — MOXIFLOXACIN HCL 0.5 % OP SOLN
OPHTHALMIC | Status: AC
  Filled 2018-10-05: qty 3

## 2018-10-05 MED ORDER — LIDOCAINE HCL (PF) 4 % IJ SOLN
INTRAMUSCULAR | Status: AC
  Filled 2018-10-05: qty 5

## 2018-10-05 MED ORDER — NA CHONDROIT SULF-NA HYALURON 40-17 MG/ML IO SOLN
INTRAOCULAR | Status: DC | PRN
  Administered 2018-10-05: 1 mL via INTRAOCULAR

## 2018-10-05 MED ORDER — MIDAZOLAM HCL 2 MG/2ML IJ SOLN
INTRAMUSCULAR | Status: DC | PRN
  Administered 2018-10-05 (×4): 0.5 mg via INTRAVENOUS

## 2018-10-05 MED ORDER — EPINEPHRINE PF 1 MG/ML IJ SOLN
INTRAMUSCULAR | Status: AC
  Filled 2018-10-05: qty 1

## 2018-10-05 MED ORDER — MOXIFLOXACIN HCL 0.5 % OP SOLN
OPHTHALMIC | Status: DC | PRN
  Administered 2018-10-05: .2 mL via OPHTHALMIC

## 2018-10-05 MED ORDER — TETRACAINE HCL 0.5 % OP SOLN
1.0000 [drp] | OPHTHALMIC | Status: AC | PRN
  Administered 2018-10-05 (×3): 1 [drp] via OPHTHALMIC

## 2018-10-05 MED ORDER — POVIDONE-IODINE 5 % OP SOLN
OPHTHALMIC | Status: AC
  Filled 2018-10-05: qty 30

## 2018-10-05 MED ORDER — MIDAZOLAM HCL 2 MG/2ML IJ SOLN
INTRAMUSCULAR | Status: AC
  Filled 2018-10-05: qty 2

## 2018-10-05 MED ORDER — NA CHONDROIT SULF-NA HYALURON 40-17 MG/ML IO SOLN
INTRAOCULAR | Status: AC
  Filled 2018-10-05: qty 1

## 2018-10-05 SURGICAL SUPPLY — 16 items
GLOVE BIO SURGEON STRL SZ8 (GLOVE) ×3 IMPLANT
GLOVE BIOGEL M 6.5 STRL (GLOVE) ×3 IMPLANT
GLOVE SURG LX 8.0 MICRO (GLOVE) ×2
GLOVE SURG LX STRL 8.0 MICRO (GLOVE) ×1 IMPLANT
GOWN STRL REUS W/ TWL LRG LVL3 (GOWN DISPOSABLE) ×2 IMPLANT
GOWN STRL REUS W/TWL LRG LVL3 (GOWN DISPOSABLE) ×4
LABEL CATARACT MEDS ST (LABEL) ×3 IMPLANT
LENS IOL TECNIS ITEC 22.5 (Intraocular Lens) ×2 IMPLANT
PACK CATARACT (MISCELLANEOUS) ×3 IMPLANT
PACK CATARACT BRASINGTON LX (MISCELLANEOUS) ×3 IMPLANT
PACK EYE AFTER SURG (MISCELLANEOUS) ×3 IMPLANT
SOL BSS BAG (MISCELLANEOUS) ×3
SOLUTION BSS BAG (MISCELLANEOUS) ×1 IMPLANT
SYR 5ML LL (SYRINGE) ×3 IMPLANT
WATER STERILE IRR 250ML POUR (IV SOLUTION) ×3 IMPLANT
WIPE NON LINTING 3.25X3.25 (MISCELLANEOUS) ×3 IMPLANT

## 2018-10-05 NOTE — Anesthesia Preprocedure Evaluation (Signed)
Anesthesia Evaluation  Patient identified by MRN, date of birth, ID band Patient awake    Reviewed: Allergy & Precautions, NPO status , Patient's Chart, lab work & pertinent test results  History of Anesthesia Complications (+) PONV and history of anesthetic complications  Airway Mallampati: III  TM Distance: >3 FB Neck ROM: Full    Dental  (+) Implants   Pulmonary sleep apnea , neg COPD,    breath sounds clear to auscultation- rhonchi (-) wheezing      Cardiovascular hypertension, Pt. on medications (-) CAD, (-) Past MI, (-) Cardiac Stents and (-) CABG + dysrhythmias Atrial Fibrillation  Rhythm:Regular Rate:Normal - Systolic murmurs and - Diastolic murmurs    Neuro/Psych PSYCHIATRIC DISORDERS Anxiety negative neurological ROS     GI/Hepatic Neg liver ROS, GERD  ,  Endo/Other  negative endocrine ROSneg diabetes  Renal/GU negative Renal ROS     Musculoskeletal  (+) Arthritis ,   Abdominal (+) - obese,   Peds  Hematology  (+) anemia ,   Anesthesia Other Findings Past Medical History: No date: Adenomatous colon polyp No date: Allergic rhinitis due to pollen No date: Anemia No date: Anxiety No date: Bronchiectasis ([place]) No date: Complication of anesthesia No date: Degenerative disc disease     Comment:  cervical and lumbar No date: Dysrhythmia No date: GERD (gastroesophageal reflux disease) No date: Hearing loss in right ear No date: Heart murmur No date: HOH (hard of hearing)     Comment:  AIDS No date: Hyperlipidemia No date: Hypertension No date: Osteoarthritis, multiple sites No date: PAF (paroxysmal atrial fibrillation) ([place])     Comment:  a. 01/2013 Echo [facility]): EF 60-65%, no rwma,               mildly dil LA/RA, mild AI/TR, trace MR, nl RV size/fxn,               mild PAH; b. CHA2DS2VASc = 4-->Chronic Xarelto. No date: PMR (polymyalgia rheumatica) (HCC) No date: PONV (postoperative  nausea and vomiting) No date: Rheumatic fever No date: Sleep apnea     Comment:  NO CPAP No date: Urge incontinence No date: Vertigo No date: Vertigo   Reproductive/Obstetrics                             Anesthesia Physical Anesthesia Plan  ASA: III  Anesthesia Plan: MAC   Post-op Pain Management:    Induction: Intravenous  PONV Risk Score and Plan: 3 and Midazolam  Airway Management Planned: Natural Airway  Additional Equipment:   Intra-op Plan:   Post-operative Plan:   Informed Consent: I have reviewed the patients History and Physical, chart, labs and discussed the procedure including the risks, benefits and alternatives for the proposed anesthesia with the patient or authorized representative who has indicated his/her understanding and acceptance.     Plan Discussed with: CRNA and Anesthesiologist  Anesthesia Plan Comments:         Anesthesia Quick Evaluation

## 2018-10-05 NOTE — Discharge Instructions (Signed)
Eye Surgery Discharge Instructions    Expect mild scratchy sensation or mild soreness. DO NOT RUB YOUR EYE!  The day of surgery:  Minimal physical activity, but bed rest is not required  No reading, computer work, or close hand work  No bending, lifting, or straining.  May watch TV  For 24 hours:  No driving, legal decisions, or alcoholic beverages  Safety precautions  Eat anything you prefer: It is better to start with liquids, then soup then solid foods.  _____ Eye patch should be worn until postoperative exam tomorrow.  ____ Solar shield eyeglasses should be worn for comfort in the sunlight/patch while sleeping  Resume all regular medications including aspirin or Coumadin if these were discontinued prior to surgery. You may shower, bathe, shave, or wash your hair. Tylenol may be taken for mild discomfort.  Call your doctor if you experience significant pain, nausea, or vomiting, fever > 101 or other signs of infection. [phone] or [phone] Specific instructions:  Follow-up Information    Birder Robson, MD Follow up.   Specialty:  Ophthalmology Why:  October `16 at 8:30am Contact information: [address] [place] [address] [phone]

## 2018-10-05 NOTE — Anesthesia Procedure Notes (Signed)
Date/Time: 10/05/2018 12:10 PM Performed by: Doreen Salvage, CRNA Pre-anesthesia Checklist: Patient identified, Emergency Drugs available, Suction available and Patient being monitored Patient Re-evaluated:Patient Re-evaluated prior to induction Oxygen Delivery Method: Nasal cannula Induction Type: IV induction Dental Injury: Teeth and Oropharynx as per pre-operative assessment  Comments: Nasal cannula with etCO2 monitoring

## 2018-10-05 NOTE — Anesthesia Postprocedure Evaluation (Signed)
Anesthesia Post Note  Patient: Jennifer Schmitt  Procedure(s) Performed: CATARACT EXTRACTION PHACO AND INTRAOCULAR LENS PLACEMENT (IOC) (Left Eye)  Patient location during evaluation: PACU Anesthesia Type: MAC Level of consciousness: awake and alert Pain management: pain level controlled Vital Signs Assessment: post-procedure vital signs reviewed and stable Respiratory status: spontaneous breathing, nonlabored ventilation, respiratory function stable and patient connected to nasal cannula oxygen Cardiovascular status: stable and blood pressure returned to baseline Postop Assessment: no apparent nausea or vomiting Anesthetic complications: no     Last Vitals:  Vitals:   10/05/18 1215 10/05/18 1230  BP:  (!) 119/44  Pulse:    Resp:  18  Temp:  (!) 36.1 C  SpO2: 99% 99%    Last Pain:  Vitals:   10/05/18 1215  TempSrc:   PainSc: 0-No pain                 Alison Stalling

## 2018-10-05 NOTE — Transfer of Care (Signed)
Immediate Anesthesia Transfer of Care Note  Patient: JEIMY BICKERT  Procedure(s) Performed: Procedure(s) with comments: CATARACT EXTRACTION PHACO AND INTRAOCULAR LENS PLACEMENT (IOC) (Left) - [place] 00:40 CDE 6.92 fluid pack lot # [id number]     Patient Location: PHASE II  Anesthesia Type:MAC  Level of Consciousness: Awake, Alert, Oriented  Airway & Oxygen Therapy: Patient Spontanous Breathing and Patient on room air   Post-op Assessment: Report given to RN and Post -op Vital signs reviewed and stable  Post vital signs: Reviewed and stable  Last Vitals:  Vitals:   10/05/18 1215 10/05/18 1230  BP:  (!) 119/44  Pulse:    Resp:  18  Temp:  (!) 36.1 C  SpO2: 62% 03%    Complications: No apparent anesthesia complications

## 2018-10-05 NOTE — H&P (Signed)
All labs reviewed. Abnormal studies sent to patients PCP when indicated.  Previous H&P reviewed, patient examined, there are NO CHANGES.  Gwyndolyn Saxon Porfilio10/15/201912:02 PM

## 2018-10-05 NOTE — Op Note (Signed)
PREOPERATIVE DIAGNOSIS:  Nuclear sclerotic cataract of the left eye.   POSTOPERATIVE DIAGNOSIS:  Nuclear sclerotic cataract of the left eye.   OPERATIVE PROCEDURE: Procedure(s): CATARACT EXTRACTION PHACO AND INTRAOCULAR LENS PLACEMENT (IOC)   SURGEON:  Birder Robson, MD.   ANESTHESIA:  Anesthesiologist: Emmie Niemann, MD CRNA: Doreen Salvage, CRNA  1.      Managed anesthesia care. 2.     0.6ml of Shugarcaine was instilled following the paracentesis   COMPLICATIONS:  None.   TECHNIQUE:   Stop and chop   DESCRIPTION OF PROCEDURE:  The patient was examined and consented in the preoperative holding area where the aforementioned topical anesthesia was applied to the left eye and then brought back to the Operating Room where the left eye was prepped and draped in the usual sterile ophthalmic fashion and a lid speculum was placed. A paracentesis was created with the side port blade and the anterior chamber was filled with viscoelastic. A near clear corneal incision was performed with the steel keratome. A continuous curvilinear capsulorrhexis was performed with a cystotome followed by the capsulorrhexis forceps. Hydrodissection and hydrodelineation were carried out with BSS on a blunt cannula. The lens was removed in a stop and chop  technique and the remaining cortical material was removed with the irrigation-aspiration handpiece. The capsular bag was inflated with viscoelastic and the Technis [id number] lens was placed in the capsular bag without complication. The remaining viscoelastic was removed from the eye with the irrigation-aspiration handpiece. The wounds were hydrated. The anterior chamber was flushed with Miostat and the eye was inflated to physiologic pressure. 0.22ml Vigamox was placed in the anterior chamber. The wounds were found to be water tight. The eye was dressed with Vigamox. The patient was given protective glasses to wear throughout the day and a shield with which to sleep tonight.  The patient was also given drops with which to begin a drop regimen today and will follow-up with me in one day. Implant Name Type Inv. Item Serial No. Manufacturer Lot No. LRB No. Used  LENS IOL DIOP 22.5 - [id number] Intraocular Lens LENS IOL DIOP 22.5 [id number] AMO  Left 1    Procedure(s) with comments: CATARACT EXTRACTION PHACO AND INTRAOCULAR LENS PLACEMENT (IOC) (Left) - [place] 00:40 CDE 6.92 fluid pack lot # [id number]     Electronically signed: Birder Robson 10/05/2018 12:28 PM

## 2018-10-05 NOTE — OR Nursing (Signed)
Discharge instructions discussed with pt and husband. Both voice understanding. 

## 2018-10-05 NOTE — Anesthesia Post-op Follow-up Note (Signed)
Anesthesia QCDR form completed.        

## 2018-10-19 ENCOUNTER — Encounter: Payer: Self-pay | Admitting: *Deleted

## 2018-10-21 ENCOUNTER — Telehealth: Payer: Self-pay

## 2018-10-21 NOTE — Telephone Encounter (Signed)
Pt is having problems getting Imvexxy refilled.  Has tried [facility], and [facility].  [facility] and [facility] can't even fill the rx.  Is there a problem c this medication?  Pt states it really works well and she hates to give it up.  [phone]

## 2018-10-21 NOTE — Telephone Encounter (Signed)
Have some samples for pt to pick up

## 2018-10-26 ENCOUNTER — Encounter: Payer: Self-pay | Admitting: *Deleted

## 2018-10-26 ENCOUNTER — Ambulatory Visit
Admission: RE | Admit: 2018-10-26 | Discharge: 2018-10-26 | Disposition: A | Discharge status: Home / Self Care | Payer: Medicare Other | Source: Ambulatory Visit | Attending: Ophthalmology | Admitting: Ophthalmology

## 2018-10-26 ENCOUNTER — Other Ambulatory Visit: Payer: Self-pay

## 2018-10-26 ENCOUNTER — Ambulatory Visit: Payer: Medicare Other | Admitting: Anesthesiology

## 2018-10-26 ENCOUNTER — Encounter
Admission: RE | Disposition: A | Discharge status: Home / Self Care | Payer: Self-pay | Source: Ambulatory Visit | Attending: Ophthalmology

## 2018-10-26 ENCOUNTER — Encounter: Payer: Medicare Other | Admitting: Internal Medicine

## 2018-10-26 DIAGNOSIS — I480 Paroxysmal atrial fibrillation: Secondary | ICD-10-CM | POA: Insufficient documentation

## 2018-10-26 DIAGNOSIS — Z87891 Personal history of nicotine dependence: Secondary | ICD-10-CM | POA: Diagnosis not present

## 2018-10-26 DIAGNOSIS — Z9842 Cataract extraction status, left eye: Secondary | ICD-10-CM | POA: Diagnosis not present

## 2018-10-26 DIAGNOSIS — Z79890 Hormone replacement therapy (postmenopausal): Secondary | ICD-10-CM | POA: Insufficient documentation

## 2018-10-26 DIAGNOSIS — H9191 Unspecified hearing loss, right ear: Secondary | ICD-10-CM | POA: Diagnosis not present

## 2018-10-26 DIAGNOSIS — I10 Essential (primary) hypertension: Secondary | ICD-10-CM | POA: Diagnosis not present

## 2018-10-26 DIAGNOSIS — Z7901 Long term (current) use of anticoagulants: Secondary | ICD-10-CM | POA: Insufficient documentation

## 2018-10-26 DIAGNOSIS — Z888 Allergy status to other drugs, medicaments and biological substances status: Secondary | ICD-10-CM | POA: Diagnosis not present

## 2018-10-26 DIAGNOSIS — Z79899 Other long term (current) drug therapy: Secondary | ICD-10-CM | POA: Diagnosis not present

## 2018-10-26 DIAGNOSIS — E785 Hyperlipidemia, unspecified: Secondary | ICD-10-CM | POA: Diagnosis not present

## 2018-10-26 DIAGNOSIS — G4730 Sleep apnea, unspecified: Secondary | ICD-10-CM | POA: Diagnosis not present

## 2018-10-26 DIAGNOSIS — H2511 Age-related nuclear cataract, right eye: Secondary | ICD-10-CM | POA: Insufficient documentation

## 2018-10-26 HISTORY — PX: CATARACT EXTRACTION W/PHACO: SHX586

## 2018-10-26 SURGERY — PHACOEMULSIFICATION, CATARACT, WITH IOL INSERTION
Anesthesia: Monitor Anesthesia Care | Site: Eye | Laterality: Right

## 2018-10-26 MED ORDER — MIDAZOLAM HCL 2 MG/2ML IJ SOLN
INTRAMUSCULAR | Status: AC
  Filled 2018-10-26: qty 2

## 2018-10-26 MED ORDER — MOXIFLOXACIN HCL 0.5 % OP SOLN
OPHTHALMIC | Status: AC
  Filled 2018-10-26: qty 3

## 2018-10-26 MED ORDER — TETRACAINE HCL 0.5 % OP SOLN
OPHTHALMIC | Status: AC
  Administered 2018-10-26: 1 [drp] via OPHTHALMIC
  Filled 2018-10-26: qty 4

## 2018-10-26 MED ORDER — LIDOCAINE HCL (PF) 4 % IJ SOLN
INTRAOCULAR | Status: DC | PRN
  Administered 2018-10-26: 2 mL via OPHTHALMIC

## 2018-10-26 MED ORDER — MIDAZOLAM HCL 2 MG/2ML IJ SOLN
INTRAMUSCULAR | Status: DC | PRN
  Administered 2018-10-26 (×2): 0.5 mg via INTRAVENOUS
  Administered 2018-10-26: 1 mg via INTRAVENOUS

## 2018-10-26 MED ORDER — POVIDONE-IODINE 5 % OP SOLN
OPHTHALMIC | Status: AC
  Filled 2018-10-26: qty 30

## 2018-10-26 MED ORDER — NA CHONDROIT SULF-NA HYALURON 40-17 MG/ML IO SOLN
INTRAOCULAR | Status: DC | PRN
  Administered 2018-10-26: 1 mL via INTRAOCULAR

## 2018-10-26 MED ORDER — LIDOCAINE HCL (PF) 4 % IJ SOLN
INTRAMUSCULAR | Status: AC
  Filled 2018-10-26: qty 5

## 2018-10-26 MED ORDER — SODIUM CHLORIDE 0.9 % IV SOLN
INTRAVENOUS | Status: DC
  Administered 2018-10-26: 08:00:00 via INTRAVENOUS

## 2018-10-26 MED ORDER — ARMC OPHTHALMIC DILATING DROPS
OPHTHALMIC | Status: AC
  Administered 2018-10-26: 1 via OPHTHALMIC
  Filled 2018-10-26: qty 0.5

## 2018-10-26 MED ORDER — EPINEPHRINE PF 1 MG/ML IJ SOLN
INTRAMUSCULAR | Status: AC
  Filled 2018-10-26: qty 1

## 2018-10-26 MED ORDER — MOXIFLOXACIN HCL 0.5 % OP SOLN
OPHTHALMIC | Status: DC | PRN
  Administered 2018-10-26: .2 mL via OPHTHALMIC

## 2018-10-26 MED ORDER — POVIDONE-IODINE 5 % OP SOLN
OPHTHALMIC | Status: DC | PRN
  Administered 2018-10-26: 1 via OPHTHALMIC

## 2018-10-26 MED ORDER — TETRACAINE HCL 0.5 % OP SOLN
1.0000 [drp] | OPHTHALMIC | Status: AC | PRN
  Administered 2018-10-26 (×3): 1 [drp] via OPHTHALMIC

## 2018-10-26 MED ORDER — MOXIFLOXACIN HCL 0.5 % OP SOLN: 1.0000 [drp] | OPHTHALMIC | Status: DC | PRN

## 2018-10-26 MED ORDER — ARMC OPHTHALMIC DILATING DROPS
1.0000 "application " | OPHTHALMIC | Status: AC
  Administered 2018-10-26 (×3): 1 via OPHTHALMIC

## 2018-10-26 MED ORDER — EPINEPHRINE PF 1 MG/ML IJ SOLN
INTRAOCULAR | Status: DC | PRN
  Administered 2018-10-26: 1 mL via OPHTHALMIC

## 2018-10-26 MED ORDER — NA CHONDROIT SULF-NA HYALURON 40-17 MG/ML IO SOLN
INTRAOCULAR | Status: AC
  Filled 2018-10-26: qty 1

## 2018-10-26 SURGICAL SUPPLY — 16 items
GLOVE BIO SURGEON STRL SZ8 (GLOVE) ×3 IMPLANT
GLOVE BIOGEL M 6.5 STRL (GLOVE) ×3 IMPLANT
GLOVE SURG LX 8.0 MICRO (GLOVE) ×2
GLOVE SURG LX STRL 8.0 MICRO (GLOVE) ×1 IMPLANT
GOWN STRL REUS W/ TWL LRG LVL3 (GOWN DISPOSABLE) ×2 IMPLANT
GOWN STRL REUS W/TWL LRG LVL3 (GOWN DISPOSABLE) ×4
LABEL CATARACT MEDS ST (LABEL) ×3 IMPLANT
LENS IOL TECNIS ITEC 22.5 (Intraocular Lens) ×3 IMPLANT
PACK CATARACT (MISCELLANEOUS) ×3 IMPLANT
PACK CATARACT BRASINGTON LX (MISCELLANEOUS) ×3 IMPLANT
PACK EYE AFTER SURG (MISCELLANEOUS) ×3 IMPLANT
SOL BSS BAG (MISCELLANEOUS) ×3
SOLUTION BSS BAG (MISCELLANEOUS) ×1 IMPLANT
SYR 5ML LL (SYRINGE) ×3 IMPLANT
WATER STERILE IRR 250ML POUR (IV SOLUTION) ×3 IMPLANT
WIPE NON LINTING 3.25X3.25 (MISCELLANEOUS) ×3 IMPLANT

## 2018-10-26 NOTE — Anesthesia Preprocedure Evaluation (Signed)
Anesthesia Evaluation  Patient identified by MRN, date of birth, ID band Patient awake    Reviewed: Allergy & Precautions, NPO status , Patient's Chart, lab work & pertinent test results  History of Anesthesia Complications (+) PONV and history of anesthetic complications  Airway Mallampati: III  TM Distance: >3 FB Neck ROM: Full    Dental  (+) Implants   Pulmonary sleep apnea , neg COPD,    breath sounds clear to auscultation- rhonchi (-) wheezing      Cardiovascular hypertension, Pt. on medications (-) CAD, (-) Past MI, (-) Cardiac Stents and (-) CABG + dysrhythmias Atrial Fibrillation  Rhythm:Regular Rate:Normal - Systolic murmurs and - Diastolic murmurs    Neuro/Psych PSYCHIATRIC DISORDERS Anxiety negative neurological ROS     GI/Hepatic Neg liver ROS, GERD  ,  Endo/Other  negative endocrine ROSneg diabetes  Renal/GU negative Renal ROS     Musculoskeletal  (+) Arthritis ,   Abdominal (+) - obese,   Peds  Hematology  (+) anemia ,   Anesthesia Other Findings Past Medical History: No date: Adenomatous colon polyp No date: Allergic rhinitis due to pollen No date: Anemia No date: Anxiety No date: Bronchiectasis (HCC) No date: Complication of anesthesia No date: Degenerative disc disease     Comment:  cervical and lumbar No date: Dysrhythmia No date: GERD (gastroesophageal reflux disease) No date: Hearing loss in right ear No date: Heart murmur No date: HOH (hard of hearing)     Comment:  AIDS No date: Hyperlipidemia No date: Hypertension No date: Osteoarthritis, multiple sites No date: PAF (paroxysmal atrial fibrillation) (HCC)     Comment:  a. 01/2013 Echo ([facility]): EF 60-65%, no rwma,               mildly dil LA/RA, mild AI/TR, trace MR, nl RV size/fxn,               mild PAH; b. CHA2DS2VASc = 4-->Chronic Xarelto. No date: PMR (polymyalgia rheumatica) (HCC) No date: PONV (postoperative  nausea and vomiting) No date: Rheumatic fever No date: Sleep apnea     Comment:  NO CPAP No date: Urge incontinence No date: Vertigo No date: Vertigo   Reproductive/Obstetrics                             Anesthesia Physical Anesthesia Plan  ASA: III  Anesthesia Plan: MAC   Post-op Pain Management:    Induction: Intravenous  PONV Risk Score and Plan: 3 and Midazolam  Airway Management Planned: Natural Airway  Additional Equipment:   Intra-op Plan:   Post-operative Plan:   Informed Consent: I have reviewed the patients History and Physical, chart, labs and discussed the procedure including the risks, benefits and alternatives for the proposed anesthesia with the patient or authorized representative who has indicated his/her understanding and acceptance.     Plan Discussed with: CRNA and Anesthesiologist  Anesthesia Plan Comments:         Anesthesia Quick Evaluation  

## 2018-10-26 NOTE — Anesthesia Postprocedure Evaluation (Signed)
Anesthesia Post Note  Patient: Jennifer Schmitt  Procedure(s) Performed: CATARACT EXTRACTION PHACO AND INTRAOCULAR LENS PLACEMENT ([place]) (Right Eye)  Patient location during evaluation: Short Stay Anesthesia Type: MAC Level of consciousness: awake, awake and alert and oriented Pain management: pain level controlled Vital Signs Assessment: post-procedure vital signs reviewed and stable Respiratory status: spontaneous breathing Cardiovascular status: stable Postop Assessment: no headache and adequate PO intake Anesthetic complications: no     Last Vitals:  Vitals:   10/26/18 0718 10/26/18 0846  BP: (!) 170/63 (!) 101/48  Pulse: 65 (!) 56  Resp: 16 16  Temp: (!) 36.1 C 36.5 C  SpO2: 99% 100%    Last Pain:  Vitals:   10/26/18 0846  TempSrc: Temporal  PainSc: 0-No pain                 Lanora Manis

## 2018-10-26 NOTE — H&P (Signed)
All labs reviewed. Abnormal studies sent to patients PCP when indicated.  Previous H&P reviewed, patient examined, there are NO CHANGES.  Jennifer Leitz Porfilio11/5/20198:20 AM

## 2018-10-26 NOTE — Anesthesia Post-op Follow-up Note (Signed)
Anesthesia QCDR form completed.        

## 2018-10-26 NOTE — Op Note (Signed)
PREOPERATIVE DIAGNOSIS:  Nuclear sclerotic cataract of the right eye.   POSTOPERATIVE DIAGNOSIS:  nuclear sclerotic cataract right eye   OPERATIVE PROCEDURE: Procedure(s): CATARACT EXTRACTION PHACO AND INTRAOCULAR LENS PLACEMENT (IOC)   SURGEON:  Birder Robson, MD.   ANESTHESIA:  Anesthesiologist: Emmie Niemann, MD CRNA: Jonna Clark, CRNA  1.      Managed anesthesia care. 2.      0.45ml of Shugarcaine was instilled in the eye following the paracentesis.   COMPLICATIONS:  None.   TECHNIQUE:   Stop and chop   DESCRIPTION OF PROCEDURE:  The patient was examined and consented in the preoperative holding area where the aforementioned topical anesthesia was applied to the right eye and then brought back to the Operating Room where the right eye was prepped and draped in the usual sterile ophthalmic fashion and a lid speculum was placed. A paracentesis was created with the side port blade and the anterior chamber was filled with viscoelastic. A near clear corneal incision was performed with the steel keratome. A continuous curvilinear capsulorrhexis was performed with a cystotome followed by the capsulorrhexis forceps. Hydrodissection and hydrodelineation were carried out with BSS on a blunt cannula. The lens was removed in a stop and chop  technique and the remaining cortical material was removed with the irrigation-aspiration handpiece. The capsular bag was inflated with viscoelastic and the Technis ZCB00  lens was placed in the capsular bag without complication. The remaining viscoelastic was removed from the eye with the irrigation-aspiration handpiece. The wounds were hydrated. The anterior chamber was flushed with Miostat and the eye was inflated to physiologic pressure. 0.46ml of Vigamox was placed in the anterior chamber. The wounds were found to be water tight. The eye was dressed with Vigamox. The patient was given protective glasses to wear throughout the day and a shield with which  to sleep tonight. The patient was also given drops with which to begin a drop regimen today and will follow-up with me in one day. Implant Name Type Inv. Item Serial No. Manufacturer Lot No. LRB No. Used  LENS IOL DIOP 22.5 - [id number] Intraocular Lens LENS IOL DIOP 22.5 [id number]  Right 1   Procedure(s) with comments: CATARACT EXTRACTION PHACO AND INTRAOCULAR LENS PLACEMENT (IOC) (Right) - [place]  00:47 CDE 7.11 Fluid pack lot # [id number]  Electronically signed: Birder Robson 10/26/2018 8:44 AM

## 2018-10-26 NOTE — Transfer of Care (Signed)
Immediate Anesthesia Transfer of Care Note  Patient: Jennifer Schmitt  Procedure(s) Performed: CATARACT EXTRACTION PHACO AND INTRAOCULAR LENS PLACEMENT ([place]) (Right Eye)  Patient Location: Short Stay  Anesthesia Type:MAC  Level of Consciousness: awake, alert  and oriented  Airway & Oxygen Therapy: Patient Spontanous Breathing  Post-op Assessment: Report given to RN and Post -op Vital signs reviewed and stable  Post vital signs: Reviewed  Last Vitals:  Vitals Value Taken Time  BP 101/48 10/26/2018  8:46 AM  Temp 36.5 C 10/26/2018  8:46 AM  Pulse 56 10/26/2018  8:46 AM  Resp 16 10/26/2018  8:46 AM  SpO2 100 % 10/26/2018  8:46 AM    Last Pain:  Vitals:   10/26/18 0846  TempSrc: Temporal  PainSc: 0-No pain         Complications: No apparent anesthesia complications

## 2018-10-26 NOTE — Discharge Instructions (Addendum)
Eye Surgery Discharge Instructions  Expect mild scratchy sensation or mild soreness. DO NOT RUB YOUR EYE!  The day of surgery:  Minimal physical activity, but bed rest is not required  No reading, computer work, or close hand work  No bending, lifting, or straining.  May watch TV  For 24 hours:  No driving, legal decisions, or alcoholic beverages  Safety precautions  Eat anything you prefer: It is better to start with liquids, then soup then solid foods.  Solar shield eyeglasses should be worn for comfort in the sunlight/patch while sleeping  Resume all regular medications including aspirin or Coumadin if these were discontinued prior to surgery. You may shower, bathe, shave, or wash your hair. Tylenol may be taken for mild discomfort. FOLLOW DR. PORFILIO'S EYE DROP INSTRUCTION SHEET AS REVIEWED.  Call your doctor if you experience significant pain, nausea, or vomiting, fever > 101 or other signs of infection. [phone] or [phone] Specific instructions:  Follow-up Information    Birder Robson, MD Follow up.   Specialty:  Ophthalmology Why:  10/27/18 @ 10:25 am Contact information: [facility] [address] [phone]

## 2018-10-26 NOTE — Anesthesia Procedure Notes (Signed)
Procedure Name: MAC Performed by: Demetrius Charity, CRNA Pre-anesthesia Checklist: Patient identified, Emergency Drugs available, Suction available, Patient being monitored and Timeout performed Oxygen Delivery Method: Nasal cannula

## 2018-11-05 ENCOUNTER — Encounter: Payer: Self-pay | Admitting: Internal Medicine

## 2018-11-05 ENCOUNTER — Ambulatory Visit: Payer: Medicare Other | Admitting: Internal Medicine

## 2018-11-05 VITALS — BP 120/74 | HR 65 | Temp 98.4°F | Ht 66.0 in | Wt 188.0 lb

## 2018-11-05 DIAGNOSIS — Z0000 Encounter for general adult medical examination without abnormal findings: Secondary | ICD-10-CM | POA: Diagnosis not present

## 2018-11-05 DIAGNOSIS — D510 Vitamin B12 deficiency anemia due to intrinsic factor deficiency: Secondary | ICD-10-CM | POA: Diagnosis not present

## 2018-11-05 DIAGNOSIS — F39 Unspecified mood [affective] disorder: Secondary | ICD-10-CM | POA: Diagnosis not present

## 2018-11-05 DIAGNOSIS — I480 Paroxysmal atrial fibrillation: Secondary | ICD-10-CM

## 2018-11-05 DIAGNOSIS — Z7189 Other specified counseling: Secondary | ICD-10-CM

## 2018-11-05 DIAGNOSIS — I10 Essential (primary) hypertension: Secondary | ICD-10-CM

## 2018-11-05 DIAGNOSIS — M353 Polymyalgia rheumatica: Secondary | ICD-10-CM | POA: Diagnosis not present

## 2018-11-05 DIAGNOSIS — E7800 Pure hypercholesterolemia, unspecified: Secondary | ICD-10-CM

## 2018-11-05 LAB — LIPID PANEL
CHOLESTEROL: 153 mg/dL (ref 0–200)
HDL: 63.2 mg/dL (ref >39.00)
LDL Cholesterol: 78 mg/dL (ref 0–99)
NonHDL: 89.71
TRIGLYCERIDES: 59 mg/dL (ref 0.0–149.0)
Total CHOL/HDL Ratio: 2
VLDL: 11.8 mg/dL (ref 0.0–40.0)

## 2018-11-05 LAB — CBC
HEMATOCRIT: 38.2 % (ref 36.0–46.0)
Hemoglobin: 12.8 g/dL (ref 12.0–15.0)
MCHC: 33.4 g/dL (ref 30.0–36.0)
MCV: 97.3 fl (ref 78.0–100.0)
Platelets: 237 10*3/uL (ref 150.0–400.0)
RBC: 3.93 Mil/uL (ref 3.87–5.11)
RDW: 13.7 % (ref 11.5–15.5)
WBC: 4.8 10*3/uL (ref 4.0–10.5)

## 2018-11-05 LAB — COMPREHENSIVE METABOLIC PANEL
ALT: 13 U/L (ref 0–35)
AST: 15 U/L (ref 0–37)
Albumin: 4.2 g/dL (ref 3.5–5.2)
Alkaline Phosphatase: 58 U/L (ref 39–117)
BILIRUBIN TOTAL: 0.4 mg/dL (ref 0.2–1.2)
BUN: 22 mg/dL (ref 6–23)
CALCIUM: 9.2 mg/dL (ref 8.4–10.5)
CHLORIDE: 104 meq/L (ref 96–112)
CO2: 31 meq/L (ref 19–32)
Creatinine, Ser: 0.93 mg/dL (ref 0.40–1.20)
GFR: 61.47 mL/min (ref >60.00)
Glucose, Bld: 93 mg/dL (ref 70–99)
POTASSIUM: 4.9 meq/L (ref 3.5–5.1)
Sodium: 141 mEq/L (ref 135–145)
Total Protein: 7.4 g/dL (ref 6.0–8.3)

## 2018-11-05 LAB — T4, FREE: Free T4: 0.81 ng/dL (ref 0.60–1.60)

## 2018-11-05 LAB — VITAMIN B12: Vitamin B-12: 266 pg/mL (ref 211–911)

## 2018-11-05 MED ORDER — HYDROCORTISONE 2.5 % EX CREA: TOPICAL_CREAM | Freq: Three times a day (TID) | CUTANEOUS | 3 refills | Status: DC | PRN

## 2018-11-05 NOTE — Assessment & Plan Note (Signed)
BP Readings from Last 3 Encounters:  11/05/18 120/74  10/26/18 (!) 101/48  10/05/18 (!) 117/48   Good control Due for labs

## 2018-11-05 NOTE — Assessment & Plan Note (Signed)
Continues on B12

## 2018-11-05 NOTE — Assessment & Plan Note (Signed)
No problems with low dose statin

## 2018-11-05 NOTE — Assessment & Plan Note (Signed)
See social history 

## 2018-11-05 NOTE — Assessment & Plan Note (Signed)
Rare paroxysms On the xarelto and beta blocker

## 2018-11-05 NOTE — Assessment & Plan Note (Addendum)
I have personally reviewed the Medicare Annual Wellness questionnaire and have noted 1. The patient's medical and social history 2. Their use of alcohol, tobacco or illicit drugs 3. Their current medications and supplements 4. The patient's functional ability including ADL's, fall risks, home safety risks and hearing or visual             impairment. 5. Diet and physical activities 6. Evidence for depression or mood disorders  The patients weight, height, BMI and visual acuity have been recorded in the chart I have made referrals, counseling and provided education to the patient based review of the above and I have provided the pt with a written personalized care plan for preventive services.  I have provided you with a copy of your personalized plan for preventive services. Please take the time to review along with your updated medication list.  Yearly flu vaccine Will get second shingrix next month Will get back to exercise No cancer screening due to age Memory loss seems stress related---not pre dementia

## 2018-11-05 NOTE — Assessment & Plan Note (Signed)
Has been quiet Will check ESR

## 2018-11-05 NOTE — Progress Notes (Signed)
Subjective:    Patient ID: Jennifer Schmitt, female    DOB: [birth year], 83 y.o.   MRN: [id number]  HPI Here for Medicare wellness visit and follow up of chronic health conditions Reviewed advanced directives Reviewed other doctors Cataract surgery recently  Still exercises regularly--- on hold after the surgery No hospitalizations Doctors--- Porfilio- eyes, Gollan--cardiology, [place]--derm, [place]--dentist, [facility]--allergy, [facility]--gyn [place]) Rare alcohol No tobacco Vision is great now Hearing is good in left---no hearing on right No falls Independent with instrumental ADLs  Worried about her short term memory Forgot her paperwork---loses glasses, etc Is frustrating Not lost in car. No repeat calls, etc Having trouble adjusting to changes on computer---but still managing No functional loss Still gets B12 shots  Mood has been okay Husband having cancer surgeries--- 5 different skin cancers (Moh's) His memory is poor All the responsibility is on her No depressed and not anhedonic---still enjoys going out, etc Nerves act up occasionally---but doing okay without the alprazolam  Happy with her estrogen Only twice a week  Will rarely have a brief palpitation of atrial fib She can feel No chest pain No SOB Some vertigo---but no other dizziness Continues on the statin--every other day  No significant myalgias Will only have twinges---not even needing tylenol  Current Outpatient Medications on File Prior to Visit  Medication Sig Dispense Refill  . Carboxymethylcell-Hypromellose (GENTEAL OP) Apply to eye.    . Cholecalciferol (VITAMIN D) 2000 units CAPS Take 2,000 Units by mouth daily.    . cyanocobalamin (,VITAMIN B-12,) 1000 MCG/ML injection INJECT 1ML MONTHLY 3 mL 1  . diltiazem (CARDIZEM) 30 MG tablet Take 1 tablet (30 mg total) by mouth 3 (three) times daily as needed. (Patient taking differently: Take 30 mg by mouth 3 (three) times daily as needed (atrial  fibrillation lasting more than 2 hours). ) 30 tablet 6  . Estradiol (IMVEXXY MAINTENANCE PACK) 4 MCG INST Place 1 ampule vaginally 2 (two) times a week. 8 each 11  . folic acid (FOLVITE) 1 MG tablet TAKE 1 TABLET EVERY DAY (Patient taking differently: Take 1 mg by mouth daily. ) 90 tablet 3  . ipratropium (ATROVENT) 0.06 % nasal spray Place 1 spray into both nostrils daily.     Marland Kitchen levocetirizine (XYZAL) 5 MG tablet Take 5 mg by mouth every evening.    . metoprolol succinate (TOPROL-XL) 25 MG 24 hr tablet TAKE 1 TABLET (25 MG TOTAL) BY MOUTH DAILY. (Patient taking differently: Take 25 mg by mouth daily with supper. ) 90 tablet 3  . Probiotic CAPS Take 1 capsule by mouth daily.    . propranolol (INDERAL) 10 MG tablet Take 1 tablet (10 mg total) by mouth 3 (three) times daily as needed. (Patient taking differently: Take 10 mg by mouth 3 (three) times daily as needed (atrial fibrillation lasting more than 2 hours). ) 30 tablet 6  . rosuvastatin (CRESTOR) 5 MG tablet TAKE 1 TABLET (5 MG TOTAL) BY MOUTH DAILY. (Patient taking differently: Take 5 mg by mouth every other day. ) 90 tablet 2  . valsartan-hydrochlorothiazide (DIOVAN-HCT) 80-12.5 MG tablet TAKE 0.5 TABLET EVERY DAY (Patient taking differently: Take 0.5 tablets by mouth daily. ) 45 tablet 3  . XARELTO 20 MG TABS tablet TAKE 1 TABLET BY MOUTH EVERY DAY WITH EVENING MEAL (Patient taking differently: Take 20 mg by mouth daily with supper. ) 90 tablet 1  . PAZEO 0.7 % SOLN Place 1 drop into both eyes at bedtime.  6   No current facility-administered medications  on file prior to visit.     Allergies  Allergen Reactions  . Actonel [Risedronate Sodium] Nausea And Vomiting  . Boniva [Ibandronic Acid] Nausea And Vomiting  . Diclofenac Other (See Comments)    Voltaren Gel aggravated her sinus passages  . Fosamax [Alendronate Sodium] Nausea And Vomiting  . Lipitor [Atorvastatin] Other (See Comments)    Muscle aches. Tolerates Crestor.  Dion Saucier  [Calcitonin ([place])] Other (See Comments)    Sneezing   . Talwin [Pentazocine] Other (See Comments)    Hallucinations   . Latex Rash    Sneezing, watery eyes.  . Niaspan [Niacin Er] Swelling and Rash    Past Medical History:  Diagnosis Date  . Adenomatous colon polyp   . Allergic rhinitis due to pollen   . Anemia   . Anxiety   . Bronchiectasis ([place])   . Complication of anesthesia   . Degenerative disc disease    cervical and lumbar  . Dysrhythmia   . GERD (gastroesophageal reflux disease)   . Hearing loss in right ear   . Heart murmur   . HOH (hard of hearing)    AIDS  . Hyperlipidemia   . Hypertension   . Osteoarthritis, multiple sites   . PAF (paroxysmal atrial fibrillation) ([place])    a. 01/2013 Echo [facility]): EF 60-65%, no rwma, mildly dil LA/RA, mild AI/TR, trace MR, nl RV size/fxn, mild PAH; b. CHA2DS2VASc = 4-->Chronic Xarelto.  Marland Kitchen PMR (polymyalgia rheumatica) ([facility])   . PONV (postoperative nausea and vomiting)   . Rheumatic fever   . Sleep apnea    NO CPAP  . Urge incontinence   . Vertigo   . Vertigo     Past Surgical History:  Procedure Laterality Date  . ABDOMINAL HYSTERECTOMY    . ANKLE FRACTURE SURGERY Left 1996  . BACK SURGERY    . BREAST BIOPSY Left 2010   negative  . CATARACT EXTRACTION W/PHACO Left 10/05/2018   Procedure: CATARACT EXTRACTION PHACO AND INTRAOCULAR LENS PLACEMENT ([place]);  Surgeon: Birder Robson, MD;  Location: [facility] ORS;  Service: Ophthalmology;  Laterality: Left;  [place] 00:40 CDE 6.92 fluid pack lot # [id number]     . CATARACT EXTRACTION W/PHACO Right 10/26/2018   Procedure: CATARACT EXTRACTION PHACO AND INTRAOCULAR LENS PLACEMENT (I[facility]);  Surgeon: Birder Robson, MD;  Location: [facility] ORS;  Service: Ophthalmology;  Laterality: Right;  [place]  00:47 CDE 7.11 Fluid pack lot # [id number]  . DEXA  11/09/06   normal  . FRACTURE SURGERY    . Meningitis  1963  . SPINE SURGERY    . SYNOVECTOMY Right 1973   elbow  . TONSILLECTOMY AND  ADENOIDECTOMY      Family History  Problem Relation Age of Onset  . Schizophrenia Mother        paranoid  . Heart disease Mother        from psych meds  . Arthritis Mother   . Rheum arthritis Mother   . Cancer Father        prostate  . Arthritis Father   . Diabetes Father     Social History   Socioeconomic History  . Marital status: Married    Spouse name: Not on file  . Number of children: 1  . Years of education: Not on file  . Highest education level: Not on file  Occupational History  . Occupation: Scientist, physiological and college professor    Comment: [employer]--PhD in Special education  Social Needs  . Financial resource strain:  Not on file  . Food insecurity:    Worry: Not on file    Inability: Not on file  . Transportation needs:    Medical: Not on file    Non-medical: Not on file  Tobacco Use  . Smoking status: Never Smoker  . Smokeless tobacco: Never Used  Substance and Sexual Activity  . Alcohol use: Yes    Comment: OCCAS  . Drug use: No  . Sexual activity: Not on file  Lifestyle  . Physical activity:    Days per week: Not on file    Minutes per session: Not on file  . Stress: Not on file  Relationships  . Social connections:    Talks on phone: Not on file    Gets together: Not on file    Attends religious service: Not on file    Active member of club or organization: Not on file    Attends meetings of clubs or organizations: Not on file    Relationship status: Not on file  . Intimate partner violence:    Fear of current or ex partner: Not on file    Emotionally abused: Not on file    Physically abused: Not on file    Forced sexual activity: Not on file  Other Topics Concern  . Not on file  Social History Narrative   Retired 2007    1 son in [place] area   Spends part time in [place]      Has living will   Husband then son is health care POA   Not sure about DNR---will keep open resuscitation for now    No tube feedings if cognitively  unaware               Review of Systems Appetite is fine.  Weight is stable Sleep is variable--some trouble recently (things on her mind) Teeth okay--keeps up with dentist. Using retainer now Wears seat belt Bowels are slow at times. Early hemorrhoid No heartburn or dysphagia No suspicious skin lesions now Continues on B12 shots    Objective:   Physical Exam  Constitutional: She is oriented to person, place, and time. She appears well-developed.  HENT:  Mouth/Throat: Oropharynx is clear and moist. No oropharyngeal exudate.  Neck: No thyromegaly present.  Cardiovascular: Normal rate, regular rhythm, normal heart sounds and intact distal pulses. Exam reveals no gallop.  No murmur heard. Respiratory: Effort normal and breath sounds normal. No respiratory distress. She has no wheezes. She has no rales.  GI: Soft. There is no tenderness.  Musculoskeletal: She exhibits no edema or tenderness.  Lymphadenopathy:    She has no cervical adenopathy.  Neurological: She is alert and oriented to person, place, and time.  President-- "Daisy Floro, Barack Obama, Bill Clinton---Bush" [phone]- not good with numbers D-l-r-o-w Recall 3/3  Skin: No rash noted. No erythema.  Psychiatric: She has a normal mood and affect. Her behavior is normal.           Assessment & Plan:

## 2018-11-05 NOTE — Assessment & Plan Note (Signed)
Mostly stress related Doing okay without meds

## 2018-11-11 ENCOUNTER — Other Ambulatory Visit: Payer: Self-pay | Admitting: Cardiovascular Disease

## 2018-11-11 NOTE — Telephone Encounter (Signed)
°*  STAT* If patient is at the pharmacy, call can be transferred to refill team.   1. Which medications need to be refilled? (please list name of each medication and dose if known) xarelto   2. Which pharmacy/location (including street and city if local pharmacy) is medication to be sent to? [facility] on [address]   3. Do they need a 30 day or 90 day supply? 90 day

## 2018-11-11 NOTE — Telephone Encounter (Signed)
Please review for refill.  

## 2018-11-12 MED ORDER — RIVAROXABAN 20 MG PO TABS: ORAL_TABLET | ORAL | 1 refills | Status: DC

## 2018-11-12 NOTE — Telephone Encounter (Signed)
CrCl > 50, appropriate for refill, sent in.

## 2018-11-17 ENCOUNTER — Telehealth: Payer: Self-pay | Admitting: Cardiovascular Disease

## 2018-11-17 ENCOUNTER — Other Ambulatory Visit: Payer: Self-pay | Admitting: *Deleted

## 2018-11-17 MED ORDER — METOPROLOL SUCCINATE ER 25 MG PO TB24: ORAL_TABLET | ORAL | 3 refills | Status: DC

## 2018-11-17 MED ORDER — ROSUVASTATIN CALCIUM 5 MG PO TABS: 5.0000 mg | ORAL_TABLET | Freq: Every day | ORAL | 3 refills | Status: DC

## 2018-11-17 NOTE — Telephone Encounter (Signed)
Requested Prescriptions   Signed Prescriptions Disp Refills  . rosuvastatin (CRESTOR) 5 MG tablet 90 tablet 3    Sig: Take 1 tablet (5 mg total) by mouth daily.    Authorizing Provider: Minna Merritts    Ordering User: Eugenio Hoes, MARINA C  . metoprolol succinate (TOPROL-XL) 25 MG 24 hr tablet 90 tablet 3    Sig: TAKE 1 TABLET (25 MG TOTAL) BY MOUTH DAILY.    Authorizing Provider: Minna Merritts    Ordering User: Britt Bottom

## 2018-11-17 NOTE — Telephone Encounter (Signed)
°*  STAT* If patient is at the pharmacy, call can be transferred to refill team.   1. Which medications need to be refilled? (please list name of each medication and dose if known)  Metoprolol succinate 25 MG - 1 tablet daily  Rosuvastatin (CRETOR) 5 MG - 1 tablet every other day  2. Which pharmacy/location (including street and city if local pharmacy) is medication to be sent to? [facility] on [address]   3. Do they need a 30 day or 90 day supply? 90 day

## 2018-11-17 NOTE — Telephone Encounter (Signed)
Requested Prescriptions   Signed Prescriptions Disp Refills  . rosuvastatin (CRESTOR) 5 MG tablet 90 tablet 3    Sig: Take 1 tablet (5 mg total) by mouth daily.    Authorizing Provider: Minna Merritts    Ordering User: Eugenio Hoes, Ferdinand Revoir C  . metoprolol succinate (TOPROL-XL) 25 MG 24 hr tablet 90 tablet 3    Sig: TAKE 1 TABLET (25 MG TOTAL) BY MOUTH DAILY.    Authorizing Provider: Minna Merritts    Ordering User: Britt Bottom

## 2018-11-25 ENCOUNTER — Ambulatory Visit (INDEPENDENT_AMBULATORY_CARE_PROVIDER_SITE_OTHER): Payer: Medicare Other | Admitting: Obstetrics & Gynecology

## 2018-11-25 ENCOUNTER — Encounter: Payer: Self-pay | Admitting: Obstetrics & Gynecology

## 2018-11-25 VITALS — BP 140/80 | Ht 66.0 in | Wt 180.0 lb

## 2018-11-25 DIAGNOSIS — N3281 Overactive bladder: Secondary | ICD-10-CM | POA: Diagnosis not present

## 2018-11-25 DIAGNOSIS — Z0000 Encounter for general adult medical examination without abnormal findings: Secondary | ICD-10-CM

## 2018-11-25 DIAGNOSIS — N952 Postmenopausal atrophic vaginitis: Secondary | ICD-10-CM

## 2018-11-25 MED ORDER — ESTRADIOL 4 MCG VA INST: 1.0000 | VAGINAL_INSERT | VAGINAL | 11 refills | Status: DC

## 2018-11-25 NOTE — Patient Instructions (Signed)
Estradiol vaginal tablets What is this medicine? ESTRADIOL (es tra DYE ole) vaginal tablet is used to help relieve symptoms of vaginal irritation and dryness that occurs in some women during menopause. This medicine may be used for other purposes; ask your health care provider or pharmacist if you have questions. COMMON BRAND NAME(S): Vagifem, Yuvafem What should I tell my health care provider before I take this medicine? They need to know if you have any of these conditions: -abnormal vaginal bleeding -blood vessel disease or blood clots -breast, cervical, endometrial, ovarian, liver, or uterine cancer -dementia -diabetes -gallbladder disease -heart disease or recent heart attack -high blood pressure -high cholesterol -high level of calcium in the blood -hysterectomy -kidney disease -liver disease -migraine headaches -protein C deficiency -protein S deficiency -stroke -systemic lupus erythematosus (SLE) -tobacco smoker -an unusual or allergic reaction to estrogens, other hormones, medicines, foods, dyes, or preservatives -pregnant or trying to get pregnant -breast-feeding How should I use this medicine? This medicine is only for use in the vagina. Do not take by mouth. Wash and dry your hands before and after use. Read package directions carefully. Unwrap the applicator package. Be sure to use a new applicator for each dose. Use at the same time each day. If the tablet has fallen out of the applicator, but is still in the package, carefully place it back into the applicator. If the tablet has fallen out of the package, that applicator should be thrown out and you should use a new applicator containing a new tablet. Lie on your back, part and bend your knees. Gently insert the applicator as far as comfortably possible into the vagina. Then, gently press the plunger until the plunger is fully depressed. This will release the tablet into the vagina. Gently remove the applicator. Throw  away the applicator after use. Do not use your medicine more often than directed. Do not stop using except on the advice of your doctor or health care professional. Talk to your pediatrician regarding the use of this medicine in children. This medicine is not approved for use in children. A patient package insert for the product will be given with each prescription and refill. Read this sheet carefully each time. The sheet may change frequently. Overdosage: If you think you have taken too much of this medicine contact a poison control center or emergency room at once. NOTE: This medicine is only for you. Do not share this medicine with others. What if I miss a dose? If you miss a dose, take it as soon as you can. If it is almost time for your next dose, take only that dose. Do not take double or extra doses. What may interact with this medicine? Do not take this medicine with any of the following medications: -aromatase inhibitors like aminoglutethimide, anastrozole, exemestane, letrozole, testolactone This medicine may also interact with the following medications: -antibiotics used to treat tuberculosis like rifabutin, rifampin and rifapentene -raloxifene or tamoxifen -warfarin This list may not describe all possible interactions. Give your health care provider a list of all the medicines, herbs, non-prescription drugs, or dietary supplements you use. Also tell them if you smoke, drink alcohol, or use illegal drugs. Some items may interact with your medicine. What should I watch for while using this medicine? Visit your health care professional for regular checks on your progress. You will need a regular breast and pelvic exam. You should also discuss the need for regular mammograms with your health care professional, and follow his or her  guidelines. This medicine can make your body retain fluid, making your fingers, hands, or ankles swell. Your blood pressure can go up. Contact your doctor or  health care professional if you feel you are retaining fluid. If you have any reason to think you are pregnant; stop taking this medicine at once and contact your doctor or health care professional. Tobacco smoking increases the risk of getting a blood clot or having a stroke, especially if you are more than 81 years old. You are strongly advised not to smoke. If you wear contact lenses and notice visual changes, or if the lenses begin to feel uncomfortable, consult your eye care specialist. If you are going to have elective surgery, you may need to stop taking this medicine beforehand. Consult your health care professional for advice prior to scheduling the surgery. What side effects may I notice from receiving this medicine? Side effects that you should report to your doctor or health care professional as soon as possible: -allergic reactions like skin rash, itching or hives, swelling of the face, lips, or tongue -breast tissue changes or discharge -changes in vision -chest pain -confusion, trouble speaking or understanding -dark urine -general ill feeling or flu-like symptoms -light-colored stools -nausea, vomiting -pain, swelling, warmth in the leg -right upper belly pain -severe headaches -shortness of breath -sudden numbness or weakness of the face, arm or leg -trouble walking, dizziness, loss of balance or coordination -unusual vaginal bleeding -yellowing of the eyes or skin Side effects that usually do not require medical attention (report to your doctor or health care professional if they continue or are bothersome): -hair loss -increased hunger or thirst -increased urination -symptoms of vaginal infection like itching, irritation or unusual discharge -unusually weak or tired This list may not describe all possible side effects. Call your doctor for medical advice about side effects. You may report side effects to FDA at 1-800-FDA-1088. Where should I keep my medicine? Keep  out of the reach of children. Store at room temperature between 15 and 30 degrees C (59 and 86 degrees F). Throw away any unused medicine after the expiration date. NOTE: This sheet is a summary. It may not cover all possible information. If you have questions about this medicine, talk to your doctor, pharmacist, or health care provider.  2018 Elsevier/Gold Standard (2014-11-22 09:22:51)

## 2018-11-25 NOTE — Progress Notes (Signed)
HPI:      [name] is a 83 y.o. G1P1001 who LMP was in the past (TAH BSO), she presents today for her examination related to bladder and vaginal symptoms.  The patient is not currently sexually active. Herlast pap: approximate date 2018 and was normal and last mammogram: approximate date 2015 and was normal.  The patient does perform self breast exams.  There is no notable family history of breast or ovarian cancer in her family. The patient is taking hormone replacement therapy. Patient denies post-menopausal vaginal bleeding.   The patient has regular exercise: yes. The patient denies current symptoms of depression.    PMHx: Past Medical History:  Diagnosis Date  . Adenomatous colon polyp   . Allergic rhinitis due to pollen   . Anemia   . Anxiety   . Bronchiectasis ([place])   . Complication of anesthesia   . Degenerative disc disease    cervical and lumbar  . Dysrhythmia   . GERD (gastroesophageal reflux disease)   . Hearing loss in right ear   . Heart murmur   . HOH (hard of hearing)    AIDS  . Hyperlipidemia   . Hypertension   . Osteoarthritis, multiple sites   . PAF (paroxysmal atrial fibrillation) ([place])    a. 01/2013 Echo [facility]): EF 60-65%, no rwma, mildly dil LA/RA, mild AI/TR, trace MR, nl RV size/fxn, mild PAH; b. CHA2DS2VASc = 4-->Chronic Xarelto.  Marland Kitchen PMR (polymyalgia rheumatica) (HCC)   . PONV (postoperative nausea and vomiting)   . Rheumatic fever   . Sleep apnea    NO CPAP  . Urge incontinence   . Vertigo   . Vertigo    Past Surgical History:  Procedure Laterality Date  . ABDOMINAL HYSTERECTOMY    . ANKLE FRACTURE SURGERY Left 1996  . BACK SURGERY    . BREAST BIOPSY Left 2010   negative  . CATARACT EXTRACTION W/PHACO Left 10/05/2018   Procedure: CATARACT EXTRACTION PHACO AND INTRAOCULAR LENS PLACEMENT ([place]);  Surgeon: Birder Robson, MD;  Location: [facility] ORS;  Service: Ophthalmology;  Laterality: Left;  [place] 00:40 CDE 6.92 fluid pack lot #  [id number]     . CATARACT EXTRACTION W/PHACO Right 10/26/2018   Procedure: CATARACT EXTRACTION PHACO AND INTRAOCULAR LENS PLACEMENT ([facility]);  Surgeon: Birder Robson, MD;  Location: [facility] ORS;  Service: Ophthalmology;  Laterality: Right;  [place]  00:47 CDE 7.11 Fluid pack lot # [id number]  . DEXA  11/09/06   normal  . FRACTURE SURGERY    . Meningitis  1963  . SPINE SURGERY    . SYNOVECTOMY Right 1973   elbow  . TONSILLECTOMY AND ADENOIDECTOMY     Family History  Problem Relation Age of Onset  . Schizophrenia Mother        paranoid  . Heart disease Mother        from psych meds  . Arthritis Mother   . Rheum arthritis Mother   . Cancer Father        prostate  . Arthritis Father   . Diabetes Father    Social History   Tobacco Use  . Smoking status: Never Smoker  . Smokeless tobacco: Never Used  Substance Use Topics  . Alcohol use: Yes    Comment: OCCAS  . Drug use: No    Current Outpatient Medications:  .  Carboxymethylcell-Hypromellose (GENTEAL OP), Apply to eye., Disp: , Rfl:  .  Cholecalciferol (VITAMIN D) 2000 units CAPS, Take 2,000 Units by mouth daily.,  Disp: , Rfl:  .  cyanocobalamin (,VITAMIN B-12,) 1000 MCG/ML injection, INJECT 1ML MONTHLY, Disp: 3 mL, Rfl: 1 .  diltiazem (CARDIZEM) 30 MG tablet, Take 1 tablet (30 mg total) by mouth 3 (three) times daily as needed. (Patient taking differently: Take 30 mg by mouth 3 (three) times daily as needed (atrial fibrillation lasting more than 2 hours). ), Disp: 30 tablet, Rfl: 6 .  Estradiol (IMVEXXY MAINTENANCE PACK) 4 MCG INST, Place 1 ampule vaginally 2 (two) times a week., Disp: 8 each, Rfl: 11 .  folic acid (FOLVITE) 1 MG tablet, TAKE 1 TABLET EVERY DAY (Patient taking differently: Take 1 mg by mouth daily. ), Disp: 90 tablet, Rfl: 3 .  hydrocortisone 2.5 % cream, Apply topically 3 (three) times daily as needed., Disp: 28 g, Rfl: 3 .  ipratropium (ATROVENT) 0.06 % nasal spray, Place 1 spray into both nostrils daily. , Disp: ,  Rfl:  .  levocetirizine (XYZAL) 5 MG tablet, Take 5 mg by mouth every evening., Disp: , Rfl:  .  metoprolol succinate (TOPROL-XL) 25 MG 24 hr tablet, TAKE 1 TABLET (25 MG TOTAL) BY MOUTH DAILY., Disp: 90 tablet, Rfl: 3 .  PAZEO 0.7 % SOLN, Place 1 drop into both eyes at bedtime., Disp: , Rfl: 6 .  Probiotic CAPS, Take 1 capsule by mouth daily., Disp: , Rfl:  .  propranolol (INDERAL) 10 MG tablet, Take 1 tablet (10 mg total) by mouth 3 (three) times daily as needed. (Patient taking differently: Take 10 mg by mouth 3 (three) times daily as needed (atrial fibrillation lasting more than 2 hours). ), Disp: 30 tablet, Rfl: 6 .  rivaroxaban (XARELTO) 20 MG TABS tablet, TAKE 1 TABLET BY MOUTH EVERY DAY WITH EVENING MEAL, Disp: 90 tablet, Rfl: 1 .  rosuvastatin (CRESTOR) 5 MG tablet, Take 1 tablet (5 mg total) by mouth daily., Disp: 90 tablet, Rfl: 3 .  valsartan-hydrochlorothiazide (DIOVAN-HCT) 80-12.5 MG tablet, TAKE 0.5 TABLET EVERY DAY (Patient taking differently: Take 0.5 tablets by mouth daily. ), Disp: 45 tablet, Rfl: 3 Allergies: Actonel [risedronate sodium]; Boniva [ibandronic acid]; Diclofenac; Fosamax [alendronate sodium]; Lipitor [atorvastatin]; Miacalcin [calcitonin (salmon)]; Talwin [pentazocine]; Latex; and Niaspan [niacin er]  Review of Systems  Constitutional: Negative for chills, fever and malaise/fatigue.  HENT: Negative for congestion, sinus pain and sore throat.   Eyes: Negative for blurred vision and pain.  Respiratory: Negative for cough and wheezing.   Cardiovascular: Negative for chest pain and leg swelling.  Gastrointestinal: Negative for abdominal pain, constipation, diarrhea, heartburn, nausea and vomiting.  Genitourinary: Negative for dysuria, frequency, hematuria and urgency.  Musculoskeletal: Negative for back pain, joint pain, myalgias and neck pain.  Skin: Negative for itching and rash.  Neurological: Negative for dizziness, tremors and weakness.  Endo/Heme/Allergies:  Does not bruise/bleed easily.  Psychiatric/Behavioral: Negative for depression. The patient is not nervous/anxious and does not have insomnia.     Objective: BP 140/80   Ht 5\' 6"  (1.676 m)   Wt 180 lb (81.6 kg)   BMI 29.05 kg/m   Filed Weights   11/25/18 1052  Weight: 180 lb (81.6 kg)   Body mass index is 29.05 kg/m. Physical Exam  Constitutional: She is oriented to person, place, and time. She appears well-developed and well-nourished. No distress.  Genitourinary: Rectum normal and vagina normal. Pelvic exam was performed with patient supine. There is no rash or lesion on the right labia. There is no rash or lesion on the left labia. Vagina exhibits no lesion. No bleeding  in the vagina. Right adnexum does not display mass and does not display tenderness. Left adnexum does not display mass and does not display tenderness.  Genitourinary Comments: Absent Uterus Absent cervix Vaginal cuff well healed, atrophy present Sling intact no erosions, distal cystocele towards apex  HENT:  Head: Normocephalic and atraumatic. Head is without laceration.  Right Ear: Hearing normal.  Left Ear: Hearing normal.  Nose: No epistaxis.  No foreign bodies.  Mouth/Throat: Uvula is midline, oropharynx is clear and moist and mucous membranes are normal.  Eyes: Pupils are equal, round, and reactive to light.  Neck: Normal range of motion. Neck supple. No thyromegaly present.  Cardiovascular: Normal rate and regular rhythm. Exam reveals no gallop and no friction rub.  No murmur heard. Pulmonary/Chest: Effort normal and breath sounds normal. No respiratory distress. She has no wheezes. Right breast exhibits no mass, no skin change and no tenderness. Left breast exhibits no mass, no skin change and no tenderness.  Abdominal: Soft. Bowel sounds are normal. She exhibits no distension. There is no tenderness. There is no rebound.  Musculoskeletal: Normal range of motion.  Neurological: She is alert and oriented  to person, place, and time. No cranial nerve deficit.  Skin: Skin is warm and dry.  Psychiatric: She has a normal mood and affect. Judgment normal.  Vitals reviewed.  Assessment:  1. Vaginal atrophy   2. Overactive bladder   3. Annual physical exam    Plan:            1.  Cervical Screening-  Pap smear schedule reviewed with patient  2. Breast screening- Exam annually; mammogram declined  3. Colonoscopy declined  4. Labs managed by PCP  5. Counseling for hormonal therapy: no change in therapy today Prefers to continue Imvexxy 4 mg twice weekly therapy for vag atrophy     F/U  No follow-ups on file.  Barnett Applebaum, MD, [facility], [facility] 11/25/2018  11:04 AM

## 2018-11-26 ENCOUNTER — Other Ambulatory Visit: Payer: Self-pay

## 2018-11-26 MED ORDER — VALSARTAN-HYDROCHLOROTHIAZIDE 80-12.5 MG PO TABS: 0.5000 | ORAL_TABLET | Freq: Every day | ORAL | 3 refills | Status: DC

## 2018-11-30 ENCOUNTER — Encounter: Payer: Self-pay | Admitting: Family Medicine

## 2018-11-30 ENCOUNTER — Ambulatory Visit: Payer: Medicare Other | Admitting: Family Medicine

## 2018-11-30 VITALS — BP 108/68 | HR 64 | Temp 98.6°F | Ht 66.0 in | Wt 187.0 lb

## 2018-11-30 DIAGNOSIS — J069 Acute upper respiratory infection, unspecified: Secondary | ICD-10-CM | POA: Diagnosis not present

## 2018-11-30 NOTE — Progress Notes (Signed)
   Subjective:     Jennifer Schmitt is a 83 y.o. female presenting for head congestion (headache, runny nose, cough, symptoms started Friday afternoon)     URI   This is a new problem. The current episode started in the past 7 days. The problem has been unchanged. There has been no fever. Associated symptoms include congestion, coughing, headaches, nausea, a plugged ear sensation, rhinorrhea and sinus pain. Pertinent negatives include no abdominal pain, chest pain, ear pain, sore throat, swollen glands, vomiting or wheezing. She has tried increased fluids and sleep (airborne, lemon tea) for the symptoms. The treatment provided no relief.    Did get a flu shot this year No know sick contact - but lives in [facility] No dental pain Using Atrovent   Review of Systems  HENT: Positive for congestion, rhinorrhea and sinus pain. Negative for ear pain and sore throat.   Respiratory: Positive for cough. Negative for wheezing.   Cardiovascular: Negative for chest pain.  Gastrointestinal: Positive for nausea. Negative for abdominal pain and vomiting.  Musculoskeletal: Positive for arthralgias and myalgias.  Neurological: Positive for headaches.     Social History   Tobacco Use  Smoking Status Never Smoker  Smokeless Tobacco Never Used        Objective:    BP Readings from Last 3 Encounters:  11/30/18 108/68  11/25/18 140/80  11/05/18 120/74   Wt Readings from Last 3 Encounters:  11/30/18 187 lb (84.8 kg)  11/25/18 180 lb (81.6 kg)  11/05/18 188 lb (85.3 kg)    BP 108/68   Pulse 64   Temp 98.6 F (37 C) (Oral)   Ht 5\' 6"  (1.676 m)   Wt 187 lb (84.8 kg)   SpO2 96%   BMI 30.18 kg/m    Physical Exam  Constitutional: She appears well-developed and well-nourished. No distress.  HENT:  Head: Normocephalic and atraumatic.  Right Ear: Tympanic membrane and ear canal normal.  Left Ear: Tympanic membrane and ear canal normal.  Nose: Mucosal edema and rhinorrhea present.  Right sinus exhibits no maxillary sinus tenderness and no frontal sinus tenderness. Left sinus exhibits maxillary sinus tenderness. Left sinus exhibits no frontal sinus tenderness.  Mouth/Throat: Uvula is midline and mucous membranes are normal. Posterior oropharyngeal erythema present. No oropharyngeal exudate. Tonsils are 0 on the right. Tonsils are 0 on the left.  Eyes: Conjunctivae and EOM are normal. No scleral icterus.  Neck: Neck supple.  Cardiovascular: Normal rate, regular rhythm and normal heart sounds.  No murmur heard. Pulmonary/Chest: Effort normal and breath sounds normal. No respiratory distress.  Lymphadenopathy:    She has no cervical adenopathy.  Neurological: She is alert.  Skin: Skin is warm and dry. Capillary refill takes less than 2 seconds. She is not diaphoretic.  Psychiatric: She has a normal mood and affect.          Assessment & Plan:   Problem List Items Addressed This Visit    None    Visit Diagnoses    Upper respiratory tract infection, unspecified type    -  Primary     Overall well appearing on exam. Low suspicion for flu Symptomatic care OK to increase atrovent during illness If not improving in the next few days would plan for Augmentin for sinusitis  Return if symptoms worsen or fail to improve.  Lesleigh Noe, MD

## 2018-11-30 NOTE — Patient Instructions (Addendum)
Based on your symptoms, it looks like you have a virus.   Antibiotics are not need for a viral infection but the following will help:   1. Drink plenty of fluids 2. Get lots of rest  Sinus Congestion 1) Start Saline Spray 2) Continue Atrovent - but do 2 sprays in each nostril 2-3 times a day as tolerated 3) Over the counter congestion medications - Regular Mucinex  Cough 1) Cough drops can be helpful 2) Nyquil (or nighttime cough medication) 3) Honey is proven to be one of the best cough medications   Sore Throat 1) Honey as above, cough drops 2) Ibuprofen or Aleve can be helpful 3) Salt water Gargles  If you develop fevers (Temperature >100.4), chills, worsening symptoms or symptoms lasting longer than 10 days return to clinic.

## 2018-12-01 ENCOUNTER — Encounter: Payer: Self-pay | Admitting: Obstetrics and Gynecology

## 2018-12-31 ENCOUNTER — Encounter: Payer: Self-pay | Admitting: Obstetrics & Gynecology

## 2018-12-31 ENCOUNTER — Other Ambulatory Visit: Payer: Self-pay | Admitting: Obstetrics & Gynecology

## 2018-12-31 MED ORDER — ESTRADIOL 4 MCG VA INST: 1.0000 | VAGINAL_INSERT | VAGINAL | 11 refills | Status: DC

## 2019-06-01 ENCOUNTER — Other Ambulatory Visit: Payer: Self-pay | Admitting: Internal Medicine

## 2019-07-25 ENCOUNTER — Telehealth: Payer: Self-pay | Admitting: Internal Medicine

## 2019-07-25 NOTE — Telephone Encounter (Signed)
She needs to have a functional deficit for PT to be covered by insurance. Has she spoken to Legrand Como, the Personnel officer, to see what she can do during the [place] restrictions? I don't think she is going to qualify for PT

## 2019-07-25 NOTE — Telephone Encounter (Signed)
Probably need to if she thinks she needs PT

## 2019-07-25 NOTE — Telephone Encounter (Signed)
Best number [phone]  Pt called stating she is missing the fitness center walking track @ [facility]  She wanted to know if you could give her an order for PT@ [facility] to help her get her strength back and to maintain

## 2019-07-25 NOTE — Telephone Encounter (Signed)
Spoke to patient. She said she has knee pain, arm pain, and issues with stability from her ears. Not sure if that would qualify functional deficits.   Should we bring her in for an evaluation?

## 2019-07-26 NOTE — Telephone Encounter (Signed)
Spoke to pt. Made an appt for 07-29-19

## 2019-07-29 ENCOUNTER — Ambulatory Visit (INDEPENDENT_AMBULATORY_CARE_PROVIDER_SITE_OTHER): Payer: Medicare Other | Admitting: Internal Medicine

## 2019-07-29 ENCOUNTER — Other Ambulatory Visit: Payer: Self-pay

## 2019-07-29 ENCOUNTER — Encounter: Payer: Self-pay | Admitting: Internal Medicine

## 2019-07-29 DIAGNOSIS — Z7409 Other reduced mobility: Secondary | ICD-10-CM | POA: Insufficient documentation

## 2019-07-29 NOTE — Assessment & Plan Note (Signed)
Vestibular issues from Schwannoma  Now with increased weakness due to lack of her usual routine Discussed exercise alternatives--like [place] machines [facility] [employer] Will go ahead with PT referral to [facility])

## 2019-07-29 NOTE — Progress Notes (Signed)
Subjective:    Patient ID: Jennifer Schmitt, female    DOB: [birth year], 84 y.o.   MRN: [id number]  HPI Here due to concerns about lack of strength Not able to use gym for exercise due to COVID Used the nu-step to avoid weight bearing---and various weight machines, etc Did discuss issues with Michael--the fitness director Unable to do the virtual group exercise or walking outside Trouble doing home exercise---balance issues  More trouble walking--limp and not as strong Can't vacuum now Balance is worse--needs cart in store just for stability  Current Outpatient Medications on File Prior to Visit  Medication Sig Dispense Refill  . Cholecalciferol (VITAMIN D) 2000 units CAPS Take 2,000 Units by mouth daily.    . cyanocobalamin (,VITAMIN B-12,) 1000 MCG/ML injection INJECT 1ML MONTHLY 3 mL 1  . diltiazem (CARDIZEM) 30 MG tablet Take 1 tablet (30 mg total) by mouth 3 (three) times daily as needed. (Patient taking differently: Take 30 mg by mouth 3 (three) times daily as needed (atrial fibrillation lasting more than 2 hours). ) 30 tablet 6  . Estradiol (IMVEXXY MAINTENANCE PACK) 4 MCG INST Place 1 ampule vaginally 2 (two) times a week. 8 each 11  . folic acid (FOLVITE) 1 MG tablet TAKE 1 TABLET BY MOUTH EVERY DAY 90 tablet 3  . hydrocortisone 2.5 % cream Apply topically 3 (three) times daily as needed. 28 g 3  . ipratropium (ATROVENT) 0.06 % nasal spray Place 1 spray into both nostrils daily.     . metoprolol succinate (TOPROL-XL) 25 MG 24 hr tablet TAKE 1 TABLET (25 MG TOTAL) BY MOUTH DAILY. 90 tablet 3  . PAZEO 0.7 % SOLN Place 1 drop into both eyes at bedtime.  6  . Probiotic CAPS Take 1 capsule by mouth daily.    . propranolol (INDERAL) 10 MG tablet Take 1 tablet (10 mg total) by mouth 3 (three) times daily as needed. (Patient taking differently: Take 10 mg by mouth 3 (three) times daily as needed (atrial fibrillation lasting more than 2 hours). ) 30 tablet 6  . rivaroxaban (XARELTO) 20  MG TABS tablet TAKE 1 TABLET BY MOUTH EVERY DAY WITH EVENING MEAL 90 tablet 1  . rosuvastatin (CRESTOR) 5 MG tablet Take 1 tablet (5 mg total) by mouth daily. 90 tablet 3  . valsartan-hydrochlorothiazide (DIOVAN-HCT) 80-12.5 MG tablet Take 0.5 tablets by mouth daily. 45 tablet 3   No current facility-administered medications on file prior to visit.     Allergies  Allergen Reactions  . Actonel [Risedronate Sodium] Nausea And Vomiting  . Boniva [Ibandronic Acid] Nausea And Vomiting  . Diclofenac Other (See Comments)    Voltaren Gel aggravated her sinus passages  . Fosamax [Alendronate Sodium] Nausea And Vomiting  . Lipitor [Atorvastatin] Other (See Comments)    Muscle aches. Tolerates Crestor.  Dion Saucier [Calcitonin ([place])] Other (See Comments)    Sneezing   . Talwin [Pentazocine] Other (See Comments)    Hallucinations   . Latex Rash    Sneezing, watery eyes.  . Niaspan [Niacin Er] Swelling and Rash    Past Medical History:  Diagnosis Date  . Adenomatous colon polyp   . Allergic rhinitis due to pollen   . Anemia   . Anxiety   . Bronchiectasis ([place])   . Complication of anesthesia   . Degenerative disc disease    cervical and lumbar  . Dysrhythmia   . GERD (gastroesophageal reflux disease)   . Hearing loss in right ear   .  Heart murmur   . HOH (hard of hearing)    AIDS  . Hyperlipidemia   . Hypertension   . Osteoarthritis, multiple sites   . PAF (paroxysmal atrial fibrillation) ([place])    a. 01/2013 Echo [facility]): EF 60-65%, no rwma, mildly dil LA/RA, mild AI/TR, trace MR, nl RV size/fxn, mild PAH; b. CHA2DS2VASc = 4-->Chronic Xarelto.  Marland Kitchen PMR (polymyalgia rheumatica) ([facility])   . PONV (postoperative nausea and vomiting)   . Rheumatic fever   . Sleep apnea    NO CPAP  . Urge incontinence   . Vertigo   . Vertigo     Past Surgical History:  Procedure Laterality Date  . ABDOMINAL HYSTERECTOMY    . ANKLE FRACTURE SURGERY Left 1996  . BACK SURGERY    .  BREAST BIOPSY Left 2010   negative  . CATARACT EXTRACTION W/PHACO Left 10/05/2018   Procedure: CATARACT EXTRACTION PHACO AND INTRAOCULAR LENS PLACEMENT ([place]);  Surgeon: Birder Robson, MD;  Location: [facility] ORS;  Service: Ophthalmology;  Laterality: Left;  [place] 00:40 CDE 6.92 fluid pack lot # [id number]     . CATARACT EXTRACTION W/PHACO Right 10/26/2018   Procedure: CATARACT EXTRACTION PHACO AND INTRAOCULAR LENS PLACEMENT ([facility]);  Surgeon: Birder Robson, MD;  Location: [facility] ORS;  Service: Ophthalmology;  Laterality: Right;  [place]  00:47 CDE 7.11 Fluid pack lot # [id number]  . DEXA  11/09/06   normal  . FRACTURE SURGERY    . Meningitis  1963  . SPINE SURGERY    . SYNOVECTOMY Right 1973   elbow  . TONSILLECTOMY AND ADENOIDECTOMY      Family History  Problem Relation Age of Onset  . Schizophrenia Mother        paranoid  . Heart disease Mother        from psych meds  . Arthritis Mother   . Rheum arthritis Mother   . Cancer Father        prostate  . Arthritis Father   . Diabetes Father   . Ovarian cancer Maternal Grandmother   . Uterine cancer Maternal Grandmother   . Colon cancer Maternal Grandmother   . Breast cancer Paternal Aunt 52    Social History   Socioeconomic History  . Marital status: Married    Spouse name: Not on file  . Number of children: 1  . Years of education: Not on file  . Highest education level: Not on file  Occupational History  . Occupation: Scientist, physiological and college professor    Comment: [employer]--PhD in Special education  Social Needs  . Financial resource strain: Not on file  . Food insecurity    Worry: Not on file    Inability: Not on file  . Transportation needs    Medical: Not on file    Non-medical: Not on file  Tobacco Use  . Smoking status: Never Smoker  . Smokeless tobacco: Never Used  Substance and Sexual Activity  . Alcohol use: Yes    Comment: OCCAS  . Drug use: No  . Sexual activity: Not on file  Lifestyle  .  Physical activity    Days per week: Not on file    Minutes per session: Not on file  . Stress: Not on file  Relationships  . Social Herbalist on phone: Not on file    Gets together: Not on file    Attends religious service: Not on file    Active member of club or organization: Not on file  Attends meetings of clubs or organizations: Not on file    Relationship status: Not on file  . Intimate partner violence    Fear of current or ex partner: Not on file    Emotionally abused: Not on file    Physically abused: Not on file    Forced sexual activity: Not on file  Other Topics Concern  . Not on file  Social History Narrative   Retired 2007    1 son in [place] area   Spends part time in [place]      Has living will   Husband then son is health care POA   Not sure about DNR---will keep open resuscitation for now    No tube feedings if cognitively unaware               Review of Systems Allergies still an issue Ongoing balance issues due to Schwannoma    Objective:   Physical Exam  Musculoskeletal:     Comments: Unable to get out of chair without significant help with arms  Neurological:  Antalgic gait for right hip Needs to hold when turning--unstable Romberg---sways but didn't fall           Assessment & Plan:

## 2019-08-24 ENCOUNTER — Telehealth: Payer: Self-pay | Admitting: Internal Medicine

## 2019-08-24 MED ORDER — CYANOCOBALAMIN 1000 MCG/ML IJ SOLN: INTRAMUSCULAR | 3 refills | Status: DC

## 2019-08-24 NOTE — Telephone Encounter (Signed)
Patient requested a refill on her B-12 from [place] by [employer].  [facility] said they requested a refill and it was refused.  Patient wants to know why it was refused.

## 2019-08-24 NOTE — Telephone Encounter (Signed)
Spoke to pt. Not sure why they told her it was denied. We never received a refill request for it. I have sent in the refill for her.

## 2019-09-17 NOTE — Progress Notes (Signed)
Cardiology Office Note  Date:  09/19/2019   ID:  EMOGENE SCHMUHL, DOB 10-07-1935, MRN [id number]  PCP:  Venia Carbon, MD   Chief Complaint  Patient presents with  . other    12 mo f/u AF, HTN, Hyperlipidemia. Medications reviewed verbally."Doing well"    HPI:  Jennifer Schmitt is a very pleasant 84 year old woman with history of  paroxysmal atrial fibrillation,  obstructive sleep apnea who does not wear CPAP,  hypertension,  hyperlipidemia  who presents for routine followup of her atrial fibrillation. She spends much of her winter in [place], has a cardiologist there  [place] good Exercises, No significant atrial fib Active, reading  On crestor, denies having significant side effects  History of chronic dizziness, vertigo symptoms.   Previously with symptoms of polyuria  Denies having significant atrial fibrillation Last was early 2020  EKG personally reviewed by myself on todays visit Shows normal sinus rhythm rate 66 bpm no significant ST or T wave changes  Family history Parents with no CAD, no MI Grandmother with CHF  Other past medical history reviewed Carotid u/s 2011: intimal thickening She reports that she had recent screening, was told she had no significant carotid disease. She will bring in the report for our review  05/31/2015 had severe vomiting, went to the emergency room Electrolytes were normal, no atrial fibrillation Lab work reviewed with her today from recent lab draw, total cholesterol 160 B-12 improving, sedimentation rate 40  She reports having an episode of atrial fibrillation in August 2015 It lasted approximately 6 hours. She took diltiazem and propranolol.  she converted back to normal sinus rhythm   PMH:   has a past medical history of Adenomatous colon polyp, Allergic rhinitis due to pollen, Anemia, Anxiety, Bronchiectasis ([place]), Complication of anesthesia, Degenerative disc disease, Dysrhythmia, GERD (gastroesophageal reflux disease),  Hearing loss in right ear, Heart murmur, HOH (hard of hearing), Hyperlipidemia, Hypertension, Osteoarthritis, multiple sites, PAF (paroxysmal atrial fibrillation) ([place]), PMR (polymyalgia rheumatica) (H[facility]), PONV (postoperative nausea and vomiting), Rheumatic fever, Sleep apnea, Urge incontinence, Vertigo, and Vertigo.  PSH:    Past Surgical History:  Procedure Laterality Date  . ABDOMINAL HYSTERECTOMY    . ANKLE FRACTURE SURGERY Left 1996  . BACK SURGERY    . BREAST BIOPSY Left 2010   negative  . CATARACT EXTRACTION W/PHACO Left 10/05/2018   Procedure: CATARACT EXTRACTION PHACO AND INTRAOCULAR LENS PLACEMENT ([place]);  Surgeon: Birder Robson, MD;  Location: [facility] ORS;  Service: Ophthalmology;  Laterality: Left;  [place] 00:40 CDE 6.92 fluid pack lot # [id number]     . CATARACT EXTRACTION W/PHACO Right 10/26/2018   Procedure: CATARACT EXTRACTION PHACO AND INTRAOCULAR LENS PLACEMENT (IOC);  Surgeon: Birder Robson, MD;  Location: [facility] OR[facility];  Service: Ophthalmology;  Laterality: Right;  [place]  00:47 CDE 7.11 Fluid pack lot # [id number]  . DEXA  11/09/06   normal  . FRACTURE SURGERY    . Meningitis  1963  . SPINE SURGERY    . SYNOVECTOMY Right 1973   elbow  . TONSILLECTOMY AND ADENOIDECTOMY      Current Outpatient Medications  Medication Sig Dispense Refill  . Cholecalciferol (VITAMIN D) 2000 units CAPS Take 2,000 Units by mouth daily.    . cyanocobalamin (,VITAMIN B-12,) 1000 MCG/ML injection INJECT 1ML MONTHLY 3 mL 3  . diltiazem (CARDIZEM) 30 MG tablet Take 1 tablet (30 mg total) by mouth 3 (three) times daily as needed. (Patient taking differently: Take 30 mg by mouth 3 (three) times daily as needed (  atrial fibrillation lasting more than 2 hours). ) 30 tablet 6  . Estradiol (IMVEXXY MAINTENANCE PACK) 4 MCG INST Place 1 ampule vaginally 2 (two) times a week. 8 each 11  . folic acid (FOLVITE) 1 MG tablet TAKE 1 TABLET BY MOUTH EVERY DAY 90 tablet 3  . hydrocortisone 2.5 % cream Apply topically  3 (three) times daily as needed. 28 g 3  . ipratropium (ATROVENT) 0.06 % nasal spray Place 1 spray into both nostrils daily.     . metoprolol succinate (TOPROL-XL) 25 MG 24 hr tablet TAKE 1 TABLET (25 MG TOTAL) BY MOUTH DAILY. 90 tablet 3  . PAZEO 0.7 % SOLN Place 1 drop into both eyes at bedtime.  6  . Probiotic CAPS Take 1 capsule by mouth daily.    . propranolol (INDERAL) 10 MG tablet Take 1 tablet (10 mg total) by mouth 3 (three) times daily as needed. (Patient taking differently: Take 10 mg by mouth 3 (three) times daily as needed (atrial fibrillation lasting more than 2 hours). ) 30 tablet 6  . rivaroxaban (XARELTO) 20 MG TABS tablet TAKE 1 TABLET BY MOUTH EVERY DAY WITH EVENING MEAL 90 tablet 1  . rosuvastatin (CRESTOR) 5 MG tablet Take 1 tablet (5 mg total) by mouth daily. 90 tablet 3  . valsartan-hydrochlorothiazide (DIOVAN-HCT) 80-12.5 MG tablet Take 0.5 tablets by mouth daily. 45 tablet 3   No current facility-administered medications for this visit.      Allergies:   Actonel [risedronate sodium], Boniva [ibandronic acid], Diclofenac, Fosamax [alendronate sodium], Lipitor [atorvastatin], Miacalcin [calcitonin ([place])], Talwin [pentazocine], Latex, and Niaspan [niacin er]   Social History:  The patient  reports that she has never smoked. She has never used smokeless tobacco. She reports current alcohol use. She reports that she does not use drugs.   Family History:   family history includes Arthritis in her father and mother; Breast cancer (age of onset: 6) in her paternal aunt; Cancer in her father; Colon cancer in her maternal grandmother; Diabetes in her father; Heart disease in her mother; Ovarian cancer in her maternal grandmother; Rheum arthritis in her mother; Schizophrenia in her mother; Uterine cancer in her maternal grandmother.    Review of Systems: Review of Systems  Constitutional: Negative.   Respiratory: Negative.   Cardiovascular: Negative.   Gastrointestinal:  Negative.   Musculoskeletal: Negative.   Neurological: Negative.   Psychiatric/Behavioral: Negative.   All other systems reviewed and are negative.    PHYSICAL EXAM: VS:  BP (!) 148/78 (BP Location: Left Arm, Patient Position: Sitting, Cuff Size: Normal)   Pulse 66   Ht 5\' 6"  (1.676 m)   Wt 188 lb (85.3 kg)   BMI 30.34 kg/m  , BMI Body mass index is 30.34 kg/m. Constitutional:  oriented to person, place, and time. No distress.  HENT:  Head: Grossly normal Eyes:  no discharge. No scleral icterus.  Neck: No JVD, no carotid bruits  Cardiovascular: Regular rate and rhythm, no murmurs appreciated Pulmonary/Chest: Clear to auscultation bilaterally, no wheezes or rails Abdominal: Soft.  no distension.  no tenderness.  Musculoskeletal: Normal range of motion Neurological:  normal muscle tone. Coordination normal. No atrophy Skin: Skin warm and dry Psychiatric: normal affect, pleasant   Recent Labs: 11/05/2018: ALT 13; BUN 22; Creatinine, Ser 0.93; Hemoglobin 12.8; Platelets 237.0; Potassium 4.9; Sodium 141    Lipid Panel Lab Results  Component Value Date   CHOL 153 11/05/2018   HDL 63.20 11/05/2018   LDLCALC 78 11/05/2018  TRIG 59.0 11/05/2018      Wt Readings from Last 3 Encounters:  09/19/19 188 lb (85.3 kg)  07/29/19 189 lb (85.7 kg)  11/30/18 187 lb (84.8 kg)     ASSESSMENT AND PLAN:  Paroxysmal atrial fibrillation ([place]) - Plan: EKG 12-Lead Tolerating anticoagulation Continue metoprolol No sx  Essential hypertension - Plan: EKG 12-Lead Blood pressure is well controlled on today's visit. No changes made to the medications.  Stable  Pure hypercholesterolemia Tolerating Crestor 5 daily Numbers at goal  Encounter for anticoagulation discussion and counseling Tolerating anticoagulation Discussed indications/situations when she would stop anticoagulation such as trauma, falls No sx   Total encounter time more than 25 minutes  Greater than 50% was spent in  counseling and coordination of care with the patient   Disposition:   F/U  12 months   No orders of the defined types were placed in this encounter.    Signed, Esmond Plants, M.D., Ph.D. 09/19/2019  [place], [place]

## 2019-09-19 ENCOUNTER — Other Ambulatory Visit: Payer: Self-pay

## 2019-09-19 ENCOUNTER — Encounter: Payer: Self-pay | Admitting: Cardiovascular Disease

## 2019-09-19 ENCOUNTER — Ambulatory Visit (INDEPENDENT_AMBULATORY_CARE_PROVIDER_SITE_OTHER): Payer: Medicare Other | Admitting: Cardiovascular Disease

## 2019-09-19 VITALS — BP 148/78 | HR 66 | Ht 66.0 in | Wt 188.0 lb

## 2019-09-19 DIAGNOSIS — E782 Mixed hyperlipidemia: Secondary | ICD-10-CM

## 2019-09-19 DIAGNOSIS — I10 Essential (primary) hypertension: Secondary | ICD-10-CM

## 2019-09-19 DIAGNOSIS — I480 Paroxysmal atrial fibrillation: Secondary | ICD-10-CM | POA: Diagnosis not present

## 2019-09-19 MED ORDER — PROPRANOLOL HCL 10 MG PO TABS: 10.0000 mg | ORAL_TABLET | Freq: Three times a day (TID) | ORAL | 6 refills | Status: DC | PRN

## 2019-09-19 MED ORDER — DILTIAZEM HCL 30 MG PO TABS: 30.0000 mg | ORAL_TABLET | Freq: Three times a day (TID) | ORAL | 6 refills | Status: DC | PRN

## 2019-09-19 NOTE — Patient Instructions (Signed)

## 2019-10-27 ENCOUNTER — Other Ambulatory Visit: Payer: Self-pay

## 2019-10-27 MED ORDER — ROSUVASTATIN CALCIUM 5 MG PO TABS: 5.0000 mg | ORAL_TABLET | Freq: Every day | ORAL | 3 refills | Status: DC

## 2019-10-27 NOTE — Telephone Encounter (Signed)
Refill sent for rosuvastatin 5 mg

## 2019-11-09 ENCOUNTER — Other Ambulatory Visit: Payer: Self-pay

## 2019-11-09 ENCOUNTER — Ambulatory Visit (INDEPENDENT_AMBULATORY_CARE_PROVIDER_SITE_OTHER): Payer: Medicare Other | Admitting: Internal Medicine

## 2019-11-09 ENCOUNTER — Encounter: Payer: Self-pay | Admitting: Internal Medicine

## 2019-11-09 VITALS — BP 122/84 | HR 66 | Temp 98.1°F | Ht 66.75 in | Wt 191.0 lb

## 2019-11-09 DIAGNOSIS — D510 Vitamin B12 deficiency anemia due to intrinsic factor deficiency: Secondary | ICD-10-CM | POA: Diagnosis not present

## 2019-11-09 DIAGNOSIS — G3184 Mild cognitive impairment, so stated: Secondary | ICD-10-CM | POA: Insufficient documentation

## 2019-11-09 DIAGNOSIS — Z7189 Other specified counseling: Secondary | ICD-10-CM

## 2019-11-09 DIAGNOSIS — Z0000 Encounter for general adult medical examination without abnormal findings: Secondary | ICD-10-CM

## 2019-11-09 DIAGNOSIS — I480 Paroxysmal atrial fibrillation: Secondary | ICD-10-CM | POA: Diagnosis not present

## 2019-11-09 DIAGNOSIS — Z7409 Other reduced mobility: Secondary | ICD-10-CM

## 2019-11-09 DIAGNOSIS — I10 Essential (primary) hypertension: Secondary | ICD-10-CM | POA: Diagnosis not present

## 2019-11-09 DIAGNOSIS — M353 Polymyalgia rheumatica: Secondary | ICD-10-CM | POA: Diagnosis not present

## 2019-11-09 DIAGNOSIS — D3610 Benign neoplasm of peripheral nerves and autonomic nervous system, unspecified: Secondary | ICD-10-CM

## 2019-11-09 DIAGNOSIS — F39 Unspecified mood [affective] disorder: Secondary | ICD-10-CM | POA: Diagnosis not present

## 2019-11-09 LAB — COMPREHENSIVE METABOLIC PANEL
ALT: 14 U/L (ref 0–35)
AST: 17 U/L (ref 0–37)
Albumin: 4.2 g/dL (ref 3.5–5.2)
Alkaline Phosphatase: 71 U/L (ref 39–117)
BUN: 13 mg/dL (ref 6–23)
CO2: 32 mEq/L (ref 19–32)
Calcium: 9.5 mg/dL (ref 8.4–10.5)
Chloride: 102 mEq/L (ref 96–112)
Creatinine, Ser: 0.75 mg/dL (ref 0.40–1.20)
GFR: 73.95 mL/min (ref >60.00)
Glucose, Bld: 92 mg/dL (ref 70–99)
Potassium: 4.7 mEq/L (ref 3.5–5.1)
Sodium: 140 mEq/L (ref 135–145)
Total Bilirubin: 0.5 mg/dL (ref 0.2–1.2)
Total Protein: 7.6 g/dL (ref 6.0–8.3)

## 2019-11-09 LAB — LIPID PANEL
Cholesterol: 167 mg/dL (ref 0–200)
HDL: 54.2 mg/dL (ref >39.00)
LDL Cholesterol: 93 mg/dL (ref 0–99)
NonHDL: 113.13
Total CHOL/HDL Ratio: 3
Triglycerides: 101 mg/dL (ref 0.0–149.0)
VLDL: 20.2 mg/dL (ref 0.0–40.0)

## 2019-11-09 LAB — CBC
HCT: 38.1 % (ref 36.0–46.0)
Hemoglobin: 12.6 g/dL (ref 12.0–15.0)
MCHC: 33.2 g/dL (ref 30.0–36.0)
MCV: 97.8 fl (ref 78.0–100.0)
Platelets: 242 10*3/uL (ref 150.0–400.0)
RBC: 3.89 Mil/uL (ref 3.87–5.11)
RDW: 13.5 % (ref 11.5–15.5)
WBC: 4.7 10*3/uL (ref 4.0–10.5)

## 2019-11-09 LAB — VITAMIN B12: Vitamin B-12: 278 pg/mL (ref 211–911)

## 2019-11-09 LAB — SEDIMENTATION RATE: Sed Rate: 53 mm/hr — ABNORMAL HIGH (ref 0–30)

## 2019-11-09 NOTE — Assessment & Plan Note (Signed)
Some dysthymia--mostly from COVID and husband's decline No MDD No rx but discussed respite

## 2019-11-09 NOTE — Progress Notes (Signed)
Subjective:    Patient ID: Jennifer Schmitt, female    DOB: [birth year], 84 y.o.   MRN: [id number]  HPI Here for Medicare wellness visit and follow up of chronic health conditions Reviewed form and advanced directives Reviewed other doctors Rare alcohol No tobacco Is doing some exercise--mostly with the PT for now No hearing on right---left is fine Vision is okay---some eye infections though No falls Does all the instrumental ADLs  Has done very well with the PT Almost done Will be working on setting up home program Mild muscle pain---better with exercise----but not like the PMR (some twinges by left shoulder/chest only)  Still with memory issues Mildly worse--but stress with husband not doing well and COVID Still does bills and everything--no functional decline  No palpitations No chest pain or SOB Still some vestibular dizziness if turns quick---but no syncope  Has had down days--relates to restriction in social interactions Looking forward to seeing son and family for Thanksgiving (with social distancing) "High strung" as well Not anhedonic  Continues on the monthly B12 injections Last close to a month ago  Current Outpatient Medications on File Prior to Visit  Medication Sig Dispense Refill  . Cholecalciferol (VITAMIN D) 2000 units CAPS Take 2,000 Units by mouth daily.    . cyanocobalamin (,VITAMIN B-12,) 1000 MCG/ML injection INJECT 1ML MONTHLY 3 mL 3  . diltiazem (CARDIZEM) 30 MG tablet Take 1 tablet (30 mg total) by mouth 3 (three) times daily as needed (atrial fibrillation lasting more than 2 hours). 10 tablet 6  . Estradiol (IMVEXXY MAINTENANCE PACK) 4 MCG INST Place 1 ampule vaginally 2 (two) times a week. 8 each 11  . fluocinolone (VANOS) 0.01 % cream     . folic acid (FOLVITE) 1 MG tablet TAKE 1 TABLET BY MOUTH EVERY DAY 90 tablet 3  . hydrocortisone 2.5 % cream Apply topically 3 (three) times daily as needed. 28 g 3  . ipratropium (ATROVENT) 0.06 % nasal  spray Place 1 spray into both nostrils daily.     Marland Kitchen ketoconazole (NIZORAL) 2 % cream     . metoprolol succinate (TOPROL-XL) 25 MG 24 hr tablet TAKE 1 TABLET (25 MG TOTAL) BY MOUTH DAILY. 90 tablet 3  . neomycin-polymyxin-dexamethasone (MAXITROL) 0.1 % ophthalmic suspension     . PAZEO 0.7 % SOLN Place 1 drop into both eyes at bedtime.  6  . Probiotic CAPS Take 1 capsule by mouth daily.    . propranolol (INDERAL) 10 MG tablet Take 1 tablet (10 mg total) by mouth 3 (three) times daily as needed (atrial fibrillation lasting more than 2 hours). 10 tablet 6  . rivaroxaban (XARELTO) 20 MG TABS tablet TAKE 1 TABLET BY MOUTH EVERY DAY WITH EVENING MEAL 90 tablet 1  . rosuvastatin (CRESTOR) 5 MG tablet Take 1 tablet (5 mg total) by mouth daily. 90 tablet 3  . valsartan-hydrochlorothiazide (DIOVAN-HCT) 80-12.5 MG tablet Take 0.5 tablets by mouth daily. 45 tablet 3   No current facility-administered medications on file prior to visit.     Allergies  Allergen Reactions  . Actonel [Risedronate Sodium] Nausea And Vomiting  . Boniva [Ibandronic Acid] Nausea And Vomiting  . Diclofenac Other (See Comments)    Voltaren Gel aggravated her sinus passages  . Fosamax [Alendronate Sodium] Nausea And Vomiting  . Lipitor [Atorvastatin] Other (See Comments)    Muscle aches. Tolerates Crestor.  Dion Saucier [Calcitonin ([place])] Other (See Comments)    Sneezing   . Talwin [Pentazocine] Other (See Comments)  Hallucinations   . Latex Rash    Sneezing, watery eyes.  . Niaspan [Niacin Er] Swelling and Rash    Past Medical History:  Diagnosis Date  . Adenomatous colon polyp   . Allergic rhinitis due to pollen   . Anemia   . Anxiety   . Bronchiectasis ([place])   . Complication of anesthesia   . Degenerative disc disease    cervical and lumbar  . Dysrhythmia   . GERD (gastroesophageal reflux disease)   . Hearing loss in right ear   . Heart murmur   . HOH (hard of hearing)    AIDS  . Hyperlipidemia   .  Hypertension   . Osteoarthritis, multiple sites   . PAF (paroxysmal atrial fibrillation) ([place])    a. 01/2013 [facility]): EF 60-65%, no rwma, mildly dil LA/RA, mild AI/TR, trace MR, nl RV size/fxn, mild PAH; b. CHA2DS2VASc = 4-->Chronic Xarelto.  Marland Kitchen PMR (polymyalgia rheumatica) ([facility])   . PONV (postoperative nausea and vomiting)   . Rheumatic fever   . Sleep apnea    NO CPAP  . Urge incontinence   . Vertigo   . Vertigo     Past Surgical History:  Procedure Laterality Date  . ABDOMINAL HYSTERECTOMY    . ANKLE FRACTURE SURGERY Left 1996  . BACK SURGERY    . BREAST BIOPSY Left 2010   negative  . CATARACT EXTRACTION W/PHACO Left 10/05/2018   Procedure: CATARACT EXTRACTION PHACO AND INTRAOCULAR LENS PLACEMENT ([place]);  Surgeon: Birder Robson, MD;  Location: [facility] OR[facility];  Service: Ophthalmology;  Laterality: Left;  [place] 00:40 CDE 6.92 fluid pack lot # [id number]     . CATARACT EXTRACTION W/PHACO Right 10/26/2018   Procedure: CATARACT EXTRACTION PHACO AND INTRAOCULAR LENS PLACEMENT (IOC);  Surgeon: Birder Robson, MD;  Location: [facility] OR[facility];  Service: Ophthalmology;  Laterality: Right;  [place]  00:47 CDE 7.11 Fluid pack lot # [id number]  . DEXA  11/09/06   normal  . FRACTURE SURGERY    . Meningitis  1963  . SPINE SURGERY    . SYNOVECTOMY Right 1973   elbow  . TONSILLECTOMY AND ADENOIDECTOMY      Family History  Problem Relation Age of Onset  . Schizophrenia Mother        paranoid  . Heart disease Mother        from psych meds  . Arthritis Mother   . Rheum arthritis Mother   . Cancer Father        prostate  . Arthritis Father   . Diabetes Father   . Ovarian cancer Maternal Grandmother   . Uterine cancer Maternal Grandmother   . Colon cancer Maternal Grandmother   . Breast cancer Paternal Aunt 9    Social History   Socioeconomic History  . Marital status: Married    Spouse name: Not on file  . Number of children: 1  . Years of education: Not on file  . Highest  education level: Not on file  Occupational History  . Occupation: Scientist, physiological and college professor    Comment: [employer]--PhD in Special education  Social Needs  . Financial resource strain: Not on file  . Food insecurity    Worry: Not on file    Inability: Not on file  . Transportation needs    Medical: Not on file    Non-medical: Not on file  Tobacco Use  . Smoking status: Never Smoker  . Smokeless tobacco: Never Used  Substance and Sexual Activity  .  Alcohol use: Yes    Comment: OCCAS  . Drug use: No  . Sexual activity: Not on file  Lifestyle  . Physical activity    Days per week: Not on file    Minutes per session: Not on file  . Stress: Not on file  Relationships  . Social Herbalist on phone: Not on file    Gets together: Not on file    Attends religious service: Not on file    Active member of club or organization: Not on file    Attends meetings of clubs or organizations: Not on file    Relationship status: Not on file  . Intimate partner violence    Fear of current or ex partner: Not on file    Emotionally abused: Not on file    Physically abused: Not on file    Forced sexual activity: Not on file  Other Topics Concern  . Not on file  Social History Narrative   Retired 2007    1 son in [place] area   Spends part time in [place]      Has living will   Husband then son is health care POA   Not sure about DNR---will keep open resuscitation for now    No tube feedings if cognitively unaware               Review of Systems Appetite is fine Weight up slightly Not sleeping well---affected by aching and nocturia Wears seat belt Teeth fine--regular with dentist Keeps up with derm--regular visit (has creams for various spots) Still on estrogen--twice a week No dysuria or hematuria--just frequency (incontinence--has sling) Rare heartburn--no dysphagia Bowels variable---occasional bleeding hemorrhoid (just on paper)    Objective:    Physical Exam  Constitutional: She is oriented to person, place, and time. She appears well-developed. No distress.  HENT:  Mouth/Throat: Oropharynx is clear and moist. No oropharyngeal exudate.  Neck: No thyromegaly present.  Cardiovascular: Normal rate, regular rhythm, normal heart sounds and intact distal pulses. Exam reveals no gallop.  No murmur heard. Respiratory: Effort normal and breath sounds normal. No respiratory distress. She has no wheezes. She has no rales.  GI: Soft. There is no abdominal tenderness.  Musculoskeletal:        General: No tenderness or edema.  Lymphadenopathy:    She has no cervical adenopathy.  Neurological: She is alert and oriented to person, place, and time.  President--- "Daisy Floro, Obama, Bush" 100-93-?  Not good with numbers D-l-o-r-w Recall 3/3  Skin: No rash noted. No erythema.  Psychiatric: She has a normal mood and affect. Her behavior is normal.           Assessment & Plan:

## 2019-11-09 NOTE — Progress Notes (Signed)
Hearing Screening   125Hz  250Hz  500Hz  1000Hz  2000Hz  3000Hz  4000Hz  6000Hz  8000Hz   Right ear:           Left ear:           Comments: Audiologist July 2020.  Vision Screening Comments: November 2020

## 2019-11-09 NOTE — Assessment & Plan Note (Signed)
No change and no functional decline Discussed stimulation--social and cognitive, as well as exercise

## 2019-11-09 NOTE — Assessment & Plan Note (Signed)
Some shoulder/hip aching Will restart prednisone if sed rate up

## 2019-11-09 NOTE — Assessment & Plan Note (Signed)
BP Readings from Last 3 Encounters:  11/09/19 122/84  09/19/19 (!) 148/78  07/29/19 118/74   Good control

## 2019-11-09 NOTE — Assessment & Plan Note (Signed)
Some residual vestibular symptoms

## 2019-11-09 NOTE — Assessment & Plan Note (Signed)
I have personally reviewed the Medicare Annual Wellness questionnaire and have noted 1. The patient's medical and social history 2. Their use of alcohol, tobacco or illicit drugs 3. Their current medications and supplements 4. The patient's functional ability including ADL's, fall risks, home safety risks and hearing or visual             impairment. 5. Diet and physical activities 6. Evidence for depression or mood disorders  The patients weight, height, BMI and visual acuity have been recorded in the chart I have made referrals, counseling and provided education to the patient based review of the above and I have provided the pt with a written personalized care plan for preventive services.  I have provided you with a copy of your personalized plan for preventive services. Please take the time to review along with your updated medication list.  Had flu vaccine Discussed regular exercise No cancer screening due to age

## 2019-11-09 NOTE — Assessment & Plan Note (Signed)
Regular now On rivaroxaban

## 2019-11-09 NOTE — Assessment & Plan Note (Signed)
See social history 

## 2019-11-09 NOTE — Assessment & Plan Note (Signed)
Will check B12 levels

## 2019-11-11 ENCOUNTER — Telehealth: Payer: Self-pay

## 2019-11-11 MED ORDER — PREDNISONE 20 MG PO TABS: 20.0000 mg | ORAL_TABLET | Freq: Every day | ORAL | 0 refills | Status: DC

## 2019-11-11 NOTE — Telephone Encounter (Signed)
prednisone 20mg  daily (send #30 x 0)

## 2019-11-30 ENCOUNTER — Other Ambulatory Visit: Payer: Self-pay | Admitting: Cardiovascular Disease

## 2019-12-06 ENCOUNTER — Other Ambulatory Visit: Payer: Self-pay

## 2019-12-06 ENCOUNTER — Ambulatory Visit (INDEPENDENT_AMBULATORY_CARE_PROVIDER_SITE_OTHER): Payer: Medicare Other | Admitting: Internal Medicine

## 2019-12-06 ENCOUNTER — Encounter: Payer: Self-pay | Admitting: Internal Medicine

## 2019-12-06 DIAGNOSIS — M353 Polymyalgia rheumatica: Secondary | ICD-10-CM

## 2019-12-06 MED ORDER — PREDNISONE 5 MG PO TABS: 15.0000 mg | ORAL_TABLET | Freq: Every day | ORAL | 11 refills | Status: DC

## 2019-12-06 NOTE — Assessment & Plan Note (Signed)
Clear exacerbation--though not as bad as when first diagnosed Symptoms gone on 20mg  daily Will cut down to 15mg  now and then further slow wean

## 2019-12-06 NOTE — Patient Instructions (Signed)
Cut back on prednisone to 15mg  daily (if you worsen, go back to 20mg  till your symptoms are gone, and then reduce to 15mg  2 days a week and slowly go down from there). If you are doing fine, in 1-2 months--cut down to 15mg  alternating with 10mg  every other day. If that is tolerated, go down to 10 daily in 1-2 months. If you continue to do well, you can decrease further to 10 alternating with 5 and eventually 10mg  alternating with 0.

## 2019-12-06 NOTE — Progress Notes (Signed)
Subjective:    Patient ID: Jennifer Schmitt, female    DOB: Mar 23, 1935, 84 y.o.   MRN: [id number]  HPI Here for follow up of PMR  This visit occurred during the SARS-CoV-2 public health emergency.  Safety protocols were in place, including screening questions prior to the visit, additional usage of staff PPE, and extensive cleaning of exam room while observing appropriate contact time as indicated for disinfecting solutions.   Feels much better now "overall" Aching is better  Energy levels are much better Mind is sharper Has been highly productive as well PT has also helped  Past PMR took about 18 months to remit Slow prednisone wean  Current Outpatient Medications on File Prior to Visit  Medication Sig Dispense Refill  . Cholecalciferol (VITAMIN D) 2000 units CAPS Take 2,000 Units by mouth daily.    . cyanocobalamin (,VITAMIN B-12,) 1000 MCG/ML injection INJECT 1ML MONTHLY 3 mL 3  . diltiazem (CARDIZEM) 30 MG tablet Take 1 tablet (30 mg total) by mouth 3 (three) times daily as needed (atrial fibrillation lasting more than 2 hours). 10 tablet 6  . Estradiol (IMVEXXY MAINTENANCE PACK) 4 MCG INST Place 1 ampule vaginally 2 (two) times a week. 8 each 11  . fluocinolone (VANOS) 0.01 % cream     . folic acid (FOLVITE) 1 MG tablet TAKE 1 TABLET BY MOUTH EVERY DAY 90 tablet 3  . hydrocortisone 2.5 % cream Apply topically 3 (three) times daily as needed. 28 g 3  . ipratropium (ATROVENT) 0.06 % nasal spray Place 1 spray into both nostrils daily.     Marland Kitchen ketoconazole (NIZORAL) 2 % cream     . metoprolol succinate (TOPROL-XL) 25 MG 24 hr tablet TAKE 1 TABLET(25 MG) BY MOUTH DAILY 90 tablet 2  . predniSONE (DELTASONE) 20 MG tablet Take 1 tablet (20 mg total) by mouth daily with breakfast. 30 tablet 0  . Probiotic CAPS Take 1 capsule by mouth daily.    . propranolol (INDERAL) 10 MG tablet Take 1 tablet (10 mg total) by mouth 3 (three) times daily as needed (atrial fibrillation lasting more than 2  hours). 10 tablet 6  . rivaroxaban (XARELTO) 20 MG TABS tablet TAKE 1 TABLET BY MOUTH EVERY DAY WITH EVENING MEAL 90 tablet 1  . rosuvastatin (CRESTOR) 5 MG tablet Take 1 tablet (5 mg total) by mouth daily. 90 tablet 3  . valsartan-hydrochlorothiazide (DIOVAN-HCT) 80-12.5 MG tablet Take 0.5 tablets by mouth daily. 45 tablet 3   No current facility-administered medications on file prior to visit.    Allergies  Allergen Reactions  . Actonel [Risedronate Sodium] Nausea And Vomiting  . Boniva [Ibandronic Acid] Nausea And Vomiting  . Diclofenac Other (See Comments)    Voltaren Gel aggravated her sinus passages  . Fosamax [Alendronate Sodium] Nausea And Vomiting  . Lipitor [Atorvastatin] Other (See Comments)    Muscle aches. Tolerates Crestor.  Dion Saucier [Calcitonin ([place])] Other (See Comments)    Sneezing   . Talwin [Pentazocine] Other (See Comments)    Hallucinations   . Latex Rash    Sneezing, watery eyes.  . Niaspan [Niacin Er] Swelling and Rash    Past Medical History:  Diagnosis Date  . Adenomatous colon polyp   . Allergic rhinitis due to pollen   . Anemia   . Anxiety   . Bronchiectasis ([place])   . Complication of anesthesia   . Degenerative disc disease    cervical and lumbar  . Dysrhythmia   . GERD (gastroesophageal  reflux disease)   . Hearing loss in right ear   . Heart murmur   . HOH (hard of hearing)    AIDS  . Hyperlipidemia   . Hypertension   . Osteoarthritis, multiple sites   . PAF (paroxysmal atrial fibrillation) ([place])    a. 01/2013 Echo [facility]): EF 60-65%, no rwma, mildly dil LA/RA, mild AI/TR, trace MR, nl RV size/fxn, mild PAH; b. CHA2DS2VASc = 4-->Chronic Xarelto.  Marland Kitchen PMR (polymyalgia rheumatica) (HCC)   . PONV (postoperative nausea and vomiting)   . Rheumatic fever   . Sleep apnea    NO CPAP  . Urge incontinence   . Vertigo   . Vertigo     Past Surgical History:  Procedure Laterality Date  . ABDOMINAL HYSTERECTOMY    . ANKLE  FRACTURE SURGERY Left 1996  . BACK SURGERY    . BREAST BIOPSY Left 2010   negative  . CATARACT EXTRACTION W/PHACO Left 10/05/2018   Procedure: CATARACT EXTRACTION PHACO AND INTRAOCULAR LENS PLACEMENT ([place]);  Surgeon: Birder Robson, MD;  Location: [facility] ORS;  Service: Ophthalmology;  Laterality: Left;  [place] 00:40 CDE 6.92 fluid pack lot # [id number]     . CATARACT EXTRACTION W/PHACO Right 10/26/2018   Procedure: CATARACT EXTRACTION PHACO AND INTRAOCULAR LENS PLACEMENT ([facility]);  Surgeon: Birder Robson, MD;  Location: [facility] ORS;  Service: Ophthalmology;  Laterality: Right;  [place]  00:47 CDE 7.11 Fluid pack lot # [id number]  . DEXA  11/09/06   normal  . FRACTURE SURGERY    . Meningitis  1963  . SPINE SURGERY    . SYNOVECTOMY Right 1973   elbow  . TONSILLECTOMY AND ADENOIDECTOMY      Family History  Problem Relation Age of Onset  . Schizophrenia Mother        paranoid  . Heart disease Mother        from psych meds  . Arthritis Mother   . Rheum arthritis Mother   . Cancer Father        prostate  . Arthritis Father   . Diabetes Father   . Ovarian cancer Maternal Grandmother   . Uterine cancer Maternal Grandmother   . Colon cancer Maternal Grandmother   . Breast cancer Paternal Aunt 45    Social History   Socioeconomic History  . Marital status: Married    Spouse name: Not on file  . Number of children: 1  . Years of education: Not on file  . Highest education level: Not on file  Occupational History  . Occupation: Scientist, physiological and college professor    Comment: [employer]--PhD in Special education  Tobacco Use  . Smoking status: Never Smoker  . Smokeless tobacco: Never Used  Substance and Sexual Activity  . Alcohol use: Yes    Comment: OCCAS  . Drug use: No  . Sexual activity: Not on file  Other Topics Concern  . Not on file  Social History Narrative   Retired 2007    1 son in [place] area   Spends part time in [place]      Has living will   Husband then  son is health care POA   Not sure about DNR---will keep open resuscitation for now    No tube feedings if cognitively unaware               Social Determinants of Health   Financial Resource Strain:   . Difficulty of Paying Living Expenses: Not on file  Food Insecurity:   .  Worried About Charity fundraiser in the Last Year: Not on file  . Ran Out of Food in the Last Year: Not on file  Transportation Needs:   . Lack of Transportation (Medical): Not on file  . Lack of Transportation (Non-Medical): Not on file  Physical Activity:   . Days of Exercise per Week: Not on file  . Minutes of Exercise per Session: Not on file  Stress:   . Feeling of Stress : Not on file  Social Connections:   . Frequency of Communication with Friends and Family: Not on file  . Frequency of Social Gatherings with Friends and Family: Not on file  . Attends Religious Services: Not on file  . Active Member of Clubs or Organizations: Not on file  . Attends Archivist Meetings: Not on file  . Marital Status: Not on file  Intimate Partner Violence:   . Fear of Current or Ex-Partner: Not on file  . Emotionally Abused: Not on file  . Physically Abused: Not on file  . Sexually Abused: Not on file   Review of Systems  Sleep is not great---mind still going from the prednisone Weight down some--appetite is not up on this     Objective:   Physical Exam  Constitutional: She appears well-developed. No distress.  Psychiatric: She has a normal mood and affect. Her behavior is normal.           Assessment & Plan:

## 2020-01-02 ENCOUNTER — Ambulatory Visit: Payer: Medicare Other | Admitting: Obstetrics & Gynecology

## 2020-01-04 ENCOUNTER — Other Ambulatory Visit: Payer: Self-pay

## 2020-01-04 MED ORDER — VALSARTAN-HYDROCHLOROTHIAZIDE 80-12.5 MG PO TABS: 0.5000 | ORAL_TABLET | Freq: Every day | ORAL | 3 refills | Status: DC

## 2020-01-17 ENCOUNTER — Other Ambulatory Visit: Payer: Self-pay | Admitting: Obstetrics & Gynecology

## 2020-01-18 ENCOUNTER — Other Ambulatory Visit: Payer: Self-pay

## 2020-01-18 ENCOUNTER — Encounter: Payer: Self-pay | Admitting: Obstetrics & Gynecology

## 2020-01-18 ENCOUNTER — Ambulatory Visit (INDEPENDENT_AMBULATORY_CARE_PROVIDER_SITE_OTHER): Payer: Medicare PPO | Admitting: Obstetrics & Gynecology

## 2020-01-18 ENCOUNTER — Other Ambulatory Visit: Payer: Self-pay | Admitting: Obstetrics & Gynecology

## 2020-01-18 VITALS — BP 122/80 | Ht 66.5 in | Wt 190.0 lb

## 2020-01-18 DIAGNOSIS — Z01419 Encounter for gynecological examination (general) (routine) without abnormal findings: Secondary | ICD-10-CM

## 2020-01-18 DIAGNOSIS — N3281 Overactive bladder: Secondary | ICD-10-CM

## 2020-01-18 DIAGNOSIS — N952 Postmenopausal atrophic vaginitis: Secondary | ICD-10-CM

## 2020-01-18 MED ORDER — IMVEXXY MAINTENANCE PACK 4 MCG VA INST: 1.0000 | VAGINAL_INSERT | VAGINAL | 3 refills | Status: DC

## 2020-01-18 MED ORDER — MIRABEGRON ER 25 MG PO TB24: 25.0000 mg | ORAL_TABLET | Freq: Every day | ORAL | 3 refills | Status: DC

## 2020-01-18 NOTE — Patient Instructions (Signed)
Mirabegron extended-release tablets What is this medicine? MIRABEGRON (MIR a BEG ron) is used to treat overactive bladder. This medicine reduces the amount of bathroom visits. It may also help to control wetting accidents. It may be used alone, but sometimes may be given with other treatments. This medicine may be used for other purposes; ask your health care provider or pharmacist if you have questions. COMMON BRAND NAME(S): Myrbetriq What should I tell my health care provider before I take this medicine? They need to know if you have any of these conditions:  high blood pressure  kidney disease  liver disease  problems urinating  prostate disease  an unusual or allergic reaction to mirabegron, other medicines, foods, dyes, or preservatives  pregnant or trying to get pregnant  breast-feeding How should I use this medicine? Take this medicine by mouth with a glass of water. Follow the directions on the prescription label. Do not cut, crush or chew this medicine. You can take it with or without food. If it upsets your stomach, take it with food. Take your medicine at regular intervals. Do not take it more often than directed. Do not stop taking except on your doctor's advice. Talk to your pediatrician regarding the use of this medicine in children. Special care may be needed. Overdosage: If you think you have taken too much of this medicine contact a poison control center or emergency room at once. NOTE: This medicine is only for you. Do not share this medicine with others. What if I miss a dose? If you miss a dose, take it as soon as you can. If it is almost time for your next dose, take only that dose. Do not take double or extra doses. What may interact with this medicine?  codeine  desipramine  digoxin  flecainide  MAOIs like Carbex, Eldepryl, Marplan, Nardil, and Parnate  methadone  metoprolol  pimozide  propafenone  thioridazine  warfarin This list may not  describe all possible interactions. Give your health care provider a list of all the medicines, herbs, non-prescription drugs, or dietary supplements you use. Also tell them if you smoke, drink alcohol, or use illegal drugs. Some items may interact with your medicine. What should I watch for while using this medicine? Visit your doctor or health care professional for regular checks on your progress. Check your blood pressure as directed. Ask your doctor or health care professional what your blood pressure should be and when you should contact him or her. You may need to limit your intake of tea, coffee, caffeinated sodas, or alcohol. These drinks may make your symptoms worse. What side effects may I notice from receiving this medicine? Side effects that you should report to your doctor or health care professional as soon as possible:  allergic reactions like skin rash, itching or hives, swelling of the face, lips, or tongue  high blood pressure  fast, irregular heartbeat  redness, blistering, peeling or loosening of the skin, including inside the mouth  signs of infection like fever or chills; pain or difficulty passing urine  trouble passing urine or change in the amount of urine Side effects that usually do not require medical attention (report to your doctor or health care professional if they continue or are bothersome):  constipation  dry mouth  headache  runny nose  stomach upset This list may not describe all possible side effects. Call your doctor for medical advice about side effects. You may report side effects to FDA at 1-800-FDA-1088. Where should   I keep my medicine? Keep out of the reach of children. Store at room temperature between 15 and 30 degrees C (59 and 86 degrees F). Throw away any unused medicine after the expiration date. NOTE: This sheet is a summary. It may not cover all possible information. If you have questions about this medicine, talk to your doctor,  pharmacist, or health care provider.  2020 Elsevier/Gold Standard (2017-04-30 11:33:21)  

## 2020-01-18 NOTE — Progress Notes (Signed)
HPI:      Ms. DAYANIS YUILLE is a 85 y.o. G1P1001 who LMP was in the past, she presents today for her annual examination.  The patient has no complaints today other than continued urinary urgency and frequency and nocturia with leakage of urine if waits too long, even if little urine in bladder.  Does not have much leakage w stressful activities.  No fever, back pain, or recent UTI.  Also, takes Imvexxy for vaginal dryness burning and dysapreunia with success, desires to continue but unsure of new health ins plan approves this for her.  The patient is sexually active. Herlast pap: approximate date 2018 and was normal and last mammogram: patient does not recall when last mammogram was done and does not desire this to be done anymore.  The patient does perform self breast exams.  There is no notable family history of breast or ovarian cancer in her family. The patient is taking hormone replacement therapy. Patient denies post-menopausal vaginal bleeding.   The patient has regular exercise: yes. The patient denies current symptoms of depression.    GYN Hx: Last Colonoscopy:years ago. Normal.  Last DEXA: several years ago.    PMHx: Past Medical History:  Diagnosis Date  . Adenomatous colon polyp   . Allergic rhinitis due to pollen   . Anemia   . Anxiety   . Bronchiectasis ([place])   . Complication of anesthesia   . Degenerative disc disease    cervical and lumbar  . Dysrhythmia   . GERD (gastroesophageal reflux disease)   . Hearing loss in right ear   . Heart murmur   . HOH (hard of hearing)    AIDS  . Hyperlipidemia   . Hypertension   . Osteoarthritis, multiple sites   . PAF (paroxysmal atrial fibrillation) ([place])    a. 01/2013 Echo [facility]): EF 60-65%, no rwma, mildly dil LA/RA, mild AI/TR, trace MR, nl RV size/fxn, mild PAH; b. CHA2DS2VASc = 4-->Chronic Xarelto.  Marland Kitchen PMR (polymyalgia rheumatica) (H[facility])   . PONV (postoperative nausea and vomiting)   . Rheumatic fever   . Sleep  apnea    NO CPAP  . Urge incontinence   . Vertigo   . Vertigo    Past Surgical History:  Procedure Laterality Date  . ABDOMINAL HYSTERECTOMY    . ANKLE FRACTURE SURGERY Left 1996  . BACK SURGERY    . BREAST BIOPSY Left 2010   negative  . CATARACT EXTRACTION W/PHACO Left 10/05/2018   Procedure: CATARACT EXTRACTION PHACO AND INTRAOCULAR LENS PLACEMENT ([place]);  Surgeon: Birder Robson, MD;  Location: [facility] ORS;  Service: Ophthalmology;  Laterality: Left;  [place] 00:40 CDE 6.92 fluid pack lot # [id number]     . CATARACT EXTRACTION W/PHACO Right 10/26/2018   Procedure: CATARACT EXTRACTION PHACO AND INTRAOCULAR LENS PLACEMENT ([facility]);  Surgeon: Birder Robson, MD;  Location: [facility] ORS;  Service: Ophthalmology;  Laterality: Right;  [place]  00:47 CDE 7.11 Fluid pack lot # [id number]  . DEXA  11/09/06   normal  . FRACTURE SURGERY    . Meningitis  1963  . SPINE SURGERY    . SYNOVECTOMY Right 1973   elbow  . TONSILLECTOMY AND ADENOIDECTOMY     Family History  Problem Relation Age of Onset  . Schizophrenia Mother        paranoid  . Heart disease Mother        from psych meds  . Arthritis Mother   . Rheum arthritis Mother   . Cancer  Father        prostate  . Arthritis Father   . Diabetes Father   . Ovarian cancer Maternal Grandmother   . Uterine cancer Maternal Grandmother   . Colon cancer Maternal Grandmother   . Breast cancer Paternal Aunt 70   Social History   Tobacco Use  . Smoking status: Never Smoker  . Smokeless tobacco: Never Used  Substance Use Topics  . Alcohol use: Yes    Comment: OCCAS  . Drug use: No    Current Outpatient Medications:  .  Cholecalciferol (VITAMIN D) 2000 units CAPS, Take 2,000 Units by mouth daily., Disp: , Rfl:  .  cyanocobalamin (,VITAMIN B-12,) 1000 MCG/ML injection, INJECT 1ML MONTHLY, Disp: 3 mL, Rfl: 3 .  diltiazem (CARDIZEM) 30 MG tablet, Take 1 tablet (30 mg total) by mouth 3 (three) times daily as needed (atrial fibrillation lasting more  than 2 hours)., Disp: 10 tablet, Rfl: 6 .  [START ON 01/19/2020] Estradiol (IMVEXXY MAINTENANCE PACK) 4 MCG INST, Place 1 ampule vaginally 2 (two) times a week., Disp: 24 each, Rfl: 3 .  fluocinolone (VANOS) 0.01 % cream, , Disp: , Rfl:  .  folic acid (FOLVITE) 1 MG tablet, TAKE 1 TABLET BY MOUTH EVERY DAY, Disp: 90 tablet, Rfl: 3 .  hydrocortisone 2.5 % cream, Apply topically 3 (three) times daily as needed., Disp: 28 g, Rfl: 3 .  ipratropium (ATROVENT) 0.06 % nasal spray, Place 1 spray into both nostrils daily. , Disp: , Rfl:  .  ketoconazole (NIZORAL) 2 % cream, , Disp: , Rfl:  .  metoprolol succinate (TOPROL-XL) 25 MG 24 hr tablet, TAKE 1 TABLET(25 MG) BY MOUTH DAILY, Disp: 90 tablet, Rfl: 2 .  predniSONE (DELTASONE) 5 MG tablet, Take 3 tablets (15 mg total) by mouth daily with breakfast. Wean as directed, Disp: 90 tablet, Rfl: 11 .  propranolol (INDERAL) 10 MG tablet, Take 1 tablet (10 mg total) by mouth 3 (three) times daily as needed (atrial fibrillation lasting more than 2 hours)., Disp: 10 tablet, Rfl: 6 .  rivaroxaban (XARELTO) 20 MG TABS tablet, TAKE 1 TABLET BY MOUTH EVERY DAY WITH EVENING MEAL, Disp: 90 tablet, Rfl: 1 .  rosuvastatin (CRESTOR) 5 MG tablet, Take 1 tablet (5 mg total) by mouth daily., Disp: 90 tablet, Rfl: 3 .  valsartan-hydrochlorothiazide (DIOVAN-HCT) 80-12.5 MG tablet, Take 0.5 tablets by mouth daily., Disp: 45 tablet, Rfl: 3 .  mirabegron ER (MYRBETRIQ) 25 MG TB24 tablet, Take 1 tablet (25 mg total) by mouth daily., Disp: 90 tablet, Rfl: 3 Allergies: Actonel [risedronate sodium], Boniva [ibandronic acid], Diclofenac, Fosamax [alendronate sodium], Lipitor [atorvastatin], Miacalcin [calcitonin (salmon)], Talwin [pentazocine], Latex, and Niaspan [niacin er]  Review of Systems  Constitutional: Negative for chills, fever and malaise/fatigue.  HENT: Negative for congestion, sinus pain and sore throat.   Eyes: Positive for redness. Negative for blurred vision and pain.    Respiratory: Positive for cough. Negative for wheezing.   Cardiovascular: Negative for chest pain and leg swelling.  Gastrointestinal: Negative for abdominal pain, constipation, diarrhea, heartburn, nausea and vomiting.  Genitourinary: Positive for frequency and urgency. Negative for dysuria and hematuria.  Musculoskeletal: Negative for back pain, joint pain, myalgias and neck pain.  Skin: Positive for itching. Negative for rash.  Neurological: Negative for dizziness, tremors and weakness.  Endo/Heme/Allergies: Does not bruise/bleed easily.  Psychiatric/Behavioral: Negative for depression. The patient is not nervous/anxious and does not have insomnia.     Objective: BP 122/80   Ht 5' 6.5" (1.689 m)  Wt 190 lb (86.2 kg)   BMI 30.21 kg/m   Filed Weights   01/18/20 0859  Weight: 190 lb (86.2 kg)   Body mass index is 30.21 kg/m. Physical Exam Constitutional:      General: She is not in acute distress.    Appearance: She is well-developed.  Genitourinary:     Pelvic exam was performed with patient supine.     Vagina and rectum normal.     No lesions in the vagina.     No vaginal bleeding.     No right or left adnexal mass present.     Right adnexa not tender.     Left adnexa not tender.     Genitourinary Comments: Absent Uterus Absent cervix Vaginal cuff well healed  HENT:     Head: Normocephalic and atraumatic. No laceration.     Right Ear: Hearing normal.     Left Ear: Hearing normal.     Mouth/Throat:     Pharynx: Uvula midline.  Eyes:     Pupils: Pupils are equal, round, and reactive to light.  Neck:     Thyroid: No thyromegaly.  Cardiovascular:     Rate and Rhythm: Normal rate and regular rhythm.     Heart sounds: No murmur. No friction rub. No gallop.   Pulmonary:     Effort: Pulmonary effort is normal. No respiratory distress.     Breath sounds: Normal breath sounds. No wheezing.  Chest:     Breasts:        Right: No mass, skin change or tenderness.         Left: No mass, skin change or tenderness.  Abdominal:     General: Bowel sounds are normal. There is no distension.     Palpations: Abdomen is soft.     Tenderness: There is no abdominal tenderness. There is no rebound.  Musculoskeletal:        General: Normal range of motion.     Cervical back: Normal range of motion and neck supple.  Neurological:     Mental Status: She is alert and oriented to person, place, and time.     Cranial Nerves: No cranial nerve deficit.  Skin:    General: Skin is warm and dry.  Psychiatric:        Judgment: Judgment normal.  Vitals reviewed.     Assessment: Annual Exam 1. Women's annual routine gynecological examination   2. Vaginal atrophy   3. Overactive bladder     Plan:            1.  Vaginal Screening-  Pap smear schedule reviewed with patient  2. Breast screening- Exam annually  Declines MMG  3. Colonoscopy declined  4. Labs managed by PCP  5. Counseling for hormonal therapy: no change in therapy today although she is unsure if new ins plan will cover Imvexxy.  Will send Rx to [facility] that is involved with this medicine to see if better covered.  If not, then consider Vagifem as next step, as pt would prefer supp not creams.  She is unsure of what is on formulary.  6. Urinary Incontinence is more urge than stress.  Counseled on behavioral, medicine, and Botox therapies.  Myrbetriq info and Rx given today.  Monitor for cost, side effects, efficacy over next 2-3 mos.              6. FRAX - FRAX score for assessing the 10 year probability for fracture calculated and  discussed today.  Based on age and score today, DEXA is not currently scheduled.    F/U  Return in about 1 year (around 01/17/2021) for Annual.  Barnett Applebaum, MD, [facility], [facility] 01/18/2020  9:30 AM

## 2020-01-25 ENCOUNTER — Other Ambulatory Visit: Payer: Self-pay | Admitting: Obstetrics & Gynecology

## 2020-03-16 ENCOUNTER — Telehealth: Payer: Self-pay | Admitting: Cardiovascular Disease

## 2020-03-16 NOTE — Telephone Encounter (Signed)
Agree with plan.  Nelva Bush, MD [facility]

## 2020-03-16 NOTE — Telephone Encounter (Signed)
Patient c/o Palpitations:  High priority if patient c/o lightheadedness, shortness of breath, or chest pain  1) How long have you had palpitations/irregular HR/ Afib? Are you having the symptoms now? Has been going on for about 8 hours, yes, having symptoms now. Has already taken her rescue medication around 2 am   Are you currently experiencing lightheadedness, SOB or CP? No  2) Do you have a history of afib (atrial fibrillation) or irregular heart rhythm? Yes Have you checked your BP or HR? (document readings if available): BP 157/100 HR 100, dropped down after Diltiazem 111/65 HR 105 Are you experiencing any other symptoms?no other symptoms,

## 2020-03-16 NOTE — Telephone Encounter (Signed)
Received incoming call from patient this morning. States she went back into aFib last night. Woke her up from her sleep around 11:30 pm and she took diltiazem 30 mg (expired) at that time. Went back to sleep and woke up around 1:30 am and took propranolol 10 mg (expired) as Dr Rockey Situ advised her to do about 2 hours later when this happens. Denies CP, SOB, palpitations, dizziness. She just knows her heart is out of rhythm because she's felt is before in the past when she was hospitalized. Her BP cuff show "irregular" heart beat. Most recent vital signs are 125/86, HR 65 (irregular per cuff).  She is taking her Xarelto and other medications as prescribed. She said her diltiazem and propranolol are expired. She should have prescriptions at her pharmacy to pick up. Advised her to get the prescriptions and take as prescribed but not to take too much to make sure to space out 3 times a day like prescription says. She is are to call 911 or seek emergency medical attention if she develops new or worsening symptoms. Advised her to call the on call provider if needed after hours for advice as well.  No openings today. Scheduled her to see Dr Rockey Situ on Monday, 03/19/20. Routing to DOD, Dr End, for review of the plan of care and any further recommendations.

## 2020-03-18 NOTE — Progress Notes (Signed)
Cardiology Office Note  Date:  03/19/2020   ID:  Jennifer Schmitt, DOB Feb 23, 1935, MRN [id number]  PCP:  Venia Carbon, MD   Chief Complaint  Patient presents with  . Follow-up    Pt. c/o shortness of breath & feels she is back in a-fib; symptoms started on 03/15/2020 and lasted for 13 hours. She has felt wiped out since then. Meds reviewed by the pt. verbally.     HPI:  Jennifer Schmitt is a very pleasant 85 year old woman with history of  paroxysmal atrial fibrillation,  obstructive sleep apnea who does not wear CPAP,  hypertension,  hyperlipidemia  who presents for routine followup of her atrial fibrillation. She spends much of her winter in [place], has a cardiologist there  1 week ago reports that she was working outside, next day long drive to see family, felt tired Developed atrial fibrillation, 13 hour stretch of atrial fib, last week 03/15/20 Rate was 100 bpm Took extra diltiazem and propanolol Took quite some time for to go away  Had episode of jan 2021 Lasted minutes Typically does not have episodes lasting for hours at a time  Bothered by polyuria, excess, for weeks On ARB/HCTZ  On prednisone for polymyalgia  History of chronic dizziness, vertigo symptoms.  EKG personally reviewed by myself on todays visit Shows normal sinus rhythm rate 64 bpm no significant ST or T wave changes  Family history Parents with no CAD, no MI Grandmother with CHF  Other past medical history reviewed Carotid u/s 2011: intimal thickening She reports that she had recent screening, was told she had no significant carotid disease. She will bring in the report for our review  05/31/2015 had severe vomiting, went to the emergency room Electrolytes were normal, no atrial fibrillation Lab work reviewed with her today from recent lab draw, total cholesterol 160 B-12 improving, sedimentation rate 40  She reports having an episode of atrial fibrillation in August 2015 It lasted  approximately 6 hours. She took diltiazem and propranolol.  she converted back to normal sinus rhythm   PMH:   has a past medical history of Adenomatous colon polyp, Allergic rhinitis due to pollen, Anemia, Anxiety, Bronchiectasis ([place]), Complication of anesthesia, Degenerative disc disease, Dysrhythmia, GERD (gastroesophageal reflux disease), Hearing loss in right ear, Heart murmur, HOH (hard of hearing), Hyperlipidemia, Hypertension, Osteoarthritis, multiple sites, PAF (paroxysmal atrial fibrillation) ([place]), PMR (polymyalgia rheumatica) ([facility]), PONV (postoperative nausea and vomiting), Rheumatic fever, Sleep apnea, Urge incontinence, Vertigo, and Vertigo.  PSH:    Past Surgical History:  Procedure Laterality Date  . ABDOMINAL HYSTERECTOMY    . ANKLE FRACTURE SURGERY Left 1996  . BACK SURGERY    . BREAST BIOPSY Left 2010   negative  . CATARACT EXTRACTION W/PHACO Left 10/05/2018   Procedure: CATARACT EXTRACTION PHACO AND INTRAOCULAR LENS PLACEMENT ([place]);  Surgeon: Birder Robson, MD;  Location: [facility] ORS;  Service: Ophthalmology;  Laterality: Left;  [place] 00:40 CDE 6.92 fluid pack lot # [id number]     . CATARACT EXTRACTION W/PHACO Right 10/26/2018   Procedure: CATARACT EXTRACTION PHACO AND INTRAOCULAR LENS PLACEMENT ([facility]);  Surgeon: Birder Robson, MD;  Location: [facility];  Service: Ophthalmology;  Laterality: Right;  [place]  00:47 CDE 7.11 Fluid pack lot # [id number]  . DEXA  11/09/06   normal  . FRACTURE SURGERY    . Meningitis  1963  . SPINE SURGERY    . SYNOVECTOMY Right 1973   elbow  . TONSILLECTOMY AND ADENOIDECTOMY      Current Outpatient  Medications  Medication Sig Dispense Refill  . Cholecalciferol (VITAMIN D) 2000 units CAPS Take 2,000 Units by mouth daily.    . cyanocobalamin (,VITAMIN B-12,) 1000 MCG/ML injection INJECT 1ML MONTHLY 3 mL 3  . diltiazem (CARDIZEM) 30 MG tablet Take 1 tablet (30 mg total) by mouth 3 (three) times daily as needed (atrial fibrillation lasting more  than 2 hours). 10 tablet 6  . Estradiol (IMVEXXY MAINTENANCE PACK) 4 MCG INST Place 1 ampule vaginally 2 (two) times a week. 24 each 3  . fluocinolone (VANOS) 0.01 % cream     . folic acid (FOLVITE) 1 MG tablet TAKE 1 TABLET BY MOUTH EVERY DAY 90 tablet 3  . hydrocortisone 2.5 % cream Apply topically 3 (three) times daily as needed. 28 g 3  . ipratropium (ATROVENT) 0.06 % nasal spray Place 1 spray into both nostrils daily.     Marland Kitchen ketoconazole (NIZORAL) 2 % cream     . metoprolol succinate (TOPROL-XL) 25 MG 24 hr tablet TAKE 1 TABLET(25 MG) BY MOUTH DAILY 90 tablet 2  . predniSONE (DELTASONE) 5 MG tablet Take 3 tablets (15 mg total) by mouth daily with breakfast. Wean as directed 90 tablet 11  . propranolol (INDERAL) 10 MG tablet Take 1 tablet (10 mg total) by mouth 3 (three) times daily as needed (atrial fibrillation lasting more than 2 hours). 10 tablet 6  . rivaroxaban (XARELTO) 20 MG TABS tablet TAKE 1 TABLET BY MOUTH EVERY DAY WITH EVENING MEAL 90 tablet 1  . rosuvastatin (CRESTOR) 5 MG tablet Take 1 tablet (5 mg total) by mouth daily. 90 tablet 3  . valsartan-hydrochlorothiazide (DIOVAN-HCT) 80-12.5 MG tablet Take 0.5 tablets by mouth daily. 45 tablet 3   No current facility-administered medications for this visit.    Allergies:   Actonel [risedronate sodium], Boniva [ibandronic acid], Diclofenac, Fosamax [alendronate sodium], Lipitor [atorvastatin], Miacalcin [calcitonin (salmon)], Talwin [pentazocine], Latex, and Niaspan [niacin er]   Social History:  The patient  reports that she has never smoked. She has never used smokeless tobacco. She reports current alcohol use. She reports that she does not use drugs.   Family History:   family history includes Arthritis in her father and mother; Breast cancer (age of onset: 63) in her paternal aunt; Cancer in her father; Colon cancer in her maternal grandmother; Diabetes in her father; Heart disease in her mother; Ovarian cancer in her maternal  grandmother; Rheum arthritis in her mother; Schizophrenia in her mother; Uterine cancer in her maternal grandmother.    Review of Systems: Review of Systems  Constitutional: Negative.   Respiratory: Negative.   Cardiovascular: Negative.   Gastrointestinal: Negative.   Musculoskeletal: Negative.   Neurological: Negative.   Psychiatric/Behavioral: Negative.   All other systems reviewed and are negative.   PHYSICAL EXAM: VS:  BP 138/68 (BP Location: Left Arm, Patient Position: Sitting, Cuff Size: Normal)   Pulse 64   Ht 5\' 6"  (1.676 m)   Wt 190 lb 8 oz (86.4 kg)   SpO2 98%   BMI 30.75 kg/m  , BMI Body mass index is 30.75 kg/m. Constitutional:  oriented to person, place, and time. No distress.  HENT:  Head: Grossly normal Eyes:  no discharge. No scleral icterus.  Neck: No JVD, no carotid bruits  Cardiovascular: Regular rate and rhythm, no murmurs appreciated Pulmonary/Chest: Clear to auscultation bilaterally, no wheezes or rails Abdominal: Soft.  no distension.  no tenderness.  Musculoskeletal: Normal range of motion Neurological:  normal muscle tone. Coordination normal. No  atrophy Skin: Skin warm and dry Psychiatric: normal affect, pleasant  Recent Labs: 11/09/2019: ALT 14; BUN 13; Creatinine, Ser 0.75; Hemoglobin 12.6; Platelets 242.0; Potassium 4.7; Sodium 140    Lipid Panel Lab Results  Component Value Date   CHOL 167 11/09/2019   HDL 54.20 11/09/2019   LDLCALC 93 11/09/2019   TRIG 101.0 11/09/2019      Wt Readings from Last 3 Encounters:  03/19/20 190 lb 8 oz (86.4 kg)  01/18/20 190 lb (86.2 kg)  12/06/19 189 lb (85.7 kg)     ASSESSMENT AND PLAN:  Paroxysmal atrial fibrillation ([place]) - Plan: EKG 12-Lead Recent episode lasting 13 hours Recommend she take 2 of her diltiazem pills as needed for breakthrough A. Fib, total of 60 mg After several hours could take 2 of her propranolol for total of 20 mg If she starts to have long frequent episodes may need  antiarrhythmic medication For now we will continue metoprolol succinate daily but may also consider increasing the metoprolol up to 37.5 mg daily  Essential hypertension - Plan: EKG 12-Lead Blood pressure is well controlled on today's visit. No changes made to the medications.  Pure hypercholesterolemia Tolerating Crestor 5 daily Numbers discussed  Encounter for anticoagulation discussion and counseling Tolerating anticoagulation Having paroxysmal atrial fibrillation spells   Total encounter time more than 25 minutes  Greater than 50% was spent in counseling and coordination of care with the patient  Disposition:   F/U  12 months   Orders Placed This Encounter  Procedures  . EKG 12-Lead     Signed, Esmond Plants, M.D., Ph.D. 03/19/2020  [place], [place]

## 2020-03-19 ENCOUNTER — Ambulatory Visit: Payer: Medicare PPO | Admitting: Cardiovascular Disease

## 2020-03-19 ENCOUNTER — Encounter: Payer: Self-pay | Admitting: Cardiovascular Disease

## 2020-03-19 ENCOUNTER — Other Ambulatory Visit: Payer: Self-pay

## 2020-03-19 VITALS — BP 138/68 | HR 64 | Ht 66.0 in | Wt 190.5 lb

## 2020-03-19 DIAGNOSIS — E782 Mixed hyperlipidemia: Secondary | ICD-10-CM

## 2020-03-19 DIAGNOSIS — I480 Paroxysmal atrial fibrillation: Secondary | ICD-10-CM

## 2020-03-19 DIAGNOSIS — I10 Essential (primary) hypertension: Secondary | ICD-10-CM | POA: Diagnosis not present

## 2020-03-19 MED ORDER — VALSARTAN 80 MG PO TABS: 80.0000 mg | ORAL_TABLET | Freq: Every day | ORAL | 3 refills | Status: DC

## 2020-03-19 MED ORDER — HYDROCHLOROTHIAZIDE 12.5 MG PO CAPS: 12.5000 mg | ORAL_CAPSULE | Freq: Every day | ORAL | 3 refills | Status: DC | PRN

## 2020-03-19 NOTE — Patient Instructions (Addendum)
Medication Instructions:  Split the valsartan HCTZ Take valsartan daily HCTZ only as needed  For atrial fib, Take diltiazem 1-2  followed by propranolol 1-2  If you need a refill on your cardiac medications before your next appointment, please call your pharmacy.    Lab work: No new labs needed   If you have labs (blood work) drawn today and your tests are completely normal, you will receive your results only by: Marland Kitchen MyChart Message (if you have MyChart) OR . A paper copy in the mail If you have any lab test that is abnormal or we need to change your treatment, we will call you to review the results.   Testing/Procedures: No new testing needed   Follow-Up: At [facility], you and your health needs are our priority.  As part of our continuing mission to provide you with exceptional heart care, we have created designated Provider Care Teams.  These Care Teams include your primary Cardiologist (physician) and Advanced Practice Providers (APPs -  Physician Assistants and Nurse Practitioners) who all work together to provide you with the care you need, when you need it.  . You will need a follow up appointment in 12 months Please call for long atrial fib spells  . Providers on your designated Care Team:   . Murray Hodgkins, NP . Christell Faith, PA-C . Marrianne Mood, PA-C  Any Other Special Instructions Will Be Listed Below (If Applicable).  For educational health videos Log in to : [email or web address] Or : [email or web address], password : [email or web address]

## 2020-06-06 ENCOUNTER — Ambulatory Visit (INDEPENDENT_AMBULATORY_CARE_PROVIDER_SITE_OTHER): Payer: Medicare PPO | Admitting: Internal Medicine

## 2020-06-06 ENCOUNTER — Encounter: Payer: Self-pay | Admitting: Internal Medicine

## 2020-06-06 ENCOUNTER — Other Ambulatory Visit: Payer: Self-pay

## 2020-06-06 VITALS — BP 122/80 | HR 68 | Temp 97.9°F | Ht 66.0 in | Wt 193.0 lb

## 2020-06-06 DIAGNOSIS — M353 Polymyalgia rheumatica: Secondary | ICD-10-CM

## 2020-06-06 DIAGNOSIS — I480 Paroxysmal atrial fibrillation: Secondary | ICD-10-CM

## 2020-06-06 DIAGNOSIS — F39 Unspecified mood [affective] disorder: Secondary | ICD-10-CM

## 2020-06-06 LAB — SEDIMENTATION RATE: Sed Rate: 30 mm/hr (ref 0–30)

## 2020-06-06 MED ORDER — PREDNISONE 5 MG PO TABS: 5.0000 mg | ORAL_TABLET | Freq: Every day | ORAL | 0 refills | Status: DC

## 2020-06-06 NOTE — Assessment & Plan Note (Signed)
Rare episodes On xarelto

## 2020-06-06 NOTE — Progress Notes (Signed)
Subjective:    Patient ID: Jennifer Schmitt, female    DOB: [birth year], 85 y.o.   MRN: [id number]  HPI Here for follow up of PMR This visit occurred during the SARS-CoV-2 public health emergency.  Safety protocols were in place, including screening questions prior to the visit, additional usage of staff PPE, and extensive cleaning of exam room while observing appropriate contact time as indicated for disinfecting solutions.   Has weaned down to 5mg  of prednisone daily No sig recurrence of muscle aching, mental cloudiness, etc  No heart problems Rarely feels an irregular heart beat No chest pain or SOB Needs to get back to regular exercise--plans to go back to new gym  Current Outpatient Medications on File Prior to Visit  Medication Sig Dispense Refill  . Cholecalciferol (VITAMIN D) 2000 units CAPS Take 2,000 Units by mouth daily.    . cyanocobalamin (,VITAMIN B-12,) 1000 MCG/ML injection INJECT 1ML MONTHLY 3 mL 3  . diltiazem (CARDIZEM) 30 MG tablet Take 1 tablet (30 mg total) by mouth 3 (three) times daily as needed (atrial fibrillation lasting more than 2 hours). 10 tablet 6  . Estradiol (IMVEXXY MAINTENANCE PACK) 4 MCG INST Place 1 ampule vaginally 2 (two) times a week. 24 each 3  . fluocinolone (VANOS) 0.01 % cream     . folic acid (FOLVITE) 1 MG tablet TAKE 1 TABLET BY MOUTH EVERY DAY 90 tablet 3  . hydrocortisone 2.5 % cream Apply topically 3 (three) times daily as needed. 28 g 3  . ipratropium (ATROVENT) 0.06 % nasal spray Place 1 spray into both nostrils daily.     Marland Kitchen ketoconazole (NIZORAL) 2 % cream     . metoprolol succinate (TOPROL-XL) 25 MG 24 hr tablet TAKE 1 TABLET(25 MG) BY MOUTH DAILY 90 tablet 2  . predniSONE (DELTASONE) 5 MG tablet Take 3 tablets (15 mg total) by mouth daily with breakfast. Wean as directed 90 tablet 11  . propranolol (INDERAL) 10 MG tablet Take 1 tablet (10 mg total) by mouth 3 (three) times daily as needed (atrial fibrillation lasting more than 2  hours). 10 tablet 6  . rivaroxaban (XARELTO) 20 MG TABS tablet TAKE 1 TABLET BY MOUTH EVERY DAY WITH EVENING MEAL 90 tablet 1  . rosuvastatin (CRESTOR) 5 MG tablet Take 1 tablet (5 mg total) by mouth daily. 90 tablet 3  . valsartan (DIOVAN) 80 MG tablet Take 1 tablet (80 mg total) by mouth daily. 90 tablet 3   No current facility-administered medications on file prior to visit.    Allergies  Allergen Reactions  . Actonel [Risedronate Sodium] Nausea And Vomiting  . Boniva [Ibandronic Acid] Nausea And Vomiting  . Diclofenac Other (See Comments)    Voltaren Gel aggravated her sinus passages  . Fosamax [Alendronate Sodium] Nausea And Vomiting  . Lipitor [Atorvastatin] Other (See Comments)    Muscle aches. Tolerates Crestor.  Dion Saucier [Calcitonin ([place])] Other (See Comments)    Sneezing   . Talwin [Pentazocine] Other (See Comments)    Hallucinations   . Latex Rash    Sneezing, watery eyes.  . Niaspan [Niacin Er] Swelling and Rash    Past Medical History:  Diagnosis Date  . Adenomatous colon polyp   . Allergic rhinitis due to pollen   . Anemia   . Anxiety   . Bronchiectasis ([employer])   . Complication of anesthesia   . Degenerative disc disease    cervical and lumbar  . Dysrhythmia   . GERD (gastroesophageal reflux  disease)   . Hearing loss in right ear   . Heart murmur   . HOH (hard of hearing)    AIDS  . Hyperlipidemia   . Hypertension   . Osteoarthritis, multiple sites   . PAF (paroxysmal atrial fibrillation) ([place])    a. 01/2013 Echo [facility]): EF 60-65%, no rwma, mildly dil LA/RA, mild AI/TR, trace MR, nl RV size/fxn, mild PAH; b. CHA2DS2VASc = 4-->Chronic Xarelto.  Marland Kitchen PMR (polymyalgia rheumatica) ([facility])   . PONV (postoperative nausea and vomiting)   . Rheumatic fever   . Sleep apnea    NO CPAP  . Urge incontinence   . Vertigo   . Vertigo     Past Surgical History:  Procedure Laterality Date  . ABDOMINAL HYSTERECTOMY    . ANKLE FRACTURE SURGERY Left  1996  . BACK SURGERY    . BREAST BIOPSY Left 2010   negative  . CATARACT EXTRACTION W/PHACO Left 10/05/2018   Procedure: CATARACT EXTRACTION PHACO AND INTRAOCULAR LENS PLACEMENT ([place]);  Surgeon: Birder Robson, MD;  Location: [facility];  Service: Ophthalmology;  Laterality: Left;  [place] 00:40 CDE 6.92 fluid pack lot # [id number]     . CATARACT EXTRACTION W/PHACO Right 10/26/2018   Procedure: CATARACT EXTRACTION PHACO AND INTRAOCULAR LENS PLACEMENT (IOC);  Surgeon: Birder Robson, MD;  Location: [facility];  Service: Ophthalmology;  Laterality: Right;  [place]  00:47 CDE 7.11 Fluid pack lot # [id number]  . DEXA  11/09/06   normal  . FRACTURE SURGERY    . Meningitis  1963  . SPINE SURGERY    . SYNOVECTOMY Right 1973   elbow  . TONSILLECTOMY AND ADENOIDECTOMY      Family History  Problem Relation Age of Onset  . Schizophrenia Mother        paranoid  . Heart disease Mother        from psych meds  . Arthritis Mother   . Rheum arthritis Mother   . Cancer Father        prostate  . Arthritis Father   . Diabetes Father   . Ovarian cancer Maternal Grandmother   . Uterine cancer Maternal Grandmother   . Colon cancer Maternal Grandmother   . Breast cancer Paternal Aunt 73    Social History   Socioeconomic History  . Marital status: Married    Spouse name: Not on file  . Number of children: 1  . Years of education: Not on file  . Highest education level: Not on file  Occupational History  . Occupation: Scientist, physiological and college professor    Comment: [employer]--PhD in Special education  Tobacco Use  . Smoking status: Never Smoker  . Smokeless tobacco: Never Used  Vaping Use  . Vaping Use: Never used  Substance and Sexual Activity  . Alcohol use: Yes    Comment: OCCAS  . Drug use: No  . Sexual activity: Not on file  Other Topics Concern  . Not on file  Social History Narrative   Retired 2007    1 son in [place] area   Spends part time in [place]      Has living will    Husband then son is health care POA   Not sure about DNR---will keep open resuscitation for now    No tube feedings if cognitively unaware               Social Determinants of Health   Financial Resource Strain:   . Difficulty of Paying Living Expenses:  Food Insecurity:   . Worried About Charity fundraiser in the Last Year:   . Arboriculturist in the Last Year:   Transportation Needs:   . Film/video editor (Medical):   Marland Kitchen Lack of Transportation (Non-Medical):   Physical Activity:   . Days of Exercise per Week:   . Minutes of Exercise per Session:   Stress:   . Feeling of Stress :   Social Connections:   . Frequency of Communication with Friends and Family:   . Frequency of Social Gatherings with Friends and Family:   . Attends Religious Services:   . Active Member of Clubs or Organizations:   . Attends Archivist Meetings:   Marland Kitchen Marital Status:   Intimate Partner Violence:   . Fear of Current or Ex-Partner:   . Emotionally Abused:   Marland Kitchen Physically Abused:   . Sexually Abused:    Review of Systems Has episodic tendonitis type pains, rib pains, etc Still some short term memory issues Husband diagnosed with Alzheimers--not doing well (refuses to exercise, get out, etc)    Objective:   Physical Exam  Cardiovascular: Normal rate and regular rhythm. Exam reveals no gallop.  No murmur heard. Respiratory: Effort normal and breath sounds normal. She has no wheezes. She has no rales.  GI: Normal appearance.  Neurological: She is alert.  Skin:  Small nodule on left shoulder ---from tick bite (no apparent retained parts or inflammation)  Psychiatric: Her behavior is normal. Mood normal.           Assessment & Plan:

## 2020-06-06 NOTE — Assessment & Plan Note (Signed)
Stress with husband's Alzheimer's, etc No MDD No meds for now

## 2020-06-06 NOTE — Assessment & Plan Note (Signed)
In remission clinically Down to 5mg  prednisone daily Will recheck sed rate

## 2020-06-13 DIAGNOSIS — H353131 Nonexudative age-related macular degeneration, bilateral, early dry stage: Secondary | ICD-10-CM | POA: Diagnosis not present

## 2020-08-13 DIAGNOSIS — J300 Vasomotor rhinitis: Secondary | ICD-10-CM | POA: Diagnosis not present

## 2020-08-13 DIAGNOSIS — J309 Allergic rhinitis, unspecified: Secondary | ICD-10-CM | POA: Diagnosis not present

## 2020-08-15 ENCOUNTER — Other Ambulatory Visit: Payer: Self-pay | Admitting: Internal Medicine

## 2020-08-15 ENCOUNTER — Other Ambulatory Visit: Payer: Self-pay | Admitting: Cardiovascular Disease

## 2020-09-28 ENCOUNTER — Telehealth: Payer: Self-pay | Admitting: Cardiovascular Disease

## 2020-09-28 NOTE — Telephone Encounter (Signed)
See recent MyChart message. Message was routed to Dr. Rockey Situ on 10/5. Ok to fill Rx for Xarelto?

## 2020-09-28 NOTE — Telephone Encounter (Signed)
*  STAT* If patient is at the pharmacy, call can be transferred to refill team.   1. Which medications need to be refilled? (please list name of each medication and dose if known) Xarelto 20 mg  2. Which pharmacy/location (including street and city if local pharmacy) is medication to be sent to?[facility] at [address]  3. Do they need a 30 day or 90 day supply? [place]

## 2020-10-01 MED ORDER — RIVAROXABAN 20 MG PO TABS: ORAL_TABLET | ORAL | 3 refills | Status: DC

## 2020-10-01 NOTE — Telephone Encounter (Signed)
Refills sent in since Dr. Rockey Situ has signed for these before. If she has any questions please let me know. Thanks

## 2020-10-07 NOTE — Telephone Encounter (Signed)
We can refill Is she going to see Dr. Silvio Pate for yearly labs? If not, we need a CBC

## 2020-10-15 DIAGNOSIS — D1801 Hemangioma of skin and subcutaneous tissue: Secondary | ICD-10-CM | POA: Diagnosis not present

## 2020-10-15 DIAGNOSIS — L308 Other specified dermatitis: Secondary | ICD-10-CM | POA: Diagnosis not present

## 2020-10-15 DIAGNOSIS — L981 Factitial dermatitis: Secondary | ICD-10-CM | POA: Diagnosis not present

## 2020-10-15 DIAGNOSIS — L821 Other seborrheic keratosis: Secondary | ICD-10-CM | POA: Diagnosis not present

## 2020-10-15 DIAGNOSIS — L570 Actinic keratosis: Secondary | ICD-10-CM | POA: Diagnosis not present

## 2020-10-15 DIAGNOSIS — D225 Melanocytic nevi of trunk: Secondary | ICD-10-CM | POA: Diagnosis not present

## 2020-10-26 ENCOUNTER — Telehealth: Payer: Self-pay

## 2020-10-26 NOTE — Telephone Encounter (Signed)
Jennifer Schmitt, registered Facilities manager, calling; pt needs refill of Imvexxy 107mcg.  Fax # [phone]

## 2020-10-30 DIAGNOSIS — Z20822 Contact with and (suspected) exposure to covid-19: Secondary | ICD-10-CM | POA: Diagnosis not present

## 2020-11-14 ENCOUNTER — Ambulatory Visit: Payer: Medicare Other | Admitting: Internal Medicine

## 2020-11-21 ENCOUNTER — Ambulatory Visit: Payer: Medicare PPO | Admitting: Internal Medicine

## 2020-11-21 ENCOUNTER — Other Ambulatory Visit: Payer: Self-pay

## 2020-11-21 ENCOUNTER — Encounter: Payer: Self-pay | Admitting: Internal Medicine

## 2020-11-21 VITALS — BP 132/80 | HR 62 | Temp 97.7°F | Ht 65.5 in | Wt 192.0 lb

## 2020-11-21 DIAGNOSIS — I10 Essential (primary) hypertension: Secondary | ICD-10-CM

## 2020-11-21 DIAGNOSIS — M353 Polymyalgia rheumatica: Secondary | ICD-10-CM | POA: Diagnosis not present

## 2020-11-21 DIAGNOSIS — Z0000 Encounter for general adult medical examination without abnormal findings: Secondary | ICD-10-CM

## 2020-11-21 DIAGNOSIS — F39 Unspecified mood [affective] disorder: Secondary | ICD-10-CM | POA: Diagnosis not present

## 2020-11-21 DIAGNOSIS — I480 Paroxysmal atrial fibrillation: Secondary | ICD-10-CM

## 2020-11-21 DIAGNOSIS — Z7189 Other specified counseling: Secondary | ICD-10-CM

## 2020-11-21 DIAGNOSIS — G3184 Mild cognitive impairment, so stated: Secondary | ICD-10-CM

## 2020-11-21 LAB — COMPREHENSIVE METABOLIC PANEL
ALT: 12 U/L (ref 0–35)
AST: 14 U/L (ref 0–37)
Albumin: 4.1 g/dL (ref 3.5–5.2)
Alkaline Phosphatase: 59 U/L (ref 39–117)
BUN: 15 mg/dL (ref 6–23)
CO2: 33 mEq/L — ABNORMAL HIGH (ref 19–32)
Calcium: 9 mg/dL (ref 8.4–10.5)
Chloride: 101 mEq/L (ref 96–112)
Creatinine, Ser: 0.85 mg/dL (ref 0.40–1.20)
GFR: 63.45 mL/min (ref >60.00)
Glucose, Bld: 84 mg/dL (ref 70–99)
Potassium: 4.6 mEq/L (ref 3.5–5.1)
Sodium: 140 mEq/L (ref 135–145)
Total Bilirubin: 0.6 mg/dL (ref 0.2–1.2)
Total Protein: 6.9 g/dL (ref 6.0–8.3)

## 2020-11-21 LAB — LIPID PANEL
Cholesterol: 182 mg/dL (ref 0–200)
HDL: 72.7 mg/dL (ref >39.00)
LDL Cholesterol: 84 mg/dL (ref 0–99)
NonHDL: 109.16
Total CHOL/HDL Ratio: 3
Triglycerides: 128 mg/dL (ref 0.0–149.0)
VLDL: 25.6 mg/dL (ref 0.0–40.0)

## 2020-11-21 LAB — CBC
HCT: 37.7 % (ref 36.0–46.0)
Hemoglobin: 12.4 g/dL (ref 12.0–15.0)
MCHC: 33 g/dL (ref 30.0–36.0)
MCV: 96.7 fl (ref 78.0–100.0)
Platelets: 220 10*3/uL (ref 150.0–400.0)
RBC: 3.89 Mil/uL (ref 3.87–5.11)
RDW: 14.5 % (ref 11.5–15.5)
WBC: 5.3 10*3/uL (ref 4.0–10.5)

## 2020-11-21 LAB — SEDIMENTATION RATE: Sed Rate: 21 mm/hr (ref 0–30)

## 2020-11-21 LAB — T4, FREE: Free T4: 0.75 ng/dL (ref 0.60–1.60)

## 2020-11-21 MED ORDER — ROSUVASTATIN CALCIUM 5 MG PO TABS: 5.0000 mg | ORAL_TABLET | ORAL | 0 refills | Status: DC

## 2020-11-21 NOTE — Assessment & Plan Note (Signed)
See social history 

## 2020-11-21 NOTE — Patient Instructions (Signed)
Please try off the rosuvastatin for 1-2 months to see if it helps your memory. If no change, go back on it.

## 2020-11-21 NOTE — Progress Notes (Signed)
Subjective:    Patient ID: Jennifer Schmitt, female    DOB: 1935-01-05, 85 y.o.   MRN: [id number]  HPI Here for Medicare wellness visit and follow up of chronic health conditions This visit occurred during the SARS-CoV-2 public health emergency.  Safety protocols were in place, including screening questions prior to the visit, additional usage of staff PPE, and extensive cleaning of exam room while observing appropriate contact time as indicated for disinfecting solutions.   Reviewed form and advanced directives Reviewed other doctors Does regular exercise---the cold is affecting her though (and her ankle injury) Vision is okay Only hears in left ear--doesn't use the hearing aides (didn't help) Occasional glass of wine No tobacco No falls No depression or anhedonia Independent with instrumental ADLs Ongoing issues with losing things--short term only. No sig difference  Having a lot of stress Husband formally diagnosed with Alzheimers Needs reminders to take shower, does tend to wear the same clothes, mostly continent.  No agitation Sleeps okay---doesn't keep her up Not ready for the day program  Tripped without falling a few weeks Some pain along lateral left ankle Does have fixation plate above that ankle from past fracture  Continues on prednisone for PMR Has weaned down to 5mg  three to four times a week  Will have a rare palpitation---hasn't needed rescue meds in months No chest pain or SOB No dizziness or syncope No edema  Having allergy symptoms Using xyzal and nasal sprays per Dr Tami Ribas  Current Outpatient Medications on File Prior to Visit  Medication Sig Dispense Refill  . Cholecalciferol (VITAMIN D) 2000 units CAPS Take 2,000 Units by mouth daily.    . cyanocobalamin (,VITAMIN B-12,) 1000 MCG/ML injection INJECT 1ML MONTHLY 3 mL 3  . diltiazem (CARDIZEM) 30 MG tablet Take 1 tablet (30 mg total) by mouth 3 (three) times daily as needed (atrial fibrillation  lasting more than 2 hours). 10 tablet 6  . Estradiol (IMVEXXY MAINTENANCE PACK) 4 MCG INST Place 1 ampule vaginally 2 (two) times a week. 24 each 3  . fluocinolone (VANOS) 0.01 % cream     . folic acid (FOLVITE) 1 MG tablet TAKE 1 TABLET BY MOUTH EVERY DAY 90 tablet 3  . hydrocortisone 2.5 % cream Apply topically 3 (three) times daily as needed. 28 g 3  . ipratropium (ATROVENT) 0.06 % nasal spray Place 1 spray into both nostrils daily.     Marland Kitchen ketoconazole (NIZORAL) 2 % cream     . metoprolol succinate (TOPROL-XL) 25 MG 24 hr tablet TAKE 1 TABLET(25 MG) BY MOUTH DAILY 90 tablet 1  . predniSONE (DELTASONE) 5 MG tablet Take 1 tablet (5 mg total) by mouth daily with breakfast. Wean as directed (Patient taking differently: Take 5 mg by mouth daily with breakfast. Wean as directed. About 3-4 a week) 1 tablet 0  . propranolol (INDERAL) 10 MG tablet Take 1 tablet (10 mg total) by mouth 3 (three) times daily as needed (atrial fibrillation lasting more than 2 hours). 10 tablet 6  . rivaroxaban (XARELTO) 20 MG TABS tablet TAKE 1 TABLET BY MOUTH EVERY DAY WITH EVENING MEAL 90 tablet 3  . valsartan (DIOVAN) 80 MG tablet Take 1 tablet (80 mg total) by mouth daily. 90 tablet 3   No current facility-administered medications on file prior to visit.    Allergies  Allergen Reactions  . Actonel [Risedronate Sodium] Nausea And Vomiting  . Boniva [Ibandronic Acid] Nausea And Vomiting  . Diclofenac Other (See Comments)  Voltaren Gel aggravated her sinus passages  . Fosamax [Alendronate Sodium] Nausea And Vomiting  . Lipitor [Atorvastatin] Other (See Comments)    Muscle aches. Tolerates Crestor.  Dion Saucier [Calcitonin ([place])] Other (See Comments)    Sneezing   . Talwin [Pentazocine] Other (See Comments)    Hallucinations   . Latex Rash    Sneezing, watery eyes.  . Niaspan [Niacin Er] Swelling and Rash    Past Medical History:  Diagnosis Date  . Adenomatous colon polyp   . Allergic rhinitis due to  pollen   . Anemia   . Anxiety   . Bronchiectasis ([place])   . Complication of anesthesia   . Degenerative disc disease    cervical and lumbar  . Dysrhythmia   . GERD (gastroesophageal reflux disease)   . Hearing loss in right ear   . Heart murmur   . HOH (hard of hearing)    AIDS  . Hyperlipidemia   . Hypertension   . Osteoarthritis, multiple sites   . PAF (paroxysmal atrial fibrillation) ([place])    a. 01/2013 Echo [facility] ([place] EF 60-65%, no rwma, mildly dil LA/RA, mild AI/TR, trace MR, nl RV size/fxn, mild PAH; b. CHA2DS2VASc = 4-->Chronic Xarelto.  Marland Kitchen PMR (polymyalgia rheumatica) (H[facility])   . PONV (postoperative nausea and vomiting)   . Rheumatic fever   . Sleep apnea    NO CPAP  . Urge incontinence   . Vertigo   . Vertigo     Past Surgical History:  Procedure Laterality Date  . ABDOMINAL HYSTERECTOMY    . ANKLE FRACTURE SURGERY Left 1996  . BACK SURGERY    . BREAST BIOPSY Left 2010   negative  . CATARACT EXTRACTION W/PHACO Left 10/05/2018   Procedure: CATARACT EXTRACTION PHACO AND INTRAOCULAR LENS PLACEMENT ([place]);  Surgeon: Birder Robson, MD;  Location: [facility] ORS;  Service: Ophthalmology;  Laterality: Left;  [place] 00:40 CDE 6.92 fluid pack lot # [id number]     . CATARACT EXTRACTION W/PHACO Right 10/26/2018   Procedure: CATARACT EXTRACTION PHACO AND INTRAOCULAR LENS PLACEMENT ([facility]);  Surgeon: Birder Robson, MD;  Location: [facility] ORS;  Service: Ophthalmology;  Laterality: Right;  [place]  00:47 CDE 7.11 Fluid pack lot # [id number]  . DEXA  11/09/06   normal  . FRACTURE SURGERY    . Meningitis  1963  . SPINE SURGERY    . SYNOVECTOMY Right 1973   elbow  . TONSILLECTOMY AND ADENOIDECTOMY      Family History  Problem Relation Age of Onset  . Schizophrenia Mother        paranoid  . Heart disease Mother        from psych meds  . Arthritis Mother   . Rheum arthritis Mother   . Cancer Father        prostate  . Arthritis Father   . Diabetes Father   . Ovarian cancer  Maternal Grandmother   . Uterine cancer Maternal Grandmother   . Colon cancer Maternal Grandmother   . Breast cancer Paternal Aunt 19    Social History   Socioeconomic History  . Marital status: Married    Spouse name: Not on file  . Number of children: 1  . Years of education: Not on file  . Highest education level: Not on file  Occupational History  . Occupation: Scientist, physiological and college professor    Comment: [employer]--PhD in Special education  Tobacco Use  . Smoking status: Never Smoker  . Smokeless tobacco: Never Used  Vaping Use  .  Vaping Use: Never used  Substance and Sexual Activity  . Alcohol use: Yes    Comment: OCCAS  . Drug use: No  . Sexual activity: Not on file  Other Topics Concern  . Not on file  Social History Narrative   Retired 2007    1 son in [place] area   Spends part time in [place]      Has living will   Husband then son is health care POA   Not sure about DNR---will keep open resuscitation for now    No tube feedings if cognitively unaware               Social Determinants of Health   Financial Resource Strain:   . Difficulty of Paying Living Expenses: Not on file  Food Insecurity:   . Worried About Charity fundraiser in the Last Year: Not on file  . Ran Out of Food in the Last Year: Not on file  Transportation Needs:   . Lack of Transportation (Medical): Not on file  . Lack of Transportation (Non-Medical): Not on file  Physical Activity:   . Days of Exercise per Week: Not on file  . Minutes of Exercise per Session: Not on file  Stress:   . Feeling of Stress : Not on file  Social Connections:   . Frequency of Communication with Friends and Family: Not on file  . Frequency of Social Gatherings with Friends and Family: Not on file  . Attends Religious Services: Not on file  . Active Member of Clubs or Organizations: Not on file  . Attends Archivist Meetings: Not on file  . Marital Status: Not on file  Intimate  Partner Violence:   . Fear of Current or Ex-Partner: Not on file  . Emotionally Abused: Not on file  . Physically Abused: Not on file  . Sexually Abused: Not on file   Review of Systems Appetite is fine Weight is stable Sleeps fair---up and down with voiding. Uses estrogen suppository per gyn Wears seat belt Teeth are fine--keeps up with dentist No suspicious skin lesions---recent check (left arm lesion on observation) Occasional heartburn--pepcid helps No dysphagia Bowels are fine. No blood. Occ urgency/incontinence Has typical aches. Some pain in left hip at times. Tylenol prn    Objective:   Physical Exam Constitutional:      Appearance: Normal appearance.  HENT:     Mouth/Throat:     Comments: No lesions Eyes:     Conjunctiva/sclera: Conjunctivae normal.     Pupils: Pupils are equal, round, and reactive to light.  Cardiovascular:     Rate and Rhythm: Normal rate and regular rhythm.     Pulses: Normal pulses.     Heart sounds: No murmur heard.  No gallop.      Comments: occ skips Pulmonary:     Effort: Pulmonary effort is normal.     Breath sounds: Normal breath sounds. No wheezing or rales.  Abdominal:     Palpations: Abdomen is soft.     Tenderness: There is no abdominal tenderness.  Musculoskeletal:     Cervical back: Neck supple.     Right lower leg: No edema.     Comments: Trace edema in left ankle. No sig tenderness and ROM is okay Mild pain with left hip internal rotation--but ROM fair  Lymphadenopathy:     Cervical: No cervical adenopathy.  Skin:    General: Skin is warm.     Findings: No rash.  Neurological:  Mental Status: She is alert and oriented to person, place, and time.     Comments: President---"Joe Biden, Johnnette Litter Trump" 100-93-? D-l-r-o-w Recall 2/3  Psychiatric:        Mood and Affect: Mood normal.        Behavior: Behavior normal.            Assessment & Plan:

## 2020-11-21 NOTE — Assessment & Plan Note (Signed)
BP Readings from Last 3 Encounters:  11/21/20 132/80  06/06/20 122/80  03/19/20 138/68   Okay on valsartan and metoprolol

## 2020-11-21 NOTE — Assessment & Plan Note (Signed)
No apparent progression Discussed trying off the statin for 1-2 months to see if that helps

## 2020-11-21 NOTE — Assessment & Plan Note (Signed)
Paroxysmal No recent flares Is on the xarelto

## 2020-11-21 NOTE — Assessment & Plan Note (Signed)
Stress/anxiety with husband's dementia Discussed functional issues No medications

## 2020-11-21 NOTE — Assessment & Plan Note (Signed)
I have personally reviewed the Medicare Annual Wellness questionnaire and have noted 1. The patient's medical and social history 2. Their use of alcohol, tobacco or illicit drugs 3. Their current medications and supplements 4. The patient's functional ability including ADL's, fall risks, home safety risks and hearing or visual             impairment. 5. Diet and physical activities 6. Evidence for depression or mood disorders  The patients weight, height, BMI and visual acuity have been recorded in the chart I have made referrals, counseling and provided education to the patient based review of the above and I have provided the pt with a written personalized care plan for preventive services.  I have provided you with a copy of your personalized plan for preventive services. Please take the time to review along with your updated medication list.  Had COVID booster and flu vaccine No cancer screening due to age Discussed exercise----needs to be more regular with this

## 2020-11-21 NOTE — Progress Notes (Signed)
Hearing Screening   125Hz  250Hz  500Hz  1000Hz  2000Hz  3000Hz  4000Hz  6000Hz  8000Hz   Right ear:           Left ear:           Comments: Had hearing aids. Not wearing them any longer.  Vision Screening Comments: June 2021

## 2020-11-21 NOTE — Assessment & Plan Note (Signed)
Doing well now on prednisone 5mg  about 4 times a week

## 2020-12-03 ENCOUNTER — Other Ambulatory Visit: Payer: Self-pay | Admitting: Cardiovascular Disease

## 2020-12-03 NOTE — Telephone Encounter (Signed)
Pt taking Crestor 5 mg qd. Please advise if ok to refill Crestor last filled by PCP.

## 2021-01-18 ENCOUNTER — Encounter: Payer: Self-pay | Admitting: Obstetrics & Gynecology

## 2021-01-18 ENCOUNTER — Other Ambulatory Visit: Payer: Self-pay

## 2021-01-18 ENCOUNTER — Ambulatory Visit (INDEPENDENT_AMBULATORY_CARE_PROVIDER_SITE_OTHER): Payer: Medicare PPO | Admitting: Obstetrics & Gynecology

## 2021-01-18 VITALS — BP 138/80 | Ht 66.5 in | Wt 194.0 lb

## 2021-01-18 DIAGNOSIS — N952 Postmenopausal atrophic vaginitis: Secondary | ICD-10-CM | POA: Diagnosis not present

## 2021-01-18 DIAGNOSIS — N3281 Overactive bladder: Secondary | ICD-10-CM

## 2021-01-18 MED ORDER — IMVEXXY MAINTENANCE PACK 4 MCG VA INST: 1.0000 | VAGINAL_INSERT | VAGINAL | 3 refills | Status: DC

## 2021-01-18 NOTE — Progress Notes (Signed)
History of Present Illness:  Jennifer Schmitt is a 86 y.o. who was started on Imvexxy vag ERT for vag dryness ad discomfort approximately 1 year ago. Since that time, she states that her symptoms are improving.  She also describes urinary urgency and frequency, with 10-12 voids per day and some nocturia.  She leaks at times if doesn't go quick enough.  Other times, she has urge to go and is unable to void.  She has tried Myrbetriq but did not tolerate well at all.  No UTI.  PMHx: She  has a past medical history of Adenomatous colon polyp, Allergic rhinitis due to pollen, Anemia, Anxiety, Bronchiectasis ([place]), Complication of anesthesia, Degenerative disc disease, Dysrhythmia, GERD (gastroesophageal reflux disease), Hearing loss in right ear, Heart murmur, HOH (hard of hearing), Hyperlipidemia, Hypertension, Osteoarthritis, multiple sites, PAF (paroxysmal atrial fibrillation) ([place]), PMR (polymyalgia rheumatica) ([facility]), PONV (postoperative nausea and vomiting), Rheumatic fever, Sleep apnea, Urge incontinence, Vertigo, and Vertigo. Also,  has a past surgical history that includes Tonsillectomy and adenoidectomy; Meningitis (1963); Abdominal hysterectomy; Breast biopsy (Left, 2010); Ankle fracture surgery (Left, 1996); Synovectomy (Right, 1973); Spine surgery; DEXA (11/09/06); Back surgery; Fracture surgery; Cataract extraction w/PHACO (Left, 10/05/2018); and Cataract extraction w/PHACO (Right, 10/26/2018)., family history includes Arthritis in her father and mother; Breast cancer (age of onset: 14) in her paternal aunt; Cancer in her father; Colon cancer in her maternal grandmother; Diabetes in her father; Heart disease in her mother; Ovarian cancer in her maternal grandmother; Rheum arthritis in her mother; Schizophrenia in her mother; Uterine cancer in her maternal grandmother.,  reports that she has never smoked. She has never used smokeless tobacco. She reports current alcohol use. She reports that she does  not use drugs. Current Meds  Medication Sig  . Cholecalciferol (VITAMIN D) 2000 units CAPS Take 2,000 Units by mouth daily.  . cyanocobalamin (,VITAMIN B-12,) 1000 MCG/ML injection INJECT 1ML MONTHLY  . diltiazem (CARDIZEM) 30 MG tablet Take 1 tablet (30 mg total) by mouth 3 (three) times daily as needed (atrial fibrillation lasting more than 2 hours).  . fluocinolone (VANOS) 7.34 % cream   . folic acid (FOLVITE) 1 MG tablet TAKE 1 TABLET BY MOUTH EVERY DAY  . hydrocortisone 2.5 % cream Apply topically 3 (three) times daily as needed.  Marland Kitchen ipratropium (ATROVENT) 0.06 % nasal spray Place 1 spray into both nostrils daily.   Marland Kitchen ketoconazole (NIZORAL) 2 % cream   . metoprolol succinate (TOPROL-XL) 25 MG 24 hr tablet TAKE 1 TABLET(25 MG) BY MOUTH DAILY  . predniSONE (DELTASONE) 5 MG tablet Take 1 tablet (5 mg total) by mouth daily with breakfast. Wean as directed (Patient taking differently: Take 5 mg by mouth daily with breakfast. Wean as directed. About 3-4 a week)  . propranolol (INDERAL) 10 MG tablet Take 1 tablet (10 mg total) by mouth 3 (three) times daily as needed (atrial fibrillation lasting more than 2 hours).  . rivaroxaban (XARELTO) 20 MG TABS tablet TAKE 1 TABLET BY MOUTH EVERY DAY WITH EVENING MEAL  . rosuvastatin (CRESTOR) 5 MG tablet TAKE 1 TABLET(5 MG) BY MOUTH DAILY  . valsartan (DIOVAN) 80 MG tablet Take 1 tablet (80 mg total) by mouth daily.  . [DISCONTINUED] Estradiol (IMVEXXY MAINTENANCE PACK) 4 MCG INST Place 1 ampule vaginally 2 (two) times a week.  . Also, is allergic to actonel [risedronate sodium], boniva [ibandronic acid], diclofenac, fosamax [alendronate sodium], lipitor [atorvastatin], miacalcin [calcitonin ([place])], talwin [pentazocine], latex, and niaspan [niacin er]..  Review of Systems  Constitutional: Negative for chills, fever and malaise/fatigue.  HENT: Negative for congestion, sinus pain and sore throat.   Eyes: Negative for blurred vision and pain.   Respiratory: Negative for cough and wheezing.   Cardiovascular: Negative for chest pain and leg swelling.  Gastrointestinal: Positive for constipation. Negative for abdominal pain, diarrhea, heartburn, nausea and vomiting.  Genitourinary: Positive for frequency and urgency. Negative for dysuria and hematuria.  Musculoskeletal: Positive for joint pain. Negative for back pain, myalgias and neck pain.  Skin: Negative for itching and rash.  Neurological: Positive for weakness. Negative for dizziness and tremors.  Endo/Heme/Allergies: Does not bruise/bleed easily.  Psychiatric/Behavioral: Negative for depression. The patient is not nervous/anxious and does not have insomnia.     Physical Exam:  BP 138/80   Ht 5' 6.5" (1.689 m)   Wt 194 lb (88 kg)   BMI 30.84 kg/m  Body mass index is 30.84 kg/m. Physical Exam Constitutional:      General: She is not in acute distress.    Appearance: She is well-developed.  Genitourinary:     Vulva, urethra, bladder, vagina and rectum normal.     No lesions in the vagina.     Genitourinary Comments: Vaginal cuff well healed     Right Labia: No rash or tenderness.    Left Labia: No tenderness or rash.    No vaginal bleeding.     Anterior vaginal prolapse present.    Mild vaginal atrophy present.    Vaginal exam comments: Min cystlcele.      Right Adnexa: not tender and no mass present.    Left Adnexa: not tender and no mass present.    Cervix is absent.     Uterus is absent.     Pelvic exam was performed with patient supine.  Breasts:     Right: No mass, skin change or tenderness.     Left: No mass, skin change or tenderness.    HENT:     Head: Normocephalic and atraumatic. No laceration.     Right Ear: Hearing normal.     Left Ear: Hearing normal.     Mouth/Throat:     Pharynx: Uvula midline.  Eyes:     Pupils: Pupils are equal, round, and reactive to light.  Neck:     Thyroid: No thyromegaly.  Cardiovascular:     Rate and Rhythm:  Normal rate and regular rhythm.     Heart sounds: No murmur heard. No friction rub. No gallop.   Pulmonary:     Effort: Pulmonary effort is normal. No respiratory distress.     Breath sounds: Normal breath sounds. No wheezing.  Abdominal:     General: Bowel sounds are normal. There is no distension.     Palpations: Abdomen is soft.     Tenderness: There is no abdominal tenderness. There is no rebound.  Musculoskeletal:        General: Normal range of motion.     Cervical back: Normal range of motion and neck supple.  Neurological:     Mental Status: She is alert and oriented to person, place, and time.     Cranial Nerves: No cranial nerve deficit.  Skin:    General: Skin is warm and dry.  Psychiatric:        Judgment: Judgment normal.  Vitals reviewed.      Assessment:  Problem List Items Addressed This Visit      Genitourinary   Vaginal atrophy   Relevant Medications   Estradiol (  IMVEXXY MAINTENANCE PACK) 4 MCG INST (Start on 01/21/2021)   Overactive bladder - Primary   Relevant Orders   Ambulatory referral to Urology    Plan: She will undergo no change in her medical therapy for vag atrophy w Imvexxy.  Referral for alternative options for bladder therapy  She was amenable to this plan and we will see her back for annual/PRN.  A total of 35 minutes were spent face-to-face with the patient as well as preparation, review, communication, and documentation during this encounter.   Barnett Applebaum, MD, [facility], [facility] 01/18/2021  10:56 AM

## 2021-01-29 ENCOUNTER — Other Ambulatory Visit: Payer: Self-pay | Admitting: Cardiovascular Disease

## 2021-02-04 ENCOUNTER — Other Ambulatory Visit: Payer: Self-pay | Admitting: Internal Medicine

## 2021-02-04 NOTE — Telephone Encounter (Signed)
We have not checked her B12 levels since Nov 2020. Not sure if they have been checked somewhere else.

## 2021-02-05 ENCOUNTER — Ambulatory Visit (INDEPENDENT_AMBULATORY_CARE_PROVIDER_SITE_OTHER): Payer: Medicare PPO | Admitting: Urology

## 2021-02-05 ENCOUNTER — Other Ambulatory Visit: Payer: Self-pay

## 2021-02-05 VITALS — BP 169/82 | HR 67 | Ht 62.0 in | Wt 194.0 lb

## 2021-02-05 DIAGNOSIS — K5909 Other constipation: Secondary | ICD-10-CM | POA: Diagnosis not present

## 2021-02-05 DIAGNOSIS — N3281 Overactive bladder: Secondary | ICD-10-CM

## 2021-02-05 DIAGNOSIS — N3941 Urge incontinence: Secondary | ICD-10-CM | POA: Diagnosis not present

## 2021-02-05 LAB — MICROSCOPIC EXAMINATION
Bacteria, UA: NONE SEEN
WBC, UA: NONE SEEN /hpf (ref 0–5)

## 2021-02-05 LAB — BLADDER SCAN AMB NON-IMAGING: Scan Result: 38

## 2021-02-05 LAB — URINALYSIS, COMPLETE
Bilirubin, UA: NEGATIVE
Glucose, UA: NEGATIVE
Ketones, UA: NEGATIVE
Leukocytes,UA: NEGATIVE
Nitrite, UA: NEGATIVE
Protein,UA: NEGATIVE
Specific Gravity, UA: 1.01 (ref 1.005–1.030)
Urobilinogen, Ur: 0.2 mg/dL (ref 0.2–1.0)
pH, UA: 6.5 (ref 5.0–7.5)

## 2021-02-05 NOTE — Progress Notes (Signed)
In and Out Catheterization  Patient is present today for a I & O catheterization due to urinary frequency. Patient was cleaned and prepped in a sterile fashion with betadine . A 16FR cath was inserted no complications were noted , 75 ml of urine return was noted, urine was yellow in color. A clean urine sample was collected for UA. Bladder was drained  And catheter was removed with out difficulty.    Preformed by: Kerman Passey, RMA  Follow up/ Additional notes: 1 month f/u for symptom recheck

## 2021-02-05 NOTE — Telephone Encounter (Signed)
Go ahead and fill it Will have to remember to add it on at her next regular visit

## 2021-02-05 NOTE — Progress Notes (Signed)
02/05/2021 12:52 PM   [name] [birth year] [id number]  Referring provider: Gae Dry, MD [address] [place],  [place] [address]  Chief Complaint  Patient presents with  . Over Active Bladder    HPI: 86 year old female who presents today for further evaluation of urinary frequency and nocturia.  She reports today that her symptoms have been going on for many years but seems to have worsened over the past several months.  She endorses feeling the urge to urinate, standing up and not being of the get to the bathroom on time with large volume incontinence.  This can happen several times a day.  She also gets up several times at night to urinate.  She has less daytime frequency.  She denies any significant leakage with laughing coughing and sneezing.  She is previously tried Myrbetriq but was unable to tolerate this medication due to gastritis.  She does have a personal history of constipation.  She has gotten this under fairly good control.  She is status post hysterectomy.  She does recall undergoing a "bladder tack" at the time about 20 years ago possibly with mesh.  She denies any vaginal symptoms.  She had a pelvic exam last month by Dr. Kenton Kingfisher indicating a well-healed vaginal cough, mild cystocele as well as some vaginal atrophy.  She is not had any urinalysis in the recent several years.  She was unable to void today.  No dysuria or gross hematuria.  No history of UTIs.  PVR today is minimal.  PMH: Past Medical History:  Diagnosis Date  . Adenomatous colon polyp   . Allergic rhinitis due to pollen   . Anemia   . Anxiety   . Bronchiectasis ([place])   . Complication of anesthesia   . Degenerative disc disease    cervical and lumbar  . Dysrhythmia   . GERD (gastroesophageal reflux disease)   . Hearing loss in right ear   . Heart murmur   . HOH (hard of hearing)    AIDS  . Hyperlipidemia   . Hypertension   . Osteoarthritis, multiple sites   . PAF  (paroxysmal atrial fibrillation) ([place])    a. 01/2013 Echo [facility]): EF 60-65%, no rwma, mildly dil LA/RA, mild AI/TR, trace MR, nl RV size/fxn, mild PAH; b. CHA2DS2VASc = 4-->Chronic Xarelto.  Marland Kitchen PMR (polymyalgia rheumatica) ([facility])   . PONV (postoperative nausea and vomiting)   . Rheumatic fever   . Sleep apnea    NO CPAP  . Urge incontinence   . Vertigo   . Vertigo     Surgical History: Past Surgical History:  Procedure Laterality Date  . ABDOMINAL HYSTERECTOMY    . ANKLE FRACTURE SURGERY Left 1996  . BACK SURGERY    . BREAST BIOPSY Left 2010   negative  . CATARACT EXTRACTION W/PHACO Left 10/05/2018   Procedure: CATARACT EXTRACTION PHACO AND INTRAOCULAR LENS PLACEMENT ([place]);  Surgeon: Birder Robson, MD;  Location: [facility] ORS;  Service: Ophthalmology;  Laterality: Left;  [place] 00:40 CDE 6.92 fluid pack lot # [id number]     . CATARACT EXTRACTION W/PHACO Right 10/26/2018   Procedure: CATARACT EXTRACTION PHACO AND INTRAOCULAR LENS PLACEMENT ([facility]);  Surgeon: Birder Robson, MD;  Location: [facility] ORS;  Service: Ophthalmology;  Laterality: Right;  [place]  00:47 CDE 7.11 Fluid pack lot # [id number]  . DEXA  11/09/06   normal  . FRACTURE SURGERY    . Meningitis  1963  . SPINE SURGERY    . SYNOVECTOMY Right  1973   elbow  . TONSILLECTOMY AND ADENOIDECTOMY      Home Medications:  Allergies as of 02/05/2021      Reactions   Actonel [risedronate Sodium] Nausea And Vomiting   Boniva [ibandronic Acid] Nausea And Vomiting   Diclofenac Other (See Comments)   Voltaren Gel aggravated her sinus passages   Fosamax [alendronate Sodium] Nausea And Vomiting   Lipitor [atorvastatin] Other (See Comments)   Muscle aches. Tolerates Crestor.   Miacalcin [calcitonin ([place])] Other (See Comments)   Sneezing    Myrbetriq [mirabegron] Other (See Comments)   Gastritis   Talwin [pentazocine] Other (See Comments)   Hallucinations    Latex Rash   Sneezing, watery eyes.   Niaspan [niacin Er]  Swelling, Rash      Medication List       Accurate as of February 05, 2021 12:52 PM. If you have any questions, ask your nurse or doctor.        STOP taking these medications   ipratropium 0.06 % nasal spray Commonly known as: ATROVENT Stopped by: Hollice Espy, MD     TAKE these medications   cyanocobalamin 1000 MCG/ML injection Commonly known as: (VITAMIN B-12) INJECT 1 ML MONTHLY AS DIRECTED   diltiazem 30 MG tablet Commonly known as: CARDIZEM Take 1 tablet (30 mg total) by mouth 3 (three) times daily as needed (atrial fibrillation lasting more than 2 hours).   fluocinolone 0.01 % cream Commonly known as: VANOS   folic acid 1 MG tablet Commonly known as: FOLVITE TAKE 1 TABLET BY MOUTH EVERY DAY   hydrocortisone 2.5 % cream Apply topically 3 (three) times daily as needed.   Imvexxy Maintenance Pack 4 MCG Inst Generic drug: Estradiol Place 1 ampule vaginally 2 (two) times a week.   ketoconazole 2 % cream Commonly known as: NIZORAL   metoprolol succinate 25 MG 24 hr tablet Commonly known as: TOPROL-XL TAKE 1 TABLET(25 MG) BY MOUTH DAILY. PLEASE CALL OFFICE TO SCHEDULE AN APPOINTMENT FOR FURTHER REFILLS.   predniSONE 5 MG tablet Commonly known as: DELTASONE Take 1 tablet (5 mg total) by mouth daily with breakfast. Wean as directed What changed: additional instructions   propranolol 10 MG tablet Commonly known as: INDERAL Take 1 tablet (10 mg total) by mouth 3 (three) times daily as needed (atrial fibrillation lasting more than 2 hours).   rivaroxaban 20 MG Tabs tablet Commonly known as: Xarelto TAKE 1 TABLET BY MOUTH EVERY DAY WITH EVENING MEAL   rosuvastatin 5 MG tablet Commonly known as: CRESTOR Take 1 tablet (5 mg total) by mouth daily. PLEASE CALL OFFICE TO SCHEDULE AN APPOINTMENT FOR FURTHER REFILLS.   valsartan 80 MG tablet Commonly known as: DIOVAN Take 1 tablet (80 mg total) by mouth daily.   Vitamin D 50 MCG (2000 UT) Caps Take 2,000 Units  by mouth daily.       Allergies:  Allergies  Allergen Reactions  . Actonel [Risedronate Sodium] Nausea And Vomiting  . Boniva [Ibandronic Acid] Nausea And Vomiting  . Diclofenac Other (See Comments)    Voltaren Gel aggravated her sinus passages  . Fosamax [Alendronate Sodium] Nausea And Vomiting  . Lipitor [Atorvastatin] Other (See Comments)    Muscle aches. Tolerates Crestor.  Dion Saucier [Calcitonin ([place])] Other (See Comments)    Sneezing   . Myrbetriq [Mirabegron] Other (See Comments)    Gastritis  . Talwin [Pentazocine] Other (See Comments)    Hallucinations   . Latex Rash    Sneezing, watery eyes.  Marland Kitchen  Niaspan [Niacin Er] Swelling and Rash    Family History: Family History  Problem Relation Age of Onset  . Schizophrenia Mother        paranoid  . Heart disease Mother        from psych meds  . Arthritis Mother   . Rheum arthritis Mother   . Cancer Father        prostate  . Arthritis Father   . Diabetes Father   . Ovarian cancer Maternal Grandmother   . Uterine cancer Maternal Grandmother   . Colon cancer Maternal Grandmother   . Breast cancer Paternal Aunt 2    Social History:  reports that she has never smoked. She has never used smokeless tobacco. She reports current alcohol use. She reports that she does not use drugs.   Physical Exam: BP (!) 169/82   Pulse 67   Ht 5\' 2"  (1.575 m)   Wt 194 lb (88 kg)   BMI 35.48 kg/m   Constitutional:  Alert and oriented, No acute distress. HEENT: [place] AT, moist mucus membranes.  Trachea midline, no masses. Cardiovascular: No clubbing, cyanosis, or edema. Respiratory: Normal respiratory effort, no increased work of breathing. Skin: No rashes, bruises or suspicious lesions. Neurologic: Grossly intact, no focal deficits, moving all 4 extremities. Psychiatric: Normal mood and affect.  Laboratory Data: Lab Results  Component Value Date   WBC 5.3 11/21/2020   HGB 12.4 11/21/2020   HCT 37.7 11/21/2020   MCV 96.7  11/21/2020   PLT 220.0 11/21/2020    Lab Results  Component Value Date   CREATININE 0.85 11/21/2020    Urinalysis Catheterized specimen was obtained she was unable to void for [place] today.  This was unremarkable.  Pertinent Imaging: Results for orders placed or performed in visit on 02/05/21  BLADDER SCAN AMB NON-IMAGING  Result Value Ref Range   Scan Result 38 ml      Assessment & Plan:    1. Overactive bladder Based on longstanding and worsening symptoms, I suspect underlying overactive bladder with urgency and urge incontinence  We were able to get a urine today which has no blood or signs of infection is contributing factors.  We did discuss behavioral modifications at length today.  She does avoid caffeinated beverages but does drink decaf coffee fairly regularly.  She is also been working on her constipation.  Given her octogenarian state, I am hesitant to recommend an anticholinergic due to concern for worsening constipation as well as cognitive decline.  She was given samples of Gemtesa to try.    We also went ahead and discussed the OAB pathway including treatments for refractory OAB including PTNS and Botox.  Reassess in 4 weeks with PVR.  - Urinalysis, Complete - BLADDER SCAN AMB NON-IMAGING  2. Urge incontinence As above  3. Chronic constipation As above    Hollice Espy, MD  [facility] [address] [place], [place] [address] [phone]

## 2021-02-05 NOTE — Telephone Encounter (Signed)
Rx sent electronically.  

## 2021-03-04 ENCOUNTER — Other Ambulatory Visit: Payer: Self-pay | Admitting: Internal Medicine

## 2021-03-05 ENCOUNTER — Ambulatory Visit: Payer: Self-pay | Admitting: Physician Assistant

## 2021-03-08 ENCOUNTER — Other Ambulatory Visit: Payer: Self-pay

## 2021-03-08 ENCOUNTER — Encounter: Payer: Self-pay | Admitting: Physician Assistant

## 2021-03-08 ENCOUNTER — Ambulatory Visit: Payer: Medicare PPO | Admitting: Physician Assistant

## 2021-03-08 VITALS — BP 173/80 | HR 67 | Ht 66.0 in | Wt 192.0 lb

## 2021-03-08 DIAGNOSIS — N3281 Overactive bladder: Secondary | ICD-10-CM | POA: Diagnosis not present

## 2021-03-08 LAB — BLADDER SCAN AMB NON-IMAGING

## 2021-03-08 NOTE — Progress Notes (Signed)
03/08/2021 12:07 PM   Jennifer Schmitt September 15, 1935 [id number]  CC: Chief Complaint  Patient presents with  . Over Active Bladder   HPI: Jennifer Schmitt is a 86 y.o. female with PMH OAB wet with nocturia who previously stopped Myrbetriq secondary to gastritis who presents today for symptom recheck and PVR on Gemtesa.  Today she reports having taken Gemtesa with return of RUQ discomfort and nausea.  She temporarily stopped Gemtesa and noticed improvement in her symptoms, which subsequently worsened again when she resumed [place]  She wishes to stop Gemtesa at this time and states that her urinary symptoms have not yet returned to baseline since initiating a trial of this med.  She wishes to defer alternative therapies for OAB at this time pending worsening of her urinary symptoms.  PVR 9 mL.   PMH: Past Medical History:  Diagnosis Date  . Adenomatous colon polyp   . Allergic rhinitis due to pollen   . Anemia   . Anxiety   . Bronchiectasis ([place])   . Complication of anesthesia   . Degenerative disc disease    cervical and lumbar  . Dysrhythmia   . GERD (gastroesophageal reflux disease)   . Hearing loss in right ear   . Heart murmur   . HOH (hard of hearing)    AIDS  . Hyperlipidemia   . Hypertension   . Osteoarthritis, multiple sites   . PAF (paroxysmal atrial fibrillation) ([place])    a. 01/2013 Echo [facility]): EF 60-65%, no rwma, mildly dil LA/RA, mild AI/TR, trace MR, nl RV size/fxn, mild PAH; b. CHA2DS2VASc = 4-->Chronic Xarelto.  Marland Kitchen PMR (polymyalgia rheumatica) ([facility])   . PONV (postoperative nausea and vomiting)   . Rheumatic fever   . Sleep apnea    NO CPAP  . Urge incontinence   . Vertigo   . Vertigo     Surgical History: Past Surgical History:  Procedure Laterality Date  . ABDOMINAL HYSTERECTOMY    . ANKLE FRACTURE SURGERY Left 1996  . BACK SURGERY    . BREAST BIOPSY Left 2010   negative  . CATARACT EXTRACTION W/PHACO Left 10/05/2018   Procedure:  CATARACT EXTRACTION PHACO AND INTRAOCULAR LENS PLACEMENT ([place]);  Surgeon: Birder Robson, MD;  Location: [facility] ORS;  Service: Ophthalmology;  Laterality: Left;  [place] 00:40 CDE 6.92 fluid pack lot # [id number]     . CATARACT EXTRACTION W/PHACO Right 10/26/2018   Procedure: CATARACT EXTRACTION PHACO AND INTRAOCULAR LENS PLACEMENT ([facility]);  Surgeon: Birder Robson, MD;  Location: [facility] ORS;  Service: Ophthalmology;  Laterality: Right;  [place]  00:47 CDE 7.11 Fluid pack lot # [id number]  . DEXA  11/09/06   normal  . FRACTURE SURGERY    . Meningitis  1963  . SPINE SURGERY    . SYNOVECTOMY Right 1973   elbow  . TONSILLECTOMY AND ADENOIDECTOMY      Home Medications:  Allergies as of 03/08/2021      Reactions   Actonel [risedronate Sodium] Nausea And Vomiting   Boniva [ibandronic Acid] Nausea And Vomiting   Diclofenac Other (See Comments)   Voltaren Gel aggravated her sinus passages   Fosamax [alendronate Sodium] Nausea And Vomiting   Lipitor [atorvastatin] Other (See Comments)   Muscle aches. Tolerates Crestor.   Miacalcin [calcitonin ([place])] Other (See Comments)   Sneezing    Myrbetriq [mirabegron] Other (See Comments)   Gastritis   Talwin [pentazocine] Other (See Comments)   Hallucinations    Latex Rash   Sneezing, watery eyes.  Niaspan [niacin Er] Swelling, Rash      Medication List       Accurate as of March 08, 2021 12:07 PM. If you have any questions, ask your nurse or doctor.        cyanocobalamin 1000 MCG/ML injection Commonly known as: (VITAMIN B-12) INJECT 1 ML MONTHLY AS DIRECTED   diltiazem 30 MG tablet Commonly known as: CARDIZEM Take 1 tablet (30 mg total) by mouth 3 (three) times daily as needed (atrial fibrillation lasting more than 2 hours).   fluocinolone 0.01 % cream Commonly known as: VANOS   folic acid 1 MG tablet Commonly known as: FOLVITE TAKE 1 TABLET BY MOUTH EVERY DAY   hydrocortisone 2.5 % cream Apply topically 3 (three) times daily as  needed.   Imvexxy Maintenance Pack 4 MCG Inst Generic drug: Estradiol Place 1 ampule vaginally 2 (two) times a week.   ketoconazole 2 % cream Commonly known as: NIZORAL   metoprolol succinate 25 MG 24 hr tablet Commonly known as: TOPROL-XL TAKE 1 TABLET(25 MG) BY MOUTH DAILY. PLEASE CALL OFFICE TO SCHEDULE AN APPOINTMENT FOR FURTHER REFILLS.   predniSONE 5 MG tablet Commonly known as: DELTASONE Take 1 tablet (5 mg total) by mouth daily with breakfast. Wean as directed. About 3-4 a week   propranolol 10 MG tablet Commonly known as: INDERAL Take 1 tablet (10 mg total) by mouth 3 (three) times daily as needed (atrial fibrillation lasting more than 2 hours).   rivaroxaban 20 MG Tabs tablet Commonly known as: Xarelto TAKE 1 TABLET BY MOUTH EVERY DAY WITH EVENING MEAL   rosuvastatin 5 MG tablet Commonly known as: CRESTOR Take 1 tablet (5 mg total) by mouth daily. PLEASE CALL OFFICE TO SCHEDULE AN APPOINTMENT FOR FURTHER REFILLS.   valsartan 80 MG tablet Commonly known as: DIOVAN Take 1 tablet (80 mg total) by mouth daily.   Vitamin D 50 MCG (2000 UT) Caps Take 2,000 Units by mouth daily.       Allergies:  Allergies  Allergen Reactions  . Actonel [Risedronate Sodium] Nausea And Vomiting  . Boniva [Ibandronic Acid] Nausea And Vomiting  . Diclofenac Other (See Comments)    Voltaren Gel aggravated her sinus passages  . Fosamax [Alendronate Sodium] Nausea And Vomiting  . Lipitor [Atorvastatin] Other (See Comments)    Muscle aches. Tolerates Crestor.  Dion Saucier [Calcitonin ([place])] Other (See Comments)    Sneezing   . Myrbetriq [Mirabegron] Other (See Comments)    Gastritis  . Talwin [Pentazocine] Other (See Comments)    Hallucinations   . Latex Rash    Sneezing, watery eyes.  . Niaspan [Niacin Er] Swelling and Rash    Family History: Family History  Problem Relation Age of Onset  . Schizophrenia Mother        paranoid  . Heart disease Mother        from psych  meds  . Arthritis Mother   . Rheum arthritis Mother   . Cancer Father        prostate  . Arthritis Father   . Diabetes Father   . Ovarian cancer Maternal Grandmother   . Uterine cancer Maternal Grandmother   . Colon cancer Maternal Grandmother   . Breast cancer Paternal Aunt 18    Social History:   reports that she has never smoked. She has never used smokeless tobacco. She reports current alcohol use. She reports that she does not use drugs.  Physical Exam: BP (!) 173/80   Pulse 67  Ht 5\' 6"  (1.676 m)   Wt 192 lb (87.1 kg)   BMI 30.99 kg/m   Constitutional:  Alert and oriented, no acute distress, nontoxic appearing HEENT: [place], AT Cardiovascular: No clubbing, cyanosis, or edema Respiratory: Normal respiratory effort, no increased work of breathing Skin: No rashes, bruises or suspicious lesions Neurologic: Grossly intact, no focal deficits, moving all 4 extremities Psychiatric: Normal mood and affect  Laboratory Data: Results for orders placed or performed in visit on 03/08/21  Bladder Scan (Post Void Residual) in office  Result Value Ref Range   Scan Result 8mL    Assessment & Plan:   1. Overactive bladder Patient could not tolerate Gemtesa either and wishes to defer further therapy pending symptomatic worsening.  I am in agreement with this plan.  We did discuss alternative OAB therapies today including intravesical Botox, PTNS, and InterStim.  She is not interested in pursuing intravesical Botox or InterStim but may consider PTNS in the future. - Bladder Scan (Post Void Residual) in office  Return if symptoms worsen or fail to improve.  Debroah Loop, PA-C  [facility] [address] [place], [place] [address] [phone]

## 2021-03-19 NOTE — Progress Notes (Signed)
Cardiology Office Note  Date:  03/20/2021   ID:  Jennifer Schmitt, Jennifer Schmitt 1935-10-20, MRN [id number]  PCP:  Venia Carbon, MD   Chief Complaint  Patient presents with  . Follow-up    12 month F/U    HPI:  Jennifer Schmitt is a very pleasant 86 year old woman with history of  paroxysmal atrial fibrillation,  obstructive sleep apnea who does not wear CPAP,  hypertension,  hyperlipidemia  who presents for routine followup of her atrial fibrillation. She spends much of her winter in [place], has a cardiologist there  [place] discussion concerning recent stressors Stress at home, Husband with dementia, sleeps all day  Has supports at [facility]  Sedentary, secondary to leaving husband Body getting weaker, stiff  neck  120-140/60 to 70 Hurting this AM, BP elevated today Off HCTZ, polyuria  Denies much atrial fib Several episodes in 2021  Previously on prednisone for polymyalgia History of chronic dizziness, vertigo symptoms.  EKG personally reviewed by myself on todays visit Shows normal sinus rhythm rate 69 bpm no significant ST or T wave changes  Family history Parents with no CAD, no MI Grandmother with CHF  Other past medical history reviewed Carotid u/s 2011: intimal thickening She reports that she had recent screening, was told she had no significant carotid disease. She will bring in the report for our review  05/31/2015 had severe vomiting, went to the emergency room Electrolytes were normal, no atrial fibrillation Lab work reviewed with her today from recent lab draw, total cholesterol 160 B-12 improving, sedimentation rate 40  She reports having an episode of atrial fibrillation in August 2015 It lasted approximately 6 hours. She took diltiazem and propranolol.  she converted back to normal sinus rhythm   PMH:   has a past medical history of Adenomatous colon polyp, Allergic rhinitis due to pollen, Anemia, Anxiety, Bronchiectasis ([place]), Complication of  anesthesia, Degenerative disc disease, Dysrhythmia, GERD (gastroesophageal reflux disease), Hearing loss in right ear, Heart murmur, HOH (hard of hearing), Hyperlipidemia, Hypertension, Osteoarthritis, multiple sites, PAF (paroxysmal atrial fibrillation) ([place]), PMR (polymyalgia rheumatica) (HCC), PONV (postoperative nausea and vomiting), Rheumatic fever, Sleep apnea, Urge incontinence, Vertigo, and Vertigo.  PSH:    Past Surgical History:  Procedure Laterality Date  . ABDOMINAL HYSTERECTOMY    . ANKLE FRACTURE SURGERY Left 1996  . BACK SURGERY    . BREAST BIOPSY Left 2010   negative  . CATARACT EXTRACTION W/PHACO Left 10/05/2018   Procedure: CATARACT EXTRACTION PHACO AND INTRAOCULAR LENS PLACEMENT ([place]);  Surgeon: Birder Robson, MD;  Location: [facility];  Service: Ophthalmology;  Laterality: Left;  [place] 00:40 CDE 6.92 fluid pack lot # [id number]     . CATARACT EXTRACTION W/PHACO Right 10/26/2018   Procedure: CATARACT EXTRACTION PHACO AND INTRAOCULAR LENS PLACEMENT ([facility]);  Surgeon: Birder Robson, MD;  Location: [facility];  Service: Ophthalmology;  Laterality: Right;  [place]  00:47 CDE 7.11 Fluid pack lot # [id number]  . DEXA  11/09/06   normal  . FRACTURE SURGERY    . Meningitis  1963  . SPINE SURGERY    . SYNOVECTOMY Right 1973   elbow  . TONSILLECTOMY AND ADENOIDECTOMY      Current Outpatient Medications  Medication Sig Dispense Refill  . acetaminophen (TYLENOL) 500 MG tablet Take 1,000 mg by mouth as needed.    . Cholecalciferol (VITAMIN D) 2000 units CAPS Take 2,000 Units by mouth daily.    . cyanocobalamin (,VITAMIN B-12,) 1000 MCG/ML injection INJECT 1 ML MONTHLY AS DIRECTED 10  mL 1  . diltiazem (CARDIZEM) 30 MG tablet Take 1 tablet (30 mg total) by mouth 3 (three) times daily as needed (atrial fibrillation lasting more than 2 hours). 10 tablet 6  . Estradiol (IMVEXXY MAINTENANCE PACK) 4 MCG INST Place 1 ampule vaginally 2 (two) times a week. 24 each 3  . folic acid (FOLVITE) 1  MG tablet TAKE 1 TABLET BY MOUTH EVERY DAY 90 tablet 3  . hydrocortisone 2.5 % cream Apply topically 3 (three) times daily as needed. 28 g 3  . metoprolol succinate (TOPROL-XL) 25 MG 24 hr tablet TAKE 1 TABLET(25 MG) BY MOUTH DAILY. PLEASE CALL OFFICE TO SCHEDULE AN APPOINTMENT FOR FURTHER REFILLS. 30 tablet 0  . predniSONE (DELTASONE) 5 MG tablet Take 1 tablet (5 mg total) by mouth daily with breakfast. Wean as directed. About 3-4 a week 100 tablet 1  . propranolol (INDERAL) 10 MG tablet Take 1 tablet (10 mg total) by mouth 3 (three) times daily as needed (atrial fibrillation lasting more than 2 hours). 10 tablet 6  . rivaroxaban (XARELTO) 20 MG TABS tablet TAKE 1 TABLET BY MOUTH EVERY DAY WITH EVENING MEAL 90 tablet 3  . rosuvastatin (CRESTOR) 5 MG tablet Take 1 tablet (5 mg total) by mouth daily. PLEASE CALL OFFICE TO SCHEDULE AN APPOINTMENT FOR FURTHER REFILLS. 30 tablet 0  . Trolamine Salicylate (ASPERCREME EX) Apply topically as needed.    . valsartan (DIOVAN) 80 MG tablet Take 1 tablet (80 mg total) by mouth daily. 90 tablet 3   No current facility-administered medications for this visit.    Allergies:   Actonel [risedronate sodium], Boniva [ibandronic acid], Diclofenac, Fosamax [alendronate sodium], Lipitor [atorvastatin], Miacalcin [calcitonin (salmon)], Myrbetriq [mirabegron], Talwin [pentazocine], Latex, and Niaspan [niacin er]   Social History:  The patient  reports that she has never smoked. She has never used smokeless tobacco. She reports current alcohol use. She reports that she does not use drugs.   Family History:   family history includes Arthritis in her father and mother; Breast cancer (age of onset: 95) in her paternal aunt; Cancer in her father; Colon cancer in her maternal grandmother; Diabetes in her father; Heart disease in her mother; Ovarian cancer in her maternal grandmother; Rheum arthritis in her mother; Schizophrenia in her mother; Uterine cancer in her maternal  grandmother.    Review of Systems: Review of Systems  Constitutional: Negative.   Respiratory: Negative.   Cardiovascular: Negative.   Gastrointestinal: Negative.   Musculoskeletal: Negative.   Neurological: Negative.   Psychiatric/Behavioral: Negative.   All other systems reviewed and are negative.   PHYSICAL EXAM: VS:  BP 140/65   Pulse 69   Ht 5\' 7"  (1.702 m)   Wt 193 lb (87.5 kg)   SpO2 98%   BMI 30.23 kg/m  , BMI Body mass index is 30.23 kg/m. Constitutional:  oriented to person, place, and time. No distress.  HENT:  Head: Grossly normal Eyes:  no discharge. No scleral icterus.  Neck: No JVD, no carotid bruits  Cardiovascular: Regular rate and rhythm, 1/6 SEM RSB Pulmonary/Chest: Clear to auscultation bilaterally, no wheezes or rails Abdominal: Soft.  no distension.  no tenderness.  Musculoskeletal: Normal range of motion Neurological:  normal muscle tone. Coordination normal. No atrophy Skin: Skin warm and dry Psychiatric: normal affect, pleasant  Recent Labs: 11/21/2020: ALT 12; BUN 15; Creatinine, Ser 0.85; Hemoglobin 12.4; Platelets 220.0; Potassium 4.6; Sodium 140    Lipid Panel Lab Results  Component Value Date   CHOL  182 11/21/2020   HDL 72.70 11/21/2020   LDLCALC 84 11/21/2020   TRIG 128.0 11/21/2020      Wt Readings from Last 3 Encounters:  03/20/21 193 lb (87.5 kg)  03/08/21 192 lb (87.1 kg)  02/05/21 194 lb (88 kg)     ASSESSMENT AND PLAN:  Paroxysmal atrial fibrillation (HCC) - Plan: EKG 12-Lead No recent episodes Stay on xarelto, metoprolol  Essential hypertension - Plan: EKG 12-Lead Elevated today, rushing, back pain Better on recheck , no medication changes At home 120 to 140  Pure hypercholesterolemia Tolerating Crestor 5 daily No changes   Total encounter time more than 25 minutes  Greater than 50% was spent in counseling and coordination of care with the patient    Orders Placed This Encounter  Procedures  . EKG  12-Lead     Signed, Esmond Plants, M.D., Ph.D. 03/20/2021  [place], [place]

## 2021-03-20 ENCOUNTER — Other Ambulatory Visit: Payer: Self-pay

## 2021-03-20 ENCOUNTER — Encounter: Payer: Self-pay | Admitting: Cardiovascular Disease

## 2021-03-20 ENCOUNTER — Ambulatory Visit: Payer: Medicare PPO | Admitting: Cardiovascular Disease

## 2021-03-20 VITALS — BP 140/65 | HR 69 | Ht 67.0 in | Wt 193.0 lb

## 2021-03-20 DIAGNOSIS — I10 Essential (primary) hypertension: Secondary | ICD-10-CM

## 2021-03-20 DIAGNOSIS — E782 Mixed hyperlipidemia: Secondary | ICD-10-CM

## 2021-03-20 DIAGNOSIS — I480 Paroxysmal atrial fibrillation: Secondary | ICD-10-CM

## 2021-03-20 NOTE — Patient Instructions (Addendum)
Medication Instructions:  No changes  If you need a refill on your cardiac medications before your next appointment, please call your pharmacy.    Lab work: No new labs needed   If you have labs (blood work) drawn today and your tests are completely normal, you will receive your results only by: . MyChart Message (if you have MyChart) OR . A paper copy in the mail If you have any lab test that is abnormal or we need to change your treatment, we will call you to review the results.   Testing/Procedures: No new testing needed   Follow-Up: At [facility], you and your health needs are our priority.  As part of our continuing mission to provide you with exceptional heart care, we have created designated Provider Care Teams.  These Care Teams include your primary Cardiologist (physician) and Advanced Practice Providers (APPs -  Physician Assistants and Nurse Practitioners) who all work together to provide you with the care you need, when you need it.  . You will need a follow up appointment in 12 months  . Providers on your designated Care Team:   . Christopher Berge, NP . Ryan Dunn, PA-C . Jacquelyn Visser, PA-C  Any Other Special Instructions Will Be Listed Below (If Applicable).  COVID-19 Vaccine Information can be found at: [email or web address] For questions related to vaccine distribution or appointments, please email [email or web address] or call [phone].     

## 2021-03-31 ENCOUNTER — Other Ambulatory Visit: Payer: Self-pay | Admitting: Cardiovascular Disease

## 2021-04-22 DIAGNOSIS — L570 Actinic keratosis: Secondary | ICD-10-CM | POA: Diagnosis not present

## 2021-05-07 ENCOUNTER — Other Ambulatory Visit: Payer: Self-pay | Admitting: Cardiovascular Disease

## 2021-05-13 ENCOUNTER — Other Ambulatory Visit: Payer: Self-pay | Admitting: Internal Medicine

## 2021-06-25 ENCOUNTER — Other Ambulatory Visit: Payer: Self-pay | Admitting: Cardiovascular Disease

## 2021-06-26 ENCOUNTER — Other Ambulatory Visit: Payer: Self-pay

## 2021-06-26 ENCOUNTER — Encounter: Payer: Self-pay | Admitting: Internal Medicine

## 2021-06-26 ENCOUNTER — Ambulatory Visit: Payer: Medicare PPO | Admitting: Internal Medicine

## 2021-06-26 VITALS — BP 140/80 | HR 66 | Temp 97.7°F | Ht 67.0 in | Wt 194.0 lb

## 2021-06-26 DIAGNOSIS — M353 Polymyalgia rheumatica: Secondary | ICD-10-CM

## 2021-06-26 DIAGNOSIS — R194 Change in bowel habit: Secondary | ICD-10-CM | POA: Diagnosis not present

## 2021-06-26 DIAGNOSIS — F39 Unspecified mood [affective] disorder: Secondary | ICD-10-CM | POA: Diagnosis not present

## 2021-06-26 LAB — COMPREHENSIVE METABOLIC PANEL WITH GFR
ALT: 14 U/L (ref 0–35)
AST: 17 U/L (ref 0–37)
Albumin: 4.2 g/dL (ref 3.5–5.2)
Alkaline Phosphatase: 58 U/L (ref 39–117)
BUN: 15 mg/dL (ref 6–23)
CO2: 31 meq/L (ref 19–32)
Calcium: 9.4 mg/dL (ref 8.4–10.5)
Chloride: 104 meq/L (ref 96–112)
Creatinine, Ser: 0.84 mg/dL (ref 0.40–1.20)
GFR: 64.09 mL/min
Glucose, Bld: 90 mg/dL (ref 70–99)
Potassium: 5 meq/L (ref 3.5–5.1)
Sodium: 141 meq/L (ref 135–145)
Total Bilirubin: 0.4 mg/dL (ref 0.2–1.2)
Total Protein: 7.4 g/dL (ref 6.0–8.3)

## 2021-06-26 LAB — CBC
HCT: 38 % (ref 36.0–46.0)
Hemoglobin: 12.8 g/dL (ref 12.0–15.0)
MCHC: 33.7 g/dL (ref 30.0–36.0)
MCV: 96.9 fl (ref 78.0–100.0)
Platelets: 244 K/uL (ref 150.0–400.0)
RBC: 3.92 Mil/uL (ref 3.87–5.11)
RDW: 14.8 % (ref 11.5–15.5)
WBC: 5 K/uL (ref 4.0–10.5)

## 2021-06-26 LAB — SEDIMENTATION RATE: Sed Rate: 11 mm/h (ref 0–30)

## 2021-06-26 MED ORDER — ROSUVASTATIN CALCIUM 5 MG PO TABS: 5.0000 mg | ORAL_TABLET | Freq: Every day | ORAL | 3 refills | Status: DC

## 2021-06-26 MED ORDER — LORAZEPAM 0.5 MG PO TABS: 0.2500 mg | ORAL_TABLET | Freq: Two times a day (BID) | ORAL | 0 refills | Status: DC | PRN

## 2021-06-26 NOTE — Assessment & Plan Note (Signed)
Ongoing mostly situational stress Will give lorazepam for prn use (especially at bedtime)

## 2021-06-26 NOTE — Assessment & Plan Note (Signed)
Seems to be related to stress Discussed symptoms that would warrant further evaluation (like loss of appetite, weight loss, blood in stools). Recommended trying fiber

## 2021-06-26 NOTE — Progress Notes (Signed)
Subjective:    Patient ID: Jennifer Schmitt, female    DOB: 05/14/1935, 86 y.o.   MRN: [id number]  HPI Here with multiple concerns This visit occurred during the SARS-CoV-2 public health emergency.  Safety protocols were in place, including screening questions prior to the visit, additional usage of staff PPE, and extensive cleaning of exam room while observing appropriate contact time as indicated for disinfecting solutions.    Most concerning problem is GI---bowels going between diarrhea and constipation This is not new--mostly associated with stress Tried dulcolax and it made her sick and sore Not using imodium, etc No blood in stool (just occ on toilet paper) Appetite is still very good---no weight loss Some soreness in RUQ  Husband is declining with the dementia---and has no insight Has more housekeeping----and friends come over Is able to leave for short periods still  Anxiety is an issue It is causing sleep problems----would like something else (mild sedative)  Off prednisone for a month or more No clear overall achiness--but has pain in back of neck and shoulders (especially in the morning) Some hip and knee pain as well (especially right knee that she strained in the past)  Current Outpatient Medications on File Prior to Visit  Medication Sig Dispense Refill   acetaminophen (TYLENOL) 500 MG tablet Take 1,000 mg by mouth as needed.     Cholecalciferol (VITAMIN D) 2000 units CAPS Take 2,000 Units by mouth daily.     cyanocobalamin (,VITAMIN B-12,) 1000 MCG/ML injection INJECT 1 ML MONTHLY AS DIRECTED 10 mL 1   diltiazem (CARDIZEM) 30 MG tablet Take 1 tablet (30 mg total) by mouth 3 (three) times daily as needed (atrial fibrillation lasting more than 2 hours). 10 tablet 6   Estradiol (IMVEXXY MAINTENANCE PACK) 4 MCG INST Place 1 ampule vaginally 2 (two) times a week. 24 each 3   folic acid (FOLVITE) 1 MG tablet TAKE 1 TABLET BY MOUTH EVERY DAY 90 tablet 3   hydrocortisone  2.5 % cream Apply topically 3 (three) times daily as needed. 28 g 3   metoprolol succinate (TOPROL-XL) 25 MG 24 hr tablet TAKE 1 TABLET(25 MG) BY MOUTH DAILY 90 tablet 1   propranolol (INDERAL) 10 MG tablet Take 1 tablet (10 mg total) by mouth 3 (three) times daily as needed (atrial fibrillation lasting more than 2 hours). 10 tablet 6   rivaroxaban (XARELTO) 20 MG TABS tablet TAKE 1 TABLET BY MOUTH EVERY DAY WITH EVENING MEAL 90 tablet 3   rosuvastatin (CRESTOR) 5 MG tablet Take 1 tablet (5 mg total) by mouth daily. PLEASE CALL OFFICE TO SCHEDULE AN APPOINTMENT FOR FURTHER REFILLS. 30 tablet 0   Trolamine Salicylate (ASPERCREME EX) Apply topically as needed.     valsartan (DIOVAN) 80 MG tablet TAKE 1 TABLET(80 MG) BY MOUTH DAILY 90 tablet 3   No current facility-administered medications on file prior to visit.    Allergies  Allergen Reactions   Actonel [Risedronate Sodium] Nausea And Vomiting   Boniva [Ibandronic Acid] Nausea And Vomiting   Diclofenac Other (See Comments)    Voltaren Gel aggravated her sinus passages   Fosamax [Alendronate Sodium] Nausea And Vomiting   Lipitor [Atorvastatin] Other (See Comments)    Muscle aches. Tolerates Crestor.   Miacalcin [Calcitonin (Salmon)] Other (See Comments)    Sneezing    Myrbetriq [Mirabegron] Other (See Comments)    Gastritis   Talwin [Pentazocine] Other (See Comments)    Hallucinations    Latex Rash    Sneezing, watery  eyes.   Niaspan [Niacin Er] Swelling and Rash    Past Medical History:  Diagnosis Date   Adenomatous colon polyp    Allergic rhinitis due to pollen    Anemia    Anxiety    Bronchiectasis (HCC)    Complication of anesthesia    Degenerative disc disease    cervical and lumbar   Dysrhythmia    GERD (gastroesophageal reflux disease)    Hearing loss in right ear    Heart murmur    HOH (hard of hearing)    AIDS   Hyperlipidemia    Hypertension    Osteoarthritis, multiple sites    PAF (paroxysmal atrial  fibrillation) ([place])    a. 01/2013 Echo [facility]): EF 60-65%, no rwma, mildly dil LA/RA, mild AI/TR, trace MR, nl RV size/fxn, mild PAH; b. CHA2DS2VASc = 4-->Chronic Xarelto.   PMR (polymyalgia rheumatica) (HCC)    PONV (postoperative nausea and vomiting)    Rheumatic fever    Sleep apnea    NO CPAP   Urge incontinence    Vertigo    Vertigo     Past Surgical History:  Procedure Laterality Date   ABDOMINAL HYSTERECTOMY     ANKLE FRACTURE SURGERY Left 1996   BACK SURGERY     BREAST BIOPSY Left 2010   negative   CATARACT EXTRACTION W/PHACO Left 10/05/2018   Procedure: CATARACT EXTRACTION PHACO AND INTRAOCULAR LENS PLACEMENT ([place]);  Surgeon: Birder Robson, MD;  Location: [facility] OR[facility];  Service: Ophthalmology;  Laterality: Left;  [place] 00:40 CDE 6.92 fluid pack lot # [id number]      CATARACT EXTRACTION W/PHACO Right 10/26/2018   Procedure: CATARACT EXTRACTION PHACO AND INTRAOCULAR LENS PLACEMENT ([facility]);  Surgeon: Birder Robson, MD;  Location: [facility];  Service: Ophthalmology;  Laterality: Right;  [place]  00:47 CDE 7.11 Fluid pack lot # [id number]   DEXA  11/09/06   normal   FRACTURE SURGERY     Meningitis  1963   SPINE SURGERY     SYNOVECTOMY Right 1973   elbow   TONSILLECTOMY AND ADENOIDECTOMY      Family History  Problem Relation Age of Onset   Schizophrenia Mother        paranoid   Heart disease Mother        from psych meds   Arthritis Mother    Rheum arthritis Mother    Cancer Father        prostate   Arthritis Father    Diabetes Father    Ovarian cancer Maternal Grandmother    Uterine cancer Maternal Grandmother    Colon cancer Maternal Grandmother    Breast cancer Paternal Aunt 80    Social History   Socioeconomic History   Marital status: Married    Spouse name: Not on file   Number of children: 1   Years of education: Not on file   Highest education level: Not on file  Occupational History   Occupation: Scientist, physiological and college professor     Comment: [employer]--PhD in Special education  Tobacco Use   Smoking status: Never   Smokeless tobacco: Never  Vaping Use   Vaping Use: Never used  Substance and Sexual Activity   Alcohol use: Yes    Comment: OCCAS   Drug use: No   Sexual activity: Not on file  Other Topics Concern   Not on file  Social History Narrative   Retired 2007    1 son in [place] area   Spends part time in  [place]      Has living will   Husband then son is health care POA   Not sure about DNR---will keep open resuscitation for now    No tube feedings if cognitively unaware               Social Determinants of Health   Financial Resource Strain: Not on file  Food Insecurity: Not on file  Transportation Needs: Not on file  Physical Activity: Not on file  Stress: Not on file  Social Connections: Not on file  Intimate Partner Violence: Not on file   Review of Systems Some numbness on right foot---over metatarsal (?from past fracture) Ongoing mild memory issues--discussed it could be stress related     Objective:   Physical Exam Constitutional:      Appearance: Normal appearance.  Cardiovascular:     Rate and Rhythm: Normal rate and regular rhythm.     Heart sounds: No murmur heard.   No gallop.  Pulmonary:     Effort: Pulmonary effort is normal.     Breath sounds: Normal breath sounds. No wheezing or rales.  Abdominal:     Palpations: Abdomen is soft.     Tenderness: There is no guarding or rebound.     Comments: Slight "soreness" on left > right  Musculoskeletal:     Cervical back: Neck supple.     Right lower leg: No edema.     Left lower leg: No edema.     Comments: Normal ROM in hips No swelling in knees. No ligament or meniscus findings in knees. Some crepitus on right--but ROM is pretty good  Lymphadenopathy:     Cervical: No cervical adenopathy.  Neurological:     Mental Status: She is alert.     Comments: Mild limp in walk---no focal weakness            Assessment & Plan:

## 2021-06-26 NOTE — Patient Instructions (Signed)
Please try a fiber supplement (like metamucil, citrucel or fibercon). If you get bound up, you can try senna-s 1-2 tabs once or twice a day.

## 2021-06-26 NOTE — Assessment & Plan Note (Signed)
Some increase in neck and shoulder symptoms Will check sed rate---resume low dose prednisone if sed rate is up

## 2021-07-03 ENCOUNTER — Ambulatory Visit: Payer: Medicare PPO | Admitting: Family Medicine

## 2021-07-08 ENCOUNTER — Ambulatory Visit: Payer: Medicare PPO | Admitting: Family Medicine

## 2021-07-08 ENCOUNTER — Other Ambulatory Visit: Payer: Self-pay

## 2021-07-08 ENCOUNTER — Encounter: Payer: Self-pay | Admitting: Family Medicine

## 2021-07-08 VITALS — BP 142/86 | HR 71 | Temp 97.6°F | Ht 65.5 in | Wt 196.0 lb

## 2021-07-08 DIAGNOSIS — G8929 Other chronic pain: Secondary | ICD-10-CM | POA: Diagnosis not present

## 2021-07-08 DIAGNOSIS — M545 Low back pain, unspecified: Secondary | ICD-10-CM | POA: Diagnosis not present

## 2021-07-08 DIAGNOSIS — R29898 Other symptoms and signs involving the musculoskeletal system: Secondary | ICD-10-CM | POA: Diagnosis not present

## 2021-07-08 DIAGNOSIS — G5702 Lesion of sciatic nerve, left lower limb: Secondary | ICD-10-CM | POA: Diagnosis not present

## 2021-07-08 DIAGNOSIS — M25571 Pain in right ankle and joints of right foot: Secondary | ICD-10-CM | POA: Diagnosis not present

## 2021-07-08 NOTE — Progress Notes (Signed)
Jennifer Schmitt T. Jennifer Wolff, MD, Jennifer Schmitt at [facility] [place], [address]  Phone: [phone]  FAX: [phone]  Jennifer Schmitt - 86 y.o. female  MRN [id number]  Date of Birth: Apr 15, 1935  Date: 07/08/2021  PCP: Venia Carbon, MD  Referral: Venia Carbon, MD  Chief Complaint  Patient presents with  . Hip Pain    Left  . Foot Pain    Right     This visit occurred during the SARS-CoV-2 public health emergency.  Safety protocols were in place, including screening questions prior to the visit, additional usage of staff PPE, and extensive cleaning of exam room while observing appropriate contact time as indicated for disinfecting solutions.   Subjective:   Jennifer Schmitt is a 86 y.o. very pleasant female patient with Body mass index is 32.12 kg/m. who presents with the following:  L hip pain and R foot pain: She is predominantly having pain on the left side of her posterior pelvis.  She denies any anterior groin pain.  She has not had any specific accident or injury, but she has been not particularly active during COVID-19.  She did go back to the gym recently, and since then for the last 2 weeks she has been hurting in this region of the posterior pelvis as well as having some ankle swelling and pain.  She did have a distant fracture of the ankle.  After she went to the gym she did have pain in the posterior buttocks.  She has not had any numbness, weakness, or radicular symptoms at all. L piriformis  She also has a history of have an L5-S1 previous lumbar spine surgery.  Now she does have pain with walking.    Review of Systems is noted in the HPI, as appropriate   Objective:   BP (!) 142/86   Pulse 71   Temp 97.6 F (36.4 C) (Temporal)   Ht 5' 5.5" (1.664 m)   Wt 196 lb (88.9 kg)   SpO2 96%   BMI 32.12 kg/m   Right foot exam: Nontender throughout all bony anatomy she does have some tenderness  in the interim lateral ankle, but this is grossly unremarkable compared to the contralateral side.  There is some swelling, but the exam is only remarkable from a loss of motion standpoint which is approximately 20% less globally compared to the contralateral side   HIP EXAM: SIDE: Left ROM: Abduction, Flexion, Internal and External range of motion: Full Pain with terminal IROM and EROM: Minimal GTB: Minimal SLR: NEG Knees: No effusion FABER: NT REVERSE FABER: Some tenderness  piriformis: Pain to direct palpation on the left  Str: flexion: 4/5 abduction: 3+/5 adduction: 4/5 Strength testing non-tender    Radiology: No results found.  Assessment and Plan:     ICD-10-CM   1. Piriformis syndrome of left side  G57.02 Ambulatory referral to Physical Therapy    2. Acute left-sided low back pain without sciatica  M54.50 Ambulatory referral to Physical Therapy    3. Weakness of both hips  R29.898 Ambulatory referral to Physical Therapy    4. Chronic pain of right ankle  M25.571    G89.29      Acute on chronic pain with some weakness and flared up piriformis, and I suspect that this is due to deconditioning with return to the gym faster than would be have been ideal.  Not particularly concerned about her ankle, and I suspect  that this is flared up some after her posterior pelvis is flared up.  I think that this is predominantly mechanical issue, so I am can have her do physical therapy to work on her stability and strength.  Social: This is impairing her ability to do even basic exercise.  Orders Placed This Encounter  Procedures  . Ambulatory referral to Physical Therapy    Follow-up: No follow-ups on file.  Signed,  Maud Deed. Romar Woodrick, MD   Outpatient Encounter Medications as of 07/08/2021  Medication Sig  . acetaminophen (TYLENOL) 500 MG tablet Take 1,000 mg by mouth as needed.  . Cholecalciferol (VITAMIN D) 2000 units CAPS Take 2,000 Units by mouth daily.  .  cyanocobalamin (,VITAMIN B-12,) 1000 MCG/ML injection INJECT 1 ML MONTHLY AS DIRECTED  . diltiazem (CARDIZEM) 30 MG tablet Take 1 tablet (30 mg total) by mouth 3 (three) times daily as needed (atrial fibrillation lasting more than 2 hours).  . Estradiol (IMVEXXY MAINTENANCE PACK) 4 MCG INST Place 1 ampule vaginally 2 (two) times a week.  . folic acid (FOLVITE) 1 MG tablet TAKE 1 TABLET BY MOUTH EVERY DAY  . hydrocortisone 2.5 % cream Apply topically 3 (three) times daily as needed.  Marland Kitchen LORazepam (ATIVAN) 0.5 MG tablet Take 0.5-1 tablets (0.25-0.5 mg total) by mouth 2 (two) times daily as needed for anxiety.  . metoprolol succinate (TOPROL-XL) 25 MG 24 hr tablet TAKE 1 TABLET(25 MG) BY MOUTH DAILY  . propranolol (INDERAL) 10 MG tablet Take 1 tablet (10 mg total) by mouth 3 (three) times daily as needed (atrial fibrillation lasting more than 2 hours).  . rivaroxaban (XARELTO) 20 MG TABS tablet TAKE 1 TABLET BY MOUTH EVERY DAY WITH EVENING MEAL  . rosuvastatin (CRESTOR) 5 MG tablet Take 1 tablet (5 mg total) by mouth daily.  Loura Pardon Salicylate (ASPERCREME EX) Apply topically as needed.  . valsartan (DIOVAN) 80 MG tablet TAKE 1 TABLET(80 MG) BY MOUTH DAILY   No facility-administered encounter medications on file as of 07/08/2021.

## 2021-07-16 DIAGNOSIS — G5702 Lesion of sciatic nerve, left lower limb: Secondary | ICD-10-CM | POA: Diagnosis not present

## 2021-07-16 DIAGNOSIS — R29898 Other symptoms and signs involving the musculoskeletal system: Secondary | ICD-10-CM | POA: Diagnosis not present

## 2021-07-16 DIAGNOSIS — M545 Low back pain, unspecified: Secondary | ICD-10-CM | POA: Diagnosis not present

## 2021-07-16 DIAGNOSIS — M25571 Pain in right ankle and joints of right foot: Secondary | ICD-10-CM | POA: Diagnosis not present

## 2021-07-16 DIAGNOSIS — R2689 Other abnormalities of gait and mobility: Secondary | ICD-10-CM | POA: Diagnosis not present

## 2021-07-16 DIAGNOSIS — M25552 Pain in left hip: Secondary | ICD-10-CM | POA: Diagnosis not present

## 2021-07-19 DIAGNOSIS — M25571 Pain in right ankle and joints of right foot: Secondary | ICD-10-CM | POA: Diagnosis not present

## 2021-07-19 DIAGNOSIS — M25552 Pain in left hip: Secondary | ICD-10-CM | POA: Diagnosis not present

## 2021-07-19 DIAGNOSIS — R29898 Other symptoms and signs involving the musculoskeletal system: Secondary | ICD-10-CM | POA: Diagnosis not present

## 2021-07-19 DIAGNOSIS — G5702 Lesion of sciatic nerve, left lower limb: Secondary | ICD-10-CM | POA: Diagnosis not present

## 2021-07-19 DIAGNOSIS — R2689 Other abnormalities of gait and mobility: Secondary | ICD-10-CM | POA: Diagnosis not present

## 2021-07-19 DIAGNOSIS — M545 Low back pain, unspecified: Secondary | ICD-10-CM | POA: Diagnosis not present

## 2021-07-24 DIAGNOSIS — M25552 Pain in left hip: Secondary | ICD-10-CM | POA: Diagnosis not present

## 2021-07-24 DIAGNOSIS — R2689 Other abnormalities of gait and mobility: Secondary | ICD-10-CM | POA: Diagnosis not present

## 2021-07-24 DIAGNOSIS — M3501 Sicca syndrome with keratoconjunctivitis: Secondary | ICD-10-CM | POA: Diagnosis not present

## 2021-07-24 DIAGNOSIS — M25571 Pain in right ankle and joints of right foot: Secondary | ICD-10-CM | POA: Diagnosis not present

## 2021-07-24 DIAGNOSIS — G5702 Lesion of sciatic nerve, left lower limb: Secondary | ICD-10-CM | POA: Diagnosis not present

## 2021-07-24 DIAGNOSIS — M545 Low back pain, unspecified: Secondary | ICD-10-CM | POA: Diagnosis not present

## 2021-07-24 DIAGNOSIS — R29898 Other symptoms and signs involving the musculoskeletal system: Secondary | ICD-10-CM | POA: Diagnosis not present

## 2021-07-25 DIAGNOSIS — R2689 Other abnormalities of gait and mobility: Secondary | ICD-10-CM | POA: Diagnosis not present

## 2021-07-25 DIAGNOSIS — M25552 Pain in left hip: Secondary | ICD-10-CM | POA: Diagnosis not present

## 2021-07-25 DIAGNOSIS — G5702 Lesion of sciatic nerve, left lower limb: Secondary | ICD-10-CM | POA: Diagnosis not present

## 2021-07-25 DIAGNOSIS — R29898 Other symptoms and signs involving the musculoskeletal system: Secondary | ICD-10-CM | POA: Diagnosis not present

## 2021-07-25 DIAGNOSIS — M545 Low back pain, unspecified: Secondary | ICD-10-CM | POA: Diagnosis not present

## 2021-07-25 DIAGNOSIS — M25571 Pain in right ankle and joints of right foot: Secondary | ICD-10-CM | POA: Diagnosis not present

## 2021-07-29 DIAGNOSIS — R2689 Other abnormalities of gait and mobility: Secondary | ICD-10-CM | POA: Diagnosis not present

## 2021-07-29 DIAGNOSIS — M25571 Pain in right ankle and joints of right foot: Secondary | ICD-10-CM | POA: Diagnosis not present

## 2021-07-29 DIAGNOSIS — M25552 Pain in left hip: Secondary | ICD-10-CM | POA: Diagnosis not present

## 2021-07-29 DIAGNOSIS — R29898 Other symptoms and signs involving the musculoskeletal system: Secondary | ICD-10-CM | POA: Diagnosis not present

## 2021-07-29 DIAGNOSIS — G5702 Lesion of sciatic nerve, left lower limb: Secondary | ICD-10-CM | POA: Diagnosis not present

## 2021-07-29 DIAGNOSIS — M545 Low back pain, unspecified: Secondary | ICD-10-CM | POA: Diagnosis not present

## 2021-07-31 DIAGNOSIS — M25552 Pain in left hip: Secondary | ICD-10-CM | POA: Diagnosis not present

## 2021-07-31 DIAGNOSIS — G5702 Lesion of sciatic nerve, left lower limb: Secondary | ICD-10-CM | POA: Diagnosis not present

## 2021-07-31 DIAGNOSIS — R29898 Other symptoms and signs involving the musculoskeletal system: Secondary | ICD-10-CM | POA: Diagnosis not present

## 2021-07-31 DIAGNOSIS — M545 Low back pain, unspecified: Secondary | ICD-10-CM | POA: Diagnosis not present

## 2021-07-31 DIAGNOSIS — M25571 Pain in right ankle and joints of right foot: Secondary | ICD-10-CM | POA: Diagnosis not present

## 2021-07-31 DIAGNOSIS — R2689 Other abnormalities of gait and mobility: Secondary | ICD-10-CM | POA: Diagnosis not present

## 2021-08-01 DIAGNOSIS — G5702 Lesion of sciatic nerve, left lower limb: Secondary | ICD-10-CM | POA: Diagnosis not present

## 2021-08-01 DIAGNOSIS — M545 Low back pain, unspecified: Secondary | ICD-10-CM | POA: Diagnosis not present

## 2021-08-01 DIAGNOSIS — R2689 Other abnormalities of gait and mobility: Secondary | ICD-10-CM | POA: Diagnosis not present

## 2021-08-01 DIAGNOSIS — R29898 Other symptoms and signs involving the musculoskeletal system: Secondary | ICD-10-CM | POA: Diagnosis not present

## 2021-08-01 DIAGNOSIS — M25552 Pain in left hip: Secondary | ICD-10-CM | POA: Diagnosis not present

## 2021-08-01 DIAGNOSIS — M25571 Pain in right ankle and joints of right foot: Secondary | ICD-10-CM | POA: Diagnosis not present

## 2021-08-05 DIAGNOSIS — M25552 Pain in left hip: Secondary | ICD-10-CM | POA: Diagnosis not present

## 2021-08-05 DIAGNOSIS — R2689 Other abnormalities of gait and mobility: Secondary | ICD-10-CM | POA: Diagnosis not present

## 2021-08-05 DIAGNOSIS — G5702 Lesion of sciatic nerve, left lower limb: Secondary | ICD-10-CM | POA: Diagnosis not present

## 2021-08-05 DIAGNOSIS — M25571 Pain in right ankle and joints of right foot: Secondary | ICD-10-CM | POA: Diagnosis not present

## 2021-08-05 DIAGNOSIS — R29898 Other symptoms and signs involving the musculoskeletal system: Secondary | ICD-10-CM | POA: Diagnosis not present

## 2021-08-05 DIAGNOSIS — M545 Low back pain, unspecified: Secondary | ICD-10-CM | POA: Diagnosis not present

## 2021-08-08 DIAGNOSIS — M545 Low back pain, unspecified: Secondary | ICD-10-CM | POA: Diagnosis not present

## 2021-08-08 DIAGNOSIS — M25571 Pain in right ankle and joints of right foot: Secondary | ICD-10-CM | POA: Diagnosis not present

## 2021-08-08 DIAGNOSIS — M25552 Pain in left hip: Secondary | ICD-10-CM | POA: Diagnosis not present

## 2021-08-08 DIAGNOSIS — R2689 Other abnormalities of gait and mobility: Secondary | ICD-10-CM | POA: Diagnosis not present

## 2021-08-08 DIAGNOSIS — G5702 Lesion of sciatic nerve, left lower limb: Secondary | ICD-10-CM | POA: Diagnosis not present

## 2021-08-08 DIAGNOSIS — R29898 Other symptoms and signs involving the musculoskeletal system: Secondary | ICD-10-CM | POA: Diagnosis not present

## 2021-08-09 DIAGNOSIS — M25552 Pain in left hip: Secondary | ICD-10-CM | POA: Diagnosis not present

## 2021-08-09 DIAGNOSIS — R29898 Other symptoms and signs involving the musculoskeletal system: Secondary | ICD-10-CM | POA: Diagnosis not present

## 2021-08-09 DIAGNOSIS — M25571 Pain in right ankle and joints of right foot: Secondary | ICD-10-CM | POA: Diagnosis not present

## 2021-08-09 DIAGNOSIS — R2689 Other abnormalities of gait and mobility: Secondary | ICD-10-CM | POA: Diagnosis not present

## 2021-08-09 DIAGNOSIS — G5702 Lesion of sciatic nerve, left lower limb: Secondary | ICD-10-CM | POA: Diagnosis not present

## 2021-08-09 DIAGNOSIS — M545 Low back pain, unspecified: Secondary | ICD-10-CM | POA: Diagnosis not present

## 2021-08-12 DIAGNOSIS — M545 Low back pain, unspecified: Secondary | ICD-10-CM | POA: Diagnosis not present

## 2021-08-12 DIAGNOSIS — R2689 Other abnormalities of gait and mobility: Secondary | ICD-10-CM | POA: Diagnosis not present

## 2021-08-12 DIAGNOSIS — M25552 Pain in left hip: Secondary | ICD-10-CM | POA: Diagnosis not present

## 2021-08-12 DIAGNOSIS — G5702 Lesion of sciatic nerve, left lower limb: Secondary | ICD-10-CM | POA: Diagnosis not present

## 2021-08-12 DIAGNOSIS — M25571 Pain in right ankle and joints of right foot: Secondary | ICD-10-CM | POA: Diagnosis not present

## 2021-08-12 DIAGNOSIS — R29898 Other symptoms and signs involving the musculoskeletal system: Secondary | ICD-10-CM | POA: Diagnosis not present

## 2021-08-14 ENCOUNTER — Other Ambulatory Visit: Payer: Self-pay | Admitting: Internal Medicine

## 2021-08-14 DIAGNOSIS — J300 Vasomotor rhinitis: Secondary | ICD-10-CM | POA: Diagnosis not present

## 2021-08-14 DIAGNOSIS — J309 Allergic rhinitis, unspecified: Secondary | ICD-10-CM | POA: Diagnosis not present

## 2021-08-15 DIAGNOSIS — R2689 Other abnormalities of gait and mobility: Secondary | ICD-10-CM | POA: Diagnosis not present

## 2021-08-15 DIAGNOSIS — G5702 Lesion of sciatic nerve, left lower limb: Secondary | ICD-10-CM | POA: Diagnosis not present

## 2021-08-15 DIAGNOSIS — M545 Low back pain, unspecified: Secondary | ICD-10-CM | POA: Diagnosis not present

## 2021-08-15 DIAGNOSIS — M25571 Pain in right ankle and joints of right foot: Secondary | ICD-10-CM | POA: Diagnosis not present

## 2021-08-15 DIAGNOSIS — R29898 Other symptoms and signs involving the musculoskeletal system: Secondary | ICD-10-CM | POA: Diagnosis not present

## 2021-08-15 DIAGNOSIS — M25552 Pain in left hip: Secondary | ICD-10-CM | POA: Diagnosis not present

## 2021-08-15 NOTE — Telephone Encounter (Signed)
Last filled 06-26-21 #30 Last OV 07-08-21 Next OV 08-22-21 [facility] [address]. Jennifer Schmitt

## 2021-08-16 DIAGNOSIS — M25552 Pain in left hip: Secondary | ICD-10-CM | POA: Diagnosis not present

## 2021-08-16 DIAGNOSIS — R29898 Other symptoms and signs involving the musculoskeletal system: Secondary | ICD-10-CM | POA: Diagnosis not present

## 2021-08-16 DIAGNOSIS — M25571 Pain in right ankle and joints of right foot: Secondary | ICD-10-CM | POA: Diagnosis not present

## 2021-08-16 DIAGNOSIS — G5702 Lesion of sciatic nerve, left lower limb: Secondary | ICD-10-CM | POA: Diagnosis not present

## 2021-08-16 DIAGNOSIS — M545 Low back pain, unspecified: Secondary | ICD-10-CM | POA: Diagnosis not present

## 2021-08-16 DIAGNOSIS — R2689 Other abnormalities of gait and mobility: Secondary | ICD-10-CM | POA: Diagnosis not present

## 2021-08-19 DIAGNOSIS — R29898 Other symptoms and signs involving the musculoskeletal system: Secondary | ICD-10-CM | POA: Diagnosis not present

## 2021-08-19 DIAGNOSIS — M545 Low back pain, unspecified: Secondary | ICD-10-CM | POA: Diagnosis not present

## 2021-08-19 DIAGNOSIS — G5702 Lesion of sciatic nerve, left lower limb: Secondary | ICD-10-CM | POA: Diagnosis not present

## 2021-08-19 DIAGNOSIS — R2689 Other abnormalities of gait and mobility: Secondary | ICD-10-CM | POA: Diagnosis not present

## 2021-08-19 DIAGNOSIS — M25571 Pain in right ankle and joints of right foot: Secondary | ICD-10-CM | POA: Diagnosis not present

## 2021-08-19 DIAGNOSIS — M25552 Pain in left hip: Secondary | ICD-10-CM | POA: Diagnosis not present

## 2021-08-21 DIAGNOSIS — H353131 Nonexudative age-related macular degeneration, bilateral, early dry stage: Secondary | ICD-10-CM | POA: Diagnosis not present

## 2021-08-22 ENCOUNTER — Ambulatory Visit: Payer: Medicare PPO | Admitting: Internal Medicine

## 2021-08-22 ENCOUNTER — Other Ambulatory Visit: Payer: Self-pay

## 2021-08-22 ENCOUNTER — Encounter: Payer: Self-pay | Admitting: Internal Medicine

## 2021-08-22 DIAGNOSIS — M25552 Pain in left hip: Secondary | ICD-10-CM | POA: Diagnosis not present

## 2021-08-22 DIAGNOSIS — G5702 Lesion of sciatic nerve, left lower limb: Secondary | ICD-10-CM | POA: Diagnosis not present

## 2021-08-22 DIAGNOSIS — M545 Low back pain, unspecified: Secondary | ICD-10-CM | POA: Diagnosis not present

## 2021-08-22 DIAGNOSIS — F39 Unspecified mood [affective] disorder: Secondary | ICD-10-CM

## 2021-08-22 DIAGNOSIS — R2689 Other abnormalities of gait and mobility: Secondary | ICD-10-CM | POA: Diagnosis not present

## 2021-08-22 DIAGNOSIS — M25571 Pain in right ankle and joints of right foot: Secondary | ICD-10-CM | POA: Diagnosis not present

## 2021-08-22 DIAGNOSIS — R29898 Other symptoms and signs involving the musculoskeletal system: Secondary | ICD-10-CM | POA: Diagnosis not present

## 2021-08-22 NOTE — Progress Notes (Signed)
Subjective:    Patient ID: Jennifer Schmitt, female    DOB: 03-07-1935, 86 y.o.   MRN: [id number]  HPI Here for follow up of her pain syndrome This visit occurred during the SARS-CoV-2 public health emergency.  Safety protocols were in place, including screening questions prior to the visit, additional usage of staff PPE, and extensive cleaning of exam room while observing appropriate contact time as indicated for disinfecting solutions.   She has been going to PT Pain is better--by the pirformis Still with some aches  Anxiety is better---using lorazepam (1/2 bid) When she skips--she can feel her teeth clenching Ongoing stress with husband's health issues (multiple skin cancers)  Current Outpatient Medications on File Prior to Visit  Medication Sig Dispense Refill   acetaminophen (TYLENOL) 500 MG tablet Take 1,000 mg by mouth as needed.     Cholecalciferol (VITAMIN D) 2000 units CAPS Take 2,000 Units by mouth daily.     cyanocobalamin (,VITAMIN B-12,) 1000 MCG/ML injection INJECT 1 ML MONTHLY AS DIRECTED 10 mL 1   diltiazem (CARDIZEM) 30 MG tablet Take 1 tablet (30 mg total) by mouth 3 (three) times daily as needed (atrial fibrillation lasting more than 2 hours). 10 tablet 6   Estradiol (IMVEXXY MAINTENANCE PACK) 4 MCG INST Place 1 ampule vaginally 2 (two) times a week. 24 each 3   fluticasone (FLONASE ALLERGY RELIEF) 50 MCG/ACT nasal spray Place 1 spray into both nostrils daily.     folic acid (FOLVITE) 1 MG tablet TAKE 1 TABLET BY MOUTH EVERY DAY 90 tablet 3   hydrocortisone 2.5 % cream Apply topically 3 (three) times daily as needed. 28 g 3   ipratropium (ATROVENT) 0.03 % nasal spray Place 2 sprays into both nostrils every 12 (twelve) hours.     levocetirizine (XYZAL) 5 MG tablet Take 5 mg by mouth every evening.     LORazepam (ATIVAN) 0.5 MG tablet TAKE 1/2 TO 1 TABLET(0.25 TO 0.5 MG) BY MOUTH TWICE DAILY AS NEEDED FOR ANXIETY 30 tablet 0   metoprolol succinate (TOPROL-XL) 25 MG 24  hr tablet TAKE 1 TABLET(25 MG) BY MOUTH DAILY 90 tablet 1   propranolol (INDERAL) 10 MG tablet Take 1 tablet (10 mg total) by mouth 3 (three) times daily as needed (atrial fibrillation lasting more than 2 hours). 10 tablet 6   rivaroxaban (XARELTO) 20 MG TABS tablet TAKE 1 TABLET BY MOUTH EVERY DAY WITH EVENING MEAL 90 tablet 3   rosuvastatin (CRESTOR) 5 MG tablet Take 1 tablet (5 mg total) by mouth daily. 90 tablet 3   Trolamine Salicylate (ASPERCREME EX) Apply topically as needed.     valsartan (DIOVAN) 80 MG tablet TAKE 1 TABLET(80 MG) BY MOUTH DAILY 90 tablet 3   No current facility-administered medications on file prior to visit.    Allergies  Allergen Reactions   Actonel [Risedronate Sodium] Nausea And Vomiting   Boniva [Ibandronic Acid] Nausea And Vomiting   Diclofenac Other (See Comments)    Voltaren Gel aggravated her sinus passages   Fosamax [Alendronate Sodium] Nausea And Vomiting   Lipitor [Atorvastatin] Other (See Comments)    Muscle aches. Tolerates Crestor.   Miacalcin [Calcitonin (Sal[place])] Other (See Comments)    Sneezing    Myrbetriq [Mirabegron] Other (See Comments)    Gastritis   Talwin [Pentazocine] Other (See Comments)    Hallucinations    Latex Rash    Sneezing, watery eyes.   Niaspan [Niacin Er] Swelling and Rash    Past Medical History:  Diagnosis Date   Adenomatous colon polyp    Allergic rhinitis due to pollen    Anemia    Anxiety    Bronchiectasis ([place])    Complication of anesthesia    Degenerative disc disease    cervical and lumbar   Dysrhythmia    GERD (gastroesophageal reflux disease)    Hearing loss in right ear    Heart murmur    HOH (hard of hearing)    AIDS   Hyperlipidemia    Hypertension    Osteoarthritis, multiple sites    PAF (paroxysmal atrial fibrillation) ([place])    a. 01/2013 [facility]): EF 60-65%, no rwma, mildly dil LA/RA, mild AI/TR, trace MR, nl RV size/fxn, mild PAH; b. CHA2DS2VASc = 4-->Chronic Xarelto.    PMR (polymyalgia rheumatica) (HCC)    PONV (postoperative nausea and vomiting)    Rheumatic fever    Sleep apnea    NO CPAP   Urge incontinence    Vertigo    Vertigo     Past Surgical History:  Procedure Laterality Date   ABDOMINAL HYSTERECTOMY     ANKLE FRACTURE SURGERY Left 1996   BACK SURGERY     BREAST BIOPSY Left 2010   negative   CATARACT EXTRACTION W/PHACO Left 10/05/2018   Procedure: CATARACT EXTRACTION PHACO AND INTRAOCULAR LENS PLACEMENT ([place]);  Surgeon: Birder Robson, MD;  Location: [facility];  Service: Ophthalmology;  Laterality: Left;  [place] 00:40 CDE 6.92 fluid pack lot # [id number]      CATARACT EXTRACTION W/PHACO Right 10/26/2018   Procedure: CATARACT EXTRACTION PHACO AND INTRAOCULAR LENS PLACEMENT ([facility]);  Surgeon: Birder Robson, MD;  Location: [facility];  Service: Ophthalmology;  Laterality: Right;  [place]  00:47 CDE 7.11 Fluid pack lot # [id number]   DEXA  11/09/06   normal   FRACTURE SURGERY     Meningitis  1963   SPINE SURGERY     SYNOVECTOMY Right 1973   elbow   TONSILLECTOMY AND ADENOIDECTOMY      Family History  Problem Relation Age of Onset   Schizophrenia Mother        paranoid   Heart disease Mother        from psych meds   Arthritis Mother    Rheum arthritis Mother    Cancer Father        prostate   Arthritis Father    Diabetes Father    Ovarian cancer Maternal Grandmother    Uterine cancer Maternal Grandmother    Colon cancer Maternal Grandmother    Breast cancer Paternal Aunt 80    Social History   Socioeconomic History   Marital status: Married    Spouse name: Not on file   Number of children: 1   Years of education: Not on file   Highest education level: Not on file  Occupational History   Occupation: Scientist, physiological and college professor    Comment: [employer]--PhD in Special education  Tobacco Use   Smoking status: Never   Smokeless tobacco: Never  Vaping Use   Vaping Use: Never used  Substance and Sexual Activity    Alcohol use: Yes    Comment: OCCAS   Drug use: No   Sexual activity: Not on file  Other Topics Concern   Not on file  Social History Narrative   Retired 2007    1 son in [place] area   Spends part time in [place]      Has living will   Husband then son is health  care POA   Not sure about DNR---will keep open resuscitation for now    No tube feedings if cognitively unaware               Social Determinants of Health   Financial Resource Strain: Not on file  Food Insecurity: Not on file  Transportation Needs: Not on file  Physical Activity: Not on file  Stress: Not on file  Social Connections: Not on file  Intimate Partner Violence: Not on file   Review of Systems Sleeps fairly well Has used some tylenol PM--counseled about care with this (or don't have any) Eating well--"too well"    Objective:   Physical Exam Constitutional:      Appearance: Normal appearance.  Neurological:     Mental Status: She is alert.  Psychiatric:        Mood and Affect: Mood normal.        Behavior: Behavior normal.           Assessment & Plan:

## 2021-08-22 NOTE — Assessment & Plan Note (Signed)
Ongoing stress with husband with dementia She is better with the lorazepam---discussed trying to keep it prn Skip the tylenol PM Discussed respite--she can go out and leave him alone for a while for now Won't go to [employer]

## 2021-08-26 DIAGNOSIS — M25571 Pain in right ankle and joints of right foot: Secondary | ICD-10-CM | POA: Diagnosis not present

## 2021-08-26 DIAGNOSIS — G5702 Lesion of sciatic nerve, left lower limb: Secondary | ICD-10-CM | POA: Diagnosis not present

## 2021-08-26 DIAGNOSIS — R2689 Other abnormalities of gait and mobility: Secondary | ICD-10-CM | POA: Diagnosis not present

## 2021-08-26 DIAGNOSIS — M545 Low back pain, unspecified: Secondary | ICD-10-CM | POA: Diagnosis not present

## 2021-08-26 DIAGNOSIS — R29898 Other symptoms and signs involving the musculoskeletal system: Secondary | ICD-10-CM | POA: Diagnosis not present

## 2021-08-26 DIAGNOSIS — M25552 Pain in left hip: Secondary | ICD-10-CM | POA: Diagnosis not present

## 2021-08-28 DIAGNOSIS — M545 Low back pain, unspecified: Secondary | ICD-10-CM | POA: Diagnosis not present

## 2021-08-28 DIAGNOSIS — R29898 Other symptoms and signs involving the musculoskeletal system: Secondary | ICD-10-CM | POA: Diagnosis not present

## 2021-08-28 DIAGNOSIS — M25571 Pain in right ankle and joints of right foot: Secondary | ICD-10-CM | POA: Diagnosis not present

## 2021-08-28 DIAGNOSIS — R2689 Other abnormalities of gait and mobility: Secondary | ICD-10-CM | POA: Diagnosis not present

## 2021-08-28 DIAGNOSIS — G5702 Lesion of sciatic nerve, left lower limb: Secondary | ICD-10-CM | POA: Diagnosis not present

## 2021-08-28 DIAGNOSIS — M25552 Pain in left hip: Secondary | ICD-10-CM | POA: Diagnosis not present

## 2021-08-30 DIAGNOSIS — M25552 Pain in left hip: Secondary | ICD-10-CM | POA: Diagnosis not present

## 2021-08-30 DIAGNOSIS — M545 Low back pain, unspecified: Secondary | ICD-10-CM | POA: Diagnosis not present

## 2021-08-30 DIAGNOSIS — R29898 Other symptoms and signs involving the musculoskeletal system: Secondary | ICD-10-CM | POA: Diagnosis not present

## 2021-08-30 DIAGNOSIS — M25571 Pain in right ankle and joints of right foot: Secondary | ICD-10-CM | POA: Diagnosis not present

## 2021-08-30 DIAGNOSIS — G5702 Lesion of sciatic nerve, left lower limb: Secondary | ICD-10-CM | POA: Diagnosis not present

## 2021-08-30 DIAGNOSIS — R2689 Other abnormalities of gait and mobility: Secondary | ICD-10-CM | POA: Diagnosis not present

## 2021-09-02 DIAGNOSIS — M25552 Pain in left hip: Secondary | ICD-10-CM | POA: Diagnosis not present

## 2021-09-02 DIAGNOSIS — M545 Low back pain, unspecified: Secondary | ICD-10-CM | POA: Diagnosis not present

## 2021-09-02 DIAGNOSIS — M25571 Pain in right ankle and joints of right foot: Secondary | ICD-10-CM | POA: Diagnosis not present

## 2021-09-02 DIAGNOSIS — R2689 Other abnormalities of gait and mobility: Secondary | ICD-10-CM | POA: Diagnosis not present

## 2021-09-02 DIAGNOSIS — G5702 Lesion of sciatic nerve, left lower limb: Secondary | ICD-10-CM | POA: Diagnosis not present

## 2021-09-02 DIAGNOSIS — R29898 Other symptoms and signs involving the musculoskeletal system: Secondary | ICD-10-CM | POA: Diagnosis not present

## 2021-09-04 DIAGNOSIS — R2689 Other abnormalities of gait and mobility: Secondary | ICD-10-CM | POA: Diagnosis not present

## 2021-09-04 DIAGNOSIS — G5702 Lesion of sciatic nerve, left lower limb: Secondary | ICD-10-CM | POA: Diagnosis not present

## 2021-09-04 DIAGNOSIS — R29898 Other symptoms and signs involving the musculoskeletal system: Secondary | ICD-10-CM | POA: Diagnosis not present

## 2021-09-04 DIAGNOSIS — M25552 Pain in left hip: Secondary | ICD-10-CM | POA: Diagnosis not present

## 2021-09-04 DIAGNOSIS — M545 Low back pain, unspecified: Secondary | ICD-10-CM | POA: Diagnosis not present

## 2021-09-04 DIAGNOSIS — M25571 Pain in right ankle and joints of right foot: Secondary | ICD-10-CM | POA: Diagnosis not present

## 2021-09-06 DIAGNOSIS — R2689 Other abnormalities of gait and mobility: Secondary | ICD-10-CM | POA: Diagnosis not present

## 2021-09-06 DIAGNOSIS — G5702 Lesion of sciatic nerve, left lower limb: Secondary | ICD-10-CM | POA: Diagnosis not present

## 2021-09-06 DIAGNOSIS — R29898 Other symptoms and signs involving the musculoskeletal system: Secondary | ICD-10-CM | POA: Diagnosis not present

## 2021-09-06 DIAGNOSIS — M25552 Pain in left hip: Secondary | ICD-10-CM | POA: Diagnosis not present

## 2021-09-06 DIAGNOSIS — M25571 Pain in right ankle and joints of right foot: Secondary | ICD-10-CM | POA: Diagnosis not present

## 2021-09-06 DIAGNOSIS — M545 Low back pain, unspecified: Secondary | ICD-10-CM | POA: Diagnosis not present

## 2021-09-09 DIAGNOSIS — M25552 Pain in left hip: Secondary | ICD-10-CM | POA: Diagnosis not present

## 2021-09-09 DIAGNOSIS — M25571 Pain in right ankle and joints of right foot: Secondary | ICD-10-CM | POA: Diagnosis not present

## 2021-09-09 DIAGNOSIS — R29898 Other symptoms and signs involving the musculoskeletal system: Secondary | ICD-10-CM | POA: Diagnosis not present

## 2021-09-09 DIAGNOSIS — G5702 Lesion of sciatic nerve, left lower limb: Secondary | ICD-10-CM | POA: Diagnosis not present

## 2021-09-09 DIAGNOSIS — M545 Low back pain, unspecified: Secondary | ICD-10-CM | POA: Diagnosis not present

## 2021-09-09 DIAGNOSIS — R2689 Other abnormalities of gait and mobility: Secondary | ICD-10-CM | POA: Diagnosis not present

## 2021-09-11 DIAGNOSIS — M25552 Pain in left hip: Secondary | ICD-10-CM | POA: Diagnosis not present

## 2021-09-11 DIAGNOSIS — R2689 Other abnormalities of gait and mobility: Secondary | ICD-10-CM | POA: Diagnosis not present

## 2021-09-11 DIAGNOSIS — G5702 Lesion of sciatic nerve, left lower limb: Secondary | ICD-10-CM | POA: Diagnosis not present

## 2021-09-11 DIAGNOSIS — R29898 Other symptoms and signs involving the musculoskeletal system: Secondary | ICD-10-CM | POA: Diagnosis not present

## 2021-09-11 DIAGNOSIS — M545 Low back pain, unspecified: Secondary | ICD-10-CM | POA: Diagnosis not present

## 2021-09-11 DIAGNOSIS — M25571 Pain in right ankle and joints of right foot: Secondary | ICD-10-CM | POA: Diagnosis not present

## 2021-09-13 DIAGNOSIS — G5702 Lesion of sciatic nerve, left lower limb: Secondary | ICD-10-CM | POA: Diagnosis not present

## 2021-09-13 DIAGNOSIS — M25571 Pain in right ankle and joints of right foot: Secondary | ICD-10-CM | POA: Diagnosis not present

## 2021-09-13 DIAGNOSIS — R2689 Other abnormalities of gait and mobility: Secondary | ICD-10-CM | POA: Diagnosis not present

## 2021-09-13 DIAGNOSIS — R29898 Other symptoms and signs involving the musculoskeletal system: Secondary | ICD-10-CM | POA: Diagnosis not present

## 2021-09-13 DIAGNOSIS — M25552 Pain in left hip: Secondary | ICD-10-CM | POA: Diagnosis not present

## 2021-09-13 DIAGNOSIS — M545 Low back pain, unspecified: Secondary | ICD-10-CM | POA: Diagnosis not present

## 2021-09-16 DIAGNOSIS — R29898 Other symptoms and signs involving the musculoskeletal system: Secondary | ICD-10-CM | POA: Diagnosis not present

## 2021-09-16 DIAGNOSIS — M25552 Pain in left hip: Secondary | ICD-10-CM | POA: Diagnosis not present

## 2021-09-16 DIAGNOSIS — M545 Low back pain, unspecified: Secondary | ICD-10-CM | POA: Diagnosis not present

## 2021-09-16 DIAGNOSIS — M25571 Pain in right ankle and joints of right foot: Secondary | ICD-10-CM | POA: Diagnosis not present

## 2021-09-16 DIAGNOSIS — R2689 Other abnormalities of gait and mobility: Secondary | ICD-10-CM | POA: Diagnosis not present

## 2021-09-16 DIAGNOSIS — G5702 Lesion of sciatic nerve, left lower limb: Secondary | ICD-10-CM | POA: Diagnosis not present

## 2021-09-18 DIAGNOSIS — R29898 Other symptoms and signs involving the musculoskeletal system: Secondary | ICD-10-CM | POA: Diagnosis not present

## 2021-09-18 DIAGNOSIS — M25552 Pain in left hip: Secondary | ICD-10-CM | POA: Diagnosis not present

## 2021-09-18 DIAGNOSIS — M25571 Pain in right ankle and joints of right foot: Secondary | ICD-10-CM | POA: Diagnosis not present

## 2021-09-18 DIAGNOSIS — M545 Low back pain, unspecified: Secondary | ICD-10-CM | POA: Diagnosis not present

## 2021-09-18 DIAGNOSIS — R2689 Other abnormalities of gait and mobility: Secondary | ICD-10-CM | POA: Diagnosis not present

## 2021-09-18 DIAGNOSIS — G5702 Lesion of sciatic nerve, left lower limb: Secondary | ICD-10-CM | POA: Diagnosis not present

## 2021-09-23 DIAGNOSIS — M545 Low back pain, unspecified: Secondary | ICD-10-CM | POA: Diagnosis not present

## 2021-09-23 DIAGNOSIS — R2689 Other abnormalities of gait and mobility: Secondary | ICD-10-CM | POA: Diagnosis not present

## 2021-09-23 DIAGNOSIS — R29898 Other symptoms and signs involving the musculoskeletal system: Secondary | ICD-10-CM | POA: Diagnosis not present

## 2021-09-23 DIAGNOSIS — G5702 Lesion of sciatic nerve, left lower limb: Secondary | ICD-10-CM | POA: Diagnosis not present

## 2021-09-23 DIAGNOSIS — M25571 Pain in right ankle and joints of right foot: Secondary | ICD-10-CM | POA: Diagnosis not present

## 2021-09-23 DIAGNOSIS — M25552 Pain in left hip: Secondary | ICD-10-CM | POA: Diagnosis not present

## 2021-09-24 DIAGNOSIS — D1801 Hemangioma of skin and subcutaneous tissue: Secondary | ICD-10-CM | POA: Diagnosis not present

## 2021-09-24 DIAGNOSIS — D485 Neoplasm of uncertain behavior of skin: Secondary | ICD-10-CM | POA: Diagnosis not present

## 2021-09-24 DIAGNOSIS — L821 Other seborrheic keratosis: Secondary | ICD-10-CM | POA: Diagnosis not present

## 2021-09-24 DIAGNOSIS — L814 Other melanin hyperpigmentation: Secondary | ICD-10-CM | POA: Diagnosis not present

## 2021-09-24 DIAGNOSIS — L570 Actinic keratosis: Secondary | ICD-10-CM | POA: Diagnosis not present

## 2021-09-27 DIAGNOSIS — H00019 Hordeolum externum unspecified eye, unspecified eyelid: Secondary | ICD-10-CM | POA: Diagnosis not present

## 2021-10-24 ENCOUNTER — Other Ambulatory Visit: Payer: Self-pay | Admitting: Internal Medicine

## 2021-10-25 NOTE — Telephone Encounter (Signed)
Last filled 08-15-21 #30 Last OV 08-22-21 Next OV 11-29-21 [facility] [address] [employer]

## 2021-10-30 ENCOUNTER — Telehealth: Payer: Self-pay | Admitting: Cardiovascular Disease

## 2021-10-30 ENCOUNTER — Other Ambulatory Visit: Payer: Self-pay | Admitting: Cardiovascular Disease

## 2021-10-30 NOTE — Telephone Encounter (Signed)
Xarelto 20 mg refill request received. Pt is 86 years old, weight- 89.8 kg, Crea- 0.84 on 06/26/21, last seen by Dr. Rockey Situ on 03/20/21, Diagnosis-PAF, CrCl-70.68; Dose is appropriate based on dosing criteria. Will send in refill to requested pharmacy.

## 2021-10-30 NOTE — Telephone Encounter (Signed)
Refill request

## 2021-10-30 NOTE — Telephone Encounter (Signed)
Please review

## 2021-10-30 NOTE — Telephone Encounter (Signed)
*  STAT* If patient is at the pharmacy, call can be transferred to refill team.   1. Which medications need to be refilled? (please list name of each medication and dose if known) xarelto 20 mg po q d   2. Which pharmacy/location (including street and city if local pharmacy) is medication to be sent to? [facility] [address] [place]   3. Do they need a 30 day or 90 day supply? [place]

## 2021-10-30 NOTE — Telephone Encounter (Signed)
Rx previously sent in today.

## 2021-11-07 ENCOUNTER — Telehealth: Payer: Self-pay | Admitting: Obstetrics & Gynecology

## 2021-11-07 NOTE — Telephone Encounter (Signed)
I will continue to see her yearly (due in Jan)

## 2021-11-07 NOTE — Telephone Encounter (Signed)
This pt called in and is wondering if she should continue to see you for her annuals. (At her age, she doesn't know.)   She has seen the urologist that you recommended and is seeing this person for her pelvic sling.  (Not very happy with the urologist.)  Would like to continue with you for her care.

## 2021-11-12 ENCOUNTER — Other Ambulatory Visit: Payer: Self-pay

## 2021-11-12 ENCOUNTER — Encounter: Payer: Self-pay | Admitting: Cardiovascular Disease

## 2021-11-12 ENCOUNTER — Telehealth: Payer: Self-pay

## 2021-11-12 ENCOUNTER — Ambulatory Visit: Payer: Medicare PPO | Admitting: Cardiovascular Disease

## 2021-11-12 VITALS — BP 160/70 | HR 76 | Ht 67.0 in | Wt 191.0 lb

## 2021-11-12 DIAGNOSIS — I10 Essential (primary) hypertension: Secondary | ICD-10-CM | POA: Diagnosis not present

## 2021-11-12 DIAGNOSIS — I480 Paroxysmal atrial fibrillation: Secondary | ICD-10-CM

## 2021-11-12 DIAGNOSIS — E7800 Pure hypercholesterolemia, unspecified: Secondary | ICD-10-CM

## 2021-11-12 DIAGNOSIS — M353 Polymyalgia rheumatica: Secondary | ICD-10-CM

## 2021-11-12 MED ORDER — PROPRANOLOL HCL 10 MG PO TABS: 10.0000 mg | ORAL_TABLET | Freq: Three times a day (TID) | ORAL | 6 refills | Status: DC | PRN

## 2021-11-12 MED ORDER — DILTIAZEM HCL 30 MG PO TABS: 30.0000 mg | ORAL_TABLET | Freq: Three times a day (TID) | ORAL | 3 refills | Status: DC | PRN

## 2021-11-12 MED ORDER — METOPROLOL SUCCINATE ER 50 MG PO TB24: 50.0000 mg | ORAL_TABLET | Freq: Every day | ORAL | 3 refills | Status: DC

## 2021-11-12 NOTE — Progress Notes (Signed)
Cardiology Office Note  Date:  11/12/2021   ID:  Jennifer Schmitt, DOB 1935-10-23, MRN [id number]  PCP:  Venia Carbon, MD   Chief Complaint  Patient presents with   Atrial Fibrillation    Patient c/o shortness of breath with exertion and an episode of A-fib this morning. Medications reviewed by the patient verbally.     HPI:  Jennifer Schmitt is a very pleasant 86 year old woman with history of  paroxysmal atrial fibrillation,  obstructive sleep apnea who does not wear CPAP,  hypertension,  hyperlipidemia  who presents for routine followup of her atrial fibrillation. She spends much of her winter in [place], has a cardiologist there  Afib this morning, 4:30 Am last few hours Had sx Took expired diltiazem, eventually  resolved Does not have frequent episodes Wonders if stress is contributing Husband with dementia, he is sleeping all day, not very active Has supports at [facility]  She is otherwise active, denies shortness of breath or chest pain on exertion PVCs on EKG, she is asymptomatic  Previously treated with prednisone for polymyalgia History of chronic dizziness, vertigo symptoms.  EKG personally reviewed by myself on todays visit Shows normal sinus rhythm rate 76 bpm no significant ST or T wave changes APCs and PVCs noted  Family history Parents with no CAD, no MI Grandmother with CHF  Other past medical history reviewed Carotid u/s 2011: intimal thickening She reports that she had recent screening, was told she had no significant carotid disease. She will bring in the report for our review   05/31/2015 had severe vomiting, went to the emergency room Electrolytes were normal, no atrial fibrillation Lab work reviewed with her today from recent lab draw, total cholesterol 160 B-12 improving, sedimentation rate 40   She reports having an episode of atrial fibrillation in August 2015 It lasted approximately 6 hours. She took diltiazem and propranolol.  she  converted back to normal sinus rhythm   PMH:   has a past medical history of Adenomatous colon polyp, Allergic rhinitis due to pollen, Anemia, Anxiety, Bronchiectasis ([place]), Complication of anesthesia, Degenerative disc disease, Dysrhythmia, GERD (gastroesophageal reflux disease), Hearing loss in right ear, Heart murmur, HOH (hard of hearing), Hyperlipidemia, Hypertension, Osteoarthritis, multiple sites, PAF (paroxysmal atrial fibrillation) ([place]), PMR (polymyalgia rheumatica) (HCC), PONV (postoperative nausea and vomiting), Rheumatic fever, Sleep apnea, Urge incontinence, Vertigo, and Vertigo.  PSH:    Past Surgical History:  Procedure Laterality Date   ABDOMINAL HYSTERECTOMY     ANKLE FRACTURE SURGERY Left 1996   BACK SURGERY     BREAST BIOPSY Left 2010   negative   CATARACT EXTRACTION W/PHACO Left 10/05/2018   Procedure: CATARACT EXTRACTION PHACO AND INTRAOCULAR LENS PLACEMENT ([place]);  Surgeon: Birder Robson, MD;  Location: [facility];  Service: Ophthalmology;  Laterality: Left;  [place] 00:40 CDE 6.92 fluid pack lot # [id number]      CATARACT EXTRACTION W/PHACO Right 10/26/2018   Procedure: CATARACT EXTRACTION PHACO AND INTRAOCULAR LENS PLACEMENT ([facility]);  Surgeon: Birder Robson, MD;  Location: [facility];  Service: Ophthalmology;  Laterality: Right;  [place]  00:47 CDE 7.11 Fluid pack lot # [id number]   DEXA  11/09/06   normal   FRACTURE SURGERY     Meningitis  1963   SPINE SURGERY     SYNOVECTOMY Right 1973   elbow   TONSILLECTOMY AND ADENOIDECTOMY      Current Outpatient Medications  Medication Sig Dispense Refill   acetaminophen (TYLENOL) 500 MG tablet Take 1,000 mg by mouth as  needed.     Cholecalciferol (VITAMIN D) 2000 units CAPS Take 2,000 Units by mouth daily.     cyanocobalamin (,VITAMIN B-12,) 1000 MCG/ML injection INJECT 1 ML MONTHLY AS DIRECTED 10 mL 1   diltiazem (CARDIZEM) 30 MG tablet Take 1 tablet (30 mg total) by mouth 3 (three) times daily as needed (atrial fibrillation  lasting more than 2 hours). 10 tablet 6   Estradiol (IMVEXXY MAINTENANCE PACK) 4 MCG INST Place 1 ampule vaginally 2 (two) times a week. 24 each 3   fluticasone (FLONASE) 50 MCG/ACT nasal spray Place 1 spray into both nostrils daily.     folic acid (FOLVITE) 1 MG tablet TAKE 1 TABLET BY MOUTH EVERY DAY 90 tablet 3   hydrocortisone 2.5 % cream Apply topically 3 (three) times daily as needed. 28 g 3   ipratropium (ATROVENT) 0.03 % nasal spray Place 2 sprays into both nostrils every 12 (twelve) hours.     levocetirizine (XYZAL) 5 MG tablet Take 5 mg by mouth every evening.     LORazepam (ATIVAN) 0.5 MG tablet TAKE 1/2 TO 1 TABLET(0.25 TO 0.5 MG) BY MOUTH TWICE DAILY AS NEEDED FOR ANXIETY 30 tablet 0   metoprolol succinate (TOPROL-XL) 25 MG 24 hr tablet TAKE 1 TABLET(25 MG) BY MOUTH DAILY 90 tablet 1   propranolol (INDERAL) 10 MG tablet Take 1 tablet (10 mg total) by mouth 3 (three) times daily as needed (atrial fibrillation lasting more than 2 hours). 10 tablet 6   rosuvastatin (CRESTOR) 5 MG tablet Take 1 tablet (5 mg total) by mouth daily. 90 tablet 3   Trolamine Salicylate (ASPERCREME EX) Apply topically as needed.     valsartan (DIOVAN) 80 MG tablet TAKE 1 TABLET(80 MG) BY MOUTH DAILY 90 tablet 3   XARELTO 20 MG TABS tablet TAKE 1 TABLET BY MOUTH EVERY DAY WITH THE EVENING MEAL 90 tablet 3   No current facility-administered medications for this visit.    Allergies:   Actonel [risedronate sodium], Boniva [ibandronic acid], Diclofenac, Fosamax [alendronate sodium], Lipitor [atorvastatin], Miacalcin [calcitonin (salmon)], Myrbetriq [mirabegron], Talwin [pentazocine], Latex, and Niaspan [niacin er]   Social History:  The patient  reports that she has never smoked. She has never used smokeless tobacco. She reports current alcohol use. She reports that she does not use drugs.   Family History:   family history includes Arthritis in her father and mother; Breast cancer (age of onset: 51) in her  paternal aunt; Cancer in her father; Colon cancer in her maternal grandmother; Diabetes in her father; Heart disease in her mother; Ovarian cancer in her maternal grandmother; Rheum arthritis in her mother; Schizophrenia in her mother; Uterine cancer in her maternal grandmother.    Review of Systems: Review of Systems  Constitutional: Negative.   Respiratory: Negative.    Cardiovascular: Negative.   Gastrointestinal: Negative.   Musculoskeletal: Negative.   Neurological: Negative.   Psychiatric/Behavioral: Negative.    All other systems reviewed and are negative.  PHYSICAL EXAM: VS:  BP (!) 160/70 (BP Location: Left Arm, Patient Position: Sitting, Cuff Size: Normal)   Pulse 76   Ht 5\' 7"  (1.702 m)   Wt 191 lb (86.6 kg)   SpO2 98%   BMI 29.91 kg/m  , BMI Body mass index is 29.91 kg/m. Constitutional:  oriented to person, place, and time. No distress.  HENT:  Head: Grossly normal Eyes:  no discharge. No scleral icterus.  Neck: No JVD, no carotid bruits  Cardiovascular: Regular rate and rhythm, no murmurs  appreciated Pulmonary/Chest: Clear to auscultation bilaterally, no wheezes or rails Abdominal: Soft.  no distension.  no tenderness.  Musculoskeletal: Normal range of motion Neurological:  normal muscle tone. Coordination normal. No atrophy Skin: Skin warm and dry Psychiatric: normal affect, pleasant  Recent Labs: 06/26/2021: ALT 14; BUN 15; Creatinine, Ser 0.84; Hemoglobin 12.8; Platelets 244.0; Potassium 5.0; Sodium 141    Lipid Panel Lab Results  Component Value Date   CHOL 182 11/21/2020   HDL 72.70 11/21/2020   LDLCALC 84 11/21/2020   TRIG 128.0 11/21/2020      Wt Readings from Last 3 Encounters:  11/12/21 191 lb (86.6 kg)  08/22/21 198 lb (89.8 kg)  07/08/21 196 lb (88.9 kg)     ASSESSMENT AND PLAN:  Paroxysmal atrial fibrillation (HCC) - Plan: EKG 12-Lead No recent episodes Stay on xarelto,  Recommend to increase metoprolol succinate up to 50  daily Renewed diltiazem 30 as needed Also has propranolol to take as needed  Essential hypertension - Plan: EKG 12-Lead Blood pressure elevated today, Numbers reviewed from home, typically well controlled Could take diltiazem 30 for any spikes in blood pressure  Pure hypercholesterolemia On Crestor 5 daily   Total encounter time more than 25 minutes  Greater than 50% was spent in counseling and coordination of care with the patient    No orders of the defined types were placed in this encounter.    Signed, Esmond Plants, M.D., Ph.D. 11/12/2021  [place], [place]

## 2021-11-12 NOTE — Patient Instructions (Addendum)
Medication Instructions:  Please INCREASE metoprolol succinate up to 50 mg daily  If you need a refill on your cardiac medications before your next appointment, please call your pharmacy.   Lab work: No new labs needed  Testing/Procedures: No new testing needed  Follow-Up: At [facility], you and your health needs are our priority.  As part of our continuing mission to provide you with exceptional heart care, we have created designated Provider Care Teams.  These Care Teams include your primary Cardiologist (physician) and Advanced Practice Providers (APPs -  Physician Assistants and Nurse Practitioners) who all work together to provide you with the care you need, when you need it.  You will need a follow up appointment in 12 months  Providers on your designated Care Team:   Murray Hodgkins, NP Christell Faith, PA-C Cadence Kathlen Mody, [place]  COVID-19 Vaccine Information can be found at: [email or web address].uk For questions related to vaccine distribution or appointments, please email [email or web address] or call [phone].

## 2021-11-12 NOTE — Telephone Encounter (Signed)
Patient c/o afib acting up early this AM.   Patient states that her rescue medication is out of date --  she still took it this AM  BP has been all over the place -- but still having afib spells.   Patient agreed to come in today at 3:40

## 2021-11-12 NOTE — Telephone Encounter (Signed)
Pt seen today by Dr. Rockey Situ Increased Metoprolol to 50 mg daily F/u 12 m

## 2021-11-29 ENCOUNTER — Ambulatory Visit (INDEPENDENT_AMBULATORY_CARE_PROVIDER_SITE_OTHER): Payer: Medicare PPO | Admitting: Internal Medicine

## 2021-11-29 ENCOUNTER — Encounter: Payer: Self-pay | Admitting: Internal Medicine

## 2021-11-29 ENCOUNTER — Other Ambulatory Visit: Payer: Self-pay

## 2021-11-29 VITALS — BP 118/80 | HR 61 | Temp 97.6°F | Ht 65.75 in | Wt 189.0 lb

## 2021-11-29 DIAGNOSIS — G3184 Mild cognitive impairment, so stated: Secondary | ICD-10-CM | POA: Diagnosis not present

## 2021-11-29 DIAGNOSIS — I480 Paroxysmal atrial fibrillation: Secondary | ICD-10-CM

## 2021-11-29 DIAGNOSIS — F39 Unspecified mood [affective] disorder: Secondary | ICD-10-CM

## 2021-11-29 DIAGNOSIS — G629 Polyneuropathy, unspecified: Secondary | ICD-10-CM | POA: Diagnosis not present

## 2021-11-29 DIAGNOSIS — Z0000 Encounter for general adult medical examination without abnormal findings: Secondary | ICD-10-CM

## 2021-11-29 DIAGNOSIS — G4733 Obstructive sleep apnea (adult) (pediatric): Secondary | ICD-10-CM | POA: Diagnosis not present

## 2021-11-29 DIAGNOSIS — I10 Essential (primary) hypertension: Secondary | ICD-10-CM | POA: Diagnosis not present

## 2021-11-29 DIAGNOSIS — M353 Polymyalgia rheumatica: Secondary | ICD-10-CM

## 2021-11-29 LAB — COMPREHENSIVE METABOLIC PANEL
ALT: 10 U/L (ref 0–35)
AST: 14 U/L (ref 0–37)
Albumin: 3.9 g/dL (ref 3.5–5.2)
Alkaline Phosphatase: 71 U/L (ref 39–117)
BUN: 19 mg/dL (ref 6–23)
CO2: 30 mEq/L (ref 19–32)
Calcium: 9.2 mg/dL (ref 8.4–10.5)
Chloride: 103 mEq/L (ref 96–112)
Creatinine, Ser: 0.81 mg/dL (ref 0.40–1.20)
GFR: 66.75 mL/min (ref >60.00)
Glucose, Bld: 91 mg/dL (ref 70–99)
Potassium: 4.7 mEq/L (ref 3.5–5.1)
Sodium: 140 mEq/L (ref 135–145)
Total Bilirubin: 0.5 mg/dL (ref 0.2–1.2)
Total Protein: 6.7 g/dL (ref 6.0–8.3)

## 2021-11-29 LAB — LIPID PANEL
Cholesterol: 146 mg/dL (ref 0–200)
HDL: 59.7 mg/dL (ref >39.00)
LDL Cholesterol: 68 mg/dL (ref 0–99)
NonHDL: 86.62
Total CHOL/HDL Ratio: 2
Triglycerides: 92 mg/dL (ref 0.0–149.0)
VLDL: 18.4 mg/dL (ref 0.0–40.0)

## 2021-11-29 LAB — CBC
HCT: 36.4 % (ref 36.0–46.0)
Hemoglobin: 12.1 g/dL (ref 12.0–15.0)
MCHC: 33.3 g/dL (ref 30.0–36.0)
MCV: 96.2 fl (ref 78.0–100.0)
Platelets: 204 10*3/uL (ref 150.0–400.0)
RBC: 3.78 Mil/uL — ABNORMAL LOW (ref 3.87–5.11)
RDW: 14.3 % (ref 11.5–15.5)
WBC: 3.7 10*3/uL — ABNORMAL LOW (ref 4.0–10.5)

## 2021-11-29 LAB — VITAMIN B12: Vitamin B-12: 862 pg/mL (ref 211–911)

## 2021-11-29 LAB — SEDIMENTATION RATE: Sed Rate: 14 mm/hr (ref 0–30)

## 2021-11-29 MED ORDER — LORAZEPAM 0.5 MG PO TABS
ORAL_TABLET | ORAL | 0 refills | Status: DC
Start: 2021-11-29 — End: 2021-12-26

## 2021-11-29 NOTE — Assessment & Plan Note (Signed)
Wakes with shoulder/arm aching at times Not during the day mostly Will recheck ESR--restart prednisone if elevated

## 2021-11-29 NOTE — Assessment & Plan Note (Signed)
Didn't tolerate CPAP

## 2021-11-29 NOTE — Assessment & Plan Note (Signed)
I have personally reviewed the Medicare Annual Wellness questionnaire and have noted 1. The patient's medical and social history 2. Their use of alcohol, tobacco or illicit drugs 3. Their current medications and supplements 4. The patient's functional ability including ADL's, fall risks, home safety risks and hearing or visual             impairment. 5. Diet and physical activities 6. Evidence for depression or mood disorders  The patients weight, height, BMI and visual acuity have been recorded in the chart I have made referrals, counseling and provided education to the patient based review of the above and I have provided the pt with a written personalized care plan for preventive services.  I have provided you with a copy of your personalized plan for preventive services. Please take the time to review along with your updated medication list.  Had bivalent COVID and flu vaccines Hopes to get back to more regular exercise No cancer screening due to ago

## 2021-11-29 NOTE — Progress Notes (Signed)
Subjective:    Patient ID: [name], female    DOB: [birth year], 86 y.o.   MRN: [id number]  HPI Here for Medicare wellness visit and follow up of chronic health conditions Reviewed advanced directives Reviewed other doctors---Dr [facility] --cardiology, Dr  Novella Olive, Dr Derald Macleod, Dr Venetia Constable, Dr Jonna Clark, Dr Guinevere Ferrari, Dr Venora Maples No hospitalizations or surgery this year Vision is good Deaf on right---hearing aides didn't help Rare alcohol No tobacco Very active but no set exercise No falls No regular depression--sad at times. Not anhedonic Independent with instrumental ADLs Ongoing memory problems--may be worse with her stress  Not doing so great Caregiving stress has been worsening--not sleeping well Wakes with sore shoulders and neck Has been off prednisone Anxiety level running high Using the lorazepam---tries to be sparing with it Does use tylenol at night  Ongoing evaluation for her husband Seeing neurologist He prefers to stay by himself--so she doesn't think he would do well in the day program Does go to dementia support and speaks to the social worker Cherise regularly  More episodes of atrial fib recently Uses diltiazem for spell that persists (and hasn't needed propranolol) Metoprolol increased No chest pain---just mostly overall aches and pains--especially left costochondral area No dizziness or syncope No ankle swelling  Current Outpatient Medications on File Prior to Visit  Medication Sig Dispense Refill   acetaminophen (TYLENOL) 500 MG tablet Take 1,000 mg by mouth as needed.     Cholecalciferol (VITAMIN D) 2000 units CAPS Take 2,000 Units by mouth daily.     cyanocobalamin (,VITAMIN B-12,) 1000 MCG/ML injection INJECT 1 ML MONTHLY AS DIRECTED 10 mL 1   diltiazem (CARDIZEM) 30 MG tablet Take 1 tablet (30 mg total) by mouth 3 (three) times daily as needed (atrial fibrillation lasting more than 2 hours). 60 tablet 3    Estradiol (IMVEXXY MAINTENANCE PACK) 4 MCG INST Place 1 ampule vaginally 2 (two) times a week. 24 each 3   fluticasone (FLONASE) 50 MCG/ACT nasal spray Place 1 spray into both nostrils daily.     folic acid (FOLVITE) 1 MG tablet TAKE 1 TABLET BY MOUTH EVERY DAY 90 tablet 3   hydrocortisone 2.5 % cream Apply topically 3 (three) times daily as needed. 28 g 3   ipratropium (ATROVENT) 0.03 % nasal spray Place 2 sprays into both nostrils every 12 (twelve) hours.     levocetirizine (XYZAL) 5 MG tablet Take 5 mg by mouth every evening.     LORazepam (ATIVAN) 0.5 MG tablet TAKE 1/2 TO 1 TABLET(0.25 TO 0.5 MG) BY MOUTH TWICE DAILY AS NEEDED FOR ANXIETY 30 tablet 0   metoprolol succinate (TOPROL-XL) 50 MG 24 hr tablet Take 1 tablet (50 mg total) by mouth daily. 90 tablet 3   propranolol (INDERAL) 10 MG tablet Take 1 tablet (10 mg total) by mouth 3 (three) times daily as needed (atrial fibrillation lasting more than 2 hours). 60 tablet 6   rosuvastatin (CRESTOR) 5 MG tablet Take 1 tablet (5 mg total) by mouth daily. 90 tablet 3   Trolamine Salicylate (ASPERCREME EX) Apply topically as needed.     valsartan (DIOVAN) 80 MG tablet TAKE 1 TABLET(80 MG) BY MOUTH DAILY 90 tablet 3   XARELTO 20 MG TABS tablet TAKE 1 TABLET BY MOUTH EVERY DAY WITH THE EVENING MEAL 90 tablet 3   No current facility-administered medications on file prior to visit.    Allergies  Allergen Reactions   Actonel [Risedronate Sodium] Nausea And Vomiting   Boniva [Ibandronic Acid]  Nausea And Vomiting   Diclofenac Other (See Comments)    Voltaren Gel aggravated her sinus passages   Fosamax [Alendronate Sodium] Nausea And Vomiting   Lipitor [Atorvastatin] Other (See Comments)    Muscle aches. Tolerates Crestor.   Miacalcin [Calcitonin ([place])] Other (See Comments)    Sneezing    Myrbetriq [Mirabegron] Other (See Comments)    Gastritis   Talwin [Pentazocine] Other (See Comments)    Hallucinations    Latex Rash    Sneezing, watery  eyes.   Niaspan [Niacin Er] Swelling and Rash    Past Medical History:  Diagnosis Date   Adenomatous colon polyp    Allergic rhinitis due to pollen    Anemia    Anxiety    Bronchiectasis (HCC)    Complication of anesthesia    Degenerative disc disease    cervical and lumbar   Dysrhythmia    GERD (gastroesophageal reflux disease)    Hearing loss in right ear    Heart murmur    HOH (hard of hearing)    AIDS   Hyperlipidemia    Hypertension    Osteoarthritis, multiple sites    PAF (paroxysmal atrial fibrillation) ([place])    a. 01/2013 Echo [facility]): EF 60-65%, no rwma, mildly dil LA/RA, mild AI/TR, trace MR, nl RV size/fxn, mild PAH; b. CHA2DS2VASc = 4-->Chronic Xarelto.   PMR (polymyalgia rheumatica) (HCC)    PONV (postoperative nausea and vomiting)    Rheumatic fever    Sleep apnea    NO CPAP   Urge incontinence    Vertigo    Vertigo     Past Surgical History:  Procedure Laterality Date   ABDOMINAL HYSTERECTOMY     ANKLE FRACTURE SURGERY Left 1996   BACK SURGERY     BREAST BIOPSY Left 2010   negative   CATARACT EXTRACTION W/PHACO Left 10/05/2018   Procedure: CATARACT EXTRACTION PHACO AND INTRAOCULAR LENS PLACEMENT ([place]);  Surgeon: Birder Robson, MD;  Location: [facility] ORS;  Service: Ophthalmology;  Laterality: Left;  [place] 00:40 CDE 6.92 fluid pack lot # [id number]      CATARACT EXTRACTION W/PHACO Right 10/26/2018   Procedure: CATARACT EXTRACTION PHACO AND INTRAOCULAR LENS PLACEMENT ([facility]);  Surgeon: Birder Robson, MD;  Location: [facility] ORS;  Service: Ophthalmology;  Laterality: Right;  [place]  00:47 CDE 7.11 Fluid pack lot # [id number]   DEXA  11/09/06   normal   FRACTURE SURGERY     Meningitis  1963   SPINE SURGERY     SYNOVECTOMY Right 1973   elbow   TONSILLECTOMY AND ADENOIDECTOMY      Family History  Problem Relation Age of Onset   Schizophrenia Mother        paranoid   Heart disease Mother        from psych meds   Arthritis Mother    Rheum  arthritis Mother    Cancer Father        prostate   Arthritis Father    Diabetes Father    Ovarian cancer Maternal Grandmother    Uterine cancer Maternal Grandmother    Colon cancer Maternal Grandmother    Breast cancer Paternal Aunt 80    Social History   Socioeconomic History   Marital status: Married    Spouse name: Not on file   Number of children: 1   Years of education: Not on file   Highest education level: Not on file  Occupational History   Occupation: Scientist, physiological and college professor    Comment:  [employer]--PhD in Special education  Tobacco Use   Smoking status: Never   Smokeless tobacco: Never  Vaping Use   Vaping Use: Never used  Substance and Sexual Activity   Alcohol use: Yes    Comment: OCCAS   Drug use: No   Sexual activity: Not on file  Other Topics Concern   Not on file  Social History Narrative   Retired 2007    1 son in [place] area   Spends part time in [place]      Has living will   Husband then son is health care POA   Not sure about DNR---will keep open resuscitation for now    No tube feedings if cognitively unaware               Social Determinants of Radio broadcast assistant Strain: Not on file  Food Insecurity: Not on file  Transportation Needs: Not on file  Physical Activity: Not on file  Stress: Not on file  Social Connections: Not on file  Intimate Partner Violence: Not on file   Review of Systems Appetite is okay Has lost some weight---trying to avoid stress eating Wears seat belt Teeth okay---recent exam Neuropathy---right ulnar and in feet Only occasional heartburn---tums will help. No dysphagia Bowels are daily or more--some cramping. No blood Rare trouble with continence (bowel and bladder). Uses pad if out sometimes No suspicious skin lesions No striking or persistent back or joint pains now     Objective:   Physical Exam Constitutional:      Appearance: Normal appearance.  HENT:      Mouth/Throat:     Comments: No lesions Eyes:     Conjunctiva/sclera: Conjunctivae normal.     Pupils: Pupils are equal, round, and reactive to light.  Cardiovascular:     Rate and Rhythm: Normal rate and regular rhythm.     Heart sounds: No murmur heard.   No gallop.     Comments: Faint pedal pulses Pulmonary:     Effort: Pulmonary effort is normal.     Breath sounds: Normal breath sounds. No wheezing or rales.  Abdominal:     Palpations: Abdomen is soft.     Tenderness: There is no abdominal tenderness.  Musculoskeletal:     Cervical back: Neck supple.     Comments: Trace ankle edema on left  Lymphadenopathy:     Cervical: No cervical adenopathy.  Skin:    Findings: No lesion or rash.  Neurological:     Mental Status: She is alert and oriented to person, place, and time.     Comments: President---"Joe Julio Sicks, Obama" 100-93- "I can't do that one" H-t-r-a-e Recall 3/3  Psychiatric:        Mood and Affect: Mood normal.        Behavior: Behavior normal.           Assessment & Plan:

## 2021-11-29 NOTE — Assessment & Plan Note (Signed)
Mild symptoms On B12

## 2021-11-29 NOTE — Assessment & Plan Note (Signed)
BP Readings from Last 3 Encounters:  11/29/21 118/80  11/12/21 (!) 160/70  08/22/21 136/70   Fine on the metoprolol and valsartan

## 2021-11-29 NOTE — Progress Notes (Signed)
Hearing Screening - Comments:: Deaf in 1 ear. Sees ENT regularly. Vision Screening - Comments:: July 2022

## 2021-11-29 NOTE — Assessment & Plan Note (Signed)
Anxiety, situational stress---slight dysthmia (reactive) Reassured--regular lorazepam to help sleep is okay

## 2021-11-29 NOTE — Assessment & Plan Note (Signed)
Occasional paroxysms---but now on increased metoprolol Diltiazem prn for flares On xarelto

## 2021-11-29 NOTE — Assessment & Plan Note (Signed)
No apparent progression in the past year

## 2021-12-25 ENCOUNTER — Other Ambulatory Visit: Payer: Self-pay | Admitting: Internal Medicine

## 2021-12-26 NOTE — Telephone Encounter (Signed)
Last filled 11-29-21 #30 Last OV 11-29-21 Next OV 12-03-22 [place]

## 2021-12-30 ENCOUNTER — Other Ambulatory Visit: Payer: Self-pay | Admitting: Cardiovascular Disease

## 2022-01-08 ENCOUNTER — Ambulatory Visit: Payer: Medicare PPO | Admitting: Internal Medicine

## 2022-01-10 ENCOUNTER — Ambulatory Visit
Admission: EM | Admit: 2022-01-10 | Discharge: 2022-01-10 | Disposition: A | Discharge status: Home / Self Care | Payer: Medicare PPO | Attending: Internal Medicine | Admitting: Internal Medicine

## 2022-01-10 ENCOUNTER — Encounter: Payer: Self-pay | Admitting: Emergency Medicine

## 2022-01-10 ENCOUNTER — Other Ambulatory Visit: Payer: Self-pay

## 2022-01-10 DIAGNOSIS — J029 Acute pharyngitis, unspecified: Secondary | ICD-10-CM

## 2022-01-10 NOTE — ED Provider Notes (Signed)
[facility]    CSN: [id number] Arrival date & time: 01/10/22  1222      History   Chief Complaint Chief Complaint  Patient presents with   Cough   Nasal Congestion   Dizziness   Headache    HPI Jennifer Schmitt is a 87 y.o. female comes to urgent care with 1 week history of nasal congestion, nonproductive cough and fatigue.  Patient's symptoms started a week ago and has been persistent.  Yesterday patient started experiencing worsening cough.  Cough is nonproductive with associated chest tightness.  Chest tightness is intermittent and only associated with coughing.  No vomiting or diarrhea.  No dizziness, near syncope or syncopal episodes.  No sick contacts.  Patient is fully vaccinated.  Home COVID test is negative.  HPI  Past Medical History:  Diagnosis Date   Adenomatous colon polyp    Allergic rhinitis due to pollen    Anemia    Anxiety    Bronchiectasis (HCC)    Complication of anesthesia    Degenerative disc disease    cervical and lumbar   Dysrhythmia    GERD (gastroesophageal reflux disease)    Hearing loss in right ear    Heart murmur    HOH (hard of hearing)    AIDS   Hyperlipidemia    Hypertension    Osteoarthritis, multiple sites    PAF (paroxysmal atrial fibrillation) ([place])    a. 01/2013 Echo [facility]): EF 60-65%, no rwma, mildly dil LA/RA, mild AI/TR, trace MR, nl RV size/fxn, mild PAH; b. CHA2DS2VASc = 4-->Chronic Xarelto.   PMR (polymyalgia rheumatica) (HCC)    PONV (postoperative nausea and vomiting)    Rheumatic fever    Sleep apnea    NO CPAP   Urge incontinence    Vertigo    Vertigo     Patient Active Problem List   Diagnosis Date Noted   Change in bowel habits 06/26/2021   MCI (mild cognitive impairment) 11/09/2019   Vaginal atrophy 11/23/2017   Overactive bladder 11/23/2017   Neuropathy 10/15/2016   Schwannoma 06/05/2015   Pernicious anemia 04/11/2015   Mild mood disorder ([place]) 04/11/2015   Visit for preventive  health examination 10/10/2014   Advanced directives, counseling/discussion 10/10/2014   Obstructive sleep apnea 04/20/2014   Hypertension    Hyperlipidemia    GERD (gastroesophageal reflux disease)    Allergic rhinitis due to pollen    Atrial fibrillation (HCC)    PMR (polymyalgia rheumatica) (HCC)    Osteoarthritis, multiple sites    Degenerative disc disease     Past Surgical History:  Procedure Laterality Date   ABDOMINAL HYSTERECTOMY     ANKLE FRACTURE SURGERY Left 1996   BACK SURGERY     BREAST BIOPSY Left 2010   negative   CATARACT EXTRACTION W/PHACO Left 10/05/2018   Procedure: CATARACT EXTRACTION PHACO AND INTRAOCULAR LENS PLACEMENT ([place]);  Surgeon: Birder Robson, MD;  Location: [facility];  Service: Ophthalmology;  Laterality: Left;  [place] 00:40 CDE 6.92 fluid pack lot # [id number]      CATARACT EXTRACTION W/PHACO Right 10/26/2018   Procedure: CATARACT EXTRACTION PHACO AND INTRAOCULAR LENS PLACEMENT ([facility]);  Surgeon: Birder Robson, MD;  Location: [facility] OR[facility];  Service: Ophthalmology;  Laterality: Right;  [place]  00:47 CDE 7.11 Fluid pack lot # [id number]   DEXA  11/09/06   normal   FRACTURE SURGERY     Meningitis  1963   SPINE SURGERY     SYNOVECTOMY Right 1973   elbow  TONSILLECTOMY AND ADENOIDECTOMY      OB History     Gravida  1   Para  1   Term  1   Preterm      AB      Living  1      SAB      IAB      Ectopic      Multiple      Live Births               Home Medications    Prior to Admission medications   Medication Sig Start Date End Date Taking? Authorizing Provider  acetaminophen (TYLENOL) 500 MG tablet Take 1,000 mg by mouth as needed.    [provider]  Cholecalciferol (VITAMIN D) 2000 units CAPS Take 2,000 Units by mouth daily.    [provider]  cyanocobalamin (,VITAMIN B-12,) 1000 MCG/ML injection INJECT 1 ML MONTHLY AS DIRECTED 02/05/21   Venia Carbon, MD  diltiazem (CARDIZEM) 30 MG tablet Take 1  tablet (30 mg total) by mouth 3 (three) times daily as needed (atrial fibrillation lasting more than 2 hours). 11/12/21   Minna Merritts, MD  Estradiol (IMVEXXY MAINTENANCE PACK) 4 MCG INST Place 1 ampule vaginally 2 (two) times a week. 01/21/21   Gae Dry, MD  fluticasone (FLONASE) 50 MCG/ACT nasal spray Place 1 spray into both nostrils daily.    [provider]  folic acid (FOLVITE) 1 MG tablet TAKE 1 TABLET BY MOUTH EVERY DAY 05/14/21   [name] I, MD  hydrocortisone 2.5 % cream Apply topically 3 (three) times daily as needed. 11/05/18   [name] I, MD  ipratropium (ATROVENT) 0.03 % nasal spray Place 2 sprays into both nostrils every 12 (twelve) hours.    [provider]  levocetirizine (XYZAL) 5 MG tablet Take 5 mg by mouth every evening.    [provider]  LORazepam (ATIVAN) 0.5 MG tablet TAKE ONE HALF TO 1 TABLET BY MOUTH TWICEDAILY AS NEEDED FOR ANXIETY 12/26/21   [name] I, MD  metoprolol succinate (TOPROL-XL) 50 MG 24 hr tablet Take 1 tablet (50 mg total) by mouth daily. 11/12/21   Minna Merritts, MD  propranolol (INDERAL) 10 MG tablet Take 1 tablet (10 mg total) by mouth 3 (three) times daily as needed (atrial fibrillation lasting more than 2 hours). 11/12/21   Minna Merritts, MD  rosuvastatin (CRESTOR) 5 MG tablet Take 1 tablet (5 mg total) by mouth daily. 06/26/21   Venia Carbon, MD  Trolamine Salicylate (ASPERCREME EX) Apply topically as needed.    [provider]  valsartan (DIOVAN) 80 MG tablet TAKE 1 TABLET(80 MG) BY MOUTH DAILY 05/07/21   Gollan, Kathlene November, MD  XARELTO 20 MG TABS tablet TAKE 1 TABLET BY MOUTH EVERY DAY WITH THE EVENING MEAL 10/30/21   Minna Merritts, MD    Family History Family History  Problem Relation Age of Onset   Schizophrenia Mother        paranoid   Heart disease Mother        from psych meds   Arthritis Mother    Rheum arthritis Mother    Cancer Father        prostate    Arthritis Father    Diabetes Father    Ovarian cancer Maternal Grandmother    Uterine cancer Maternal Grandmother    Colon cancer Maternal Grandmother    Breast cancer Paternal Aunt 80  Social History Social History   Tobacco Use   Smoking status: Never   Smokeless tobacco: Never  Vaping Use   Vaping Use: Never used  Substance Use Topics   Alcohol use: Yes    Comment: OCCAS   Drug use: No     Allergies   Actonel [risedronate sodium], Boniva [ibandronic acid], Diclofenac, Fosamax [alendronate sodium], Lipitor [atorvastatin], Miacalcin [calcitonin (salmon)], Myrbetriq [mirabegron], Talwin [pentazocine], Latex, and Niaspan [niacin er]   Review of Systems Review of Systems  Constitutional: Negative.   HENT:  Positive for congestion and sore throat. Negative for sinus pressure, sinus pain, trouble swallowing and voice change.   Respiratory:  Positive for cough and chest tightness. Negative for wheezing.   Cardiovascular:  Negative for chest pain.  Gastrointestinal: Negative.   Musculoskeletal: Negative.     Physical Exam Triage Vital Signs ED Triage Vitals  Enc Vitals Group     BP 01/10/22 1251 (!) 172/78     Pulse Rate 01/10/22 1247 63     Resp 01/10/22 1247 18     Temp 01/10/22 1247 98.8 F (37.1 C)     Temp Source 01/10/22 1247 Oral     SpO2 01/10/22 1247 98 %     Weight --      Height --      Head Circumference --      Peak Flow --      Pain Score 01/10/22 1252 0     Pain Loc --      Pain Edu? --      Excl. in [place]? --    No data found.  Updated Vital Signs BP (!) 172/78    Pulse 63    Temp 98.8 F (37.1 C) (Oral)    Resp 18    SpO2 98%   Visual Acuity Right Eye Distance:   Left Eye Distance:   Bilateral Distance:    Right Eye Near:   Left Eye Near:    Bilateral Near:     Physical Exam Vitals and nursing note reviewed.  Constitutional:      General: She is not in acute distress.    Appearance: She is not ill-appearing.  Cardiovascular:      Rate and Rhythm: Normal rate.     Heart sounds: Normal heart sounds.  Pulmonary:     Effort: Pulmonary effort is normal.     Breath sounds: Normal breath sounds.  Abdominal:     General: Bowel sounds are normal.     Palpations: Abdomen is soft.  Neurological:     Mental Status: She is alert.     GCS: GCS eye subscore is 4. GCS verbal subscore is 5. GCS motor subscore is 6.     UC Treatments / Results  Labs (all labs ordered are listed, but only abnormal results are displayed) Labs Reviewed  COVID-19, FLU A+B AND RSV    EKG   Radiology No results found.  Procedures Procedures (including critical care time)  Medications Ordered in UC Medications - No data to display  Initial Impression / Assessment and Plan / UC Course  I have reviewed the triage vital signs and the nursing notes.  Pertinent labs & imaging results that were available during my care of the patient were reviewed by me and considered in my medical decision making (see chart for details).     1.  Acute viral pharyngitis: Warm salt water gargle COVID, flu, RSV PCR test has been sent Increase oral fluid intake We will  call you with recommendations if labs are abnormal Over-the-counter cough medication-patient's preference Return to urgent care if symptoms worsen. Final Clinical Impressions(s) / UC Diagnoses   Final diagnoses:  Acute viral pharyngitis     Discharge Instructions      Warm salt water gargle Over-the-counter cough medicine Maintain adequate hydration We will call you with recommendations if labs are abnormal.   ED Prescriptions   None    PDMP not reviewed this encounter.   Chase Picket, MD 01/10/22 1315

## 2022-01-10 NOTE — Discharge Instructions (Addendum)
Warm salt water gargle Over-the-counter cough medicine Maintain adequate hydration We will call you with recommendations if labs are abnormal.

## 2022-01-10 NOTE — ED Triage Notes (Signed)
Pt here with fatigue, congestion, cough, dizziness and headache x 1 week. Tested negative for COVID.

## 2022-01-12 LAB — COVID-19, FLU A+B AND RSV
Influenza A, NAA: NOT DETECTED
Influenza B, NAA: NOT DETECTED
RSV, NAA: NOT DETECTED
SARS-CoV-2, NAA: NOT DETECTED

## 2022-01-21 ENCOUNTER — Telehealth: Payer: Self-pay

## 2022-01-21 NOTE — Telephone Encounter (Signed)
[place] [facility] - Client TELEPHONE ADVICE RECORD [facility] Patient Name: Jennifer Schmitt Gender: Female DOB: Nov 18, 1935 Age: 87 Y 56 M 15 D Return Phone Number: [phone] (Primary) Address: City/ State/ Zip: [place]  [address] Client Jennifer Schmitt - Client Client Site [facility] Provider Jennifer Schmitt- MD Contact Type Call Who Is Calling Patient / Member / Family / Caregiver Call Type Triage / Clinical Relationship To Patient Self Return Phone Number [phone] (Primary) Chief Complaint Headache Reason for Call Symptomatic / Request for [place] says that she has pain on her neck and headache Translation No Nurse Assessment Nurse: Kirk Ruths, RN, [facility] Date/Time (Eastern Time): 01/21/2022 2:05:41 PM Confirm and document reason for call. If symptomatic, describe symptoms. ---Caller states she has had an URI x 2 weeks no fever she has a headache and a stiff neck making it hard for her to turn her neck hx of meningitis. wh wants to know if she needs to be seen has been negative for covid flu and RSV no sore throat and has some ear discomfort and pressure no trouble breathing no nasal congestion no chest pain no vision changes Does the patient have any new or worsening symptoms? ---Yes Will a triage be completed? ---Yes Related visit to physician within the last 2 weeks? ---No Does the PT have any chronic conditions? (i.e. diabetes, asthma, this includes High risk factors for pregnancy, etc.) ---Yes List chronic conditions. ---afib, arthritis , Is this a behavioral health or substance abuse call? ---No Guidelines Guideline Title Affirmed Question Affirmed Notes Nurse Date/Time (Eastern Time) Headache [1] SEVERE headache (e.g., excruciating) AND [2] not improved after 2 hours of pain medicine Kirk Ruths, RN, [facility] 01/21/2022 2:08:54 PM PLEASE NOTE:  All timestamps contained within this report are represented as [place] Standard Time. CONFIDENTIALTY NOTICE: This fax transmission is intended only for the addressee. It contains information that is legally privileged, confidential or otherwise protected from use or disclosure. If you are not the intended recipient, you are strictly prohibited from reviewing, disclosing, copying using or disseminating any of this information or taking any action in reliance on or regarding this information. If you have received this fax in error, please notify us immediately by telephone so that we can arrange for its return to [place]. Phone: [phone], Toll-Free: [phone], Fax: [phone] Page: 2 of 2 Call Id: [id number] [place]. Time Eilene Ghazi Time) Disposition Final User 01/21/2022 2:11:50 PM See HCP within 4 Hours (or PCP triage) Yes Kirk Ruths, RN, [place] Disagree/Comply Comply Caller Understands Yes PreDisposition Call Doctor Care Advice Given Per Guideline SEE HCP (OR PCP TRIAGE) WITHIN 4 HOURS: * UCC: Some UCCs can manage patients who are stable and have less serious symptoms (e.g., minor illnesses and injuries). The triager must know the [facility] capabilities before sending a patient there. If unsure, call ahead. CARE ADVICE given per Headache (Adult) guideline. * You become worse CALL BACK IF: * It is better and safer if another adult drives instead of you. ANOTHER ADULT SHOULD DRIVE: PAIN MEDICINES - EXTRA NOTES AND WARNINGS: * Acetaminophen is thought to be safer than ibuprofen or naproxen in people over 80 years old. Acetaminophen is in many OTC and prescription medicines. It might be in more than one medicine that you are taking. You need to be careful and not take an overdose. An acetaminophen overdose can hurt the liver. PAIN MEDICINES: * For pain relief, you can take either acetaminophen,  ibuprofen, or naproxen. * They are over-the-counter (OTC) pain drugs. You can buy them at the drugstore.  * ACETAMINOPHEN - REGULAR STRENGTH TYLENOL: Take 650 mg (two 325 mg pills) by mouth every 4 to 6 hours as needed. Each Regular Strength Tylenol pill has 325 mg of acetaminophen. The most you should take is 10 pills a Schmitt (3,250 mg total). Note: In [place], the maximum is 12 pills a Schmitt (3,900 mg total). * IBUPROFEN (E.G., MOTRIN, ADVIL): Take 400 mg (two 200 mg pills) by mouth every 6 hours. The most you should take is 6 pills a Schmitt (1,200 mg total). Referrals GO TO FACILITY UNDECIDE

## 2022-01-21 NOTE — Telephone Encounter (Signed)
I spoke with pt; pt said had 2 neg covid test in the last wk or so. Pt said 01/10/22 was seen at [facility] and pt got results back this wk, neg covid test, RSV and neg flu. Pt said she is feeling better now than earlier today. Pt said now has dull H/A at pain level of 4. Pts BP is better, BP at home is 140 something/?. Pt said she has been feeling anxious today and pt has taken tylenol for H/A and Lorazepam for anxiety; pt said she is less anxious now. Pt said she has been resting and feeling much better. UC & ED precautions given and pt voiced understanding. Pt already has appt scheduled with Dr Silvio Pate on 01/22/22 at 9 AM. Sending note to Dr Silvio Pate as PCP and who is out of office this afternoon and Dr Damita Dunnings who is in office and [facility] CMA.

## 2022-01-21 NOTE — Telephone Encounter (Deleted)
Sending note to Dr Silvio Pate who is PCP and out of office.

## 2022-01-22 ENCOUNTER — Other Ambulatory Visit: Payer: Self-pay

## 2022-01-22 ENCOUNTER — Encounter: Payer: Self-pay | Admitting: Internal Medicine

## 2022-01-22 ENCOUNTER — Ambulatory Visit: Payer: Medicare PPO | Admitting: Internal Medicine

## 2022-01-22 ENCOUNTER — Ambulatory Visit: Payer: Medicare PPO | Admitting: Obstetrics & Gynecology

## 2022-01-22 DIAGNOSIS — J0100 Acute maxillary sinusitis, unspecified: Secondary | ICD-10-CM | POA: Diagnosis not present

## 2022-01-22 MED ORDER — DOXYCYCLINE HYCLATE 100 MG PO TABS: 100.0000 mg | ORAL_TABLET | Freq: Two times a day (BID) | ORAL | 0 refills | Status: DC

## 2022-01-22 NOTE — Telephone Encounter (Signed)
Okay----I will check her this morning 

## 2022-01-22 NOTE — Progress Notes (Signed)
Subjective:    Patient ID: Jennifer Schmitt, female    DOB: [birth year], 87 y.o.   MRN: [id number]  HPI Here due to persistent respiratory infection  Started on 1/19---started with coughing spell Seen the next day---COVID, RSV, flu all negative Still not better  Has headache, some cough Dripping nose Has aching in neck "I feel miserable"  Using nasal sprays (fluticasone/ipratropium) Using xyzal   Has headache on top of head No tooth pain Right ear feels full, left ear full at times Cough is dry Some SOB---mostly related to wearing N95 mask No fever  Current Outpatient Medications on File Prior to Visit  Medication Sig Dispense Refill   acetaminophen (TYLENOL) 500 MG tablet Take 1,000 mg by mouth as needed.     Cholecalciferol (VITAMIN D) 2000 units CAPS Take 2,000 Units by mouth daily.     cyanocobalamin (,VITAMIN B-12,) 1000 MCG/ML injection INJECT 1 ML MONTHLY AS DIRECTED 10 mL 1   diltiazem (CARDIZEM) 30 MG tablet Take 1 tablet (30 mg total) by mouth 3 (three) times daily as needed (atrial fibrillation lasting more than 2 hours). 60 tablet 3   Estradiol (IMVEXXY MAINTENANCE PACK) 4 MCG INST Place 1 ampule vaginally 2 (two) times a week. 24 each 3   folic acid (FOLVITE) 1 MG tablet TAKE 1 TABLET BY MOUTH EVERY DAY 90 tablet 3   hydrocortisone 2.5 % cream Apply topically 3 (three) times daily as needed. 28 g 3   levocetirizine (XYZAL) 5 MG tablet Take 5 mg by mouth every evening.     LORazepam (ATIVAN) 0.5 MG tablet TAKE ONE HALF TO 1 TABLET BY MOUTH TWICEDAILY AS NEEDED FOR ANXIETY 30 tablet 0   metoprolol succinate (TOPROL-XL) 50 MG 24 hr tablet Take 1 tablet (50 mg total) by mouth daily. 90 tablet 3   propranolol (INDERAL) 10 MG tablet Take 1 tablet (10 mg total) by mouth 3 (three) times daily as needed (atrial fibrillation lasting more than 2 hours). 60 tablet 6   rosuvastatin (CRESTOR) 5 MG tablet Take 1 tablet (5 mg total) by mouth daily. 90 tablet 3   Trolamine  Salicylate (ASPERCREME EX) Apply topically as needed.     valsartan (DIOVAN) 80 MG tablet TAKE 1 TABLET(80 MG) BY MOUTH DAILY 90 tablet 3   XARELTO 20 MG TABS tablet TAKE 1 TABLET BY MOUTH EVERY DAY WITH THE EVENING MEAL 90 tablet 3   fluticasone (FLONASE) 50 MCG/ACT nasal spray Place 1 spray into both nostrils daily. (Patient not taking: Reported on 01/22/2022)     ipratropium (ATROVENT) 0.03 % nasal spray Place 2 sprays into both nostrils every 12 (twelve) hours. (Patient not taking: Reported on 01/22/2022)     No current facility-administered medications on file prior to visit.    Allergies  Allergen Reactions   Actonel [Risedronate Sodium] Nausea And Vomiting   Boniva [Ibandronic Acid] Nausea And Vomiting   Diclofenac Other (See Comments)    Voltaren Gel aggravated her sinus passages   Fosamax [Alendronate Sodium] Nausea And Vomiting   Lipitor [Atorvastatin] Other (See Comments)    Muscle aches. Tolerates Crestor.   Miacalcin [Calcitonin ([place])] Other (See Comments)    Sneezing    Myrbetriq [Mirabegron] Other (See Comments)    Gastritis   Talwin [Pentazocine] Other (See Comments)    Hallucinations    Latex Rash    Sneezing, watery eyes.   Niaspan [Niacin Er] Swelling and Rash    Past Medical History:  Diagnosis Date   Adenomatous colon  polyp    Allergic rhinitis due to pollen    Anemia    Anxiety    Bronchiectasis (HCC)    Complication of anesthesia    Degenerative disc disease    cervical and lumbar   Dysrhythmia    GERD (gastroesophageal reflux disease)    Hearing loss in right ear    Heart murmur    HOH (hard of hearing)    AIDS   Hyperlipidemia    Hypertension    Osteoarthritis, multiple sites    PAF (paroxysmal atrial fibrillation) ([place])    a. 01/2013 [facility]): EF 60-65%, no rwma, mildly dil LA/RA, mild AI/TR, trace MR, nl RV size/fxn, mild PAH; b. CHA2DS2VASc = 4-->Chronic Xarelto.   PMR (polymyalgia rheumatica) (HCC)    PONV (postoperative  nausea and vomiting)    Rheumatic fever    Sleep apnea    NO CPAP   Urge incontinence    Vertigo    Vertigo     Past Surgical History:  Procedure Laterality Date   ABDOMINAL HYSTERECTOMY     ANKLE FRACTURE SURGERY Left 1996   BACK SURGERY     BREAST BIOPSY Left 2010   negative   CATARACT EXTRACTION W/PHACO Left 10/05/2018   Procedure: CATARACT EXTRACTION PHACO AND INTRAOCULAR LENS PLACEMENT ([place]);  Surgeon: Birder Robson, MD;  Location: [facility] ORS;  Service: Ophthalmology;  Laterality: Left;  [place] 00:40 CDE 6.92 fluid pack lot # [id number]      CATARACT EXTRACTION W/PHACO Right 10/26/2018   Procedure: CATARACT EXTRACTION PHACO AND INTRAOCULAR LENS PLACEMENT ([facility]);  Surgeon: Birder Robson, MD;  Location: [facility] ORS;  Service: Ophthalmology;  Laterality: Right;  [place]  00:47 CDE 7.11 Fluid pack lot # [id number]   DEXA  11/09/06   normal   FRACTURE SURGERY     Meningitis  1963   SPINE SURGERY     SYNOVECTOMY Right 1973   elbow   TONSILLECTOMY AND ADENOIDECTOMY      Family History  Problem Relation Age of Onset   Schizophrenia Mother        paranoid   Heart disease Mother        from psych meds   Arthritis Mother    Rheum arthritis Mother    Cancer Father        prostate   Arthritis Father    Diabetes Father    Ovarian cancer Maternal Grandmother    Uterine cancer Maternal Grandmother    Colon cancer Maternal Grandmother    Breast cancer Paternal Aunt 80    Social History   Socioeconomic History   Marital status: Married    Spouse name: Not on file   Number of children: 1   Years of education: Not on file   Highest education level: Not on file  Occupational History   Occupation: Scientist, physiological and college professor    Comment: [employer]--PhD in Special education  Tobacco Use   Smoking status: Never   Smokeless tobacco: Never  Vaping Use   Vaping Use: Never used  Substance and Sexual Activity   Alcohol use: Yes    Comment: OCCAS   Drug use: No    Sexual activity: Not on file  Other Topics Concern   Not on file  Social History Narrative   Retired 2007    1 son in [place] area   Spends part time in [place]      Has living will   Husband then son is health care POA   Not sure  about DNR---will keep open resuscitation for now    No tube feedings if cognitively unaware               Social Determinants of Health   Financial Resource Strain: Not on file  Food Insecurity: Not on file  Transportation Needs: Not on file  Physical Activity: Not on file  Stress: Not on file  Social Connections: Not on file  Intimate Partner Violence: Not on file   Review of Systems Sore spot in left lower ribs No N/V Appetite is still okay No diarhea    Objective:   Physical Exam Constitutional:      Appearance: She is not ill-appearing.  HENT:     Head:     Comments: Mild bilateral maxillary tenderness    Right Ear: Tympanic membrane and ear canal normal.     Left Ear: Tympanic membrane and ear canal normal.     Nose:     Comments: Moderate congestion but not overly inflamed    Mouth/Throat:     Pharynx: No oropharyngeal exudate.     Comments: Very slight erythema Neck:     Comments: Mild tenderness left occiput Small non tender anterior cervical nodes--bilat Pulmonary:     Effort: Pulmonary effort is normal.     Breath sounds: Normal breath sounds. No wheezing or rales.  Neurological:     Mental Status: She is alert.           Assessment & Plan:

## 2022-01-22 NOTE — Telephone Encounter (Signed)
Will defer to PCP

## 2022-01-22 NOTE — Assessment & Plan Note (Signed)
Has been sick for 2 weeks Will go ahead with doxycycline 100mg  bid for 7 days Analgesics prn

## 2022-01-23 ENCOUNTER — Ambulatory Visit: Payer: Medicare PPO | Admitting: Obstetrics & Gynecology

## 2022-01-28 ENCOUNTER — Telehealth: Payer: Self-pay | Admitting: Internal Medicine

## 2022-01-28 MED ORDER — AMOXICILLIN-POT CLAVULANATE 875-125 MG PO TABS: 1.0000 | ORAL_TABLET | Freq: Two times a day (BID) | ORAL | 0 refills | Status: DC

## 2022-01-28 NOTE — Telephone Encounter (Signed)
Spoke to pt. Stopped the Doxy and sent in rx for Augmentin

## 2022-01-28 NOTE — Telephone Encounter (Signed)
Pt called stating that she had a OV on 01/22/22. Pt states that  medication doxycycline (VIBRA-TABS) 100 MG tablet is not helping and its also making her nausea. Pt would like to know where do she go from here. Please advise.

## 2022-02-04 ENCOUNTER — Ambulatory Visit
Admission: RE | Admit: 2022-02-04 | Discharge: 2022-02-04 | Disposition: A | Discharge status: Home / Self Care | Payer: Medicare PPO | Source: Ambulatory Visit | Attending: Unknown Physician Specialty | Admitting: Unknown Physician Specialty

## 2022-02-04 ENCOUNTER — Other Ambulatory Visit: Payer: Self-pay | Admitting: Unknown Physician Specialty

## 2022-02-04 DIAGNOSIS — J019 Acute sinusitis, unspecified: Secondary | ICD-10-CM

## 2022-02-04 DIAGNOSIS — R519 Headache, unspecified: Secondary | ICD-10-CM | POA: Diagnosis not present

## 2022-02-07 ENCOUNTER — Other Ambulatory Visit: Payer: Self-pay | Admitting: Unknown Physician Specialty

## 2022-02-07 DIAGNOSIS — R519 Headache, unspecified: Secondary | ICD-10-CM

## 2022-02-11 ENCOUNTER — Telehealth: Payer: Self-pay | Admitting: Cardiovascular Disease

## 2022-02-11 ENCOUNTER — Telehealth: Payer: Self-pay | Admitting: Internal Medicine

## 2022-02-11 MED ORDER — RIVAROXABAN 20 MG PO TABS: ORAL_TABLET | ORAL | 1 refills | Status: DC

## 2022-02-11 MED ORDER — FOLIC ACID 1 MG PO TABS: 1.0000 mg | ORAL_TABLET | Freq: Every day | ORAL | 3 refills | Status: DC

## 2022-02-11 NOTE — Addendum Note (Signed)
Addended by: Pilar Grammes on: 02/11/2022 03:17 PM   Modules accepted: Orders

## 2022-02-11 NOTE — Telephone Encounter (Signed)
[place] needs a new script of the folic acid  Encourage patient to contact the pharmacy for refills or they can request refills through [place]:  Please schedule appointment if longer than 1 year  NEXT APPOINTMENT DATE:  MEDICATION:folic acid (FOLVITE) 1 MG tablet  Is the patient out of medication? yes  PHARMACY: [facility]   Let patient know to contact pharmacy at the end of the day to make sure medication is ready.  Please notify patient to allow 48-72 hours to process  CLINICAL FILLS OUT ALL BELOW:   LAST REFILL:  QTY:  REFILL DATE:    OTHER COMMENTS:    Okay for refill?  Please advise

## 2022-02-11 NOTE — Telephone Encounter (Signed)
Prescription refill request for Xarelto received.  Indication: PAF Last office visit: 11/12/21  Johnny Bridge MD Weight: 86.6kg Age: 87 Scr: 0.81 on 11/29/21 CrCl: 70.68  Based on above findings Xarelto 20mg  daily is the appropriate dose.  Refill approved.

## 2022-02-11 NOTE — Telephone Encounter (Signed)
Refill request

## 2022-02-11 NOTE — Telephone Encounter (Signed)
°*  STAT* If patient is at the pharmacy, call can be transferred to refill team.   1. Which medications need to be refilled? (please list name of each medication and dose if known) Xarelton 20 mg  1 tablet daily  2. Which pharmacy/location (including street and city if local pharmacy) is medication to be sent to? [place]  3. Do they need a 30 day or 90 day supply? 90 day supply

## 2022-02-11 NOTE — Telephone Encounter (Signed)
Rx sent electronically.  

## 2022-02-12 ENCOUNTER — Telehealth: Payer: Self-pay

## 2022-02-12 ENCOUNTER — Other Ambulatory Visit: Payer: Self-pay | Admitting: Internal Medicine

## 2022-02-12 NOTE — Telephone Encounter (Signed)
PCP responded shortly after message was sent.

## 2022-02-12 NOTE — Telephone Encounter (Signed)
[place] Day - Client TELEPHONE ADVICE RECORD Access[facility]urse Patient Name: Jennifer Schmitt Gender: Female DOB: 1935-03-08 Age: 87 Y 60 M 6 D Return Phone Number: [phone] (Primary), [phone] (Secondary) Address: City/ State/ Zip: [place]  [address] Client [facility] - Client Client Site [place] - Day Provider [name]- MD Contact Type Call Who Is Calling Patient / Member / Family / Caregiver Call Type Triage / Clinical Relationship To Patient Self Return Phone Number [phone] (Primary) Chief Complaint Abdominal Pain Reason for Call Symptomatic / Request for [place] is having stomach pain and urinary issues, pain under her rib cage, nausea. Needs to schedule an appt. Translation No Nurse Assessment Nurse: Zorita Pang, RN, Deborah Date/Time (Eastern Time): 02/12/2022 11:58:08 AM Confirm and document reason for call. If symptomatic, describe symptoms. ---The caller states that she is having Schmitt upper quadrant pain and nausea. No vomiting. Nagging pain. Waiting to get CT or MRI for ENT type pain. Does the patient have any new or worsening symptoms? ---Yes Will a triage be completed? ---Yes Related visit to physician within the last 2 weeks? ---No Does the PT have any chronic conditions? (i.e. diabetes, asthma, this includes High risk factors for pregnancy, etc.) ---Yes List chronic conditions. ---A fib and blood thinner, hypertension and hypercholesteremia Is this a behavioral health or substance abuse call? ---No Guidelines Guideline Title Affirmed Question Affirmed Notes Nurse Date/Time Eilene Ghazi Time) Abdominal Pain - Upper [1] Pain lasts > 10 minutes AND [2] age > 5 Zorita Pang, RN, Neoma Laming 02/12/2022 12:00:12 PM Disp. Time Eilene Ghazi Time) Disposition Final User 02/12/2022 12:10:41 PM Send To RN Personal Zorita Pang, RN, Neoma Laming 02/12/2022  12:09:27 PM Go to ED Now Yes Zorita Pang, RN, Rochel Brome NOTE: All timestamps contained within this report are represented as [place] Standard Time. CONFIDENTIALTY NOTICE: This fax transmission is intended only for the addressee. It contains information that is legally privileged, confidential or otherwise protected from use or disclosure. If you are not the intended recipient, you are strictly prohibited from reviewing, disclosing, copying using or disseminating any of this information or taking any action in reliance on or regarding this information. If you have received this fax in error, please notify us immediately by telephone so that we can arrange for its return to [place]. Phone: [phone], Toll-Free: [phone], Fax: [phone] Page: 2 of 2 Call Id: [id number] [place] Disagree/Comply Disagree Caller Understands Yes PreDisposition Go to ED Care Advice Given Per Guideline GO TO ED NOW: Comments User: Marquis Buggy, RN Date/Time Eilene Ghazi Time): 02/12/2022 12:08:48 PM The caller refuses to go to the ED. This nurse explained that due to her age and symptoms she needs to be seen in the ED as this pain may be due to her heart and there is an urgency. She states that she will not go to the ED and that her BP is normal. She states that she was hoping that she could get a message through to Dr. Silvio Pate that the ENT MD was her to have a CT or MRI and she doesn't think that she can lie still due to her RUQ pain and nausea. This nurse offered to call the office and see if we could get a message through to Dr. Silvio Pate but she declined that. This nurse then called the back line and there was no answer. This nurse then called the main number and the hold time was greater than 4 minutes. User: Marquis Buggy, RN  Date/Time (Eastern Time): 02/12/2022 12:36:05 PM Called the back line again and no answer after letting it ring for a minute. Then called the main line. A staff member took the information  about the caller and her refusal to go to the ED. Referrals GO TO FACILITY REFUSE

## 2022-02-12 NOTE — Telephone Encounter (Signed)
I spoke with pt; pt said that she has some pain under rib cage and has had gallbladder issues for years. Pt has some nausea on and off. Pt has noticed some frequency and urgency of urine with slight burning when urinates. Pt does not have lower abd pain and no new back pain. Pt does not have CP,SOB or dizziness. Today BP 132/60 something and no afib. Offered pt an appt at [facility] but pt said she thinks she is going to wait on scheduling an appt because pt said some of her symptoms could be related to anxiety and pt has taken her anxiety med and feels better right now. Pt said she is also waiting to hear about ENT MRI scheduling. Pt said she will wait and see how she does and will cb if needed. UC & ED precautions given and pt voiced understanding. Pt also said she is at [facility] and if needed she will have a nurse access her and pt is also aware there are EMTS on duty at [facility] around the clock. Sending note to Dr Silvio Pate who is out of office this afternoon and Allie Bossier NP who is in office this afternoon and [facility] CMA.

## 2022-02-13 NOTE — Telephone Encounter (Signed)
Spoke to pt. She said she has discomfort. Waiting for imaging appt. Does not think she needs an appt right now.

## 2022-02-13 NOTE — Telephone Encounter (Signed)
Last filled 12-26-21 #30 Last OV 01-22-22 Next OV 12-03-22 [place]

## 2022-03-04 ENCOUNTER — Encounter: Payer: Self-pay | Admitting: Internal Medicine

## 2022-03-04 ENCOUNTER — Other Ambulatory Visit: Payer: Self-pay

## 2022-03-04 ENCOUNTER — Ambulatory Visit: Payer: Medicare PPO | Admitting: Internal Medicine

## 2022-03-04 VITALS — BP 122/78 | HR 52 | Temp 98.0°F | Ht 65.75 in | Wt 184.0 lb

## 2022-03-04 DIAGNOSIS — I10 Essential (primary) hypertension: Secondary | ICD-10-CM

## 2022-03-04 DIAGNOSIS — R5383 Other fatigue: Secondary | ICD-10-CM | POA: Insufficient documentation

## 2022-03-04 DIAGNOSIS — I4891 Unspecified atrial fibrillation: Secondary | ICD-10-CM

## 2022-03-04 DIAGNOSIS — N3281 Overactive bladder: Secondary | ICD-10-CM | POA: Diagnosis not present

## 2022-03-04 DIAGNOSIS — N952 Postmenopausal atrophic vaginitis: Secondary | ICD-10-CM | POA: Diagnosis not present

## 2022-03-04 DIAGNOSIS — R350 Frequency of micturition: Secondary | ICD-10-CM | POA: Insufficient documentation

## 2022-03-04 LAB — POC URINALSYSI DIPSTICK (AUTOMATED)
Bilirubin, UA: NEGATIVE
Glucose, UA: NEGATIVE
Ketones, UA: NEGATIVE
Leukocytes, UA: NEGATIVE
Nitrite, UA: NEGATIVE
Protein, UA: NEGATIVE
Spec Grav, UA: 1.015 (ref 1.010–1.025)
Urobilinogen, UA: 0.2 E.U./dL
pH, UA: 6.5 (ref 5.0–8.0)

## 2022-03-04 LAB — CBC
HCT: 37.3 % (ref 36.0–46.0)
Hemoglobin: 12.6 g/dL (ref 12.0–15.0)
MCHC: 33.8 g/dL (ref 30.0–36.0)
MCV: 97.3 fl (ref 78.0–100.0)
Platelets: 225 10*3/uL (ref 150.0–400.0)
RBC: 3.84 Mil/uL — ABNORMAL LOW (ref 3.87–5.11)
RDW: 14.4 % (ref 11.5–15.5)
WBC: 5.7 10*3/uL (ref 4.0–10.5)

## 2022-03-04 LAB — COMPREHENSIVE METABOLIC PANEL
ALT: 47 U/L — ABNORMAL HIGH (ref 0–35)
AST: 34 U/L (ref 0–37)
Albumin: 4.1 g/dL (ref 3.5–5.2)
Alkaline Phosphatase: 65 U/L (ref 39–117)
BUN: 15 mg/dL (ref 6–23)
CO2: 32 mEq/L (ref 19–32)
Calcium: 9.4 mg/dL (ref 8.4–10.5)
Chloride: 101 mEq/L (ref 96–112)
Creatinine, Ser: 0.71 mg/dL (ref 0.40–1.20)
GFR: 78.04 mL/min (ref >60.00)
Glucose, Bld: 94 mg/dL (ref 70–99)
Potassium: 4.5 mEq/L (ref 3.5–5.1)
Sodium: 139 mEq/L (ref 135–145)
Total Bilirubin: 0.5 mg/dL (ref 0.2–1.2)
Total Protein: 6.7 g/dL (ref 6.0–8.3)

## 2022-03-04 LAB — SEDIMENTATION RATE: Sed Rate: 30 mm/hr (ref 0–30)

## 2022-03-04 LAB — T4, FREE: Free T4: 0.7 ng/dL (ref 0.60–1.60)

## 2022-03-04 MED ORDER — METOPROLOL SUCCINATE ER 50 MG PO TB24: 25.0000 mg | ORAL_TABLET | Freq: Every day | ORAL | 0 refills | Status: DC

## 2022-03-04 MED ORDER — ESTRADIOL 0.1 MG/GM VA CREA: 1.0000 | TOPICAL_CREAM | VAGINAL | 12 refills | Status: DC

## 2022-03-04 NOTE — Assessment & Plan Note (Signed)
Will restart vaginal estradiol twice a week ?

## 2022-03-04 NOTE — Patient Instructions (Signed)
Please cut the metoprolol in half and take '25mg'$  daily. I can send the lower prescription if you stay on this dose. ?Try the vaginal estrogen cream 2-3 times a week ?

## 2022-03-04 NOTE — Assessment & Plan Note (Signed)
BP Readings from Last 3 Encounters:  ?03/04/22 122/78  ?01/22/22 112/74  ?01/10/22 (!) 172/78  ? ?Labile on the diovan 80 and metoprolol 50 ?Will try lowering metoprolol to 25 to see if it helps fatigue ?

## 2022-03-04 NOTE — Assessment & Plan Note (Signed)
Slightly irregular now ?Rate ~72  ?Will try lower metoprolol ?Has diltiazem for prn use ?On the xarelto 20 daily ?

## 2022-03-04 NOTE — Assessment & Plan Note (Addendum)
Likely overactive bladder ?Is off imvexxy for a while---unclear if this could be part of the problem ?Unlikely DM or DI--will check urine ?Hesitant to give myrbetriq given BP lability ? ?Urinalysis shows no sugar and normal concentration (SG 1.015) ?

## 2022-03-04 NOTE — Assessment & Plan Note (Signed)
Could be related to increased metoprolol ?Will also check labs---just in case ?Discussed a trial with going back to '25mg'$  of the metoprolol daily ? ?

## 2022-03-04 NOTE — Addendum Note (Signed)
Addended by: Pilar Grammes on: 03/04/2022 12:40 PM ? ? Modules accepted: Orders ? ?

## 2022-03-04 NOTE — Assessment & Plan Note (Signed)
Consider Jennifer Schmitt is ongoing issues ?

## 2022-03-04 NOTE — Progress Notes (Signed)
? ?Subjective:  ? ? Patient ID: Jennifer Schmitt, female    DOB: Apr 26, 1935, 87 y.o.   MRN: [id number] ? ?HPI ?Here due to urinary symptoms ?She is peeing as many as 8 times/12 hours and 15/24 hours ?Urine tested at [facility]---no abnormalities ?No dysuria or blood ?Wonders if it is related to medications ?Doesn't drink any caffeine ? ?Has noticed a loss of energy ?BP is very erratic ?Having [facility] check-- both high and low ?Occasional lightheadedness ? ?Did have metoprolol doubled by Dr Rockey Situ at last visit in November ?She still feels heart irregular---but hasn't been checking ? ?Needs walker to get around now ? ?Current Outpatient Medications on File Prior to Visit  ?Medication Sig Dispense Refill  ? acetaminophen (TYLENOL) 500 MG tablet Take 1,000 mg by mouth as needed.    ? Cholecalciferol (VITAMIN D) 2000 units CAPS Take 2,000 Units by mouth daily.    ? cyanocobalamin (,VITAMIN B-12,) 1000 MCG/ML injection INJECT 1 ML MONTHLY AS DIRECTED 10 mL 1  ? diltiazem (CARDIZEM) 30 MG tablet Take 1 tablet (30 mg total) by mouth 3 (three) times daily as needed (atrial fibrillation lasting more than 2 hours). 60 tablet 3  ? fluticasone (FLONASE) 50 MCG/ACT nasal spray Place 1 spray into both nostrils daily.    ? folic acid (FOLVITE) 1 MG tablet Take 1 tablet (1 mg total) by mouth daily. 90 tablet 3  ? hydrocortisone 2.5 % cream Apply topically 3 (three) times daily as needed. 28 g 3  ? levocetirizine (XYZAL) 5 MG tablet Take 5 mg by mouth every evening.    ? LORazepam (ATIVAN) 0.5 MG tablet TAKE ONE HALF TO 1 TABLET BY MOUTH TWICEDAILY AS NEEDED FOR ANXIETY 30 tablet 0  ? metoprolol succinate (TOPROL-XL) 50 MG 24 hr tablet Take 1 tablet (50 mg total) by mouth daily. 90 tablet 3  ? propranolol (INDERAL) 10 MG tablet Take 1 tablet (10 mg total) by mouth 3 (three) times daily as needed (atrial fibrillation lasting more than 2 hours). 60 tablet 6  ? rivaroxaban (XARELTO) 20 MG TABS tablet TAKE 1 TABLET BY MOUTH EVERY  DAY WITH THE EVENING MEAL 90 tablet 1  ? rosuvastatin (CRESTOR) 5 MG tablet Take 1 tablet (5 mg total) by mouth daily. 90 tablet 3  ? Trolamine Salicylate (ASPERCREME EX) Apply topically as needed.    ? valsartan (DIOVAN) 80 MG tablet TAKE 1 TABLET(80 MG) BY MOUTH DAILY 90 tablet 3  ? ?No current facility-administered medications on file prior to visit.  ? ? ?Allergies  ?Allergen Reactions  ? Actonel [Risedronate Sodium] Nausea And Vomiting  ? Boniva [Ibandronic Acid] Nausea And Vomiting  ? Diclofenac Other (See Comments)  ?  Voltaren Gel aggravated her sinus passages  ? Fosamax [Alendronate Sodium] Nausea And Vomiting  ? Lipitor [Atorvastatin] Other (See Comments)  ?  Muscle aches. Tolerates Crestor.  ? Miacalcin [Calcitonin ([place])] Other (See Comments)  ?  Sneezing   ? Myrbetriq [Mirabegron] Other (See Comments)  ?  Gastritis  ? Talwin [Pentazocine] Other (See Comments)  ?  Hallucinations   ? Latex Rash  ?  Sneezing, watery eyes.  ? Niaspan [Niacin Er] Swelling and Rash  ? ? ?Past Medical History:  ?Diagnosis Date  ? Adenomatous colon polyp   ? Allergic rhinitis due to pollen   ? Anemia   ? Anxiety   ? Bronchiectasis ([place])   ? Complication of anesthesia   ? Degenerative disc disease   ? cervical and  lumbar  ? Dysrhythmia   ? GERD (gastroesophageal reflux disease)   ? Hearing loss in right ear   ? Heart murmur   ? HOH (hard of hearing)   ? AIDS  ? Hyperlipidemia   ? Hypertension   ? Osteoarthritis, multiple sites   ? PAF (paroxysmal atrial fibrillation) ([place])   ? a. 01/2013 [facility]): EF 60-65%, no rwma, mildly dil LA/RA, mild AI/TR, trace MR, nl RV size/fxn, mild PAH; b. CHA2DS2VASc = 4-->Chronic Xarelto.  ? PMR (polymyalgia rheumatica) (HCC)   ? PONV (postoperative nausea and vomiting)   ? Rheumatic fever   ? Sleep apnea   ? NO CPAP  ? Urge incontinence   ? Vertigo   ? Vertigo   ? ? ?Past Surgical History:  ?Procedure Laterality Date  ? ABDOMINAL HYSTERECTOMY    ? ANKLE FRACTURE SURGERY Left 1996   ? BACK SURGERY    ? BREAST BIOPSY Left 2010  ? negative  ? CATARACT EXTRACTION W/PHACO Left 10/05/2018  ? Procedure: CATARACT EXTRACTION PHACO AND INTRAOCULAR LENS PLACEMENT (IOC);  Surgeon: Birder Robson, MD;  Location: [facility] ORS;  Service: Ophthalmology;  Laterality: Left;  [place] 00:40 ?CDE 6.92 ?fluid pack lot # [id number] H ? ?   ? CATARACT EXTRACTION W/PHACO Right 10/26/2018  ? Procedure: CATARACT EXTRACTION PHACO AND INTRAOCULAR LENS PLACEMENT (IOC);  Surgeon: Birder Robson, MD;  Location: [facility] ORS;  Service: Ophthalmology;  Laterality: Right;  [place]  00:47 ?CDE 7.11 ?Fluid pack lot # [id number]  ? DEXA  11/09/06  ? normal  ? FRACTURE SURGERY    ? Meningitis  1963  ? SPINE SURGERY    ? SYNOVECTOMY Right 1973  ? elbow  ? TONSILLECTOMY AND ADENOIDECTOMY    ? ? ?Family History  ?Problem Relation Age of Onset  ? Schizophrenia Mother   ?     paranoid  ? Heart disease Mother   ?     from psych meds  ? Arthritis Mother   ? Rheum arthritis Mother   ? Cancer Father   ?     prostate  ? Arthritis Father   ? Diabetes Father   ? Ovarian cancer Maternal Grandmother   ? Uterine cancer Maternal Grandmother   ? Colon cancer Maternal Grandmother   ? Breast cancer Paternal Aunt 43  ? ? ?Social History  ? ?Socioeconomic History  ? Marital status: Married  ?  Spouse name: Not on file  ? Number of children: 1  ? Years of education: Not on file  ? Highest education level: Not on file  ?Occupational History  ? Occupation: Scientist, physiological and college professor  ?  Comment: [employer]--PhD in Special education  ?Tobacco Use  ? Smoking status: Never  ? Smokeless tobacco: Never  ?Vaping Use  ? Vaping Use: Never used  ?Substance and Sexual Activity  ? Alcohol use: Yes  ?  Comment: OCCAS  ? Drug use: No  ? Sexual activity: Not on file  ?Other Topics Concern  ? Not on file  ?Social History Narrative  ? Retired 2007   ? 1 son in [place] area  ? Spends part time in [place]  ?   ? Has living will  ? Husband then son is health care POA  ? Not sure  about DNR---will keep open resuscitation for now   ? No tube feedings if cognitively unaware  ?   ?   ?   ?   ? ?Social Determinants of Health  ? ?Financial Resource Strain:  Not on file  ?Food Insecurity: Not on file  ?Transportation Needs: Not on file  ?Physical Activity: Not on file  ?Stress: Not on file  ?Social Connections: Not on file  ?Intimate Partner Violence: Not on file  ? ?Review of Systems ?Sleeps okay with lorazepam---not great otherwise ?Appetite is not great--but eats ?Some right UQ abdominal pain---trying to cut back on fatty foods (and now is better) ? ?   ?Objective:  ? Physical Exam ?Constitutional:   ?   Appearance: Normal appearance.  ?Cardiovascular:  ?   Rate and Rhythm: Normal rate. Rhythm irregular.  ?   Heart sounds: No murmur heard. ?  No gallop.  ?Pulmonary:  ?   Effort: Pulmonary effort is normal.  ?   Breath sounds: Normal breath sounds. No wheezing or rales.  ?Abdominal:  ?   Palpations: Abdomen is soft. There is no mass.  ?   Tenderness: There is no abdominal tenderness. There is no guarding.  ?Musculoskeletal:  ?   Cervical back: Neck supple.  ?   Right lower leg: No edema.  ?   Left lower leg: No edema.  ?Lymphadenopathy:  ?   Cervical: No cervical adenopathy.  ?Neurological:  ?   Mental Status: She is alert.  ?Psychiatric:     ?   Mood and Affect: Mood normal.     ?   Behavior: Behavior normal.  ?  ? ? ? ? ?   ?Assessment & Plan:  ? ?

## 2022-03-11 ENCOUNTER — Telehealth: Payer: Self-pay | Admitting: Cardiovascular Disease

## 2022-03-11 NOTE — Telephone Encounter (Signed)
?*  STAT* If patient is at the pharmacy, call can be transferred to refill team. ? ? ?1. Which medications need to be refilled? (please list name of each medication and dose if known) metoprolol - pharmacy unsure and would like clarification on correct prescription  ? ?2. Which pharmacy/location (including street and city if local pharmacy) is medication to be sent to? [place]  ? ?3. Do they need a 30 day or 90 day supply? 90 day  ? ?

## 2022-03-11 NOTE — Telephone Encounter (Signed)
FYI-Spoke with patient who states Dr. Silvio Pate cut her her metoprolol dosage in half at her recent office visit with him on 03/04/2022 but she does not want to make any changes to this medication until she discuss it with Dr. Rockey Situ at her upcoming visit on Monday 03/17/21. She also states she just discussed this with [facility] before speaking with me and they will not refill it until they hear back from her. ?

## 2022-03-17 ENCOUNTER — Ambulatory Visit: Payer: Medicare PPO | Admitting: Cardiovascular Disease

## 2022-03-17 ENCOUNTER — Ambulatory Visit (INDEPENDENT_AMBULATORY_CARE_PROVIDER_SITE_OTHER): Payer: Medicare PPO

## 2022-03-17 ENCOUNTER — Other Ambulatory Visit: Payer: Self-pay

## 2022-03-17 ENCOUNTER — Encounter: Payer: Self-pay | Admitting: Cardiovascular Disease

## 2022-03-17 VITALS — BP 130/70 | HR 69 | Ht 65.0 in | Wt 184.4 lb

## 2022-03-17 DIAGNOSIS — R0602 Shortness of breath: Secondary | ICD-10-CM | POA: Diagnosis not present

## 2022-03-17 DIAGNOSIS — M353 Polymyalgia rheumatica: Secondary | ICD-10-CM

## 2022-03-17 DIAGNOSIS — I493 Ventricular premature depolarization: Secondary | ICD-10-CM

## 2022-03-17 DIAGNOSIS — E7800 Pure hypercholesterolemia, unspecified: Secondary | ICD-10-CM | POA: Diagnosis not present

## 2022-03-17 DIAGNOSIS — I4819 Other persistent atrial fibrillation: Secondary | ICD-10-CM

## 2022-03-17 DIAGNOSIS — I10 Essential (primary) hypertension: Secondary | ICD-10-CM

## 2022-03-17 NOTE — Patient Instructions (Addendum)
Consider PT at [facility] ? ? ?Medication Instructions:  ?No changes ? ?If you need a refill on your cardiac medications before your next appointment, please call your pharmacy.  ? ? ?Lab work: ?No new labs needed ? ? ?Testing/Procedures: ?1) Echocardiogram: ?- Your physician has requested that you have an echocardiogram. Echocardiography is a painless test that uses sound waves to create images of your heart. It provides your doctor with information about the size and shape of your heart and how well your heart?s chambers and valves are working. This procedure takes approximately one hour. There are no restrictions for this procedure. There is a possibility that an IV may need to be started during your test to inject an image enhancing agent. This is done to obtain more optimal pictures of your heart. Therefore we ask that you do at least drink some water prior to coming in to hydrate your veins.  ? ? ?2) Heart Monitor:  ?- Your physician has recommended that you wear a Zio XT (heart) monitor.  ? ?Length of Wear: 3 days ?Placement: This will be mailed to your home address within 3-4 business days ? ?This monitor is a medical device that records the heart?s electrical activity. Doctors most often use these monitors to diagnose arrhythmias. Arrhythmias are problems with the speed or rhythm of the heartbeat. The monitor is a small device applied to your chest. You can wear one while you do your normal daily activities. While wearing this monitor if you have any symptoms to push the button and record what you felt. Once you have worn this monitor for the period of time provider prescribed (Usually 14 days), you will return the monitor device in the postage paid box. Once it is returned they will download the data collected and provide [place] with a report which the provider will then review and we will call you with those results. Important tips: ? ?Avoid showering during the first 24 hours of wearing the monitor. ?Avoid  excessive sweating to help maximize wear time. ?Do not submerge the device, no hot tubs, and no swimming pools. ?Keep any lotions or oils away from the patch. ?After 24 hours you may shower with the patch on. Take brief showers with your back facing the shower head.  ?Do not remove patch once it has been placed because that will interrupt data and decrease adhesive wear time. ?Push the button when you have any symptoms and write down what you were feeling. ?Once you have completed wearing your monitor, remove and place into box which has postage paid and place in your outgoing mailbox.  ?If for some reason you have misplaced your box then call our office and we can provide another box and/or mail it off for you. ? ? ? ?Follow-Up: ?At [facility], you and your health needs are our priority.  As part of our continuing mission to provide you with exceptional heart care, we have created designated Provider Care Teams.  These Care Teams include your primary Cardiologist (physician) and Advanced Practice Providers (APPs -  Physician Assistants and Nurse Practitioners) who all work together to provide you with the care you need, when you need it. ? ?You will need a follow up appointment in 3 months, APP ok ? ?Providers on your designated Care Team:   ?Murray Hodgkins, NP ?Christell Faith, PA-C ?Cadence Kathlen Mody, PA-C ? ?COVID-19 Vaccine Information can be found at: [email or web address] For questions related to vaccine distribution or appointments, please email [email or web address] or call [phone].  ? ? ?  Echocardiogram ?An echocardiogram is a test that uses sound waves (ultrasound) to produce images of the heart. ?Images from an echocardiogram can provide important information about: ?Heart size and shape. ?The size and thickness and movement of your heart's walls. ?Heart muscle function and strength. ?Heart valve function or if you have stenosis. Stenosis is when  the heart valves are too narrow. ?If blood is flowing backward through the heart valves (regurgitation). ?A tumor or infectious growth around the heart valves. ?Areas of heart muscle that are not working well because of poor blood flow or injury from a heart attack. ?Aneurysm detection. An aneurysm is a weak or damaged part of an artery wall. The wall bulges out from the normal force of blood pumping through the body. ?Tell a health care provider about: ?Any allergies you have. ?All medicines you are taking, including vitamins, herbs, eye drops, creams, and over-the-counter medicines. ?Any blood disorders you have. ?Any surgeries you have had. ?Any medical conditions you have. ?Whether you are pregnant or may be pregnant. ?What are the risks? ?Generally, this is a safe test. However, problems may occur, including an allergic reaction to dye (contrast) that may be used during the test. ?What happens before the test? ?No specific preparation is needed. You may eat and drink normally. ?What happens during the test? ? ?You will take off your clothes from the waist up and put on a hospital gown. ?Electrodes or electrocardiogram (ECG)patches may be placed on your chest. The electrodes or patches are then connected to a device that monitors your heart rate and rhythm. ?You will lie down on a table for an ultrasound exam. A gel will be applied to your chest to help sound waves pass through your skin. ?A handheld device, called a transducer, will be pressed against your chest and moved over your heart. The transducer produces sound waves that travel to your heart and bounce back (or "echo" back) to the transducer. These sound waves will be captured in real-time and changed into images of your heart that can be viewed on a video monitor. The images will be recorded on a computer and reviewed by your health care provider. ?You may be asked to change positions or hold your breath for a short time. This makes it easier to get  different views or better views of your heart. ?In some cases, you may receive contrast through an IV in one of your veins. This can improve the quality of the pictures from your heart. ?The procedure may vary among health care providers and hospitals. ?What can I expect after the test? ?You may return to your normal, everyday life, including diet, activities, and medicines, unless your health care provider tells you not to do that. ?Follow these instructions at home: ?It is up to you to get the results of your test. Ask your health care provider, or the department that is doing the test, when your results will be ready. ?Keep all follow-up visits. This is important. ?Summary ?An echocardiogram is a test that uses sound waves (ultrasound) to produce images of the heart. ?Images from an echocardiogram can provide important information about the size and shape of your heart, heart muscle function, heart valve function, and other possible heart problems. ?You do not need to do anything to prepare before this test. You may eat and drink normally. ?After the echocardiogram is completed, you may return to your normal, everyday life, unless your health care provider tells you not to do that. ?This information is  not intended to replace advice given to you by your health care provider. Make sure you discuss any questions you have with your health care provider. ?Document Revised: 08/21/2021 Document Reviewed: 07/31/2020 ?Elsevier Patient Education ? [place]. ? ?

## 2022-03-17 NOTE — Progress Notes (Signed)
Cardiology Office Note ? ?Date:  03/17/2022  ? ?ID:  Jennifer Schmitt, DOB [birth year], MRN [id number] ? ?PCP:  Venia Carbon, MD  ? ?Chief Complaint  ?Patient presents with  ? 12 month follow up   ?  Patient c/o shortness of breath, weakness and fatigue; has been sick since January 2023. Medications reviewed by the patient verbally.   ? ? ?HPI:  ?Jennifer Schmitt is a very pleasant 87 year old woman with history of  ?paroxysmal atrial fibrillation,  ?obstructive sleep apnea who does not wear CPAP,  ?hypertension,  ?hyperlipidemia  ?who presents for routine followup of her atrial fibrillation. ? ?She spends much of her winter in [place], has a cardiologist there ? ?Last seen in clinic November 2022 ?Metoprolol succinate  up to 50 for elevated heart rate ? ?Reports she is not doing well and wants to discuss ?Jan, cough, likely had viral URI ?loss of energy, weakness ?BP is very erratic ?Using a walker as she is weak ?Covid neg, rsv and flu neg ? ?Seen by urgent care for her cough, they did nothing. "  Throat was red" ? ?Friend who presents with her today is concerned about "Depression" ?Significant stress taking care of her husband who has dementia ?Has not been walking or exercising ? ?She has requested [facility] health for her husband [facility] ? ?Denies chest pain concerning for angina ?Mild shortness of breath on exertion just feels that her legs give out even with her walker ? ?EKG personally reviewed by myself on todays visit ?Shows normal sinus rhythm rate 69 bpm frequent PVCs in a trigeminal pattern ? ?Family history ?Parents with no CAD, no MI ?Grandmother with CHF ? ?Other past medical history reviewed ?Carotid u/s 2011: intimal thickening ?She reports that she had recent screening, was told she had no significant carotid disease. She will bring in the report for our review ?  ?05/31/2015 had severe vomiting, went to the emergency room ?Electrolytes were normal, no atrial fibrillation ?Lab work reviewed with her  today from recent lab draw, total cholesterol 160 ?B-12 improving, sedimentation rate 40 ?  ?She reports having an episode of atrial fibrillation in August 2015 ?It lasted approximately 6 hours. She took diltiazem and propranolol.  she converted back to normal sinus rhythm ? ?PMH:   has a past medical history of Adenomatous colon polyp, Allergic rhinitis due to pollen, Anemia, Anxiety, Bronchiectasis ([place]), Complication of anesthesia, Degenerative disc disease, Dysrhythmia, GERD (gastroesophageal reflux disease), Hearing loss in right ear, Heart murmur, HOH (hard of hearing), Hyperlipidemia, Hypertension, Osteoarthritis, multiple sites, PAF (paroxysmal atrial fibrillation) ([place]), PMR (polymyalgia rheumatica) (HCC), PONV (postoperative nausea and vomiting), Rheumatic fever, Sleep apnea, Urge incontinence, Vertigo, and Vertigo. ? ?PSH:    ?Past Surgical History:  ?Procedure Laterality Date  ? ABDOMINAL HYSTERECTOMY    ? ANKLE FRACTURE SURGERY Left 1996  ? BACK SURGERY    ? BREAST BIOPSY Left 2010  ? negative  ? CATARACT EXTRACTION W/PHACO Left 10/05/2018  ? Procedure: CATARACT EXTRACTION PHACO AND INTRAOCULAR LENS PLACEMENT (IOC);  Surgeon: Birder Robson, MD;  Location: [facility] ORS;  Service: Ophthalmology;  Laterality: Left;  [place] 00:40 ?CDE 6.92 ?fluid pack lot # [id number] ? ?   ? CATARACT EXTRACTION W/PHACO Right 10/26/2018  ? Procedure: CATARACT EXTRACTION PHACO AND INTRAOCULAR LENS PLACEMENT (IOC);  Surgeon: Birder Robson, MD;  Location: [facility] ORS;  Service: Ophthalmology;  Laterality: Right;  [place]  00:47 ?CDE 7.11 ?Fluid pack lot # [id number]  ? DEXA  11/09/06  ? normal  ?  FRACTURE SURGERY    ? Meningitis  1963  ? SPINE SURGERY    ? SYNOVECTOMY Right 1973  ? elbow  ? TONSILLECTOMY AND ADENOIDECTOMY    ? ? ?Current Outpatient Medications  ?Medication Sig Dispense Refill  ? acetaminophen (TYLENOL) 500 MG tablet Take 1,000 mg by mouth as needed.    ? Cholecalciferol (VITAMIN D) 2000 units CAPS Take 2,000 Units by mouth  daily.    ? cyanocobalamin (,VITAMIN B-12,) 1000 MCG/ML injection INJECT 1 ML MONTHLY AS DIRECTED 10 mL 1  ? diltiazem (CARDIZEM) 30 MG tablet Take 1 tablet (30 mg total) by mouth 3 (three) times daily as needed (atrial fibrillation lasting more than 2 hours). 60 tablet 3  ? estradiol (ESTRACE) 0.1 MG/GM vaginal cream Place 1 Applicatorful vaginally 3 (three) times a week. 42.5 g 12  ? fluticasone (FLONASE) 50 MCG/ACT nasal spray Place 1 spray into both nostrils daily.    ? folic acid (FOLVITE) 1 MG tablet Take 1 tablet (1 mg total) by mouth daily. 90 tablet 3  ? hydrocortisone 2.5 % cream Apply topically 3 (three) times daily as needed. 28 g 3  ? levocetirizine (XYZAL) 5 MG tablet Take 5 mg by mouth every evening.    ? LORazepam (ATIVAN) 0.5 MG tablet TAKE ONE HALF TO 1 TABLET BY MOUTH TWICEDAILY AS NEEDED FOR ANXIETY 30 tablet 0  ? metoprolol succinate (TOPROL-XL) 50 MG 24 hr tablet Take 0.5 tablets (25 mg total) by mouth daily. 1 tablet 0  ? propranolol (INDERAL) 10 MG tablet Take 1 tablet (10 mg total) by mouth 3 (three) times daily as needed (atrial fibrillation lasting more than 2 hours). 60 tablet 6  ? rivaroxaban (XARELTO) 20 MG TABS tablet TAKE 1 TABLET BY MOUTH EVERY DAY WITH THE EVENING MEAL 90 tablet 1  ? rosuvastatin (CRESTOR) 5 MG tablet Take 1 tablet (5 mg total) by mouth daily. 90 tablet 3  ? Trolamine Salicylate (ASPERCREME EX) Apply topically as needed.    ? valsartan (DIOVAN) 80 MG tablet TAKE 1 TABLET(80 MG) BY MOUTH DAILY 90 tablet 3  ? ?No current facility-administered medications for this visit.  ? ? ?Allergies:   Actonel [risedronate sodium], Boniva [ibandronic acid], Diclofenac, Fosamax [alendronate sodium], Lipitor [atorvastatin], Miacalcin [calcitonin (salmon)], Myrbetriq [mirabegron], Talwin [pentazocine], Latex, and Niaspan [niacin er]  ? ?Social History:  The patient  reports that she has never smoked. She has never used smokeless tobacco. She reports current alcohol use. She reports  that she does not use drugs.  ? ?Family History:   family history includes Arthritis in her father and mother; Breast cancer (age of onset: 46) in her paternal aunt; Cancer in her father; Colon cancer in her maternal grandmother; Diabetes in her father; Heart disease in her mother; Ovarian cancer in her maternal grandmother; Rheum arthritis in her mother; Schizophrenia in her mother; Uterine cancer in her maternal grandmother.  ? ? ?Review of Systems: ?Review of Systems  ?Constitutional: Negative.   ?Respiratory: Negative.    ?Cardiovascular: Negative.   ?Gastrointestinal: Negative.   ?Musculoskeletal: Negative.   ?Neurological: Negative.   ?Psychiatric/Behavioral: Negative.    ?All other systems reviewed and are negative. ? ?PHYSICAL EXAM: ?VS:  BP 130/70 (BP Location: Left Arm, Patient Position: Sitting, Cuff Size: Normal)   Pulse 69   Ht '5\' 5"'$  (1.651 m)   Wt 184 lb 6 oz (83.6 kg)   SpO2 98%   BMI 30.68 kg/m?  , BMI Body mass index is 30.68 kg/m?Marland Kitchen ?Constitutional:  oriented to person, place, and time. No distress.  ?HENT:  ?Head: Grossly normal ?Eyes:  no discharge. No scleral icterus.  ?Neck: No JVD, no carotid bruits  ?Cardiovascular: Regular rate and rhythm, no murmurs appreciated ?Pulmonary/Chest: Clear to auscultation bilaterally, no wheezes or rails ?Abdominal: Soft.  no distension.  no tenderness.  ?Musculoskeletal: Normal range of motion ?Neurological:  normal muscle tone. Coordination normal. No atrophy ?Skin: Skin warm and dry ?Psychiatric: normal affect, pleasant ? ?Recent Labs: ?03/04/2022: ALT 47; BUN 15; Creatinine, Ser 0.71; Hemoglobin 12.6; Platelets 225.0; Potassium 4.5; Sodium 139  ? ?Lipid Panel ?Lab Results  ?Component Value Date  ? CHOL 146 11/29/2021  ? HDL 59.70 11/29/2021  ? [place] 68 11/29/2021  ? TRIG 92.0 11/29/2021  ? ? ? ?Wt Readings from Last 3 Encounters:  ?03/17/22 184 lb 6 oz (83.6 kg)  ?03/04/22 184 lb (83.5 kg)  ?01/22/22 188 lb (85.3 kg)  ?  ? ?ASSESSMENT AND  PLAN: ? ?Paroxysmal atrial fibrillation (HCC) -  ?Maintaining normal sinus rhythm ?Metoprolol succinate decreased down to 25 mg out of concern for fatigue ?Low heart rate on visit with Dr. Silvio Pate likely from PVCs ?Today

## 2022-03-19 ENCOUNTER — Ambulatory Visit: Payer: Medicare PPO | Admitting: Family Medicine

## 2022-03-19 ENCOUNTER — Other Ambulatory Visit: Payer: Self-pay

## 2022-03-19 ENCOUNTER — Encounter: Payer: Self-pay | Admitting: Family Medicine

## 2022-03-19 ENCOUNTER — Ambulatory Visit (INDEPENDENT_AMBULATORY_CARE_PROVIDER_SITE_OTHER)
Admission: RE | Admit: 2022-03-19 | Discharge: 2022-03-19 | Disposition: A | Discharge status: Home / Self Care | Payer: Medicare PPO | Source: Ambulatory Visit | Attending: Family Medicine | Admitting: Family Medicine

## 2022-03-19 VITALS — BP 140/64 | HR 74 | Temp 98.2°F | Ht 65.75 in | Wt 183.4 lb

## 2022-03-19 DIAGNOSIS — M545 Low back pain, unspecified: Secondary | ICD-10-CM

## 2022-03-19 DIAGNOSIS — M533 Sacrococcygeal disorders, not elsewhere classified: Secondary | ICD-10-CM | POA: Diagnosis not present

## 2022-03-19 DIAGNOSIS — W19XXXA Unspecified fall, initial encounter: Secondary | ICD-10-CM | POA: Diagnosis not present

## 2022-03-19 NOTE — Progress Notes (Addendum)
? ? ?Jennifer Henegar T. Bravery Ketcham, MD, [facility] ?Therapist, music at [facility] ?[place] ?[place], [address] ? ?Phone: [phone]  FAX: [phone] ? ?Jennifer Schmitt - 87 y.o. female  MRN [id number]  Date of Birth: 03/23/1935 ? ?Date: 03/19/2022  PCP: Venia Carbon, MD  Referral: Venia Carbon, MD ? ?Chief Complaint  ?Patient presents with  ?? Back Pain  ?? Fall  ?  On Friday  ? ? ?This visit occurred during the SARS-CoV-2 public health emergency.  Safety protocols were in place, including screening questions prior to the visit, additional usage of staff PPE, and extensive cleaning of exam room while observing appropriate contact time as indicated for disinfecting solutions.  ? ?Subjective:  ? ?Jennifer Schmitt is a 87 y.o. very pleasant female patient with Body mass index is 29.82 kg/m?. who presents with the following: ? ?The patient fell on Friday, and now she has back pain. ? ?Tripped going to the bathroom and hit her back. ? ?Now cannot stand up straight, and she is having a lot of problems walking.  ?After she bumped her back, trouble getting up and getting out of the bed.  ?Husband and her live at [place].  ? ?She is feeling somewhat better today compared to yesterday ?Pain is isolated to the lumbar spine.  She also has some pain in the upper sacrum.  No pain in the remainder of the pelvis.  No pain in the hip anteriorly or posterior and no pain in the femur. ? ?She does not have any bruising. ? ?No radicular pain, numbness, tingling or weakness. ? ?20 years - L5-S1. ? ? ? ?Review of Systems is noted in the HPI, as appropriate  ? ?Objective:  ? ?BP 140/64   Pulse 74   Temp 98.2 ?F (36.8 ?C) (Oral)   Ht 5' 5.75" (1.67 m)   Wt 183 lb 6 oz (83.2 kg)   SpO2 98%   BMI 29.82 kg/m?  ? ?She does have some tenderness roughly at L4-5, more on the right side as well as some pain in the SI joint/upper sacrum on the right. ? ?No bruising, no swelling. ? ?No tenderness  along the spinous processes.  No tenderness throughout the entirety of the pelvis. ? ?Strength and sensation are intact throughout the entirety of the lower extremities ? ?No echymosis or edema ?Rises to examination table with no difficulty ?Gait: non antalgic ? ?B Ankle Dorsiflexion (L5,4): 5/5 ?B Great Toe Dorsiflexion (L5,4): 5/5 ?Rise/Squat (L4): WNL ? ?SENSORY ?B Medial Foot (L4): WNL ?B Dorsum (L5): WNL ?B Lateral (S1): WNL ?Light Touch: WNL ?Pinprick: WNL ? ?B SLR, seated: neg ?B Greater Troch: NT ?B Log Roll: neg ?B Sciatic Notch: Tender palpation on the right ? ?Radiology: ?DG Lumbar Spine Complete ? ?Result Date: 03/19/2022 ?CLINICAL DATA:  Chronic low back pain. Remote back surgery. Recent fall with increased back pain. EXAM: LUMBAR SPINE - COMPLETE 4+ VIEW COMPARISON:  None. FINDINGS: There are 5 lumbar type vertebral bodies. The bones appear mildly demineralized. No evidence of acute fracture or traumatic subluxation. There is a minimal degenerative anterolisthesis at L4-5. Disc space narrowing at L5-S1 may be developmental. Facet degenerative changes are present inferiorly. There is aortoiliac atherosclerosis. IMPRESSION: No evidence of acute lumbar spine injury. Mild spondylosis as described. Electronically Signed   By: Richardean Sale M.D.   On: 03/19/2022 12:49  ? ?DG Sacrum/Coccyx ? ?Result Date: 03/19/2022 ?CLINICAL DATA:  Upper sacral pain after a  fall. EXAM: SACRUM AND COCCYX - 2+ VIEW COMPARISON:  None. FINDINGS: No definite acute osseous abnormality. Degenerative changes in the spine. Atherosclerotic calcification of the aorta. IMPRESSION: No definite acute fracture. If there is continued clinical concern, CT pelvis without contrast is recommended. Electronically Signed   By: Lorin Picket M.D.   On: 03/19/2022 12:48    ? ?Assessment and Plan:  ? ?  ICD-10-CM   ?1. Fall, initial encounter  W19.XXXA MR SACRUM SI JOINTS WO CONTRAST  ?  ?2. Acute bilateral low back pain without sciatica  M54.50  DG Lumbar Spine Complete  ?  DG Sacrum/Coccyx  ?  MR SACRUM SI JOINTS WO CONTRAST  ?  ?3. Sacral pain  M53.3 DG Lumbar Spine Complete  ?  DG Sacrum/Coccyx  ?  MR SACRUM SI JOINTS WO CONTRAST  ?  ? ?Total encounter time: 31 minutes. This includes total time spent on the day of encounter.  This includes discussion, exam.  Additional time spent on independent review of lumbar spine as well as sacral images. ? ?While an occult fracture cannot be excluded, clinically along with image review, I am doubtful that this is the case.  This can be managed with expectant management only. ? ?Tylenol as needed for pain.  She knows that she should avoid NSAIDs. ? ?Addendum: 03/26/22 5:47 PM  ?The patient has called back and she is doing worse with fairly severe pain in the upper pelvis and sacrum.  Obtain and MRI of the sacrum without contrast to evaluation for an occult fracture.  ? ?No orders of the defined types were placed in this encounter. ? ?There are no discontinued medications. ?Orders Placed This Encounter  ?Procedures  ?? DG Lumbar Spine Complete  ?? DG Sacrum/Coccyx  ?? MR SACRUM SI JOINTS WO CONTRAST  ? ? ?Follow-up: No follow-ups on file. ? ?Dragon [employer] One speech-to-text software was used for transcription in this dictation.  Possible transcriptional errors can occur using Editor, commissioning.  ? ?Signed, ? ?Africa Masaki T. Khing Belcher, MD ? ? ?Outpatient Encounter Medications as of 03/19/2022  ?Medication Sig  ?? acetaminophen (TYLENOL) 500 MG tablet Take 1,000 mg by mouth as needed.  ?? Cholecalciferol (VITAMIN D) 2000 units CAPS Take 2,000 Units by mouth daily.  ?? cyanocobalamin (,VITAMIN B-12,) 1000 MCG/ML injection INJECT 1 ML MONTHLY AS DIRECTED  ?? diltiazem (CARDIZEM) 30 MG tablet Take 1 tablet (30 mg total) by mouth 3 (three) times daily as needed (atrial fibrillation lasting more than 2 hours).  ?? estradiol (ESTRACE) 0.1 MG/GM vaginal cream Place 1 Applicatorful vaginally 3 (three) times a week.  ?? fluticasone  (FLONASE) 50 MCG/ACT nasal spray Place 1 spray into both nostrils daily.  ?? folic acid (FOLVITE) 1 MG tablet Take 1 tablet (1 mg total) by mouth daily.  ?? hydrocortisone 2.5 % cream Apply topically 3 (three) times daily as needed.  ?? levocetirizine (XYZAL) 5 MG tablet Take 5 mg by mouth every evening.  ?? LORazepam (ATIVAN) 0.5 MG tablet TAKE ONE HALF TO 1 TABLET BY MOUTH TWICEDAILY AS NEEDED FOR ANXIETY  ?? metoprolol succinate (TOPROL-XL) 50 MG 24 hr tablet Take 0.5 tablets (25 mg total) by mouth daily.  ?? propranolol (INDERAL) 10 MG tablet Take 1 tablet (10 mg total) by mouth 3 (three) times daily as needed (atrial fibrillation lasting more than 2 hours).  ?? rivaroxaban (XARELTO) 20 MG TABS tablet TAKE 1 TABLET BY MOUTH EVERY DAY WITH THE EVENING MEAL  ?? rosuvastatin (CRESTOR) 5 MG tablet Take  1 tablet (5 mg total) by mouth daily.  ?? Trolamine Salicylate (ASPERCREME EX) Apply topically as needed.  ?? valsartan (DIOVAN) 80 MG tablet TAKE 1 TABLET(80 MG) BY MOUTH DAILY  ? ?No facility-administered encounter medications on file as of 03/19/2022.  ?  ?

## 2022-03-20 DIAGNOSIS — I493 Ventricular premature depolarization: Secondary | ICD-10-CM

## 2022-03-26 ENCOUNTER — Encounter: Payer: Self-pay | Admitting: Family Medicine

## 2022-03-26 NOTE — Addendum Note (Signed)
Addended by: Owens Loffler on: 03/26/2022 05:47 PM ? ? Modules accepted: Orders ? ?

## 2022-03-31 DIAGNOSIS — I493 Ventricular premature depolarization: Secondary | ICD-10-CM | POA: Diagnosis not present

## 2022-04-01 ENCOUNTER — Telehealth: Payer: Self-pay | Admitting: Emergency Medicine

## 2022-04-01 DIAGNOSIS — I4819 Other persistent atrial fibrillation: Secondary | ICD-10-CM

## 2022-04-01 DIAGNOSIS — R0602 Shortness of breath: Secondary | ICD-10-CM

## 2022-04-01 NOTE — Telephone Encounter (Signed)
Called patient to go over results. Pt reports she had just seen them on MyChart.  ? ?Patient reports that she is having a hard time, cannot do anything without her walker and get worn out very easily.  ? ?Wanted to know if an adjustment needs to be made to her metoprolol based on these results, as that had previously been discussed. Will send this to Dr. Rockey Situ for review.  ? ?Agreeable to be seen by EP. Will send message to scheduling. ? ?Pt verbalized understanding of everything discussed and voiced appreciation for the call.  ?

## 2022-04-01 NOTE — Telephone Encounter (Signed)
-----   Message from Minna Merritts, MD sent at 04/01/2022  1:43 PM EDT ----- ?Holter monitor ?Frequent PVCs reviewed, 22.5% burden  ?Would recommend that we set up a consultation with the EP for further discussion whether she needs antiarrhythmics ?Would suggest we do this after the echocardiogram she has scheduled ? ?

## 2022-04-01 NOTE — Telephone Encounter (Signed)
Patient concerned about wait time for next available EP appt 5-31 .  She states she doesn't want to wait that long for an answer and may die before then.  Added to waitlist .  ?

## 2022-04-02 MED ORDER — METOPROLOL SUCCINATE ER 50 MG PO TB24: 50.0000 mg | ORAL_TABLET | Freq: Every day | ORAL | 3 refills | Status: DC

## 2022-04-02 NOTE — Addendum Note (Signed)
Addended by: Mila Merry on: 04/02/2022 02:03 PM ? ? Modules accepted: Orders ? ?

## 2022-04-02 NOTE — Telephone Encounter (Signed)
If blood pressure allows would go back up to metoprolol succinate 50 daily  ?We need the echocardiogram also a Lexiscan Myoview to determine which antiarrhythmic could potentially be used  ?Certainly possible that suppression of her PVCs may not make her feel any different  ?Weakness, balance, needing a walker, gait instability may persist  ?Weakness likely may be worse following previous viral URI  ?Thx TGollan   ? ?Spoke with patient. Patient reports that her blood pressure has been in the 154M to 086P systolic. Would like to try going up to 50 mg on metoprolol succinate to see if that helps with symptoms. Increased dose sent into patient's preferred pharmacy.  ? ?Explained that MD would like to order a lexiscan myoview. Went over instructions with patient and told her that I would send written instructions to her MyChart. Pt verbalized understanding. Will send message to scheduling.  ? ?Patient relieved to hear that Dr. Rockey Situ thinks increased weakness could be related to recent viral illness.  ? ?Pt verbalized understanding of everything discussed and voiced appreciation for the call.  ?

## 2022-04-03 ENCOUNTER — Ambulatory Visit (INDEPENDENT_AMBULATORY_CARE_PROVIDER_SITE_OTHER): Payer: Medicare PPO

## 2022-04-03 DIAGNOSIS — R0602 Shortness of breath: Secondary | ICD-10-CM | POA: Diagnosis not present

## 2022-04-03 LAB — ECHOCARDIOGRAM COMPLETE
AR max vel: 2.68 cm2
AV Area VTI: 2.72 cm2
AV Area mean vel: 2.58 cm2
AV Mean grad: 7 mmHg
AV Peak grad: 12.5 mmHg
Ao pk vel: 1.77 m/s
Area-P 1/2: 2.96 cm2
Calc EF: 55.2 %
P 1/2 time: 503 msec
S' Lateral: 3 cm
Single Plane A2C EF: 55 %
Single Plane A4C EF: 54.8 %

## 2022-04-03 NOTE — Telephone Encounter (Signed)
Lmov  

## 2022-04-05 ENCOUNTER — Other Ambulatory Visit: Payer: Self-pay | Admitting: Internal Medicine

## 2022-04-07 NOTE — Telephone Encounter (Signed)
Is this okay to refill? 

## 2022-04-08 ENCOUNTER — Ambulatory Visit
Admission: RE | Admit: 2022-04-08 | Discharge: 2022-04-08 | Disposition: A | Discharge status: Home / Self Care | Payer: Medicare PPO | Source: Ambulatory Visit | Attending: Family Medicine | Admitting: Family Medicine

## 2022-04-08 DIAGNOSIS — K5730 Diverticulosis of large intestine without perforation or abscess without bleeding: Secondary | ICD-10-CM | POA: Diagnosis not present

## 2022-04-08 DIAGNOSIS — W19XXXA Unspecified fall, initial encounter: Secondary | ICD-10-CM

## 2022-04-08 DIAGNOSIS — M533 Sacrococcygeal disorders, not elsewhere classified: Secondary | ICD-10-CM

## 2022-04-08 DIAGNOSIS — M545 Low back pain, unspecified: Secondary | ICD-10-CM

## 2022-04-08 DIAGNOSIS — R600 Localized edema: Secondary | ICD-10-CM | POA: Diagnosis not present

## 2022-04-08 DIAGNOSIS — Z981 Arthrodesis status: Secondary | ICD-10-CM | POA: Diagnosis not present

## 2022-04-15 ENCOUNTER — Encounter: Payer: Self-pay | Admitting: Family Medicine

## 2022-04-15 DIAGNOSIS — M533 Sacrococcygeal disorders, not elsewhere classified: Secondary | ICD-10-CM

## 2022-04-15 DIAGNOSIS — M545 Low back pain, unspecified: Secondary | ICD-10-CM

## 2022-04-15 DIAGNOSIS — W19XXXA Unspecified fall, initial encounter: Secondary | ICD-10-CM

## 2022-04-16 ENCOUNTER — Telehealth: Payer: Self-pay | Admitting: Cardiovascular Disease

## 2022-04-16 NOTE — Telephone Encounter (Signed)
Called patient and reviewed her Myoview instructions with her. Patient verbalized understanding and was grateful for the call back. ?

## 2022-04-16 NOTE — Telephone Encounter (Signed)
Patient would like to discuss instructions for 4/28 stress test.  ?

## 2022-04-17 DIAGNOSIS — M3501 Sicca syndrome with keratoconjunctivitis: Secondary | ICD-10-CM | POA: Diagnosis not present

## 2022-04-18 ENCOUNTER — Ambulatory Visit
Admission: RE | Admit: 2022-04-18 | Discharge: 2022-04-18 | Disposition: A | Discharge status: Home / Self Care | Payer: Medicare PPO | Source: Ambulatory Visit | Attending: Cardiovascular Disease | Admitting: Cardiovascular Disease

## 2022-04-18 ENCOUNTER — Encounter: Payer: Self-pay | Admitting: Medical

## 2022-04-18 DIAGNOSIS — R0602 Shortness of breath: Secondary | ICD-10-CM | POA: Insufficient documentation

## 2022-04-18 NOTE — Progress Notes (Unsigned)
Signed    ?  ?   ?   ?Patient presented for her scheduled Myoview Lexiscan 04/18/22. Prior to the test I was called down to discuss the test with the patient. She was doubled over and complaining that she didn't feel well. Said she felt very anxious, nauseas and had severe back and neck pain. Said she was unable to take Tylenol and Lorazepam this AM due to NPO status prior to the test. Also reported progression of arthritis, now very severe and unable to lay flat or sit straight. Physical exam was benign. No chest pain or SOB. WE offered IV morphine, IV diazepam and Zofran but patient decided against it and canceled the test. She will follow-up with Dr. Rockey Situ to further discuss testing. Case was discussed with Dr. Rockey Situ.  ?  ?Jennifer Schmitt. PA-C  ?  ?  ? ?

## 2022-04-18 NOTE — Progress Notes (Signed)
Patient presented for her scheduled Myoview Lexiscan 04/18/22. Prior to the test I was called down to discuss the test with the patient. She was doubled over and complaining that she didn't feel well. Said she felt very anxious, nauseas and had severe back and neck pain. Said she was unable to take Tylenol and Lorazepam this AM due to NPO status prior to the test. Also reported progression of arthritis, now very severe and unable to lay flat or sit straight. Physical exam was benign. No chest pain or SOB. WE offered IV morphine, IV diazepam and Zofran but patient decided against it and canceled the test. She will follow-up with Dr. Rockey Situ to further discuss testing. Case was discussed with Dr. Rockey Situ.  ? ?Jennifer Schmitt. PA-C ?

## 2022-04-21 ENCOUNTER — Ambulatory Visit: Payer: Medicare PPO | Admitting: Family Medicine

## 2022-04-21 ENCOUNTER — Encounter: Payer: Self-pay | Admitting: Family Medicine

## 2022-04-21 VITALS — BP 126/64 | HR 50 | Temp 97.8°F | Ht 65.75 in | Wt 177.1 lb

## 2022-04-21 DIAGNOSIS — R269 Unspecified abnormalities of gait and mobility: Secondary | ICD-10-CM

## 2022-04-21 DIAGNOSIS — F32A Depression, unspecified: Secondary | ICD-10-CM

## 2022-04-21 DIAGNOSIS — R29810 Facial weakness: Secondary | ICD-10-CM | POA: Diagnosis not present

## 2022-04-21 DIAGNOSIS — R2990 Unspecified symptoms and signs involving the nervous system: Secondary | ICD-10-CM

## 2022-04-21 DIAGNOSIS — R29898 Other symptoms and signs involving the musculoskeletal system: Secondary | ICD-10-CM

## 2022-04-21 DIAGNOSIS — R2689 Other abnormalities of gait and mobility: Secondary | ICD-10-CM | POA: Diagnosis not present

## 2022-04-21 DIAGNOSIS — M549 Dorsalgia, unspecified: Secondary | ICD-10-CM | POA: Diagnosis not present

## 2022-04-21 DIAGNOSIS — R413 Other amnesia: Secondary | ICD-10-CM | POA: Diagnosis not present

## 2022-04-21 NOTE — Progress Notes (Signed)
? ? ?[name], MD, [facility] ?Therapist, music at [facility] ?[place] ?[place], [address] ? ?Phone: [phone]  FAX: [phone] ? ?Jennifer Schmitt - 87 y.o. female  MRN [id number]  Date of Birth: [birth year] ? ?Date: 04/21/2022  PCP: Venia Carbon, MD  Referral: Venia Carbon, MD ? ?Chief Complaint  ?Patient presents with  ?? Difficulty Walking  ?  Unable to stand or walk without assistance ?Can't life head up ?  ? ? ?This visit occurred during the SARS-CoV-2 public health emergency.  Safety protocols were in place, including screening questions prior to the visit, additional usage of staff PPE, and extensive cleaning of exam room while observing appropriate contact time as indicated for disinfecting solutions.  ? ?Subjective:  ? ?Jennifer Schmitt is a 87 y.o. very pleasant female patient with Body mass index is 28.8 kg/m?. who presents with the following: ? ?Patient is here for musculoskeletal follow-up, but in general she is doing quite poorly.  At baseline she is a highly educated and functional individual. ? ?I saw her one month ago, and at that point she seemed to be moving slowly, but she was ambulating with a cane.  Some pain in the lowest portion of her back, and she was also having upper sacral pain. ? ?She messaged me that pain was persistent, and MRI of the sacrum was ordered which did not show an occult fracture, and some muscle edema with atrophy. ? ?At this point, the patient is seen in a wheelchair in the office.  She is accompanied by her very close friend of 65 years.  Her friend provides additional history. ? ?Patient describes pain from her neck all the way down to her tailbone.  Pain with movement, pain with any kind of rotational movement. ? ?She also describes a sensation of giving way and falling while she is standing up walking.  She is unable to stand without assistance. ?-She does not feel like she can lift her head up all that  well. ? ?She also describes weakness on the left lower extremity throughout. ?She also describes some visual changes on the left eye.  She also has ongoing ocular problems, and she is going to be seeing her ophthalmologist soon. ?-She and her friend also report dragging her right leg while ambulating and while ambulating being bent over somewhat. ?-All of this is recent and roughly within the last 2 months ?-She and notably her friend of many years feel as if she has not been thinking is clearly over the last 2 months ? ?Dealing some with dementia in her husband.  Worried some about her own memory.  Severely depressed and all at one time. ?- a great deal of depression and anxiety. ?- Feels entirely overwhelmed.   ? ?She was scheduled to have a cardiac stress test last Friday. ?Could not lie down. ?Could not do an MRI of her brain?  Versus CT.  Dr. Tami Ribas from ENT wanted her to get this. ?- February had a virus. ?- Dr. Tami Ribas wanted to get an MRI of her brain ? ?I pulled up the patient's MRI of her sacrum myself, and there is no evidence of bone edema from L4-S1 as well as no significant bone edema in the sacrum itself.  No evidence of fracture. ? ?Also was having a lot of discomfort with stomach and gallbladder.  ? ?Ongoing since November.  ?- The last couple of months, felt like not thinking as  clearly ? ?December, felt ok, wsa going to drive to [place].  Now doing really poorly. ?- Cannot use computer.  ? ?SN, PT, OT at home.  ?- getting worse daily. ? ?R face ?L eye problems ?L hip - incredibly weak ? ?Dragging R leg ?L knee ext and flexion decreased ? ?HH - has through [facility]. ? ?[phone] home ?[phone] cell ? ? ? ?Cardiology 04/18/2022 notes are reviewed. ?"Patient presented for her scheduled Myoview Lexiscan 04/18/22. Prior to the test I was called down to discuss the test with the patient. She was doubled over and complaining that she didn't feel well. Said she felt very anxious, nauseas and had  severe back and neck pain. Said she was unable to take Tylenol and Lorazepam this AM due to NPO status prior to the test. Also reported progression of arthritis, now very severe and unable to lay flat or sit straight. Physical exam was benign. No chest pain or SOB. WE offered IV morphine, IV diazepam and Zofran but patient decided against it and canceled the test. She will follow-up with Dr. Rockey Situ to further discuss testing. Case was discussed with Dr. Rockey Situ. ?Cadence Furth. PA-C" ? ?Review of Systems is noted in the HPI, as appropriate ? ?Objective:  ? ?BP 126/64   Pulse (!) 50   Temp 97.8 ?F (36.6 ?C) (Oral)   Ht 5' 5.75" (1.67 m)   Wt 177 lb 1 oz (80.3 kg)   SpO2 94%   BMI 28.80 kg/m?  ? ?GEN: she appears in mild to moderate pain, mild distress ?PULM: Breathing comfortably in no respiratory distress ? ?PSYCH: she appears anxious, somewhat labile ? ?She is sitting in a wheelchair, and has difficulty standing as well as balancing while standing up. ? ?Cervical spine through thoracic spine as well as the lumbar spine.  Patient has no tenderness along the spinous processes or along the midline of the spine.  She does have some mild tenderness in the paraspinous region from the thoracic spine all the way to the lumbar spine. ? ?There is some mild SI joint tenderness. ? ?She also has a roughly 50% loss of motion in all directions with movement in the cervical spine with pain in all terminal motion. ? ?Grossly full range of motion at the hip while seated. ?No significant pelvic tenderness with motion. ?Nontender along the clavicles, humerus, radius, ulna, carpal bones, metacarpals, and roughly no significant tenderness at the tibia, fibula, and the palpable fever. ? ?Neurological exam: ?Cranial nerve exam: ? ?The patient while attempting to frown has decreased motion and frown on the right side compared to the contralateral side ? ?The remainder of the cranial nerve exam 2 through 12 is normal. ? ?Sensory exam  is normal throughout the face through the upper extremities down through the lower extremities and is tired. ? ?Patient has notable decreased hip flexion as well as both extension and flexion at the knee. ?Strength testing is 3/5 at the knee ?Hip flexion is 2/5 ?Dorsiflexion, plantarflexion as well as extension at the toe is 4+/5 ? ?When attempting to stand from the wheelchair, the patient requires marked assistance, and I did not feel safe having the patient do additional balance testing, Romberg, or any other additional balance testing. ? ?Finger-nose is normal. ? ?Laboratory and Imaging Data: ? ? ?  Latest Ref Rng & Units 03/04/2022  ? 12:21 PM 11/29/2021  ?  8:40 AM 06/26/2021  ?  8:57 AM  ?CBC  ?WBC 4.0 - 10.5 K/uL 5.7  3.7   5.0    ?Hemoglobin 12.0 - 15.0 g/dL 12.6   12.1   12.8    ?Hematocrit 36.0 - 46.0 % 37.3   36.4   38.0    ?Platelets 150.0 - 400.0 K/uL 225.0   204.0   244.0    ?  ?Lab Results  ?Component Value Date  ? ESRSEDRATE 30 03/04/2022  ?  ? ?  Latest Ref Rng & Units 03/04/2022  ? 12:21 PM 11/29/2021  ?  8:40 AM 06/26/2021  ?  8:57 AM  ?CMP  ?Glucose 70 - 99 mg/dL 94   91   90    ?BUN 6 - 23 mg/dL '15   19   15    '$ ?Creatinine 0.40 - 1.20 mg/dL 0.71   0.81   0.84    ?Sodium 135 - 145 mEq/L 139   140   141    ?Potassium 3.5 - 5.1 mEq/L 4.5   4.7   5.0    ?Chloride 96 - 112 mEq/L 101   103   104    ?CO2 19 - 32 mEq/L 32   30   31    ?Calcium 8.4 - 10.5 mg/dL 9.4   9.2   9.4    ?Total Protein 6.0 - 8.3 g/dL 6.7   6.7   7.4    ?Total Bilirubin 0.2 - 1.2 mg/dL 0.5   0.5   0.4    ?Alkaline Phos 39 - 117 U/L 65   71   58    ?AST 0 - 37 U/L 34   14   17    ?ALT 0 - 35 U/L 47   10   14    ?  ? ?Assessment and Plan:  ? ?  ICD-10-CM   ?1. Abnormal neurological exam  R29.90   ?  ?2. Facial weakness  R29.810   ?  ?3. Weakness of left leg  R29.898   ?  ?4. Imbalance  R26.89   ?  ?5. Gait disturbance  R26.9   ?  ?6. Memory change  R41.3   ?  ?7. Acute back pain, unspecified back location, unspecified back pain laterality   M54.9   ?  ?8. Acute depression  F32.A   ?  ? ?Total encounter time: 46 minutes. This includes total time spent on the day of encounter.   ? ?The patient has changed significantly since my evaluation after a fall with b

## 2022-04-21 NOTE — Patient Instructions (Signed)
Please keep your phone on for further instruction. ?

## 2022-04-22 ENCOUNTER — Telehealth: Payer: Self-pay | Admitting: *Deleted

## 2022-04-22 NOTE — Telephone Encounter (Signed)
Jennifer Schmitt called in to say [facility] contacted her today and they are unable to schedule patient there for her MRI since she is unable to lay flat on the table and they are not able to assist patient out of the wheelchair to the table.  They told her she would have to have her MRI done at the hospital.  She also wanted to let Dr.Copland/Letvak know that she is very nauseated and not doing well.  Please advise.   ?

## 2022-04-22 NOTE — Addendum Note (Signed)
Addended by: Carter Kitten on: 04/22/2022 04:39 PM ? ? Modules accepted: Orders ? ?

## 2022-04-22 NOTE — Telephone Encounter (Signed)
Location changed on order.  Will forward to [place] to get patient scheduled at the hospital.  ?

## 2022-04-22 NOTE — Telephone Encounter (Signed)
Precert will have to be redone to change the location.  ? ?Once this is done the patient can call to schedule their appt.  ? ?[facility] Scheduling. [phone] ?

## 2022-04-23 ENCOUNTER — Other Ambulatory Visit
Admission: RE | Admit: 2022-04-23 | Discharge: 2022-04-23 | Disposition: A | Discharge status: Home / Self Care | Payer: Medicare PPO | Attending: Ophthalmology | Admitting: Ophthalmology

## 2022-04-23 DIAGNOSIS — M315 Giant cell arteritis with polymyalgia rheumatica: Secondary | ICD-10-CM | POA: Insufficient documentation

## 2022-04-23 LAB — PLATELET COUNT: Platelets: 309 10*3/uL (ref 150–400)

## 2022-04-23 LAB — SEDIMENTATION RATE: Sed Rate: 101 mm/hr — ABNORMAL HIGH (ref 0–30)

## 2022-04-23 LAB — C-REACTIVE PROTEIN: CRP: 10.1 mg/dL — ABNORMAL HIGH (ref <1.0)

## 2022-04-23 NOTE — Telephone Encounter (Signed)
New Auth obtained for [facility] locations - They only schedule MRIs at the Hospital with [facility] ?They can assist patient with getting on the exam table.  ? ?They were able to schedule her 04/24/2022 at [facility] Entrance) - check in at Radiology desk ?Arrive at 11:30am, appt is scheduled for 12:00pm ? ? ?They were not able to get her in today at [facility] due to being booked 100%.  ?

## 2022-04-23 NOTE — Telephone Encounter (Signed)
Currently waiting on Jennifer Schmitt to redo precert.  ?

## 2022-04-23 NOTE — Telephone Encounter (Signed)
The orders look changed.   ? ?Butch Penny, can you confirm that she has the appointment time? ?

## 2022-04-23 NOTE — Telephone Encounter (Signed)
MRI scheduled for 04/27/2022 at 11:00 am at [facility].  ?

## 2022-04-23 NOTE — Telephone Encounter (Signed)
The patient will need to contact scheduling to cancel her appt for tomorrow 04/23/2022 and reschedule ?[place] Scheduling [phone] ? ?I have already done the precert twice - once for [place] and then second for [facility] locations.  ?[facility] was not able to scan the patient due to her not being able to get on the exam table on her own and needing assistance out of the wheelchair, they stated that she needed to go to the Hospital for this scan. The order was updated showing [facility] as the requested location. The Prec[facility] was [facility] and the patient scheduled for tomorrow 04/24/22 at 12pm.  ? ?She can call [facility] Scheduling and have them schedule her at [facility] on Friday 04/25/22 ? ?Attempted to call patient, was not able to leave voicemail - voicemail kept cutting off in the middle the message and asking me to re-record and would not save a voicemail.  ?

## 2022-04-23 NOTE — Telephone Encounter (Signed)
Left message for Jennifer Schmitt to please call [facility] Scheduling at [phone] to cancel and reschedule her MRI appointment.  ?

## 2022-04-23 NOTE — Telephone Encounter (Signed)
Patient states she can't do the MRI tomorrow, cancel the appointment ? ?Patient states she does have availability on Friday 5.5.23 ? ?[employer] called her also and stated that she could do a [place] location, we may have received a fax from [facility] ? ?Please contact the patient at [phone] ?

## 2022-04-23 NOTE — Telephone Encounter (Signed)
Pt return call in was advise to call [facility] to schedule appointment ?

## 2022-04-24 ENCOUNTER — Ambulatory Visit: Payer: Medicare PPO

## 2022-04-24 NOTE — Telephone Encounter (Signed)
Noted.  ? ?Thank you so much for all your help reaching and informing the patient. Its much appreciated! Glad we were able to get her taken care of quickly.  ?

## 2022-04-25 ENCOUNTER — Telehealth: Payer: Self-pay | Admitting: Internal Medicine

## 2022-04-25 NOTE — Telephone Encounter (Signed)
Pt saw [place] [facility] and they said she breakdown in skin at the base of her spine and said it needed to addressed fairly quickly, they recommended desitin ointment or some kind of cream, she said the nurse is going to go get some cream for it today. She scheduled an appt with Dr Silvio Pate for Monday as well ?

## 2022-04-27 ENCOUNTER — Ambulatory Visit
Admission: RE | Admit: 2022-04-27 | Discharge: 2022-04-27 | Disposition: A | Discharge status: Home / Self Care | Payer: Medicare PPO | Source: Ambulatory Visit | Attending: Family Medicine | Admitting: Family Medicine

## 2022-04-27 DIAGNOSIS — R29818 Other symptoms and signs involving the nervous system: Secondary | ICD-10-CM | POA: Diagnosis not present

## 2022-04-27 DIAGNOSIS — R262 Difficulty in walking, not elsewhere classified: Secondary | ICD-10-CM | POA: Diagnosis not present

## 2022-04-27 DIAGNOSIS — M549 Dorsalgia, unspecified: Secondary | ICD-10-CM | POA: Insufficient documentation

## 2022-04-27 DIAGNOSIS — R2689 Other abnormalities of gait and mobility: Secondary | ICD-10-CM | POA: Diagnosis not present

## 2022-04-27 DIAGNOSIS — R269 Unspecified abnormalities of gait and mobility: Secondary | ICD-10-CM | POA: Insufficient documentation

## 2022-04-27 DIAGNOSIS — R29898 Other symptoms and signs involving the musculoskeletal system: Secondary | ICD-10-CM | POA: Diagnosis not present

## 2022-04-27 DIAGNOSIS — Z8673 Personal history of transient ischemic attack (TIA), and cerebral infarction without residual deficits: Secondary | ICD-10-CM | POA: Diagnosis not present

## 2022-04-27 DIAGNOSIS — R29810 Facial weakness: Secondary | ICD-10-CM | POA: Insufficient documentation

## 2022-04-27 DIAGNOSIS — R413 Other amnesia: Secondary | ICD-10-CM | POA: Diagnosis not present

## 2022-04-27 DIAGNOSIS — R2990 Unspecified symptoms and signs involving the nervous system: Secondary | ICD-10-CM | POA: Insufficient documentation

## 2022-04-27 MED ORDER — GADOBUTROL 1 MMOL/ML IV SOLN
7.5000 mL | Freq: Once | INTRAVENOUS | Status: AC | PRN
  Administered 2022-04-27: 7.5 mL via INTRAVENOUS

## 2022-04-28 ENCOUNTER — Ambulatory Visit (INDEPENDENT_AMBULATORY_CARE_PROVIDER_SITE_OTHER): Payer: Medicare PPO | Admitting: Vascular Surgery

## 2022-04-28 ENCOUNTER — Encounter: Payer: Self-pay | Admitting: Internal Medicine

## 2022-04-28 ENCOUNTER — Ambulatory Visit (INDEPENDENT_AMBULATORY_CARE_PROVIDER_SITE_OTHER): Payer: Medicare PPO | Admitting: Internal Medicine

## 2022-04-28 ENCOUNTER — Encounter (INDEPENDENT_AMBULATORY_CARE_PROVIDER_SITE_OTHER): Payer: Self-pay | Admitting: Vascular Surgery

## 2022-04-28 VITALS — BP 147/77 | HR 76 | Resp 17

## 2022-04-28 VITALS — BP 140/80 | HR 43 | Temp 97.3°F | Ht 65.75 in | Wt 175.0 lb

## 2022-04-28 DIAGNOSIS — M6281 Muscle weakness (generalized): Secondary | ICD-10-CM | POA: Diagnosis not present

## 2022-04-28 DIAGNOSIS — M353 Polymyalgia rheumatica: Secondary | ICD-10-CM

## 2022-04-28 DIAGNOSIS — H539 Unspecified visual disturbance: Secondary | ICD-10-CM

## 2022-04-28 DIAGNOSIS — L89322 Pressure ulcer of left buttock, stage 2: Secondary | ICD-10-CM | POA: Diagnosis not present

## 2022-04-28 DIAGNOSIS — K219 Gastro-esophageal reflux disease without esophagitis: Secondary | ICD-10-CM

## 2022-04-28 DIAGNOSIS — I10 Essential (primary) hypertension: Secondary | ICD-10-CM

## 2022-04-28 DIAGNOSIS — I4819 Other persistent atrial fibrillation: Secondary | ICD-10-CM | POA: Diagnosis not present

## 2022-04-28 NOTE — Progress Notes (Signed)
? ?Subjective:  ? ? Patient ID: Jennifer Schmitt, female    DOB: January 27, 1935, 87 y.o.   MRN: [id number] ? ?HPI ?Here for follow up due to neurologic concerns ? ?Having a real hard time---can't walk without walker and mostly using wheelchair ?Nurse noted bedsore at [place]--and needed orders for that ?Prednisone started by ophthal---started it 2 days ago ?This hasn't helped  ? ?Had seen Dr Lorelei Pont over a month ago--after fall and hip pain ?Has been worsening since then ?Feels weak all over---even has trouble getting off the commode ?Is able to stand--but has to hold herself up with her hands--can't keep her head up even ?Trouble turning over---fell and couldn't even stand on her own ? ?Husband can still do some cleaning and laundry ?Now has accountant to help with bills, etc ?[employer] account executive, etc ? ?Current Outpatient Medications on File Prior to Visit  ?Medication Sig Dispense Refill  ? acetaminophen (TYLENOL) 500 MG tablet Take 1,000 mg by mouth as needed.    ? Cholecalciferol (VITAMIN D) 2000 units CAPS Take 2,000 Units by mouth daily.    ? cyanocobalamin (,VITAMIN B-12,) 1000 MCG/ML injection INJECT 1 ML MONTHLY AS DIRECTED 10 mL 1  ? diltiazem (CARDIZEM) 30 MG tablet Take 1 tablet (30 mg total) by mouth 3 (three) times daily as needed (atrial fibrillation lasting more than 2 hours). 60 tablet 3  ? estradiol (ESTRACE) 0.1 MG/GM vaginal cream Place 1 Applicatorful vaginally 3 (three) times a week. 42.5 g 12  ? fluticasone (FLONASE) 50 MCG/ACT nasal spray Place 1 spray into both nostrils daily.    ? folic acid (FOLVITE) 1 MG tablet Take 1 tablet (1 mg total) by mouth daily. 90 tablet 3  ? hydrocortisone 2.5 % cream Apply topically 3 (three) times daily as needed. 28 g 3  ? levocetirizine (XYZAL) 5 MG tablet Take 5 mg by mouth every evening.    ? LORazepam (ATIVAN) 0.5 MG tablet TAKE ONE HALF TO 1 TABLET BY MOUTH TWICEDAILY AS NEEDED FOR ANXIETY 30 tablet 0  ? metoprolol succinate  (TOPROL-XL) 50 MG 24 hr tablet Take 1 tablet (50 mg total) by mouth daily. 30 tablet 3  ? predniSONE (DELTASONE) 20 MG tablet Take 60 mg by mouth daily.    ? propranolol (INDERAL) 10 MG tablet Take 1 tablet (10 mg total) by mouth 3 (three) times daily as needed (atrial fibrillation lasting more than 2 hours). 60 tablet 6  ? rivaroxaban (XARELTO) 20 MG TABS tablet TAKE 1 TABLET BY MOUTH EVERY DAY WITH THE EVENING MEAL 90 tablet 1  ? rosuvastatin (CRESTOR) 5 MG tablet Take 1 tablet (5 mg total) by mouth daily. 90 tablet 3  ? Trolamine Salicylate (ASPERCREME EX) Apply topically as needed.    ? valsartan (DIOVAN) 80 MG tablet TAKE 1 TABLET(80 MG) BY MOUTH DAILY 90 tablet 3  ? ?No current facility-administered medications on file prior to visit.  ? ? ?Allergies  ?Allergen Reactions  ? Actonel [Risedronate Sodium] Nausea And Vomiting  ? Boniva [Ibandronic Acid] Nausea And Vomiting  ? Diclofenac Other (See Comments)  ?  Voltaren Gel aggravated her sinus passages  ? Fosamax [Alendronate Sodium] Nausea And Vomiting  ? Lipitor [Atorvastatin] Other (See Comments)  ?  Muscle aches. Tolerates Crestor.  ? Miacalcin [Calcitonin ([place])] Other (See Comments)  ?  Sneezing   ? Myrbetriq [Mirabegron] Other (See Comments)  ?  Gastritis  ? Talwin [Pentazocine] Other (See Comments)  ?  Hallucinations   ?  Latex Rash  ?  Sneezing, watery eyes.  ? Niaspan [Niacin Er] Swelling and Rash  ? ? ?Past Medical History:  ?Diagnosis Date  ? Adenomatous colon polyp   ? Allergic rhinitis due to pollen   ? Anemia   ? Anxiety   ? Bronchiectasis ([place])   ? Complication of anesthesia   ? Degenerative disc disease   ? cervical and lumbar  ? Dysrhythmia   ? GERD (gastroesophageal reflux disease)   ? Hearing loss in right ear   ? Heart murmur   ? HOH (hard of hearing)   ? AIDS  ? Hyperlipidemia   ? Hypertension   ? Osteoarthritis, multiple sites   ? PAF (paroxysmal atrial fibrillation) ([place])   ? a. 01/2013 Echo [facility]): EF 60-65%, no rwma, mildly  dil LA/RA, mild AI/TR, trace MR, nl RV size/fxn, mild PAH; b. CHA2DS2VASc = 4-->Chronic Xarelto.  ? PMR (polymyalgia rheumatica) (HCC)   ? PONV (postoperative nausea and vomiting)   ? Rheumatic fever   ? Sleep apnea   ? NO CPAP  ? Urge incontinence   ? Vertigo   ? Vertigo   ? ? ?Past Surgical History:  ?Procedure Laterality Date  ? ABDOMINAL HYSTERECTOMY    ? ANKLE FRACTURE SURGERY Left 1996  ? BACK SURGERY    ? BREAST BIOPSY Left 2010  ? negative  ? CATARACT EXTRACTION W/PHACO Left 10/05/2018  ? Procedure: CATARACT EXTRACTION PHACO AND INTRAOCULAR LENS PLACEMENT (IOC);  Surgeon: Birder Robson, MD;  Location: [facility] ORS;  Service: Ophthalmology;  Laterality: Left;  [place] 00:40 ?CDE 6.92 ?fluid pack lot # [id number] ? ?   ? CATARACT EXTRACTION W/PHACO Right 10/26/2018  ? Procedure: CATARACT EXTRACTION PHACO AND INTRAOCULAR LENS PLACEMENT (IOC);  Surgeon: Birder Robson, MD;  Location: [facility] ORS;  Service: Ophthalmology;  Laterality: Right;  [place]  00:47 ?CDE 7.11 ?Fluid pack lot # [id number]  ? DEXA  11/09/06  ? normal  ? FRACTURE SURGERY    ? Meningitis  1963  ? SPINE SURGERY    ? SYNOVECTOMY Right 1973  ? elbow  ? TONSILLECTOMY AND ADENOIDECTOMY    ? ? ?Family History  ?Problem Relation Age of Onset  ? Schizophrenia Mother   ?     paranoid  ? Heart disease Mother   ?     from psych meds  ? Arthritis Mother   ? Rheum arthritis Mother   ? Cancer Father   ?     prostate  ? Arthritis Father   ? Diabetes Father   ? Ovarian cancer Maternal Grandmother   ? Uterine cancer Maternal Grandmother   ? Colon cancer Maternal Grandmother   ? Breast cancer Paternal Aunt 28  ? ? ?Social History  ? ?Socioeconomic History  ? Marital status: Married  ?  Spouse name: Not on file  ? Number of children: 1  ? Years of education: Not on file  ? Highest education level: Not on file  ?Occupational History  ? Occupation: Scientist, physiological and college professor  ?  Comment: [employer]--PhD in Special education  ?Tobacco Use  ? Smoking status: Never  ?  Smokeless tobacco: Never  ?Vaping Use  ? Vaping Use: Never used  ?Substance and Sexual Activity  ? Alcohol use: Yes  ?  Comment: OCCAS  ? Drug use: No  ? Sexual activity: Not on file  ?Other Topics Concern  ? Not on file  ?Social History Narrative  ? Retired 2007   ? 1 son in [place]  area  ? Spends part time in [place]  ?   ? Has living will  ? Husband then son is health care POA  ? Not sure about DNR---will keep open resuscitation for now   ? No tube feedings if cognitively unaware  ?   ?   ?   ?   ? ?Social Determinants of Health  ? ?Financial Resource Strain: Not on file  ?Food Insecurity: Not on file  ?Transportation Needs: Not on file  ?Physical Activity: Not on file  ?Stress: Not on file  ?Social Connections: Not on file  ?Intimate Partner Violence: Not on file  ? ?Review of Systems ?No fever ?No cough now ?No SOB ?Appetite down some--but eating okay ?Has lost some weight--but not very much ?Not sleeping well---"up all night long" ?Some blurry vision in left eye---and not clear on right either ?Eyelids feel heavy but no clear ptosis ?   ?Objective:  ? Physical Exam ?Constitutional:   ?   Appearance: Normal appearance.  ?Neurological:  ?   Mental Status: She is alert.  ?   Comments: No facial asymmetry ?Seems to have weakness in arms and legs---seems more proximal than distal ?  ?  ? ? ? ? ?   ?Assessment & Plan:  ? ?

## 2022-04-28 NOTE — Assessment & Plan Note (Addendum)
MRI of brain does not give any answers ?Clear serious decline in strength, trouble holding her head up ?No facial symptoms or dysphagia ?Can't stand easily ?No true ptosis or diplopia ? ?Unclear diagnosis ?Will ask for emergent neurology consultation ?Check serology for myasthenia gravis ?Referral for PT/OT ?

## 2022-04-28 NOTE — Assessment & Plan Note (Signed)
Has redness between the buttocks with a narrow slit like ulcer ?No inflammation ? ?Discussed using zinc oxide in thick layer for this ?

## 2022-04-29 ENCOUNTER — Encounter: Payer: Self-pay | Admitting: Neurology

## 2022-04-29 ENCOUNTER — Telehealth: Payer: Self-pay | Admitting: Internal Medicine

## 2022-04-29 NOTE — Telephone Encounter (Signed)
Pts daughter called to schedule pt an appt and she said that pt is having naseau, constipation, and urinary frequncy and dark tarry stools, and its been going on since last night. I transferred pt to nurse triage because of the dark stools ?

## 2022-04-29 NOTE — Telephone Encounter (Addendum)
Morey Hummingbird RN with Access nurse called; pt had black tarry stool last night; earlier this morning had normal colored BM, but still has abd pressure (not pain). Pthas felt nauseated but no vomiting. Pt has had urinary frequency for some time. Pt has not taken Pepto Bismol. Access disposition was ED but pt refused. I called pt and offered appt with Dr Silvio Pate this morning; she said she just had a large BM that was not tarry in color, no blood seen and abd pressure feeling is gone. Pt said she already has FU appt scheduled with Dr Silvio Pate on 05/06/22 and pt will discuss longtime frequency of urine at that time. Pt will cb if needed. Nothing further needed at this time and pt was appreciative of call. Sending note to Dr Silvio Pate and Larene Beach CMA. Will attach access nurse note when available. ? ? ? ?[facility] - Client ?TELEPHONE ADVICE RECORD ?AccessNurse? ?Patient ?Name: ?Jennifer Schmitt ?RIGHT ?Gender: Female ?DOB: 01-09-1935 ?Age: 87 Y 74 M 23 D ?Return ?Phone ?Number: ?[id number] ?(Primary) ?Address: ?City/ ?State/ ?Zip: ?[place] ? [address] ?Client [facility] - Client ?Client Site [facility] ?Provider [name]- MD ?Contact Type Call ?Who Is Calling Patient / Member / Family / Caregiver ?Call Type Triage / Clinical ?Relationship To Patient Self ?Return Phone Number [phone] (Primary) ?Chief Complaint Nausea ?Reason for Call Symptomatic / Request for Health Information ?Initial Comment Caller states that nausea, dark tarry stools last ?night, constipation and urinary frequency. ?Translation No ?Nurse Assessment ?Nurse: Rolin Barry RN, Levada Dy Date/Time Eilene Ghazi Time): 04/29/2022 10:20:04 AM ?Confirm and document reason for call. If ?symptomatic, describe symptoms. ?---Caller states that nausea, dark tarry stools last night, ?constipation and urinary frequency for a long time. No ?vomiting. No temp. ?Does the patient have any new or  worsening ?symptoms? ---Yes ?Will a triage be completed? ---Yes ?Related visit to physician within the last 2 weeks? ---No ?Does the PT have any chronic conditions? (i.e. ?diabetes, asthma, this includes High risk factors for ?pregnancy, etc.) ?---Yes ?List chronic conditions. ---degenerative arthritis ?Is this a behavioral health or substance abuse call? ---No ?Guidelines ?Guideline Title Affirmed Question Affirmed Notes Nurse Date/Time (Eastern ?Time) ?Rectal Bleeding Black or tarry ?bowel movements ?(Exception: chronicunchanged black-grey ?bowel movements ?AND is taking ?iron pills or PeptoBismol) ?Rolin Barry, RN, Levada Dy 04/29/2022 10:22:39 ?AM ?Disp. Time (Eastern ?Time) Disposition Final User ?PLEASE NOTE: All timestamps contained within this report are represented as [place] Standard Time. ?CONFIDENTIALTY NOTICE: This fax transmission is intended only for the addressee. It contains information that is legally privileged, confidential or ?otherwise protected from use or disclosure. If you are not the intended recipient, you are strictly prohibited from reviewing, disclosing, copying using ?or disseminating any of this information or taking any action in reliance on or regarding this information. If you have received this fax in error, please ?notify us immediately by telephone so that we can arrange for its return to [place]. Phone: [phone], Toll-Free: [phone], Fax: [phone] ?Page: 2 of 2 ?Call Id: [id number] ?04/29/2022 10:30:02 AM Go to ED Now Yes De[place], RN, Levada Dy ?Caller Disagree/Comply Disagree ?Caller Understands No ?PreDisposition Did not know what to do ?Care Advice Given Per Guideline ?GO TO ED NOW: * You need to be seen in the Emergency Department. * Go to the ED at ___________ [place] now. ?Drive carefully. BRING MEDICINES: CARE ADVICE given per Rectal Bleeding (Adult) guideline. ?Comments ?User: Saverio Danker, RN Date/Time Eilene Ghazi Time): 04/29/2022 10:35:29  AM ?Caller advised that she had  black stool last night, abdominal pressure and nausea. No black stools this am, but last ?night and still having abdominal pressure that is constant. Caller refuses the ED at this time. Talked with Ann Bohne, ?nurse at the [facility] and she is going to call the patient. ?Referrals ?GO TO FACILITY REFUSE ?

## 2022-04-29 NOTE — Telephone Encounter (Signed)
Okay ?It sounds like she can wait ?

## 2022-04-30 DIAGNOSIS — Z741 Need for assistance with personal care: Secondary | ICD-10-CM | POA: Diagnosis not present

## 2022-04-30 DIAGNOSIS — G7001 Myasthenia gravis with (acute) exacerbation: Secondary | ICD-10-CM | POA: Diagnosis not present

## 2022-04-30 DIAGNOSIS — G5702 Lesion of sciatic nerve, left lower limb: Secondary | ICD-10-CM | POA: Diagnosis not present

## 2022-04-30 DIAGNOSIS — M6281 Muscle weakness (generalized): Secondary | ICD-10-CM | POA: Diagnosis not present

## 2022-04-30 DIAGNOSIS — R262 Difficulty in walking, not elsewhere classified: Secondary | ICD-10-CM | POA: Diagnosis not present

## 2022-04-30 DIAGNOSIS — R278 Other lack of coordination: Secondary | ICD-10-CM | POA: Diagnosis not present

## 2022-04-30 DIAGNOSIS — R29898 Other symptoms and signs involving the musculoskeletal system: Secondary | ICD-10-CM | POA: Diagnosis not present

## 2022-04-30 DIAGNOSIS — R4189 Other symptoms and signs involving cognitive functions and awareness: Secondary | ICD-10-CM | POA: Diagnosis not present

## 2022-04-30 DIAGNOSIS — R2681 Unsteadiness on feet: Secondary | ICD-10-CM | POA: Diagnosis not present

## 2022-05-01 ENCOUNTER — Other Ambulatory Visit: Payer: Self-pay | Admitting: Internal Medicine

## 2022-05-01 LAB — REFLEX TEST INFORMATION

## 2022-05-01 LAB — ACHR ABS WITH REFLEX TO MUSK: AChR Binding Ab, Serum: 28.1 nmol/L — ABNORMAL HIGH (ref 0.00–0.24)

## 2022-05-01 MED ORDER — PYRIDOSTIGMINE BROMIDE 30 MG PO TABS: 1.0000 | ORAL_TABLET | Freq: Three times a day (TID) | ORAL | 3 refills | Status: DC

## 2022-05-01 NOTE — Telephone Encounter (Addendum)
Pt calling and since the large BM pt had on 04/29/22 pt has not had another BM. Pt wants to know what Dr Silvio Pate would recommend for constipation. Pt said she does not normally take med for constipation. Advised pt to drink more water. Pt does not have fever, no abd pain or pressure. No blood seen. Pt has passed gas and is slightly nauseated. Pt said she called the [facility] nurse this morning and the nurse sent EMTs to eval and they could find no problem. Pt still has appt on 05/06/22  at 8:30 with Dr Silvio Pate. UC & ED precautions given and pt voiced understanding.Sending note to Dr Silvio Pate and Larene Beach CMA. Pt request cb today after Dr Silvio Pate reviews note. [facility] ?

## 2022-05-01 NOTE — Telephone Encounter (Signed)
Spoke to pt. She had spoken to Dr Silvio Pate already. ?

## 2022-05-02 DIAGNOSIS — R2681 Unsteadiness on feet: Secondary | ICD-10-CM | POA: Diagnosis not present

## 2022-05-02 DIAGNOSIS — G7001 Myasthenia gravis with (acute) exacerbation: Secondary | ICD-10-CM | POA: Diagnosis not present

## 2022-05-02 DIAGNOSIS — R278 Other lack of coordination: Secondary | ICD-10-CM | POA: Diagnosis not present

## 2022-05-02 DIAGNOSIS — R29898 Other symptoms and signs involving the musculoskeletal system: Secondary | ICD-10-CM | POA: Diagnosis not present

## 2022-05-02 DIAGNOSIS — M6281 Muscle weakness (generalized): Secondary | ICD-10-CM | POA: Diagnosis not present

## 2022-05-02 DIAGNOSIS — R262 Difficulty in walking, not elsewhere classified: Secondary | ICD-10-CM | POA: Diagnosis not present

## 2022-05-02 DIAGNOSIS — R4189 Other symptoms and signs involving cognitive functions and awareness: Secondary | ICD-10-CM | POA: Diagnosis not present

## 2022-05-02 DIAGNOSIS — Z741 Need for assistance with personal care: Secondary | ICD-10-CM | POA: Diagnosis not present

## 2022-05-02 DIAGNOSIS — G5702 Lesion of sciatic nerve, left lower limb: Secondary | ICD-10-CM | POA: Diagnosis not present

## 2022-05-03 ENCOUNTER — Other Ambulatory Visit: Payer: Self-pay | Admitting: Internal Medicine

## 2022-05-04 ENCOUNTER — Encounter (INDEPENDENT_AMBULATORY_CARE_PROVIDER_SITE_OTHER): Payer: Self-pay | Admitting: Vascular Surgery

## 2022-05-04 DIAGNOSIS — H539 Unspecified visual disturbance: Secondary | ICD-10-CM | POA: Insufficient documentation

## 2022-05-04 NOTE — Progress Notes (Signed)
? ? ? ? ?MRN : [id number] ? ?Jennifer Schmitt is a 87 y.o. (July 26, 1935) female who presents with chief complaint of changes in vision. ? ?History of Present Illness:  ? ? ? ?Location: left eye ?Character/quality of the symptom:  disturbance in acuity ?Severity:  moderate ?Duration:  seems to be improving  ?Timing/onset:  abrupt onset ?Aggravating/context:  none ?Relieving/modifying:  none ? ?She was seen by Opthalmology and  inflammatory markers were check which were elevated.  She was started on Prednisone with some improvement. ? ?She has an extensive history of rheumatologic troubles in the past ? ?Current Meds  ?Medication Sig  ? acetaminophen (TYLENOL) 500 MG tablet Take 1,000 mg by mouth as needed.  ? Cholecalciferol (VITAMIN D) 2000 units CAPS Take 2,000 Units by mouth daily.  ? cyanocobalamin (,VITAMIN B-12,) 1000 MCG/ML injection INJECT 1 ML MONTHLY AS DIRECTED  ? diltiazem (CARDIZEM) 30 MG tablet Take 1 tablet (30 mg total) by mouth 3 (three) times daily as needed (atrial fibrillation lasting more than 2 hours).  ? fluticasone (FLONASE) 50 MCG/ACT nasal spray Place 1 spray into both nostrils daily.  ? folic acid (FOLVITE) 1 MG tablet Take 1 tablet (1 mg total) by mouth daily.  ? hydrocortisone 2.5 % cream Apply topically 3 (three) times daily as needed.  ? levocetirizine (XYZAL) 5 MG tablet Take 5 mg by mouth every evening.  ? LORazepam (ATIVAN) 0.5 MG tablet TAKE ONE HALF TO 1 TABLET BY MOUTH TWICEDAILY AS NEEDED FOR ANXIETY  ? metoprolol succinate (TOPROL-XL) 50 MG 24 hr tablet Take 1 tablet (50 mg total) by mouth daily.  ? predniSONE (DELTASONE) 20 MG tablet Take 60 mg by mouth daily.  ? propranolol (INDERAL) 10 MG tablet Take 1 tablet (10 mg total) by mouth 3 (three) times daily as needed (atrial fibrillation lasting more than 2 hours).  ? rivaroxaban (XARELTO) 20 MG TABS tablet TAKE 1 TABLET BY MOUTH EVERY DAY WITH THE EVENING MEAL  ? rosuvastatin (CRESTOR) 5 MG tablet Take 1 tablet (5 mg total) by  mouth daily.  ? Trolamine Salicylate (ASPERCREME EX) Apply topically as needed.  ? valsartan (DIOVAN) 80 MG tablet TAKE 1 TABLET(80 MG) BY MOUTH DAILY  ? ? ?Past Medical History:  ?Diagnosis Date  ? Adenomatous colon polyp   ? Allergic rhinitis due to pollen   ? Anemia   ? Anxiety   ? Bronchiectasis ([place])   ? Complication of anesthesia   ? Degenerative disc disease   ? cervical and lumbar  ? Dysrhythmia   ? GERD (gastroesophageal reflux disease)   ? Hearing loss in right ear   ? Heart murmur   ? HOH (hard of hearing)   ? AIDS  ? Hyperlipidemia   ? Hypertension   ? Osteoarthritis, multiple sites   ? PAF (paroxysmal atrial fibrillation) ([place])   ? a. 01/2013 Echo [facility]): EF 60-65%, no rwma, mildly dil LA/RA, mild AI/TR, trace MR, nl RV size/fxn, mild PAH; b. CHA2DS2VASc = 4-->Chronic Xarelto.  ? PMR (polymyalgia rheumatica) (HCC)   ? PONV (postoperative nausea and vomiting)   ? Rheumatic fever   ? Sleep apnea   ? NO CPAP  ? Urge incontinence   ? Vertigo   ? Vertigo   ? ? ?Past Surgical History:  ?Procedure Laterality Date  ? ABDOMINAL HYSTERECTOMY    ? ANKLE FRACTURE SURGERY Left 1996  ? BACK SURGERY    ? BREAST BIOPSY Left 2010  ? negative  ? CATARACT EXTRACTION W/PHACO  Left 10/05/2018  ? Procedure: CATARACT EXTRACTION PHACO AND INTRAOCULAR LENS PLACEMENT (IOC);  Surgeon: Birder Robson, MD;  Location: [facility] ORS;  Service: Ophthalmology;  Laterality: Left;  [place] 00:40 ?CDE 6.92 ?fluid pack lot # [id number] ? ?   ? CATARACT EXTRACTION W/PHACO Right 10/26/2018  ? Procedure: CATARACT EXTRACTION PHACO AND INTRAOCULAR LENS PLACEMENT (IOC);  Surgeon: Birder Robson, MD;  Location: [facility] ORS;  Service: Ophthalmology;  Laterality: Right;  [place]  00:47 ?CDE 7.11 ?Fluid pack lot # [id number]  ? DEXA  11/09/06  ? normal  ? FRACTURE SURGERY    ? Meningitis  1963  ? SPINE SURGERY    ? SYNOVECTOMY Right 1973  ? elbow  ? TONSILLECTOMY AND ADENOIDECTOMY    ? ? ?Social History ?Social History  ? ?Tobacco Use  ? Smoking status:  Never  ? Smokeless tobacco: Never  ?Vaping Use  ? Vaping Use: Never used  ?Substance Use Topics  ? Alcohol use: Yes  ?  Comment: OCCAS  ? Drug use: No  ? ? ?Family History ?Family History  ?Problem Relation Age of Onset  ? Schizophrenia Mother   ?     paranoid  ? Heart disease Mother   ?     from psych meds  ? Arthritis Mother   ? Rheum arthritis Mother   ? Cancer Father   ?     prostate  ? Arthritis Father   ? Diabetes Father   ? Ovarian cancer Maternal Grandmother   ? Uterine cancer Maternal Grandmother   ? Colon cancer Maternal Grandmother   ? Breast cancer Paternal Aunt 77  ? ? ?Allergies  ?Allergen Reactions  ? Actonel [Risedronate Sodium] Nausea And Vomiting  ? Boniva [Ibandronic Acid] Nausea And Vomiting  ? Diclofenac Other (See Comments)  ?  Voltaren Gel aggravated her sinus passages  ? Fosamax [Alendronate Sodium] Nausea And Vomiting  ? Lipitor [Atorvastatin] Other (See Comments)  ?  Muscle aches. Tolerates Crestor.  ? Miacalcin [Calcitonin ([place])] Other (See Comments)  ?  Sneezing   ? Myrbetriq [Mirabegron] Other (See Comments)  ?  Gastritis  ? Talwin [Pentazocine] Other (See Comments)  ?  Hallucinations   ? Latex Rash  ?  Sneezing, watery eyes.  ? Niaspan [Niacin Er] Swelling and Rash  ? ? ? ?REVIEW OF SYSTEMS (Negative unless checked) ? ?Constitutional: '[]'$ Weight loss  '[]'$ Fever  '[]'$ Chills ?Cardiac: '[]'$ Chest pain   '[]'$ Chest pressure   '[]'$ Palpitations   '[]'$ Shortness of breath when laying flat   '[]'$ Shortness of breath with exertion. ?Vascular:  '[x]'$ Pain in legs with walking   '[]'$ Pain in legs at rest  '[]'$ History of DVT   '[]'$ Phlebitis   '[]'$ Swelling in legs   '[]'$ Varicose veins   '[]'$ Non-healing ulcers ?Pulmonary:   '[]'$ Uses home oxygen   '[]'$ Productive cough   '[]'$ Hemoptysis   '[]'$ Wheeze  '[]'$ COPD   '[]'$ Asthma ?Neurologic:  '[]'$ Dizziness   '[]'$ Seizures   '[]'$ History of stroke   '[]'$ History of TIA  '[]'$ Aphasia   '[]'$ Vissual changes   '[]'$ Weakness or numbness in arm   '[]'$ Weakness or numbness in leg ?Musculoskeletal:   '[]'$ Joint swelling   '[x]'$ Joint pain    '[x]'$ Low back pain ?Hematologic:  '[]'$ Easy bruising  '[]'$ Easy bleeding   '[]'$ Hypercoagulable state   '[]'$ Anemic ?Gastrointestinal:  '[]'$ Diarrhea   '[]'$ Vomiting  '[x]'$ Gastroesophageal reflux/heartburn   '[]'$ Difficulty swallowing. ?Genitourinary:  '[]'$ Chronic kidney disease   '[]'$ Difficult urination  '[]'$ Frequent urination   '[]'$ Blood in urine ?Skin:  '[]'$ Rashes   '[]'$ Ulcers  ?Psychological:  '[]'$ History of anxiety   '[]'$  History of  major depression. ? ?Physical Examination ? ?Vitals:  ? 04/28/22 1513  ?BP: (!) 147/77  ?Pulse: 76  ?Resp: 17  ? ?There is no height or weight on file to calculate BMI. ?Gen: WD/WN, NAD ?Head: [place]/AT, No temporalis wasting.  ?Ear/Nose/Throat: Hearing grossly intact, nares w/o erythema or drainage ?Eyes: PER, EOMI, sclera nonicteric.  ?Neck: Supple, no masses.  No bruit or JVD.  ?Pulmonary:  Good air movement, no audible wheezing, no use of accessory muscles.  ?Cardiac: RRR, normal S1, S2, no Murmurs. ?Vascular:   ?Vessel Right Left  ?Radial Palpable Palpable  ?astrointestinal: soft, non-distended. No guarding/no peritoneal signs.  ?Musculoskeletal: M/S 5/5 throughout.  No visible deformity.  ?Neurologic: CN 2-12 intact. Pain and light touch intact in extremities.  Symmetrical.  Speech is fluent. Motor exam as listed above. ?Psychiatric: Judgment intact, Mood & affect appropriate for pt's clinical situation. ?Dermatologic: No rashes or ulcers noted.  No changes consistent with cellulitis. ? ? ?CBC ?Lab Results  ?Component Value Date  ? WBC 5.7 03/04/2022  ? HGB 12.6 03/04/2022  ? HCT 37.3 03/04/2022  ? MCV 97.3 03/04/2022  ? PLT 309 04/23/2022  ? ? ?BMET ?   ?Component Value Date/Time  ? NA 139 03/04/2022 1221  ? K 4.5 03/04/2022 1221  ? CL 101 03/04/2022 1221  ? CO2 32 03/04/2022 1221  ? GLUCOSE 94 03/04/2022 1221  ? BUN 15 03/04/2022 1221  ? CREATININE 0.71 03/04/2022 1221  ? CALCIUM 9.4 03/04/2022 1221  ? GFRNONAA >60 05/31/2015 1055  ? GFRAA >60 05/31/2015 1055  ? ?CrCl cannot be calculated (Patient's most recent lab  result is older than the maximum 21 days allowed.). ? ?COAG ?No results found for: INR, PROTIME ? ?Radiology ?MR Brain W Wo Contrast ? ?Result Date: 04/27/2022 ?CLINICAL DATA:  Neuro deficit, persistent/recurr

## 2022-05-05 DIAGNOSIS — R2681 Unsteadiness on feet: Secondary | ICD-10-CM | POA: Diagnosis not present

## 2022-05-05 DIAGNOSIS — R278 Other lack of coordination: Secondary | ICD-10-CM | POA: Diagnosis not present

## 2022-05-05 DIAGNOSIS — R4189 Other symptoms and signs involving cognitive functions and awareness: Secondary | ICD-10-CM | POA: Diagnosis not present

## 2022-05-05 DIAGNOSIS — R29898 Other symptoms and signs involving the musculoskeletal system: Secondary | ICD-10-CM | POA: Diagnosis not present

## 2022-05-05 DIAGNOSIS — G7001 Myasthenia gravis with (acute) exacerbation: Secondary | ICD-10-CM | POA: Diagnosis not present

## 2022-05-05 DIAGNOSIS — R262 Difficulty in walking, not elsewhere classified: Secondary | ICD-10-CM | POA: Diagnosis not present

## 2022-05-05 DIAGNOSIS — G5702 Lesion of sciatic nerve, left lower limb: Secondary | ICD-10-CM | POA: Diagnosis not present

## 2022-05-05 DIAGNOSIS — M6281 Muscle weakness (generalized): Secondary | ICD-10-CM | POA: Diagnosis not present

## 2022-05-05 DIAGNOSIS — Z741 Need for assistance with personal care: Secondary | ICD-10-CM | POA: Diagnosis not present

## 2022-05-05 NOTE — Telephone Encounter (Signed)
Last filled 04-07-22 #30 ?Last OV 04-28-22 ?Next OV 05-06-22 ?[place] ?

## 2022-05-06 ENCOUNTER — Encounter: Payer: Self-pay | Admitting: Internal Medicine

## 2022-05-06 ENCOUNTER — Ambulatory Visit: Payer: Medicare PPO | Admitting: Internal Medicine

## 2022-05-06 DIAGNOSIS — G7000 Myasthenia gravis without (acute) exacerbation: Secondary | ICD-10-CM

## 2022-05-06 MED ORDER — HYOSCYAMINE SULFATE 0.125 MG SL SUBL: 0.1250 mg | SUBLINGUAL_TABLET | Freq: Three times a day (TID) | SUBLINGUAL | 1 refills | Status: DC

## 2022-05-06 NOTE — Patient Instructions (Signed)
Please try the new medication hyoscyamine under your tongue before meals---and try the pyridostigmine again with meals. ? ?Take miralax 1 capful in a full glass of liquid daily and also senna-s ---2 tabs daily (both over the counter). ?

## 2022-05-06 NOTE — Assessment & Plan Note (Signed)
Presentation and titer are fairly diagnostic ?Wasn't able to tolerate the pyridostigmine after 1 dose ?Will add hyoscyamine 0.125 before meals--then the pyridostigmine with meals ? ?May need to add prednisone burst for symptoms if unable to take the pyridostigmine still ?Seeing Dr Tomi Likens in 2 days ?

## 2022-05-06 NOTE — Progress Notes (Signed)
? ?Subjective:  ? ? Patient ID: Jennifer Schmitt, female    DOB: 1935-05-06, 87 y.o.   MRN: [id number] ? ?HPI ?Here for follow up of multiple issues---and new tentative myasthenia gravis diagnosis ? ?Only took 1 dose of pyridostigmine---took with lunch ?Developed nausea ---very severe ?Has not taken another dose since then ? ?Current Outpatient Medications on File Prior to Visit  ?Medication Sig Dispense Refill  ? acetaminophen (TYLENOL) 500 MG tablet Take 1,000 mg by mouth as needed.    ? Cholecalciferol (VITAMIN D) 2000 units CAPS Take 2,000 Units by mouth daily.    ? cyanocobalamin (,VITAMIN B-12,) 1000 MCG/ML injection INJECT 1 ML MONTHLY AS DIRECTED 10 mL 1  ? diltiazem (CARDIZEM) 30 MG tablet Take 1 tablet (30 mg total) by mouth 3 (three) times daily as needed (atrial fibrillation lasting more than 2 hours). 60 tablet 3  ? dimenhyDRINATE (DRAMAMINE PO) Take by mouth.    ? estradiol (ESTRACE) 0.1 MG/GM vaginal cream Place 1 Applicatorful vaginally 3 (three) times a week. 42.5 g 12  ? fluticasone (FLONASE) 50 MCG/ACT nasal spray Place 1 spray into both nostrils daily.    ? folic acid (FOLVITE) 1 MG tablet Take 1 tablet (1 mg total) by mouth daily. 90 tablet 3  ? hydrocortisone 2.5 % cream Apply topically 3 (three) times daily as needed. 28 g 3  ? levocetirizine (XYZAL) 5 MG tablet Take 5 mg by mouth every evening.    ? LORazepam (ATIVAN) 0.5 MG tablet TAKE ONE HALF TO 1 TABLET BY MOUTH TWICEDAILY AS NEEDED FOR ANXIETY 30 tablet 0  ? metoprolol succinate (TOPROL-XL) 50 MG 24 hr tablet Take 1 tablet (50 mg total) by mouth daily. 30 tablet 3  ? propranolol (INDERAL) 10 MG tablet Take 1 tablet (10 mg total) by mouth 3 (three) times daily as needed (atrial fibrillation lasting more than 2 hours). 60 tablet 6  ? Pyridostigmine Bromide 30 MG TABS Take 1 tablet by mouth in the morning, at noon, and at bedtime. 90 tablet 3  ? rivaroxaban (XARELTO) 20 MG TABS tablet TAKE 1 TABLET BY MOUTH EVERY DAY WITH THE EVENING MEAL 90  tablet 1  ? rosuvastatin (CRESTOR) 5 MG tablet Take 1 tablet (5 mg total) by mouth daily. 90 tablet 3  ? Trolamine Salicylate (ASPERCREME EX) Apply topically as needed.    ? valsartan (DIOVAN) 80 MG tablet TAKE 1 TABLET(80 MG) BY MOUTH DAILY 90 tablet 3  ? predniSONE (DELTASONE) 20 MG tablet Take 60 mg by mouth daily.    ? ?No current facility-administered medications on file prior to visit.  ? ? ?Allergies  ?Allergen Reactions  ? Actonel [Risedronate Sodium] Nausea And Vomiting  ? Boniva [Ibandronic Acid] Nausea And Vomiting  ? Diclofenac Other (See Comments)  ?  Voltaren Gel aggravated her sinus passages  ? Fosamax [Alendronate Sodium] Nausea And Vomiting  ? Lipitor [Atorvastatin] Other (See Comments)  ?  Muscle aches. Tolerates Crestor.  ? Miacalcin [Calcitonin ([place])] Other (See Comments)  ?  Sneezing   ? Myrbetriq [Mirabegron] Other (See Comments)  ?  Gastritis  ? Talwin [Pentazocine] Other (See Comments)  ?  Hallucinations   ? Latex Rash  ?  Sneezing, watery eyes.  ? Niaspan [Niacin Er] Swelling and Rash  ? ? ?Past Medical History:  ?Diagnosis Date  ? Adenomatous colon polyp   ? Allergic rhinitis due to pollen   ? Anemia   ? Anxiety   ? Bronchiectasis ([place])   ? Complication of  anesthesia   ? Degenerative disc disease   ? cervical and lumbar  ? Dysrhythmia   ? GERD (gastroesophageal reflux disease)   ? Hearing loss in right ear   ? Heart murmur   ? HOH (hard of hearing)   ? AIDS  ? Hyperlipidemia   ? Hypertension   ? Osteoarthritis, multiple sites   ? PAF (paroxysmal atrial fibrillation) ([place])   ? a. 01/2013 Echo [facility]): EF 60-65%, no rwma, mildly dil LA/RA, mild AI/TR, trace MR, nl RV size/fxn, mild PAH; b. CHA2DS2VASc = 4-->Chronic Xarelto.  ? PMR (polymyalgia rheumatica) (HCC)   ? PONV (postoperative nausea and vomiting)   ? Rheumatic fever   ? Sleep apnea   ? NO CPAP  ? Urge incontinence   ? Vertigo   ? Vertigo   ? ? ?Past Surgical History:  ?Procedure Laterality Date  ? ABDOMINAL HYSTERECTOMY     ? ANKLE FRACTURE SURGERY Left 1996  ? BACK SURGERY    ? BREAST BIOPSY Left 2010  ? negative  ? CATARACT EXTRACTION W/PHACO Left 10/05/2018  ? Procedure: CATARACT EXTRACTION PHACO AND INTRAOCULAR LENS PLACEMENT (IOC);  Surgeon: Birder Robson, MD;  Location: [facility] ORS;  Service: Ophthalmology;  Laterality: Left;  [place] 00:40 ?CDE 6.92 ?fluid pack lot # [id number] H ? ?   ? CATARACT EXTRACTION W/PHACO Right 10/26/2018  ? Procedure: CATARACT EXTRACTION PHACO AND INTRAOCULAR LENS PLACEMENT (IOC);  Surgeon: Birder Robson, MD;  Location: [facility] ORS;  Service: Ophthalmology;  Laterality: Right;  [place]  00:47 ?CDE 7.11 ?Fluid pack lot # [id number]  ? DEXA  11/09/06  ? normal  ? FRACTURE SURGERY    ? Meningitis  1963  ? SPINE SURGERY    ? SYNOVECTOMY Right 1973  ? elbow  ? TONSILLECTOMY AND ADENOIDECTOMY    ? ? ?Family History  ?Problem Relation Age of Onset  ? Schizophrenia Mother   ?     paranoid  ? Heart disease Mother   ?     from psych meds  ? Arthritis Mother   ? Rheum arthritis Mother   ? Cancer Father   ?     prostate  ? Arthritis Father   ? Diabetes Father   ? Ovarian cancer Maternal Grandmother   ? Uterine cancer Maternal Grandmother   ? Colon cancer Maternal Grandmother   ? Breast cancer Paternal Aunt 81  ? ? ?Social History  ? ?Socioeconomic History  ? Marital status: Married  ?  Spouse name: Not on file  ? Number of children: 1  ? Years of education: Not on file  ? Highest education level: Not on file  ?Occupational History  ? Occupation: Scientist, physiological and college professor  ?  Comment: [employer]--PhD in Special education  ?Tobacco Use  ? Smoking status: Never  ? Smokeless tobacco: Never  ?Vaping Use  ? Vaping Use: Never used  ?Substance and Sexual Activity  ? Alcohol use: Yes  ?  Comment: OCCAS  ? Drug use: No  ? Sexual activity: Not on file  ?Other Topics Concern  ? Not on file  ?Social History Narrative  ? Retired 2007   ? 1 son in [place] area  ? Spends part time in [place]  ?   ? Has living will  ? Husband  then son is health care POA  ? Not sure about DNR---will keep open resuscitation for now   ? No tube feedings if cognitively unaware  ?   ?   ?   ?   ? ?  Social Determinants of Health  ? ?Financial Resource Strain: Not on file  ?Food Insecurity: Not on file  ?Transportation Needs: Not on file  ?Physical Activity: Not on file  ?Stress: Not on file  ?Social Connections: Not on file  ?Intimate Partner Violence: Not on file  ? ?Review of Systems ?Some constipation--may have some relief with the miralax ?Some nausea again yesterday---relates to sinus drainage?? (Dramamine does help some) ? ?   ?Objective:  ? Physical Exam ?Constitutional:   ?   Comments: Still in wheelchair  ?Neurological:  ?   Mental Status: She is alert.  ?   Comments: Ongoing neck and extremity weakness  ?  ? ? ? ? ?   ?Assessment & Plan:  ? ?

## 2022-05-07 DIAGNOSIS — R262 Difficulty in walking, not elsewhere classified: Secondary | ICD-10-CM | POA: Diagnosis not present

## 2022-05-07 DIAGNOSIS — M6281 Muscle weakness (generalized): Secondary | ICD-10-CM | POA: Diagnosis not present

## 2022-05-07 DIAGNOSIS — G5702 Lesion of sciatic nerve, left lower limb: Secondary | ICD-10-CM | POA: Diagnosis not present

## 2022-05-07 DIAGNOSIS — Z741 Need for assistance with personal care: Secondary | ICD-10-CM | POA: Diagnosis not present

## 2022-05-07 DIAGNOSIS — R2681 Unsteadiness on feet: Secondary | ICD-10-CM | POA: Diagnosis not present

## 2022-05-07 DIAGNOSIS — R4189 Other symptoms and signs involving cognitive functions and awareness: Secondary | ICD-10-CM | POA: Diagnosis not present

## 2022-05-07 DIAGNOSIS — R29898 Other symptoms and signs involving the musculoskeletal system: Secondary | ICD-10-CM | POA: Diagnosis not present

## 2022-05-07 DIAGNOSIS — G7001 Myasthenia gravis with (acute) exacerbation: Secondary | ICD-10-CM | POA: Diagnosis not present

## 2022-05-07 DIAGNOSIS — R278 Other lack of coordination: Secondary | ICD-10-CM | POA: Diagnosis not present

## 2022-05-07 DIAGNOSIS — M3501 Sicca syndrome with keratoconjunctivitis: Secondary | ICD-10-CM | POA: Diagnosis not present

## 2022-05-07 NOTE — Progress Notes (Addendum)
NEUROLOGY CONSULTATION NOTE  Jennifer Schmitt MRN: [id number] DOB: [birth year]  Referring provider: [name], MD Primary care provider: [name], MD  Reason for consult:  newly-diagnosed myasthenia gravis  Assessment/Plan:   Newly diagnosed Seropositive Myasthenia Gravis - has head drop and maybe trace weakness to neck extension otherwise strong and does not appear to be in distress.  Therefore, I don't think she is having a crisis that requires  hospital admission.  Would start prednisone and increase pyridostigmine.  Patient does not have temporal arteritis.  Ptosis secondary to myasthenia gravis.   Start prednisone '20mg'$  daily (hopefully will tolerate much lower dose).  Take with Prevacid '15mg'$  daily and calcium '600mg'$  daily Due to continued head drop, will increase Mestinon to '60mg'$  at 7 AM, 1 PM and 7 PM Discontinue Levsin as it is contraindicated in myasthenia gravis Check CT chest with contrast to evaluate for thymoma Follow up in 4 weeks.   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  History supplemented by primary care notes.   Since January-February 2023, she has had progressive weakness.  She began having trouble rolling over in bed.  She was unable to get up and off her bed so she started sleeping on the couch..  Noted weakness in the left leg.  Seems to be dragging her right leg.  When standing, always with sensation that legs will give out. She has had falls and unable to stand up, requiring to call EMS.  Notes particular weakness in the proximal arms and trouble keeping her head up.  Started relying on a cane more frequently and then wheelchair.  She also endorsed worsening musculoskeletal pain with pain from the neck down her spine to her sacrum.  She does have degenerative spine disease and polymyalgia rheumatica.   MRI of brain with and without contrast on 04/27/2022 personally reviewed showed moderate chronic small vessel ischemic changes and known 1 x 2 mm vestibular schwannoma within the right internal auditory canal, stable compared to prior imaging in 2017.  She started noticing that her left eyelid was drooping.  She went to the ophthalmologist who suspected temporal arteritis given her history of PMR.  No vision loss.  Sed rate and CRP on 04/23/2022 were 101 and 10.1 respectively and was started on prednisone '60mg'$  daily with some improvement.  Based on strong likelihood of giant cell arteritis with elevated inflammatory markers and having PMR, temporal artery biopsy was not pursued.  She was started on prednisone '60mg'$  daily but discontinued after 3 days due to side effects.  She also has had increased urinary urgency and sometimes fecal incontinence.  Due to sacral pain following a fall, she had X-ray of lumbar spine and MRI of sacrum which were negative for occult fracture.  AChR Binding Ab on 04/28/2022 was elevated at 28.10.  PCP started her on pyridostigmine '30mg'$  three times daily.  She notes improvement in strength in the legs.  However, she still has a head drop.  Denies double vision, trouble swallowing, trouble chewing, and trouble taking a breath.  PAST MEDICAL HISTORY: Past Medical History:  Diagnosis Date   Adenomatous colon polyp    Allergic rhinitis due to pollen    Anemia    Anxiety    Bronchiectasis ([place])    Complication of anesthesia    Degenerative disc disease    cervical and lumbar   Dysrhythmia  GERD (gastroesophageal reflux disease)    Hearing loss in right ear    Heart murmur    HOH (hard of hearing)    AIDS   Hyperlipidemia    Hypertension    Osteoarthritis, multiple sites    PAF (paroxysmal atrial fibrillation) ([place])    a. 01/2013 Echo [facility]): EF 60-65%, no rwma, mildly dil LA/RA, mild AI/TR, trace MR, nl RV size/fxn, mild PAH; b. CHA2DS2VASc = 4-->Chronic Xarelto.    PMR (polymyalgia rheumatica) (HCC)    PONV (postoperative nausea and vomiting)    Rheumatic fever    Sleep apnea    NO CPAP   Urge incontinence    Vertigo    Vertigo     PAST SURGICAL HISTORY: Past Surgical History:  Procedure Laterality Date   ABDOMINAL HYSTERECTOMY     ANKLE FRACTURE SURGERY Left 1996   BACK SURGERY     BREAST BIOPSY Left 2010   negative   CATARACT EXTRACTION W/PHACO Left 10/05/2018   Procedure: CATARACT EXTRACTION PHACO AND INTRAOCULAR LENS PLACEMENT ([place]);  Surgeon: Birder Robson, MD;  Location: [facility] ORS;  Service: Ophthalmology;  Laterality: Left;  [place] 00:40 CDE 6.92 fluid pack lot # [id number]      CATARACT EXTRACTION W/PHACO Right 10/26/2018   Procedure: CATARACT EXTRACTION PHACO AND INTRAOCULAR LENS PLACEMENT (IOC);  Surgeon: Birder Robson, MD;  Location: [facility] ORS;  Service: Ophthalmology;  Laterality: Right;  [place]  00:47 CDE 7.11 Fluid pack lot # [id number]   DEXA  11/09/06   normal   FRACTURE SURGERY     Meningitis  1963   SPINE SURGERY     SYNOVECTOMY Right 1973   elbow   TONSILLECTOMY AND ADENOIDECTOMY      MEDICATIONS: Current Outpatient Medications on File Prior to Visit  Medication Sig Dispense Refill   acetaminophen (TYLENOL) 500 MG tablet Take 1,000 mg by mouth as needed.     Cholecalciferol (VITAMIN D) 2000 units CAPS Take 2,000 Units by mouth daily.     cyanocobalamin (,VITAMIN B-12,) 1000 MCG/ML injection INJECT 1 ML MONTHLY AS DIRECTED 10 mL 1   diltiazem (CARDIZEM) 30 MG tablet Take 1 tablet (30 mg total) by mouth 3 (three) times daily as needed (atrial fibrillation lasting more than 2 hours). 60 tablet 3   dimenhyDRINATE (DRAMAMINE PO) Take by mouth.     estradiol (ESTRACE) 0.1 MG/GM vaginal cream Place 1 Applicatorful vaginally 3 (three) times a week. 42.5 g 12   fluticasone (FLONASE) 50 MCG/ACT nasal spray Place 1 spray into both nostrils daily.     folic acid (FOLVITE) 1 MG tablet Take 1 tablet (1 mg total) by mouth daily.  90 tablet 3   hydrocortisone 2.5 % cream Apply topically 3 (three) times daily as needed. 28 g 3   hyoscyamine (LEVSIN SL) 0.125 MG SL tablet Place 1 tablet (0.125 mg total) under the tongue in the morning, at noon, and at bedtime. Before meals and pyridostigmine 90 tablet 1   levocetirizine (XYZAL) 5 MG tablet Take 5 mg by mouth every evening.     LORazepam (ATIVAN) 0.5 MG tablet TAKE ONE HALF TO 1 TABLET BY MOUTH TWICEDAILY AS NEEDED FOR ANXIETY 30 tablet 0   metoprolol succinate (TOPROL-XL) 50 MG 24 hr tablet Take 1 tablet (50 mg total) by mouth daily. 30 tablet 3   predniSONE (DELTASONE) 20 MG tablet Take 60 mg by mouth daily.     propranolol (INDERAL) 10 MG tablet Take 1 tablet (10 mg total) by mouth 3 (three)  times daily as needed (atrial fibrillation lasting more than 2 hours). 60 tablet 6   Pyridostigmine Bromide 30 MG TABS Take 1 tablet by mouth in the morning, at noon, and at bedtime. 90 tablet 3   rivaroxaban (XARELTO) 20 MG TABS tablet TAKE 1 TABLET BY MOUTH EVERY DAY WITH THE EVENING MEAL 90 tablet 1   rosuvastatin (CRESTOR) 5 MG tablet Take 1 tablet (5 mg total) by mouth daily. 90 tablet 3   Trolamine Salicylate (ASPERCREME EX) Apply topically as needed.     valsartan (DIOVAN) 80 MG tablet TAKE 1 TABLET(80 MG) BY MOUTH DAILY 90 tablet 3   No current facility-administered medications on file prior to visit.    ALLERGIES: Allergies  Allergen Reactions   Actonel [Risedronate Sodium] Nausea And Vomiting   Boniva [Ibandronic Acid] Nausea And Vomiting   Diclofenac Other (See Comments)    Voltaren Gel aggravated her sinus passages   Fosamax [Alendronate Sodium] Nausea And Vomiting   Lipitor [Atorvastatin] Other (See Comments)    Muscle aches. Tolerates Crestor.   Miacalcin [Calcitonin ([place])] Other (See Comments)    Sneezing    Myrbetriq [Mirabegron] Other (See Comments)    Gastritis   Talwin [Pentazocine] Other (See Comments)    Hallucinations    Latex Rash    Sneezing,  watery eyes.   Niaspan [Niacin Er] Swelling and Rash    FAMILY HISTORY: Family History  Problem Relation Age of Onset   Schizophrenia Mother        paranoid   Heart disease Mother        from psych meds   Arthritis Mother    Rheum arthritis Mother    Cancer Father        prostate   Arthritis Father    Diabetes Father    Ovarian cancer Maternal Grandmother    Uterine cancer Maternal Grandmother    Colon cancer Maternal Grandmother    Breast cancer Paternal Aunt 45    Objective:  Blood pressure (!) 137/56, pulse (!) 56, height '5\' 4"'$  (1.626 m), weight 159 lb (72.1 kg), SpO2 96 %. General: No acute distress.  Patient appears well-groomed.   Head:  Normocephalic/atraumatic Eyes:  fundi examined but not visualized Neck: supple, no paraspinal tenderness, full range of motion Back: No paraspinal tenderness Heart: regular rate and rhythm Lungs: Clear to auscultation bilaterally. Vascular: No carotid bruits. Neurological Exam: Mental status: alert and oriented to person, place, and time, recent and remote memory intact, fund of knowledge intact, attention and concentration intact, speech fluent and not dysarthric, language intact. Cranial nerves: CN I: not tested CN II: pupils equal, round and reactive to light, visual fields intact CN III, IV, VI:  full range of motion, no nystagmus, left greater than right ptosis, fatigable  CN V: facial sensation intact. CN VII: upper and lower face symmetric.   CN VIII: hearing intact CN IX, X: gag intact, uvula midline CN XI: sternocleidomastoid and trapezius muscles intact CN XII: tongue midline Bulk & Tone: normal, no fasciculations. Motor:   Head drop.  Neck flexors 5/5.  Neck extensors 5-/5 against resistance Upper Extremities: R L Deltoid   5/5 5/5 Biceps   5/5 5/5 Triceps   5/5 5/5 Wrist extensors 5/5 5/5 Wrist flexors  5/5 5/5 Finger extensors 5/5 5/5 Finger flexors  5/5 5/5 Dorsal interossei 5/5 5/5 Abductor pollicis   5/5 5/5   Lower Extremities: R L Hip flexors  5-/5 5/5 Adductors  5/5 5/5 Abductors  5/5 5/5 Knee flexors  5/5 5/5 Knee extensors 5/5 5/5 Dorsiflexors   5/5 5/5 Plantarflexors  5/5 5/5 Toe flexors  5/5 5/5 Toe extensors  5/5 5/5  Sensation:  Pinprick and vibratory sensation reduced in feet Deep Tendon Reflexes:  1+ throughout,  toes downgoing.   Finger to nose testing:  Without dysmetria.   Heel to shin:  Without dysmetria.   Gait:  In wheelchair.  Needs to push up with upper extremities to stand.  Broad-based gait with short stride.  Romberg with sway.    Thank you for allowing me to take part in the care of this patient.  Metta Clines, DO  CC: [name], MD

## 2022-05-08 ENCOUNTER — Ambulatory Visit: Payer: Medicare PPO | Admitting: Neurology

## 2022-05-08 ENCOUNTER — Encounter: Payer: Self-pay | Admitting: Neurology

## 2022-05-08 ENCOUNTER — Telehealth: Payer: Self-pay | Admitting: Internal Medicine

## 2022-05-08 VITALS — BP 137/56 | HR 56 | Ht 64.0 in | Wt 159.0 lb

## 2022-05-08 DIAGNOSIS — G7000 Myasthenia gravis without (acute) exacerbation: Secondary | ICD-10-CM | POA: Diagnosis not present

## 2022-05-08 MED ORDER — LANSOPRAZOLE 15 MG PO CPDR: 15.0000 mg | DELAYED_RELEASE_CAPSULE | Freq: Every day | ORAL | 5 refills | Status: DC

## 2022-05-08 MED ORDER — PYRIDOSTIGMINE BROMIDE 60 MG PO TABS: ORAL_TABLET | ORAL | 5 refills | Status: DC

## 2022-05-08 MED ORDER — PREDNISONE 20 MG PO TABS: 20.0000 mg | ORAL_TABLET | Freq: Every day | ORAL | 5 refills | Status: DC

## 2022-05-08 NOTE — Patient Instructions (Signed)
Start prednisone '20mg'$  daily every morning.  To help protect stomach, take Prevacid '15mg'$  daily Increase pyridostigmine to '60mg'$  at 7 AM, 1 PM and 7 PM Order CT chest with contrast to evaluate for thymoma Follow up in 4 weeks.

## 2022-05-08 NOTE — Telephone Encounter (Signed)
Spoke to [place].

## 2022-05-08 NOTE — Telephone Encounter (Signed)
Jennifer Schmitt from [facility] called on behalf of Dr Merleen Nicely to let DR Silvio Pate know that pt had elevated Sed rate and Crp and he wanted pt referred to rheumatology and pt wanted Dr Silvio Pate to do the referral . Call back is [phone] if there are any questions

## 2022-05-09 ENCOUNTER — Encounter: Payer: Self-pay | Admitting: Neurology

## 2022-05-09 ENCOUNTER — Telehealth: Payer: Self-pay | Admitting: Neurology

## 2022-05-09 ENCOUNTER — Other Ambulatory Visit: Payer: Self-pay | Admitting: Neurology

## 2022-05-09 DIAGNOSIS — R2681 Unsteadiness on feet: Secondary | ICD-10-CM | POA: Diagnosis not present

## 2022-05-09 DIAGNOSIS — Z741 Need for assistance with personal care: Secondary | ICD-10-CM | POA: Diagnosis not present

## 2022-05-09 DIAGNOSIS — G5702 Lesion of sciatic nerve, left lower limb: Secondary | ICD-10-CM | POA: Diagnosis not present

## 2022-05-09 DIAGNOSIS — M6281 Muscle weakness (generalized): Secondary | ICD-10-CM | POA: Diagnosis not present

## 2022-05-09 DIAGNOSIS — G7001 Myasthenia gravis with (acute) exacerbation: Secondary | ICD-10-CM | POA: Diagnosis not present

## 2022-05-09 DIAGNOSIS — R29898 Other symptoms and signs involving the musculoskeletal system: Secondary | ICD-10-CM | POA: Diagnosis not present

## 2022-05-09 DIAGNOSIS — R4189 Other symptoms and signs involving cognitive functions and awareness: Secondary | ICD-10-CM | POA: Diagnosis not present

## 2022-05-09 DIAGNOSIS — R262 Difficulty in walking, not elsewhere classified: Secondary | ICD-10-CM | POA: Diagnosis not present

## 2022-05-09 DIAGNOSIS — R278 Other lack of coordination: Secondary | ICD-10-CM | POA: Diagnosis not present

## 2022-05-09 MED ORDER — CALCIUM CARBONATE 600 MG PO TABS: 600.0000 mg | ORAL_TABLET | Freq: Every day | ORAL | 0 refills | Status: AC

## 2022-05-09 NOTE — Telephone Encounter (Signed)
Spoke with Dr Joya Gaskins and went over her medications the lansoprazole '15mg'$  daily, prednisone '20mg'$  daily and pyridostigmine '60mg'$  three times daily.  In addition, Dr Tomi Likens  would like her to take calcium '600mg'$  daily. Dr Tomi Likens wants her to DISCONTINUE HYOSCYAMINE. Dr Joya Gaskins verbalized understanding

## 2022-05-09 NOTE — Telephone Encounter (Signed)
Pt is wanting to talk to someone about her medication that Dr Tomi Likens gave her. He gave her lansoprazole and prednisone. She is also on Pyridostigmine and hyoscyamine.  Please call patient

## 2022-05-12 DIAGNOSIS — R262 Difficulty in walking, not elsewhere classified: Secondary | ICD-10-CM | POA: Diagnosis not present

## 2022-05-12 DIAGNOSIS — R4189 Other symptoms and signs involving cognitive functions and awareness: Secondary | ICD-10-CM | POA: Diagnosis not present

## 2022-05-12 DIAGNOSIS — G7001 Myasthenia gravis with (acute) exacerbation: Secondary | ICD-10-CM | POA: Diagnosis not present

## 2022-05-12 DIAGNOSIS — G5702 Lesion of sciatic nerve, left lower limb: Secondary | ICD-10-CM | POA: Diagnosis not present

## 2022-05-12 DIAGNOSIS — R29898 Other symptoms and signs involving the musculoskeletal system: Secondary | ICD-10-CM | POA: Diagnosis not present

## 2022-05-12 DIAGNOSIS — M6281 Muscle weakness (generalized): Secondary | ICD-10-CM | POA: Diagnosis not present

## 2022-05-12 DIAGNOSIS — R2681 Unsteadiness on feet: Secondary | ICD-10-CM | POA: Diagnosis not present

## 2022-05-12 DIAGNOSIS — Z741 Need for assistance with personal care: Secondary | ICD-10-CM | POA: Diagnosis not present

## 2022-05-12 DIAGNOSIS — R278 Other lack of coordination: Secondary | ICD-10-CM | POA: Diagnosis not present

## 2022-05-14 DIAGNOSIS — R2681 Unsteadiness on feet: Secondary | ICD-10-CM | POA: Diagnosis not present

## 2022-05-14 DIAGNOSIS — R262 Difficulty in walking, not elsewhere classified: Secondary | ICD-10-CM | POA: Diagnosis not present

## 2022-05-14 DIAGNOSIS — R278 Other lack of coordination: Secondary | ICD-10-CM | POA: Diagnosis not present

## 2022-05-14 DIAGNOSIS — R4189 Other symptoms and signs involving cognitive functions and awareness: Secondary | ICD-10-CM | POA: Diagnosis not present

## 2022-05-14 DIAGNOSIS — G7001 Myasthenia gravis with (acute) exacerbation: Secondary | ICD-10-CM | POA: Diagnosis not present

## 2022-05-14 DIAGNOSIS — M6281 Muscle weakness (generalized): Secondary | ICD-10-CM | POA: Diagnosis not present

## 2022-05-14 DIAGNOSIS — R29898 Other symptoms and signs involving the musculoskeletal system: Secondary | ICD-10-CM | POA: Diagnosis not present

## 2022-05-14 DIAGNOSIS — Z741 Need for assistance with personal care: Secondary | ICD-10-CM | POA: Diagnosis not present

## 2022-05-14 DIAGNOSIS — G5702 Lesion of sciatic nerve, left lower limb: Secondary | ICD-10-CM | POA: Diagnosis not present

## 2022-05-15 ENCOUNTER — Ambulatory Visit
Admission: RE | Admit: 2022-05-15 | Discharge: 2022-05-15 | Disposition: A | Discharge status: Home / Self Care | Payer: Medicare PPO | Source: Ambulatory Visit | Attending: Neurology | Admitting: Neurology

## 2022-05-15 DIAGNOSIS — R4189 Other symptoms and signs involving cognitive functions and awareness: Secondary | ICD-10-CM | POA: Diagnosis not present

## 2022-05-15 DIAGNOSIS — Z741 Need for assistance with personal care: Secondary | ICD-10-CM | POA: Diagnosis not present

## 2022-05-15 DIAGNOSIS — R278 Other lack of coordination: Secondary | ICD-10-CM | POA: Diagnosis not present

## 2022-05-15 DIAGNOSIS — G7000 Myasthenia gravis without (acute) exacerbation: Secondary | ICD-10-CM | POA: Diagnosis not present

## 2022-05-15 DIAGNOSIS — J479 Bronchiectasis, uncomplicated: Secondary | ICD-10-CM | POA: Diagnosis not present

## 2022-05-15 DIAGNOSIS — R2681 Unsteadiness on feet: Secondary | ICD-10-CM | POA: Diagnosis not present

## 2022-05-15 DIAGNOSIS — I700 Atherosclerosis of aorta: Secondary | ICD-10-CM | POA: Diagnosis not present

## 2022-05-15 DIAGNOSIS — M6281 Muscle weakness (generalized): Secondary | ICD-10-CM | POA: Diagnosis not present

## 2022-05-15 DIAGNOSIS — R29898 Other symptoms and signs involving the musculoskeletal system: Secondary | ICD-10-CM | POA: Diagnosis not present

## 2022-05-15 DIAGNOSIS — G7001 Myasthenia gravis with (acute) exacerbation: Secondary | ICD-10-CM | POA: Diagnosis not present

## 2022-05-15 DIAGNOSIS — G5702 Lesion of sciatic nerve, left lower limb: Secondary | ICD-10-CM | POA: Diagnosis not present

## 2022-05-15 DIAGNOSIS — R262 Difficulty in walking, not elsewhere classified: Secondary | ICD-10-CM | POA: Diagnosis not present

## 2022-05-15 DIAGNOSIS — I2510 Atherosclerotic heart disease of native coronary artery without angina pectoris: Secondary | ICD-10-CM | POA: Diagnosis not present

## 2022-05-15 MED ORDER — IOPAMIDOL (ISOVUE-300) INJECTION 61%
75.0000 mL | Freq: Once | INTRAVENOUS | Status: AC | PRN
  Administered 2022-05-15: 75 mL via INTRAVENOUS

## 2022-05-16 DIAGNOSIS — G7001 Myasthenia gravis with (acute) exacerbation: Secondary | ICD-10-CM | POA: Diagnosis not present

## 2022-05-16 DIAGNOSIS — R4189 Other symptoms and signs involving cognitive functions and awareness: Secondary | ICD-10-CM | POA: Diagnosis not present

## 2022-05-16 DIAGNOSIS — R2681 Unsteadiness on feet: Secondary | ICD-10-CM | POA: Diagnosis not present

## 2022-05-16 DIAGNOSIS — M6281 Muscle weakness (generalized): Secondary | ICD-10-CM | POA: Diagnosis not present

## 2022-05-16 DIAGNOSIS — R29898 Other symptoms and signs involving the musculoskeletal system: Secondary | ICD-10-CM | POA: Diagnosis not present

## 2022-05-16 DIAGNOSIS — R262 Difficulty in walking, not elsewhere classified: Secondary | ICD-10-CM | POA: Diagnosis not present

## 2022-05-16 DIAGNOSIS — Z741 Need for assistance with personal care: Secondary | ICD-10-CM | POA: Diagnosis not present

## 2022-05-16 DIAGNOSIS — R278 Other lack of coordination: Secondary | ICD-10-CM | POA: Diagnosis not present

## 2022-05-16 DIAGNOSIS — G5702 Lesion of sciatic nerve, left lower limb: Secondary | ICD-10-CM | POA: Diagnosis not present

## 2022-05-20 ENCOUNTER — Ambulatory Visit: Payer: Medicare PPO | Admitting: Neurology

## 2022-05-20 DIAGNOSIS — G7001 Myasthenia gravis with (acute) exacerbation: Secondary | ICD-10-CM | POA: Diagnosis not present

## 2022-05-20 DIAGNOSIS — R2681 Unsteadiness on feet: Secondary | ICD-10-CM | POA: Diagnosis not present

## 2022-05-20 DIAGNOSIS — R4189 Other symptoms and signs involving cognitive functions and awareness: Secondary | ICD-10-CM | POA: Diagnosis not present

## 2022-05-20 DIAGNOSIS — Z741 Need for assistance with personal care: Secondary | ICD-10-CM | POA: Diagnosis not present

## 2022-05-20 DIAGNOSIS — M6281 Muscle weakness (generalized): Secondary | ICD-10-CM | POA: Diagnosis not present

## 2022-05-20 DIAGNOSIS — R29898 Other symptoms and signs involving the musculoskeletal system: Secondary | ICD-10-CM | POA: Diagnosis not present

## 2022-05-20 DIAGNOSIS — R278 Other lack of coordination: Secondary | ICD-10-CM | POA: Diagnosis not present

## 2022-05-20 DIAGNOSIS — R262 Difficulty in walking, not elsewhere classified: Secondary | ICD-10-CM | POA: Diagnosis not present

## 2022-05-20 DIAGNOSIS — G5702 Lesion of sciatic nerve, left lower limb: Secondary | ICD-10-CM | POA: Diagnosis not present

## 2022-05-20 NOTE — Telephone Encounter (Signed)
Patient declined to schedule at this time as she thinks she had one done and wants to discuss with lambert at [facility] tomorrow .   Will attempt to schedule at check out

## 2022-05-21 ENCOUNTER — Ambulatory Visit: Payer: Medicare PPO | Admitting: Cardiology

## 2022-05-21 ENCOUNTER — Encounter: Payer: Self-pay | Admitting: Cardiology

## 2022-05-21 ENCOUNTER — Other Ambulatory Visit: Payer: Self-pay | Admitting: Internal Medicine

## 2022-05-21 VITALS — BP 140/76 | HR 67 | Ht 64.0 in | Wt 170.2 lb

## 2022-05-21 DIAGNOSIS — I10 Essential (primary) hypertension: Secondary | ICD-10-CM | POA: Diagnosis not present

## 2022-05-21 DIAGNOSIS — I4819 Other persistent atrial fibrillation: Secondary | ICD-10-CM | POA: Diagnosis not present

## 2022-05-21 DIAGNOSIS — G7000 Myasthenia gravis without (acute) exacerbation: Secondary | ICD-10-CM | POA: Diagnosis not present

## 2022-05-21 DIAGNOSIS — I493 Ventricular premature depolarization: Secondary | ICD-10-CM | POA: Diagnosis not present

## 2022-05-21 DIAGNOSIS — G4733 Obstructive sleep apnea (adult) (pediatric): Secondary | ICD-10-CM | POA: Diagnosis not present

## 2022-05-21 MED ORDER — AMIODARONE HCL 200 MG PO TABS: ORAL_TABLET | ORAL | 3 refills | Status: DC

## 2022-05-21 MED ORDER — METOPROLOL SUCCINATE ER 25 MG PO TB24
25.0000 mg | ORAL_TABLET | Freq: Every day | ORAL | 3 refills | Status: DC
Start: 2022-05-21 — End: 2023-04-29

## 2022-05-21 NOTE — Patient Instructions (Addendum)
Medications: Reduce Metoprolol Succinate to 25 mg daily Start Amiodarone 200 mg two times a day for 7 days, then 200 mg daily going forward.  Your physician recommends that you continue on your current medications as directed. Please refer to the Current Medication list given to you today. *If you need a refill on your cardiac medications before your next appointment, please call your pharmacy*  Lab Work: CMP, TSH, Free T4 If you have labs (blood work) drawn today and your tests are completely normal, you will receive your results only by: [place] (if you have MyChart) OR A paper copy in the mail If you have any lab test that is abnormal or we need to change your treatment, we will call you to review the results.  Testing/Procedures: None.  Follow-Up: At [facility], you and your health needs are our priority.  As part of our continuing mission to provide you with exceptional heart care, we have created designated Provider Care Teams.  These Care Teams include your primary Cardiologist (physician) and Advanced Practice Providers (APPs -  Physician Assistants and Nurse Practitioners) who all work together to provide you with the care you need, when you need it.  Your physician wants you to follow-up in: 3 months with Lars Mage, MD  We recommend signing up for the patient portal called "MyChart".  Sign up information is provided on this After Visit Summary.  MyChart is used to connect with patients for Virtual Visits (Telemedicine).  Patients are able to view lab/test results, encounter notes, upcoming appointments, etc.  Non-urgent messages can be sent to your provider as well.   To learn more about what you can do with MyChart, go to [email or web address].    Any Other Special Instructions Will Be Listed Below (If Applicable).

## 2022-05-21 NOTE — Telephone Encounter (Signed)
Last filled 05-05-22 #30 Last OV 05-06-22 Next OV 06-10-22 [place]

## 2022-05-21 NOTE — Progress Notes (Signed)
Electrophysiology Office Note:    Date:  05/21/2022   ID:  Jennifer Schmitt, DOB [birth year], MRN [id number]  PCP:  Venia Carbon, MD  [facility] Cardiologist:  None  [facility] Electrophysiologist:  None   Referring MD: Minna Merritts, MD   Chief Complaint: AF and PVCs  History of Present Illness:    Jennifer Schmitt is a 87 y.o. female who presents for an evaluation of AF at the request of Dr Rockey Situ. Their medical history includes newly diagnosed myasthenia gravis, GERD, HTN, OSA. The patient last saw Dr Rockey Situ 03/17/2022. Since that visit myasthenia has been diagnosed.  She tells me that she is slowly improving with her myasthenia.  She is a retired PhD in Airline pilot.  She lives at [facility].  She helps care for her husband who has Alzheimer's.     Past Medical History:  Diagnosis Date   Adenomatous colon polyp    Allergic rhinitis due to pollen    Anemia    Anxiety    Bronchiectasis (HCC)    Complication of anesthesia    Degenerative disc disease    cervical and lumbar   Dysrhythmia    GERD (gastroesophageal reflux disease)    Hearing loss in right ear    Heart murmur    HOH (hard of hearing)    AIDS   Hyperlipidemia    Hypertension    Osteoarthritis, multiple sites    PAF (paroxysmal atrial fibrillation) ([place])    a. 01/2013 [facility]): EF 60-65%, no rwma, mildly dil LA/RA, mild AI/TR, trace MR, nl RV size/fxn, mild PAH; b. CHA2DS2VASc = 4-->Chronic Xarelto.   PMR (polymyalgia rheumatica) (HCC)    PONV (postoperative nausea and vomiting)    Rheumatic fever    Sleep apnea    NO CPAP   Urge incontinence    Vertigo    Vertigo     Past Surgical History:  Procedure Laterality Date   ABDOMINAL HYSTERECTOMY     ANKLE FRACTURE SURGERY Left 1996   BACK SURGERY     BREAST BIOPSY Left 2010   negative   CATARACT EXTRACTION W/PHACO Left 10/05/2018   Procedure: CATARACT EXTRACTION PHACO AND INTRAOCULAR LENS PLACEMENT ([place]);  Surgeon:  Birder Robson, MD;  Location: [facility] ORS;  Service: Ophthalmology;  Laterality: Left;  [place] 00:40 CDE 6.92 fluid pack lot # [id number]      CATARACT EXTRACTION W/PHACO Right 10/26/2018   Procedure: CATARACT EXTRACTION PHACO AND INTRAOCULAR LENS PLACEMENT ([facility]);  Surgeon: Birder Robson, MD;  Location: [facility] ORS;  Service: Ophthalmology;  Laterality: Right;  [place]  00:47 CDE 7.11 Fluid pack lot # [id number]   DEXA  11/09/06   normal   FRACTURE SURGERY     Meningitis  1963   SPINE SURGERY     SYNOVECTOMY Right 1973   elbow   TONSILLECTOMY AND ADENOIDECTOMY      Current Medications: Current Meds  Medication Sig   acetaminophen (TYLENOL) 500 MG tablet Take 1,000 mg by mouth as needed.   amiodarone (PACERONE) 200 MG tablet Take 1 tablet (200 mg total) by mouth 2 (two) times daily for 7 days, THEN 1 tablet (200 mg total) daily.   calcium carbonate (CALCIUM 600) 600 MG TABS tablet Take 1 tablet (600 mg total) by mouth daily.   Cholecalciferol (VITAMIN D) 2000 units CAPS Take 2,000 Units by mouth daily.   cyanocobalamin (,VITAMIN B-12,) 1000 MCG/ML injection INJECT 1 ML MONTHLY AS DIRECTED   diltiazem (CARDIZEM) 30 MG  tablet Take 1 tablet (30 mg total) by mouth 3 (three) times daily as needed (atrial fibrillation lasting more than 2 hours).   dimenhyDRINATE (DRAMAMINE PO) Take by mouth.   fluticasone (FLONASE) 50 MCG/ACT nasal spray Place 1 spray into both nostrils daily.   folic acid (FOLVITE) 1 MG tablet Take 1 tablet (1 mg total) by mouth daily.   hydrocortisone 2.5 % cream Apply topically 3 (three) times daily as needed.   lansoprazole (PREVACID) 15 MG capsule Take 1 capsule (15 mg total) by mouth daily at 12 noon.   levocetirizine (XYZAL) 5 MG tablet Take 5 mg by mouth every evening.   LORazepam (ATIVAN) 0.5 MG tablet TAKE ONE HALF TO 1 TABLET BY MOUTH TWICEDAILY AS NEEDED FOR ANXIETY   metoprolol succinate (TOPROL XL) 25 MG 24 hr tablet Take 1 tablet (25 mg total) by mouth daily.    predniSONE (DELTASONE) 20 MG tablet Take 1 tablet (20 mg total) by mouth daily.   propranolol (INDERAL) 10 MG tablet Take 1 tablet (10 mg total) by mouth 3 (three) times daily as needed (atrial fibrillation lasting more than 2 hours).   pyridostigmine (MESTINON) 60 MG tablet Take 1 tablet at 7 AM, 1 tablet at 1 PM and 1 tablet at 7 PM   rivaroxaban (XARELTO) 20 MG TABS tablet TAKE 1 TABLET BY MOUTH EVERY DAY WITH THE EVENING MEAL   rosuvastatin (CRESTOR) 5 MG tablet Take 1 tablet (5 mg total) by mouth daily.   Trolamine Salicylate (ASPERCREME EX) Apply topically as needed.   valsartan (DIOVAN) 80 MG tablet TAKE 1 TABLET(80 MG) BY MOUTH DAILY   [DISCONTINUED] metoprolol succinate (TOPROL-XL) 50 MG 24 hr tablet Take 1 tablet (50 mg total) by mouth daily.     Allergies:   Actonel [risedronate sodium], Boniva [ibandronic acid], Diclofenac, Fosamax [alendronate sodium], Lipitor [atorvastatin], Miacalcin [calcitonin (salmon)], Myrbetriq [mirabegron], Talwin [pentazocine], Latex, and Niaspan [niacin er]   Social History   Socioeconomic History   Marital status: Married    Spouse name: Not on file   Number of children: 1   Years of education: Not on file   Highest education level: Not on file  Occupational History   Occupation: Scientist, physiological and college professor    Comment: [employer]--PhD in Special education  Tobacco Use   Smoking status: Never   Smokeless tobacco: Former  Scientific laboratory technician Use: Never used  Substance and Sexual Activity   Alcohol use: Yes    Comment: OCCAS   Drug use: No   Sexual activity: Not on file  Other Topics Concern   Not on file  Social History Narrative   Retired 2007    1 son in [place] area   Spends part time in [place]      Has living will   Husband then son is health care POA   Not sure about DNR---will keep open resuscitation for now    No tube feedings if cognitively unaware   Right handed               Social Determinants of Health    Financial Resource Strain: Not on file  Food Insecurity: Not on file  Transportation Needs: Not on file  Physical Activity: Not on file  Stress: Not on file  Social Connections: Not on file     Family History: The patient's family history includes Arthritis in her father and mother; Breast cancer (age of onset: 66) in her paternal aunt; Cancer in her father; Colon  cancer in her maternal grandmother; Diabetes in her father; Heart disease in her mother; Ovarian cancer in her maternal grandmother; Rheum arthritis in her mother; Schizophrenia in her mother; Uterine cancer in her maternal grandmother.  ROS:   Please see the history of present illness.    All other systems reviewed and are negative.  EKGs/Labs/Other Studies Reviewed:    The following studies were reviewed today:  04/01/2022 Zio 22.5% PVC burden, monomorphic. Bigeminy No af  03/17/2022 ECG - trigeminal frequent PVCs. Negative in III/aVF, positive in I. Precordial transition in V2.       Recent Labs: 03/04/2022: ALT 47; BUN 15; Creatinine, Ser 0.71; Hemoglobin 12.6; Potassium 4.5; Sodium 139 04/23/2022: Platelets 309  Recent Lipid Panel    Component Value Date/Time   CHOL 146 11/29/2021 0840   TRIG 92.0 11/29/2021 0840   HDL 59.70 11/29/2021 0840   CHOLHDL 2 11/29/2021 0840   VLDL 18.4 11/29/2021 0840   LDLCALC 68 11/29/2021 0840    Physical Exam:    VS:  BP 140/76   Pulse 67   Ht 5' 4"  (1.626 m)   Wt 170 lb 3.2 oz (77.2 kg)   SpO2 96%   BMI 29.21 kg/m     Wt Readings from Last 3 Encounters:  05/21/22 170 lb 3.2 oz (77.2 kg)  05/08/22 159 lb (72.1 kg)  04/28/22 175 lb (79.4 kg)     GEN:  Well nourished, well developed in no acute distress HEENT: Normal NECK: No JVD; No carotid bruits LYMPHATICS: No lymphadenopathy CARDIAC: Bigeminal PVCs/skipped beats/irregular rhythm, no murmurs, rubs, gallops RESPIRATORY:  Clear to auscultation without rales, wheezing or rhonchi  ABDOMEN: Soft, non-tender,  non-distended MUSCULOSKELETAL:  No edema; No deformity  SKIN: Warm and dry NEUROLOGIC:  Alert and oriented x 3 PSYCHIATRIC:  Normal affect       ASSESSMENT:    1. PVC (premature ventricular contraction)   2. Persistent atrial fibrillation ([place])   3. Obstructive sleep apnea   4. Primary hypertension   5. Myasthenia gravis ([place])    PLAN:    In order of problems listed above:  #Frequent PVCs Symptomatic.  Bigeminal during today's physical exam.  Large burden. We will plan to decrease the beta-blockers to half dose metoprolol today and add amiodarone to see if this will successfully suppress the PVC.  We will plan for 200 twice daily for 7 days followed by 200 mg daily thereafter.  I will bring her back in 6 to 8 weeks for blood work to check a CMP, TSH and free T4.  I will plan to see the patient back in clinic in 3 months to reassess response to therapy.  #Persistent atrial fibrillation Maintaining sinus rhythm with frequent PVCs right now. Adding amiodarone as above which will help with both the PVCs and atrial fibrillation  #Hypertension Upper limit of normal today.  #Myasthenia gravis Improving.  Follows with neurology.   Medication Adjustments/Labs and Tests Ordered: Current medicines are reviewed at length with the patient today.  Concerns regarding medicines are outlined above.  Orders Placed This Encounter  Procedures   Comp Met (CMET)   TSH   T4, free   Meds ordered this encounter  Medications   metoprolol succinate (TOPROL XL) 25 MG 24 hr tablet    Sig: Take 1 tablet (25 mg total) by mouth daily.    Dispense:  90 tablet    Refill:  3   amiodarone (PACERONE) 200 MG tablet    Sig: Take 1 tablet (  200 mg total) by mouth 2 (two) times daily for 7 days, THEN 1 tablet (200 mg total) daily.    Dispense:  90 tablet    Refill:  3     Signed, Lysbeth Galas T. Quentin Ore, MD, [facility], [facility] 05/21/2022 1:26 PM    Electrophysiology [facility]

## 2022-05-22 DIAGNOSIS — R29898 Other symptoms and signs involving the musculoskeletal system: Secondary | ICD-10-CM | POA: Diagnosis not present

## 2022-05-22 DIAGNOSIS — R4189 Other symptoms and signs involving cognitive functions and awareness: Secondary | ICD-10-CM | POA: Diagnosis not present

## 2022-05-22 DIAGNOSIS — R278 Other lack of coordination: Secondary | ICD-10-CM | POA: Diagnosis not present

## 2022-05-22 DIAGNOSIS — R2681 Unsteadiness on feet: Secondary | ICD-10-CM | POA: Diagnosis not present

## 2022-05-22 DIAGNOSIS — R262 Difficulty in walking, not elsewhere classified: Secondary | ICD-10-CM | POA: Diagnosis not present

## 2022-05-22 DIAGNOSIS — M6281 Muscle weakness (generalized): Secondary | ICD-10-CM | POA: Diagnosis not present

## 2022-05-22 DIAGNOSIS — G5702 Lesion of sciatic nerve, left lower limb: Secondary | ICD-10-CM | POA: Diagnosis not present

## 2022-05-22 DIAGNOSIS — Z741 Need for assistance with personal care: Secondary | ICD-10-CM | POA: Diagnosis not present

## 2022-05-22 DIAGNOSIS — G7001 Myasthenia gravis with (acute) exacerbation: Secondary | ICD-10-CM | POA: Diagnosis not present

## 2022-05-23 DIAGNOSIS — M6281 Muscle weakness (generalized): Secondary | ICD-10-CM | POA: Diagnosis not present

## 2022-05-23 DIAGNOSIS — Z741 Need for assistance with personal care: Secondary | ICD-10-CM | POA: Diagnosis not present

## 2022-05-23 DIAGNOSIS — G5702 Lesion of sciatic nerve, left lower limb: Secondary | ICD-10-CM | POA: Diagnosis not present

## 2022-05-23 DIAGNOSIS — R29898 Other symptoms and signs involving the musculoskeletal system: Secondary | ICD-10-CM | POA: Diagnosis not present

## 2022-05-23 DIAGNOSIS — G7001 Myasthenia gravis with (acute) exacerbation: Secondary | ICD-10-CM | POA: Diagnosis not present

## 2022-05-23 DIAGNOSIS — R262 Difficulty in walking, not elsewhere classified: Secondary | ICD-10-CM | POA: Diagnosis not present

## 2022-05-23 DIAGNOSIS — R278 Other lack of coordination: Secondary | ICD-10-CM | POA: Diagnosis not present

## 2022-05-23 DIAGNOSIS — R4189 Other symptoms and signs involving cognitive functions and awareness: Secondary | ICD-10-CM | POA: Diagnosis not present

## 2022-05-23 DIAGNOSIS — R2681 Unsteadiness on feet: Secondary | ICD-10-CM | POA: Diagnosis not present

## 2022-05-26 DIAGNOSIS — Z741 Need for assistance with personal care: Secondary | ICD-10-CM | POA: Diagnosis not present

## 2022-05-26 DIAGNOSIS — G5702 Lesion of sciatic nerve, left lower limb: Secondary | ICD-10-CM | POA: Diagnosis not present

## 2022-05-26 DIAGNOSIS — R262 Difficulty in walking, not elsewhere classified: Secondary | ICD-10-CM | POA: Diagnosis not present

## 2022-05-26 DIAGNOSIS — G7001 Myasthenia gravis with (acute) exacerbation: Secondary | ICD-10-CM | POA: Diagnosis not present

## 2022-05-26 DIAGNOSIS — R29898 Other symptoms and signs involving the musculoskeletal system: Secondary | ICD-10-CM | POA: Diagnosis not present

## 2022-05-26 DIAGNOSIS — M6281 Muscle weakness (generalized): Secondary | ICD-10-CM | POA: Diagnosis not present

## 2022-05-26 DIAGNOSIS — R278 Other lack of coordination: Secondary | ICD-10-CM | POA: Diagnosis not present

## 2022-05-26 DIAGNOSIS — R2681 Unsteadiness on feet: Secondary | ICD-10-CM | POA: Diagnosis not present

## 2022-05-26 DIAGNOSIS — R4189 Other symptoms and signs involving cognitive functions and awareness: Secondary | ICD-10-CM | POA: Diagnosis not present

## 2022-05-30 ENCOUNTER — Other Ambulatory Visit: Payer: Self-pay | Admitting: Internal Medicine

## 2022-05-30 DIAGNOSIS — R4189 Other symptoms and signs involving cognitive functions and awareness: Secondary | ICD-10-CM | POA: Diagnosis not present

## 2022-05-30 DIAGNOSIS — G5702 Lesion of sciatic nerve, left lower limb: Secondary | ICD-10-CM | POA: Diagnosis not present

## 2022-05-30 DIAGNOSIS — G7001 Myasthenia gravis with (acute) exacerbation: Secondary | ICD-10-CM | POA: Diagnosis not present

## 2022-05-30 DIAGNOSIS — R262 Difficulty in walking, not elsewhere classified: Secondary | ICD-10-CM | POA: Diagnosis not present

## 2022-05-30 DIAGNOSIS — M6281 Muscle weakness (generalized): Secondary | ICD-10-CM | POA: Diagnosis not present

## 2022-05-30 DIAGNOSIS — Z741 Need for assistance with personal care: Secondary | ICD-10-CM | POA: Diagnosis not present

## 2022-05-30 DIAGNOSIS — R278 Other lack of coordination: Secondary | ICD-10-CM | POA: Diagnosis not present

## 2022-05-30 DIAGNOSIS — R2681 Unsteadiness on feet: Secondary | ICD-10-CM | POA: Diagnosis not present

## 2022-05-30 DIAGNOSIS — R29898 Other symptoms and signs involving the musculoskeletal system: Secondary | ICD-10-CM | POA: Diagnosis not present

## 2022-05-30 NOTE — Telephone Encounter (Signed)
Last office visit 05/06/22 for Myasthenia gravis.  Last refilled 05/22/22 for #30 with no refills.  Next Appt: 06/10/22 for 1 month follow up.

## 2022-06-02 DIAGNOSIS — R29898 Other symptoms and signs involving the musculoskeletal system: Secondary | ICD-10-CM | POA: Diagnosis not present

## 2022-06-02 DIAGNOSIS — M6281 Muscle weakness (generalized): Secondary | ICD-10-CM | POA: Diagnosis not present

## 2022-06-02 DIAGNOSIS — Z741 Need for assistance with personal care: Secondary | ICD-10-CM | POA: Diagnosis not present

## 2022-06-02 DIAGNOSIS — G5702 Lesion of sciatic nerve, left lower limb: Secondary | ICD-10-CM | POA: Diagnosis not present

## 2022-06-02 DIAGNOSIS — R4189 Other symptoms and signs involving cognitive functions and awareness: Secondary | ICD-10-CM | POA: Diagnosis not present

## 2022-06-02 DIAGNOSIS — R262 Difficulty in walking, not elsewhere classified: Secondary | ICD-10-CM | POA: Diagnosis not present

## 2022-06-02 DIAGNOSIS — R2681 Unsteadiness on feet: Secondary | ICD-10-CM | POA: Diagnosis not present

## 2022-06-02 DIAGNOSIS — R278 Other lack of coordination: Secondary | ICD-10-CM | POA: Diagnosis not present

## 2022-06-02 DIAGNOSIS — G7001 Myasthenia gravis with (acute) exacerbation: Secondary | ICD-10-CM | POA: Diagnosis not present

## 2022-06-02 NOTE — Telephone Encounter (Signed)
Spoke to pt. She said she is not taking it every day. She was having another med delivered and thought it would be easier to have that done, too. I advised that she will need to let us know when she ets down to about 4 so it can be done and delivered before she runs out.

## 2022-06-04 ENCOUNTER — Other Ambulatory Visit
Admission: RE | Admit: 2022-06-04 | Discharge: 2022-06-04 | Disposition: A | Discharge status: Home / Self Care | Payer: Medicare PPO | Attending: Cardiology | Admitting: Cardiology

## 2022-06-04 DIAGNOSIS — I4819 Other persistent atrial fibrillation: Secondary | ICD-10-CM | POA: Insufficient documentation

## 2022-06-04 DIAGNOSIS — I493 Ventricular premature depolarization: Secondary | ICD-10-CM | POA: Diagnosis not present

## 2022-06-04 DIAGNOSIS — I10 Essential (primary) hypertension: Secondary | ICD-10-CM | POA: Insufficient documentation

## 2022-06-04 DIAGNOSIS — G4733 Obstructive sleep apnea (adult) (pediatric): Secondary | ICD-10-CM | POA: Diagnosis not present

## 2022-06-04 DIAGNOSIS — G7000 Myasthenia gravis without (acute) exacerbation: Secondary | ICD-10-CM | POA: Insufficient documentation

## 2022-06-04 LAB — COMPREHENSIVE METABOLIC PANEL
ALT: 35 U/L (ref 0–44)
AST: 28 U/L (ref 15–41)
Albumin: 3.7 g/dL (ref 3.5–5.0)
Alkaline Phosphatase: 61 U/L (ref 38–126)
Anion gap: 8 (ref 5–15)
BUN: 13 mg/dL (ref 8–23)
CO2: 28 mmol/L (ref 22–32)
Calcium: 9.1 mg/dL (ref 8.9–10.3)
Chloride: 106 mmol/L (ref 98–111)
Creatinine, Ser: 0.54 mg/dL (ref 0.44–1.00)
GFR, Estimated: >60 mL/min (ref >60)
Glucose, Bld: 103 mg/dL — ABNORMAL HIGH (ref 70–99)
Potassium: 4.2 mmol/L (ref 3.5–5.1)
Sodium: 142 mmol/L (ref 135–145)
Total Bilirubin: 0.6 mg/dL (ref 0.3–1.2)
Total Protein: 7.3 g/dL (ref 6.5–8.1)

## 2022-06-04 LAB — TSH: TSH: 1.668 u[IU]/mL (ref 0.350–4.500)

## 2022-06-04 LAB — T4, FREE: Free T4: 0.95 ng/dL (ref 0.61–1.12)

## 2022-06-04 NOTE — Progress Notes (Signed)
NEUROLOGY FOLLOW UP OFFICE NOTE  DELANA MANGANELLO [id number]  Assessment/Plan:   Seropositive Myasthenia Gravis -  Due to head drop and currently not on immunosuppressant therapy, recommended hospital admission for acute course of IVIG to prevent her from a myasthenic crisis.  She declines as she says she must stay home to care for her husband with Alzheimer's and would rather avoid the hospital regardless.  Since the head drop is not new (has been ongoing for months without change), we can monitor.  I did instruct her to go the ED immediately if she begins to get worse. Hypertension  Will start CellCept '500mg'$  twice daily for long-term management.  Check CBC and CMP weekly for 4 weeks, followed by monthly for 3 months. As she cannot tolerate prednisone, will set up for IVIG 1g/kg every 28 days until CellCept therapeutic.   Mestinon to '60mg'$  at 7 AM, 1 PM and 7 PM Will have her follow up with me in 4 weeks We are having a new neuromuscular specialist, Dr. Kai Levins, start with [place] in August.  Plan is for patient to follow up with Dr. Berdine Addison in September. Follow up with PCP regarding blood pressure  Total time spent reviewing records and spent face to face with patient discussing diagnosis, complications of diagnosis and plan was 65 minutes.     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  UPDATE: Current medications:  Mestinon '60mg'$  three times daily (7 AM, 1 PM, and 7 PM),  Last month, started prednisone and increased Mestinon.  She self-discontinued the prednisone due to side effects - made her jittery, she couldn't sleep, etc.  I was not aware of this.  She doesn't feel well overall.  Reports runny nose and congestion due to allergies.  Also reports constipation.  She feels she can walk a little better without assistance but still has profound head  drop.  Denies double vision, dysphagia, or difficulty breathing.    CT chest with contrast on 05/15/2022 personally reviewed was negative for thymoma.     HISTORY: Since January-February 2023, she has had progressive weakness.  She began having trouble rolling over in bed.  She was unable to get up and off her bed so she started sleeping on the couch..  Noted weakness in the left leg.  Seems to be dragging her right leg.  When standing, always with sensation that legs will give out. She has had falls and unable to stand up, requiring to call EMS.  Notes particular weakness in the proximal arms and trouble keeping her head up.  Started relying on a cane more frequently and then wheelchair.  She also endorsed worsening musculoskeletal pain with pain from the neck down her spine to her sacrum.  She does have degenerative spine disease and polymyalgia rheumatica.  MRI of brain with and without contrast on 04/27/2022 personally reviewed showed moderate chronic small vessel ischemic changes and known 1 x 2 mm vestibular schwannoma within the right internal auditory canal, stable compared to prior imaging in 2017.  She started noticing that her left eyelid was drooping.  She went to the ophthalmologist who suspected temporal arteritis given her history of PMR.  No vision loss.  Sed rate and CRP on 04/23/2022 were 101 and 10.1 respectively and was started on prednisone '60mg'$  daily with some improvement.  Based on strong likelihood of giant cell arteritis  with elevated inflammatory markers and having PMR, temporal artery biopsy was not pursued.  She was started on prednisone '60mg'$  daily but discontinued after 3 days due to side effects.  She also has had increased urinary urgency and sometimes fecal incontinence.  Due to sacral pain following a fall, she had X-ray of lumbar spine and MRI of sacrum which were negative for occult fracture.  AChR Binding Ab on 04/28/2022 was elevated at 28.10 nmol/L.  PCP started her on  pyridostigmine '30mg'$  three times daily.  She notes improvement in strength in the legs.  However, she still has a head drop.  Denies double vision, trouble swallowing, trouble chewing, and trouble taking a breath.  PAST MEDICAL HISTORY: Past Medical History:  Diagnosis Date   Adenomatous colon polyp    Allergic rhinitis due to pollen    Anemia    Anxiety    Bronchiectasis ([place])    Complication of anesthesia    Degenerative disc disease    cervical and lumbar   Dysrhythmia    GERD (gastroesophageal reflux disease)    Hearing loss in right ear    Heart murmur    HOH (hard of hearing)    AIDS   Hyperlipidemia    Hypertension    Osteoarthritis, multiple sites    PAF (paroxysmal atrial fibrillation) ([place])    a. 01/2013 [facility]): EF 60-65%, no rwma, mildly dil LA/RA, mild AI/TR, trace MR, nl RV size/fxn, mild PAH; b. CHA2DS2VASc = 4-->Chronic Xarelto.   PMR (polymyalgia rheumatica) (HCC)    PONV (postoperative nausea and vomiting)    Rheumatic fever    Sleep apnea    NO CPAP   Urge incontinence    Vertigo    Vertigo     MEDICATIONS: Current Outpatient Medications on File Prior to Visit  Medication Sig Dispense Refill   acetaminophen (TYLENOL) 500 MG tablet Take 1,000 mg by mouth as needed.     amiodarone (PACERONE) 200 MG tablet Take 1 tablet (200 mg total) by mouth 2 (two) times daily for 7 days, THEN 1 tablet (200 mg total) daily. 90 tablet 3   calcium carbonate (CALCIUM 600) 600 MG TABS tablet Take 1 tablet (600 mg total) by mouth daily. 30 tablet 0   Cholecalciferol (VITAMIN D) 2000 units CAPS Take 2,000 Units by mouth daily.     cyanocobalamin (,VITAMIN B-12,) 1000 MCG/ML injection INJECT 1 ML MONTHLY AS DIRECTED 10 mL 1   diltiazem (CARDIZEM) 30 MG tablet Take 1 tablet (30 mg total) by mouth 3 (three) times daily as needed (atrial fibrillation lasting more than 2 hours). 60 tablet 3   dimenhyDRINATE (DRAMAMINE PO) Take by mouth.     fluticasone (FLONASE) 50  MCG/ACT nasal spray Place 1 spray into both nostrils daily.     folic acid (FOLVITE) 1 MG tablet Take 1 tablet (1 mg total) by mouth daily. 90 tablet 3   hydrocortisone 2.5 % cream Apply topically 3 (three) times daily as needed. 28 g 3   lansoprazole (PREVACID) 15 MG capsule Take 1 capsule (15 mg total) by mouth daily at 12 noon. 30 capsule 5   levocetirizine (XYZAL) 5 MG tablet Take 5 mg by mouth every evening.     LORazepam (ATIVAN) 0.5 MG tablet TAKE ONE HALF TO 1 TABLET BY MOUTH TWICEDAILY AS NEEDED FOR ANXIETY 30 tablet 0   metoprolol succinate (TOPROL XL) 25 MG 24 hr tablet Take 1 tablet (25 mg total) by mouth daily. 90 tablet 3  predniSONE (DELTASONE) 20 MG tablet Take 1 tablet (20 mg total) by mouth daily. 30 tablet 5   propranolol (INDERAL) 10 MG tablet Take 1 tablet (10 mg total) by mouth 3 (three) times daily as needed (atrial fibrillation lasting more than 2 hours). 60 tablet 6   pyridostigmine (MESTINON) 60 MG tablet Take 1 tablet at 7 AM, 1 tablet at 1 PM and 1 tablet at 7 PM 90 tablet 5   rivaroxaban (XARELTO) 20 MG TABS tablet TAKE 1 TABLET BY MOUTH EVERY DAY WITH THE EVENING MEAL 90 tablet 1   rosuvastatin (CRESTOR) 5 MG tablet Take 1 tablet (5 mg total) by mouth daily. 90 tablet 3   Trolamine Salicylate (ASPERCREME EX) Apply topically as needed.     valsartan (DIOVAN) 80 MG tablet TAKE 1 TABLET(80 MG) BY MOUTH DAILY 90 tablet 3   No current facility-administered medications on file prior to visit.    ALLERGIES: Allergies  Allergen Reactions   Actonel [Risedronate Sodium] Nausea And Vomiting   Boniva [Ibandronic Acid] Nausea And Vomiting   Diclofenac Other (See Comments)    Voltaren Gel aggravated her sinus passages   Fosamax [Alendronate Sodium] Nausea And Vomiting   Lipitor [Atorvastatin] Other (See Comments)    Muscle aches. Tolerates Crestor.   Miacalcin [Calcitonin ([place])] Other (See Comments)    Sneezing    Myrbetriq [Mirabegron] Other (See Comments)     Gastritis   Talwin [Pentazocine] Other (See Comments)    Hallucinations    Latex Rash    Sneezing, watery eyes.   Niaspan [Niacin Er] Swelling and Rash    FAMILY HISTORY: Family History  Problem Relation Age of Onset   Schizophrenia Mother        paranoid   Heart disease Mother        from psych meds   Arthritis Mother    Rheum arthritis Mother    Cancer Father        prostate   Arthritis Father    Diabetes Father    Ovarian cancer Maternal Grandmother    Uterine cancer Maternal Grandmother    Colon cancer Maternal Grandmother    Breast cancer Paternal Aunt 26      Objective:  Blood pressure (!) 182/53, pulse 83, resp. rate 20, height '5\' 6"'$  (1.676 m), SpO2 98 %. General: No acute distress.  Patient appears well-groomed.   Head:  Normocephalic/atraumatic Eyes:  Fundi examined but not visualized Neck: supple, no paraspinal tenderness, full range of motion Heart:  Regular rate and rhythm Neurological Exam: alert and oriented to person, place, and time.  Speech fluent and not dysarthric, language intact.  CN II-XII intact. Bulk and tone normal Motor: Head drop.  Neck flexors 5/5.  Neck extensors 5-/5 against resistance Upper Extremities:      R          L Deltoid                         5/5       5/5 Biceps                         5/5       5/5 Triceps                        5/5       5/5 Wrist extensors           5/5  5/5 Wrist flexors                5/5       5/5 Finger extensors         5/5       5/5 Finger flexors              5/5       5/5 Dorsal interossei         5/5       5/5 Abductor pollicis          5/5       5/5     Lower Extremities:      R          L Hip flexors                   5-/5      3/5 Adductors                    5/5       5/5 Abductors                    5/5       5/5 Knee flexors                5/5       5/5 Knee extensors           5/5       5/5 Dorsiflexors                 5/5       5/5 Plantarflexors              5/5       5/5 Toe  flexors                  5/5       5/5 Toe extensors             5/5       5/5 Sensation to light touch intact.  Deep tendon reflexes 1+ throughout.  Finger to nose testing intact.  In wheelchair.  Needs to push up with upper extremities to stand.  Broad-based gait with short stride.  Romberg with sway.   Metta Clines, DO  CC: [name], MD

## 2022-06-05 ENCOUNTER — Ambulatory Visit: Payer: Medicare PPO | Admitting: Neurology

## 2022-06-05 ENCOUNTER — Encounter: Payer: Self-pay | Admitting: Neurology

## 2022-06-05 VITALS — BP 182/53 | HR 83 | Resp 20 | Ht 66.0 in | Wt 170.0 lb

## 2022-06-05 DIAGNOSIS — G7000 Myasthenia gravis without (acute) exacerbation: Secondary | ICD-10-CM

## 2022-06-05 MED ORDER — MYCOPHENOLATE MOFETIL 500 MG PO TABS: 500.0000 mg | ORAL_TABLET | Freq: Two times a day (BID) | ORAL | 3 refills | Status: DC

## 2022-06-05 NOTE — Patient Instructions (Addendum)
If you feel like you are getting worse, please go to hospital Start IVIG every 28 days Start CellCept '500mg'$  twice daily.  Check CBC and CMP weekly for 4 weeks, then monthly for 3 months.   Follow up with me in 4 weeks Follow up with Dr. Berdine Addison in 3 months.

## 2022-06-06 ENCOUNTER — Telehealth: Payer: Self-pay | Admitting: Neurology

## 2022-06-06 ENCOUNTER — Other Ambulatory Visit: Payer: Self-pay

## 2022-06-06 DIAGNOSIS — R29898 Other symptoms and signs involving the musculoskeletal system: Secondary | ICD-10-CM | POA: Diagnosis not present

## 2022-06-06 DIAGNOSIS — M6281 Muscle weakness (generalized): Secondary | ICD-10-CM | POA: Diagnosis not present

## 2022-06-06 DIAGNOSIS — Z741 Need for assistance with personal care: Secondary | ICD-10-CM | POA: Diagnosis not present

## 2022-06-06 DIAGNOSIS — R2681 Unsteadiness on feet: Secondary | ICD-10-CM | POA: Diagnosis not present

## 2022-06-06 DIAGNOSIS — R4189 Other symptoms and signs involving cognitive functions and awareness: Secondary | ICD-10-CM | POA: Diagnosis not present

## 2022-06-06 DIAGNOSIS — R278 Other lack of coordination: Secondary | ICD-10-CM | POA: Diagnosis not present

## 2022-06-06 DIAGNOSIS — R262 Difficulty in walking, not elsewhere classified: Secondary | ICD-10-CM | POA: Diagnosis not present

## 2022-06-06 DIAGNOSIS — G7001 Myasthenia gravis with (acute) exacerbation: Secondary | ICD-10-CM | POA: Diagnosis not present

## 2022-06-06 DIAGNOSIS — G5702 Lesion of sciatic nerve, left lower limb: Secondary | ICD-10-CM | POA: Diagnosis not present

## 2022-06-06 MED ORDER — PREDNISONE 20 MG PO TABS: 20.0000 mg | ORAL_TABLET | Freq: Every day | ORAL | 0 refills | Status: DC

## 2022-06-06 MED ORDER — LANSOPRAZOLE 15 MG PO CPDR: 15.0000 mg | DELAYED_RELEASE_CAPSULE | Freq: Every day | ORAL | 1 refills | Status: DC

## 2022-06-06 NOTE — Telephone Encounter (Signed)
Line busy at 1045 06/06/2022

## 2022-06-06 NOTE — Telephone Encounter (Signed)
Patient called and said she does not want to proceed with infusion therapy, she'd rather deal with the side effects from prednisone instead. She has plenty of the prednisone and is willing to restart it. She'd like Dr. Georgie Chard recommendation.  2. Cellcept is not in the pill-pack she she has from the pharmacy, she'll reach them and let the office know what the problem is.  3. She is still waiting to hear back from [facility] to see if they can do her blood-work. So instead, she's like the orders sent to [facility] which is closer to her.

## 2022-06-06 NOTE — Telephone Encounter (Signed)
I spoke with patient and she thanked me for calling. Sent new Rx into pharmacy for Prevacid 15 and Predisone per Dr. Georgie Chard request.

## 2022-06-09 DIAGNOSIS — G5702 Lesion of sciatic nerve, left lower limb: Secondary | ICD-10-CM | POA: Diagnosis not present

## 2022-06-09 DIAGNOSIS — M6281 Muscle weakness (generalized): Secondary | ICD-10-CM | POA: Diagnosis not present

## 2022-06-09 DIAGNOSIS — R278 Other lack of coordination: Secondary | ICD-10-CM | POA: Diagnosis not present

## 2022-06-09 DIAGNOSIS — R262 Difficulty in walking, not elsewhere classified: Secondary | ICD-10-CM | POA: Diagnosis not present

## 2022-06-09 DIAGNOSIS — R29898 Other symptoms and signs involving the musculoskeletal system: Secondary | ICD-10-CM | POA: Diagnosis not present

## 2022-06-09 DIAGNOSIS — G7001 Myasthenia gravis with (acute) exacerbation: Secondary | ICD-10-CM | POA: Diagnosis not present

## 2022-06-09 DIAGNOSIS — R4189 Other symptoms and signs involving cognitive functions and awareness: Secondary | ICD-10-CM | POA: Diagnosis not present

## 2022-06-09 DIAGNOSIS — Z741 Need for assistance with personal care: Secondary | ICD-10-CM | POA: Diagnosis not present

## 2022-06-09 DIAGNOSIS — R2681 Unsteadiness on feet: Secondary | ICD-10-CM | POA: Diagnosis not present

## 2022-06-10 ENCOUNTER — Ambulatory Visit: Payer: Medicare PPO | Admitting: Internal Medicine

## 2022-06-10 ENCOUNTER — Encounter: Payer: Self-pay | Admitting: Internal Medicine

## 2022-06-10 DIAGNOSIS — G7000 Myasthenia gravis without (acute) exacerbation: Secondary | ICD-10-CM | POA: Diagnosis not present

## 2022-06-10 DIAGNOSIS — M3501 Sicca syndrome with keratoconjunctivitis: Secondary | ICD-10-CM | POA: Diagnosis not present

## 2022-06-10 DIAGNOSIS — I10 Essential (primary) hypertension: Secondary | ICD-10-CM | POA: Diagnosis not present

## 2022-06-10 NOTE — Assessment & Plan Note (Signed)
Has had partial response to pyridostigmine Will be starting mycophenalate 500 bid when pharmacy gets it Did restart the prednisone (no hallucinations this time)

## 2022-06-10 NOTE — Assessment & Plan Note (Addendum)
BP Readings from Last 3 Encounters:  06/10/22 130/80  06/05/22 (!) 182/53  05/21/22 140/76   Repeat by me 160/60 Clearly has white coat component Hasn't had persistent elevations though Will just continue the metoprolol 25 for now

## 2022-06-10 NOTE — Progress Notes (Signed)
Subjective:    Patient ID: Jennifer Schmitt, female    DOB: August 02, 1935, 87 y.o.   MRN: [id number]  HPI Here for follow up of myasthenia gravis and HTN  Has had some mild improvement with mestinon Can pull herself up and shower herself In PT and OT Still with trouble from chest up---can't really use her arms correctly  Dr Tomi Likens is going to start cell cept  Planned IVIG since not tolerating prednisone She didn't want to go to hospital for IVIG---so she is going to try the prednisone again  [facility] with walker in home Able to fix meals Does have some home health  Hasn't felt her atrial fib for a while Hasn't needed the diltiazem or propranolol in a while Current Outpatient Medications on File Prior to Visit  Medication Sig Dispense Refill   acetaminophen (TYLENOL) 500 MG tablet Take 1,000 mg by mouth as needed.     amiodarone (PACERONE) 200 MG tablet Take 1 tablet (200 mg total) by mouth 2 (two) times daily for 7 days, THEN 1 tablet (200 mg total) daily. 90 tablet 3   calcium carbonate (CALCIUM 600) 600 MG TABS tablet Take 1 tablet (600 mg total) by mouth daily. 30 tablet 0   Cholecalciferol (VITAMIN D) 2000 units CAPS Take 2,000 Units by mouth daily.     cyanocobalamin (,VITAMIN B-12,) 1000 MCG/ML injection INJECT 1 ML MONTHLY AS DIRECTED 10 mL 1   diltiazem (CARDIZEM) 30 MG tablet Take 1 tablet (30 mg total) by mouth 3 (three) times daily as needed (atrial fibrillation lasting more than 2 hours). 60 tablet 3   dimenhyDRINATE (DRAMAMINE PO) Take by mouth.     fluticasone (FLONASE) 50 MCG/ACT nasal spray Place 1 spray into both nostrils daily.     folic acid (FOLVITE) 1 MG tablet Take 1 tablet (1 mg total) by mouth daily. 90 tablet 3   hydrocortisone 2.5 % cream Apply topically 3 (three) times daily as needed. 28 g 3   lansoprazole (PREVACID) 15 MG capsule Take 1 capsule (15 mg total) by mouth daily at 12 noon. 30 capsule 5   levocetirizine (XYZAL) 5 MG tablet Take 5 mg by mouth every  evening.     LORazepam (ATIVAN) 0.5 MG tablet TAKE ONE HALF TO 1 TABLET BY MOUTH TWICEDAILY AS NEEDED FOR ANXIETY 30 tablet 0   metoprolol succinate (TOPROL XL) 25 MG 24 hr tablet Take 1 tablet (25 mg total) by mouth daily. 90 tablet 3   mycophenolate (CELLCEPT) 500 MG tablet Take 1 tablet (500 mg total) by mouth 2 (two) times daily. 60 tablet 3   predniSONE (DELTASONE) 20 MG tablet Take 1 tablet (20 mg total) by mouth daily. 30 tablet 5   propranolol (INDERAL) 10 MG tablet Take 1 tablet (10 mg total) by mouth 3 (three) times daily as needed (atrial fibrillation lasting more than 2 hours). 60 tablet 6   pyridostigmine (MESTINON) 60 MG tablet Take 1 tablet at 7 AM, 1 tablet at 1 PM and 1 tablet at 7 PM 90 tablet 5   rivaroxaban (XARELTO) 20 MG TABS tablet TAKE 1 TABLET BY MOUTH EVERY DAY WITH THE EVENING MEAL 90 tablet 1   rosuvastatin (CRESTOR) 5 MG tablet Take 1 tablet (5 mg total) by mouth daily. 90 tablet 3   Trolamine Salicylate (ASPERCREME EX) Apply topically as needed.     valsartan (DIOVAN) 80 MG tablet TAKE 1 TABLET(80 MG) BY MOUTH DAILY 90 tablet 3   No current facility-administered medications  on file prior to visit.    Allergies  Allergen Reactions   Actonel [Risedronate Sodium] Nausea And Vomiting   Boniva [Ibandronic Acid] Nausea And Vomiting   Diclofenac Other (See Comments)    Voltaren Gel aggravated her sinus passages   Fosamax [Alendronate Sodium] Nausea And Vomiting   Lipitor [Atorvastatin] Other (See Comments)    Muscle aches. Tolerates Crestor.   Miacalcin [Calcitonin ([place])] Other (See Comments)    Sneezing    Myrbetriq [Mirabegron] Other (See Comments)    Gastritis   Talwin [Pentazocine] Other (See Comments)    Hallucinations    Latex Rash    Sneezing, watery eyes.   Niaspan [Niacin Er] Swelling and Rash    Past Medical History:  Diagnosis Date   Adenomatous colon polyp    Allergic rhinitis due to pollen    Anemia    Anxiety    Bronchiectasis (HCC)     Complication of anesthesia    Degenerative disc disease    cervical and lumbar   Dysrhythmia    GERD (gastroesophageal reflux disease)    Hearing loss in right ear    Heart murmur    HOH (hard of hearing)    AIDS   Hyperlipidemia    Hypertension    Osteoarthritis, multiple sites    PAF (paroxysmal atrial fibrillation) ([place])    a. 01/2013 Echo [facility]): EF 60-65%, no rwma, mildly dil LA/RA, mild AI/TR, trace MR, nl RV size/fxn, mild PAH; b. CHA2DS2VASc = 4-->Chronic Xarelto.   PMR (polymyalgia rheumatica) (HCC)    PONV (postoperative nausea and vomiting)    Rheumatic fever    Sleep apnea    NO CPAP   Urge incontinence    Vertigo    Vertigo     Past Surgical History:  Procedure Laterality Date   ABDOMINAL HYSTERECTOMY     ANKLE FRACTURE SURGERY Left 1996   BACK SURGERY     BREAST BIOPSY Left 2010   negative   CATARACT EXTRACTION W/PHACO Left 10/05/2018   Procedure: CATARACT EXTRACTION PHACO AND INTRAOCULAR LENS PLACEMENT ([place]);  Surgeon: Birder Robson, MD;  Location: [facility] ORS;  Service: Ophthalmology;  Laterality: Left;  [place] 00:40 CDE 6.92 fluid pack lot # [id number]      CATARACT EXTRACTION W/PHACO Right 10/26/2018   Procedure: CATARACT EXTRACTION PHACO AND INTRAOCULAR LENS PLACEMENT ([facility]);  Surgeon: Birder Robson, MD;  Location: [facility] ORS;  Service: Ophthalmology;  Laterality: Right;  [place]  00:47 CDE 7.11 Fluid pack lot # [id number]   DEXA  11/09/06   normal   FRACTURE SURGERY     Meningitis  1963   SPINE SURGERY     SYNOVECTOMY Right 1973   elbow   TONSILLECTOMY AND ADENOIDECTOMY      Family History  Problem Relation Age of Onset   Schizophrenia Mother        paranoid   Heart disease Mother        from psych meds   Arthritis Mother    Rheum arthritis Mother    Cancer Father        prostate   Arthritis Father    Diabetes Father    Ovarian cancer Maternal Grandmother    Uterine cancer Maternal Grandmother    Colon cancer Maternal Grandmother     Breast cancer Paternal Aunt 80    Social History   Socioeconomic History   Marital status: Married    Spouse name: Not on file   Number of children: 1   Years of education:  Not on file   Highest education level: Not on file  Occupational History   Occupation: Scientist, physiological and college professor    Comment: [employer]--PhD in Special education  Tobacco Use   Smoking status: Never    Passive exposure: Past   Smokeless tobacco: Former  Scientific laboratory technician Use: Never used  Substance and Sexual Activity   Alcohol use: Yes    Comment: OCCAS   Drug use: No   Sexual activity: Not on file  Other Topics Concern   Not on file  Social History Narrative   Retired 2007    1 son in [place] area   Spends part time in [place]      Has living will   Husband then son is health care POA   Not sure about DNR---will keep open resuscitation for now    No tube feedings if cognitively unaware   Right handed               Social Determinants of Health   Financial Resource Strain: Not on file  Food Insecurity: Not on file  Transportation Needs: Not on file  Physical Activity: Not on file  Stress: Not on file  Social Connections: Not on file  Intimate Partner Violence: Not on file   Review of Systems Able to sleep in bed again Uses the neck brace at times still Eating fair---not a lot (some intestinal problems---constipation). Using some OTC fiber and miralax     Objective:   Physical Exam Constitutional:      Appearance: Normal appearance.  Cardiovascular:     Rate and Rhythm: Normal rate and regular rhythm.     Heart sounds: No murmur heard.    No gallop.  Pulmonary:     Effort: Pulmonary effort is normal.     Breath sounds: Normal breath sounds. No wheezing or rales.  Musculoskeletal:     Cervical back: Neck supple.  Lymphadenopathy:     Cervical: No cervical adenopathy.  Neurological:     Mental Status: She is alert.            Assessment & Plan:

## 2022-06-11 DIAGNOSIS — R278 Other lack of coordination: Secondary | ICD-10-CM | POA: Diagnosis not present

## 2022-06-11 DIAGNOSIS — R2681 Unsteadiness on feet: Secondary | ICD-10-CM | POA: Diagnosis not present

## 2022-06-11 DIAGNOSIS — G5702 Lesion of sciatic nerve, left lower limb: Secondary | ICD-10-CM | POA: Diagnosis not present

## 2022-06-11 DIAGNOSIS — G7001 Myasthenia gravis with (acute) exacerbation: Secondary | ICD-10-CM | POA: Diagnosis not present

## 2022-06-11 DIAGNOSIS — R29898 Other symptoms and signs involving the musculoskeletal system: Secondary | ICD-10-CM | POA: Diagnosis not present

## 2022-06-11 DIAGNOSIS — R262 Difficulty in walking, not elsewhere classified: Secondary | ICD-10-CM | POA: Diagnosis not present

## 2022-06-11 DIAGNOSIS — M6281 Muscle weakness (generalized): Secondary | ICD-10-CM | POA: Diagnosis not present

## 2022-06-11 DIAGNOSIS — Z741 Need for assistance with personal care: Secondary | ICD-10-CM | POA: Diagnosis not present

## 2022-06-11 DIAGNOSIS — R4189 Other symptoms and signs involving cognitive functions and awareness: Secondary | ICD-10-CM | POA: Diagnosis not present

## 2022-06-13 ENCOUNTER — Telehealth: Payer: Self-pay | Admitting: Neurology

## 2022-06-13 DIAGNOSIS — R29898 Other symptoms and signs involving the musculoskeletal system: Secondary | ICD-10-CM | POA: Diagnosis not present

## 2022-06-13 DIAGNOSIS — G5702 Lesion of sciatic nerve, left lower limb: Secondary | ICD-10-CM | POA: Diagnosis not present

## 2022-06-13 DIAGNOSIS — R278 Other lack of coordination: Secondary | ICD-10-CM | POA: Diagnosis not present

## 2022-06-13 DIAGNOSIS — R2681 Unsteadiness on feet: Secondary | ICD-10-CM | POA: Diagnosis not present

## 2022-06-13 DIAGNOSIS — Z741 Need for assistance with personal care: Secondary | ICD-10-CM | POA: Diagnosis not present

## 2022-06-13 DIAGNOSIS — R262 Difficulty in walking, not elsewhere classified: Secondary | ICD-10-CM | POA: Diagnosis not present

## 2022-06-13 DIAGNOSIS — R4189 Other symptoms and signs involving cognitive functions and awareness: Secondary | ICD-10-CM | POA: Diagnosis not present

## 2022-06-13 DIAGNOSIS — M6281 Muscle weakness (generalized): Secondary | ICD-10-CM | POA: Diagnosis not present

## 2022-06-13 DIAGNOSIS — G7001 Myasthenia gravis with (acute) exacerbation: Secondary | ICD-10-CM | POA: Diagnosis not present

## 2022-06-16 DIAGNOSIS — M6281 Muscle weakness (generalized): Secondary | ICD-10-CM | POA: Diagnosis not present

## 2022-06-16 DIAGNOSIS — R262 Difficulty in walking, not elsewhere classified: Secondary | ICD-10-CM | POA: Diagnosis not present

## 2022-06-16 DIAGNOSIS — R29898 Other symptoms and signs involving the musculoskeletal system: Secondary | ICD-10-CM | POA: Diagnosis not present

## 2022-06-16 DIAGNOSIS — R2681 Unsteadiness on feet: Secondary | ICD-10-CM | POA: Diagnosis not present

## 2022-06-16 DIAGNOSIS — G5702 Lesion of sciatic nerve, left lower limb: Secondary | ICD-10-CM | POA: Diagnosis not present

## 2022-06-16 DIAGNOSIS — R4189 Other symptoms and signs involving cognitive functions and awareness: Secondary | ICD-10-CM | POA: Diagnosis not present

## 2022-06-16 DIAGNOSIS — G7001 Myasthenia gravis with (acute) exacerbation: Secondary | ICD-10-CM | POA: Diagnosis not present

## 2022-06-16 DIAGNOSIS — Z741 Need for assistance with personal care: Secondary | ICD-10-CM | POA: Diagnosis not present

## 2022-06-16 DIAGNOSIS — R278 Other lack of coordination: Secondary | ICD-10-CM | POA: Diagnosis not present

## 2022-06-20 DIAGNOSIS — Z741 Need for assistance with personal care: Secondary | ICD-10-CM | POA: Diagnosis not present

## 2022-06-20 DIAGNOSIS — M6281 Muscle weakness (generalized): Secondary | ICD-10-CM | POA: Diagnosis not present

## 2022-06-20 DIAGNOSIS — R29898 Other symptoms and signs involving the musculoskeletal system: Secondary | ICD-10-CM | POA: Diagnosis not present

## 2022-06-20 DIAGNOSIS — G5702 Lesion of sciatic nerve, left lower limb: Secondary | ICD-10-CM | POA: Diagnosis not present

## 2022-06-20 DIAGNOSIS — R2681 Unsteadiness on feet: Secondary | ICD-10-CM | POA: Diagnosis not present

## 2022-06-20 DIAGNOSIS — G7001 Myasthenia gravis with (acute) exacerbation: Secondary | ICD-10-CM | POA: Diagnosis not present

## 2022-06-20 DIAGNOSIS — R278 Other lack of coordination: Secondary | ICD-10-CM | POA: Diagnosis not present

## 2022-06-20 DIAGNOSIS — R4189 Other symptoms and signs involving cognitive functions and awareness: Secondary | ICD-10-CM | POA: Diagnosis not present

## 2022-06-20 DIAGNOSIS — R262 Difficulty in walking, not elsewhere classified: Secondary | ICD-10-CM | POA: Diagnosis not present

## 2022-06-20 NOTE — Progress Notes (Unsigned)
Cardiology Office Note  Date:  06/23/2022   ID:  Jennifer Schmitt, DOB June 22, 1935, MRN [id number]  PCP:  Venia Carbon, MD   Chief Complaint  Patient presents with   3 month follow up     Discuss Echo, and Zio monitor. Medications reviewed by the patient's pill packets.     HPI:  Jennifer Schmitt is a very pleasant 87 year old woman with history of  paroxysmal atrial fibrillation,  obstructive sleep apnea who does not wear CPAP,  hypertension,  hyperlipidemia  High burdern PVCs who presents for routine followup of her atrial fibrillation, PVCs  LOV 3/23 Seen by EP 05/21/22: started on amiodarone for PAF, PVCs  Viral URI earlier in the year Dx with myasthenia gravis, past ferw months, seen by Dr. Tomi Likens Weakness in her back and legs, core, neck brace on today Presents in wheelchair Recent falls, no injury  On prednisone/cellcept, declined IVIG Doing OT and PT Feels she is slowly improving  Denies any tachypalpitations concerning for atrial fibrillation or PVCs  Significant stress taking care of her husband who has dementia  EKG personally reviewed by myself on todays visit Normal sinus rhythm rate 59 bpm rare PVCs  Echo 4/23 reviewed in detail on today's visit 1. Left ventricular ejection fraction, by estimation, is 55 to 60%. The  left ventricle has normal function. The left ventricle has no regional  wall motion abnormalities. Left ventricular diastolic parameters are  consistent with Grade I diastolic  dysfunction (impaired relaxation). The average left ventricular global  longitudinal strain is -18.4 %.   2. Right ventricular systolic function is normal. The right ventricular  size is normal. There is mildly elevated pulmonary artery systolic  pressure. The estimated right ventricular systolic pressure is 60.6 mmHg.   3. The mitral valve is normal in structure. No evidence of mitral valve  regurgitation. No evidence of mitral stenosis.   4. The aortic valve is  tricuspid. Aortic valve regurgitation is mild.  Aortic valve sclerosis/calcification is present, without any evidence of  aortic stenosis.   5. The inferior vena cava is normal in size with greater than 50%   Event monitor reviewed on today's visit Patch Wear Time:  3 days and 0 hours (2023-03-30T17:20:10-399 to 2023-04-02T17:33:05-399)   Normal sinus rhythm Patient had a min HR of 49 bpm, max HR of 164 bpm, and avg HR of 73 bpm   3 Supraventricular Tachycardia runs occurred, the run with the fastest interval lasting 5 beats with a max rate of 164 bpm, the longest lasting 5 beats with an avg rate of 114 bpm.   Isolated SVEs were rare (<1.0%), SVE Couplets were rare (<1.0%), and SVE Triplets were rare (<1.0%).    Isolated VEs were frequent (22.5%, 71540), VE Couplets were rare (<1.0%, 165), and VE Triplets were rare (<1.0%, 2). Ventricular Bigeminy and Trigeminy were present.    Triggered events associated with normal sinus rhythm with frequent PVCs  Family history Parents with no CAD, no MI Grandmother with CHF  Other past medical history reviewed Carotid u/s 2011: intimal thickening She reports that she had recent screening, was told she had no significant carotid disease. She will bring in the report for our review   05/31/2015 had severe vomiting, went to the emergency room Electrolytes were normal, no atrial fibrillation Lab work reviewed with her today from recent lab draw, total cholesterol 160 B-12 improving, sedimentation rate 40   She reports having an episode of atrial fibrillation in August 2015 It lasted  approximately 6 hours. She took diltiazem and propranolol.  she converted back to normal sinus rhythm  PMH:   has a past medical history of Adenomatous colon polyp, Allergic rhinitis due to pollen, Anemia, Anxiety, Bronchiectasis ([place]), Complication of anesthesia, Degenerative disc disease, Dysrhythmia, GERD (gastroesophageal reflux disease), Hearing loss in right  ear, Heart murmur, HOH (hard of hearing), Hyperlipidemia, Hypertension, Osteoarthritis, multiple sites, PAF (paroxysmal atrial fibrillation) ([place]), PMR (polymyalgia rheumatica) (HCC), PONV (postoperative nausea and vomiting), Rheumatic fever, Sleep apnea, Urge incontinence, Vertigo, and Vertigo.  PSH:    Past Surgical History:  Procedure Laterality Date   ABDOMINAL HYSTERECTOMY     ANKLE FRACTURE SURGERY Left 1996   BACK SURGERY     BREAST BIOPSY Left 2010   negative   CATARACT EXTRACTION W/PHACO Left 10/05/2018   Procedure: CATARACT EXTRACTION PHACO AND INTRAOCULAR LENS PLACEMENT ([place]);  Surgeon: Birder Robson, MD;  Location: [facility] ORS;  Service: Ophthalmology;  Laterality: Left;  [place] 00:40 CDE 6.92 fluid pack lot # [id number]      CATARACT EXTRACTION W/PHACO Right 10/26/2018   Procedure: CATARACT EXTRACTION PHACO AND INTRAOCULAR LENS PLACEMENT ([facility]);  Surgeon: Birder Robson, MD;  Location: [facility] ORS;  Service: Ophthalmology;  Laterality: Right;  [place]  00:47 CDE 7.11 Fluid pack lot # [id number]   DEXA  11/09/06   normal   FRACTURE SURGERY     Meningitis  1963   SPINE SURGERY     SYNOVECTOMY Right 1973   elbow   TONSILLECTOMY AND ADENOIDECTOMY      Current Outpatient Medications  Medication Sig Dispense Refill   acetaminophen (TYLENOL) 500 MG tablet Take 1,000 mg by mouth as needed.     amiodarone (PACERONE) 200 MG tablet Take 200 mg by mouth daily.     calcium carbonate (CALCIUM 600) 600 MG TABS tablet Take 1 tablet (600 mg total) by mouth daily. 30 tablet 0   Cholecalciferol (VITAMIN D) 2000 units CAPS Take 2,000 Units by mouth daily.     cyanocobalamin (,VITAMIN B-12,) 1000 MCG/ML injection INJECT 1 ML MONTHLY AS DIRECTED 10 mL 1   diltiazem (CARDIZEM) 30 MG tablet Take 1 tablet (30 mg total) by mouth 3 (three) times daily as needed (atrial fibrillation lasting more than 2 hours). 60 tablet 3   dimenhyDRINATE (DRAMAMINE PO) Take by mouth.     fluticasone (FLONASE) 50 MCG/ACT  nasal spray Place 1 spray into both nostrils daily.     folic acid (FOLVITE) 1 MG tablet Take 1 tablet (1 mg total) by mouth daily. 90 tablet 3   lansoprazole (PREVACID) 15 MG capsule Take 1 capsule (15 mg total) by mouth daily at 12 noon. 30 capsule 5   LORazepam (ATIVAN) 0.5 MG tablet TAKE ONE HALF TO 1 TABLET BY MOUTH TWICEDAILY AS NEEDED FOR ANXIETY 30 tablet 0   metoprolol succinate (TOPROL XL) 25 MG 24 hr tablet Take 1 tablet (25 mg total) by mouth daily. 90 tablet 3   Multiple Vitamins-Minerals (PRESERVISION AREDS 2 PO) Take by mouth daily.     mycophenolate (CELLCEPT) 500 MG tablet Take 1 tablet (500 mg total) by mouth 2 (two) times daily. 60 tablet 3   predniSONE (DELTASONE) 20 MG tablet Take 1 tablet (20 mg total) by mouth daily. 30 tablet 5   pyridostigmine (MESTINON) 60 MG tablet Take 1 tablet at 7 AM, 1 tablet at 1 PM and 1 tablet at 7 PM 90 tablet 5   rivaroxaban (XARELTO) 20 MG TABS tablet TAKE 1 TABLET BY MOUTH EVERY DAY  WITH THE EVENING MEAL 90 tablet 1   rosuvastatin (CRESTOR) 5 MG tablet Take 1 tablet (5 mg total) by mouth daily. 90 tablet 3   Trolamine Salicylate (ASPERCREME EX) Apply topically as needed.     valsartan (DIOVAN) 80 MG tablet Take 40 mg by mouth daily.     hydrocortisone 2.5 % cream Apply topically 3 (three) times daily as needed. (Patient not taking: Reported on 06/23/2022) 28 g 3   levocetirizine (XYZAL) 5 MG tablet Take 5 mg by mouth every evening. (Patient not taking: Reported on 06/23/2022)     propranolol (INDERAL) 10 MG tablet Take 10 mg by mouth 3 (three) times daily. daily as needed (for breakthrough atrial fibrillation) (Patient not taking: Reported on 06/23/2022)     No current facility-administered medications for this visit.    Allergies:   Actonel [risedronate sodium], Boniva [ibandronic acid], Diclofenac, Fosamax [alendronate sodium], Lipitor [atorvastatin], Miacalcin [calcitonin (salmon)], Myrbetriq [mirabegron], Talwin [pentazocine], Latex, and  Niaspan [niacin er]   Social History:  The patient  reports that she has never smoked. She has been exposed to tobacco smoke. She has quit using smokeless tobacco. She reports current alcohol use. She reports that she does not use drugs.   Family History:   family history includes Arthritis in her father and mother; Breast cancer (age of onset: 5) in her paternal aunt; Cancer in her father; Colon cancer in her maternal grandmother; Diabetes in her father; Heart disease in her mother; Ovarian cancer in her maternal grandmother; Rheum arthritis in her mother; Schizophrenia in her mother; Uterine cancer in her maternal grandmother.    Review of Systems: Review of Systems  Constitutional: Negative.   Respiratory: Negative.    Cardiovascular: Negative.   Gastrointestinal: Negative.   Musculoskeletal: Negative.   Neurological: Negative.   Psychiatric/Behavioral: Negative.    All other systems reviewed and are negative.   PHYSICAL EXAM: VS:  BP 120/70 (BP Location: Left Arm, Patient Position: Sitting, Cuff Size: Normal)   Pulse (!) 59   Ht '5\' 5"'$  (1.651 m)   Wt 164 lb (74.4 kg)   SpO2 98%   BMI 27.29 kg/m  , BMI Body mass index is 27.29 kg/m. Constitutional:  oriented to person, place, and time. No distress.  HENT:  Head: Grossly normal Eyes:  no discharge. No scleral icterus.  Neck: No JVD, no carotid bruits  Cardiovascular: Regular rate and rhythm, no murmurs appreciated Pulmonary/Chest: Clear to auscultation bilaterally, no wheezes or rails Abdominal: Soft.  no distension.  no tenderness.  Musculoskeletal: Normal range of motion Neurological:  normal muscle tone. Coordination normal. No atrophy Skin: Skin warm and dry Psychiatric: normal affect, pleasant  Recent Labs: 03/04/2022: Hemoglobin 12.6 04/23/2022: Platelets 309 06/04/2022: ALT 35; BUN 13; Creatinine, Ser 0.54; Potassium 4.2; Sodium 142; TSH 1.668   Lipid Panel Lab Results  Component Value Date   CHOL 146 11/29/2021    HDL 59.70 11/29/2021   LDLCALC 68 11/29/2021   TRIG 92.0 11/29/2021    Wt Readings from Last 3 Encounters:  06/23/22 164 lb (74.4 kg)  06/10/22 172 lb (78 kg)  06/05/22 170 lb (77.1 kg)     ASSESSMENT AND PLAN:  Paroxysmal atrial fibrillation (HCC) -  Maintaining normal sinus rhythm Continue metoprolol at 25 mg daily, amiodarone 200 daily Also has diltiazem 30 or propranolol 10 she can take as needed for breakthrough arrhythmia  Frequent PVCs High PVC burden on event monitor, normal ejection fraction Seen by EP, on amiodarone, metoprolol No sx  Essential hypertension - Plan: EKG 12-Lead Blood pressure is well controlled on today's visit. No changes made to the medications.  Shortness of breath/fatigue/depression Workiing with PT and OT  Pure hypercholesterolemia On Crestor 5 daily  Myasthenia Gravis On cellcept, prednisone   Total encounter time more than 30 minutes  Greater than 50% was spent in counseling and coordination of care with the patient   No orders of the defined types were placed in this encounter.    Signed, Esmond Plants, M.D., Ph.D. 06/23/2022  [place], [place]

## 2022-06-23 ENCOUNTER — Ambulatory Visit: Payer: Medicare PPO | Admitting: Cardiovascular Disease

## 2022-06-23 VITALS — BP 120/70 | HR 59 | Ht 65.0 in | Wt 164.0 lb

## 2022-06-23 DIAGNOSIS — E782 Mixed hyperlipidemia: Secondary | ICD-10-CM

## 2022-06-23 DIAGNOSIS — M353 Polymyalgia rheumatica: Secondary | ICD-10-CM | POA: Diagnosis not present

## 2022-06-23 DIAGNOSIS — I493 Ventricular premature depolarization: Secondary | ICD-10-CM

## 2022-06-23 DIAGNOSIS — I4819 Other persistent atrial fibrillation: Secondary | ICD-10-CM

## 2022-06-23 DIAGNOSIS — I10 Essential (primary) hypertension: Secondary | ICD-10-CM

## 2022-06-23 DIAGNOSIS — R0602 Shortness of breath: Secondary | ICD-10-CM | POA: Diagnosis not present

## 2022-06-23 DIAGNOSIS — E7800 Pure hypercholesterolemia, unspecified: Secondary | ICD-10-CM | POA: Diagnosis not present

## 2022-06-23 MED ORDER — DILTIAZEM HCL 30 MG PO TABS: 30.0000 mg | ORAL_TABLET | Freq: Three times a day (TID) | ORAL | 3 refills | Status: AC | PRN

## 2022-06-23 MED ORDER — PROPRANOLOL HCL 10 MG PO TABS: 10.0000 mg | ORAL_TABLET | Freq: Three times a day (TID) | ORAL | 1 refills | Status: DC

## 2022-06-23 NOTE — Patient Instructions (Signed)
Medication Instructions:  No changes  If you need a refill on your cardiac medications before your next appointment, please call your pharmacy.   Lab work: No new labs needed  Testing/Procedures: No new testing needed  Follow-Up: At [facility], you and your health needs are our priority.  As part of our continuing mission to provide you with exceptional heart care, we have created designated Provider Care Teams.  These Care Teams include your primary Cardiologist (physician) and Advanced Practice Providers (APPs -  Physician Assistants and Nurse Practitioners) who all work together to provide you with the care you need, when you need it.  You will need a follow up appointment in 12 months  Providers on your designated Care Team:   Christopher Berge, NP Ryan Dunn, PA-C Cadence Furth, PA-C  COVID-19 Vaccine Information can be found at: [email or web address] For questions related to vaccine distribution or appointments, please email [email or web address] or call [phone].   

## 2022-06-24 DIAGNOSIS — G5702 Lesion of sciatic nerve, left lower limb: Secondary | ICD-10-CM | POA: Diagnosis not present

## 2022-06-24 DIAGNOSIS — G7001 Myasthenia gravis with (acute) exacerbation: Secondary | ICD-10-CM | POA: Diagnosis not present

## 2022-06-24 DIAGNOSIS — R29898 Other symptoms and signs involving the musculoskeletal system: Secondary | ICD-10-CM | POA: Diagnosis not present

## 2022-06-24 DIAGNOSIS — R4189 Other symptoms and signs involving cognitive functions and awareness: Secondary | ICD-10-CM | POA: Diagnosis not present

## 2022-06-24 DIAGNOSIS — R262 Difficulty in walking, not elsewhere classified: Secondary | ICD-10-CM | POA: Diagnosis not present

## 2022-06-24 DIAGNOSIS — M6281 Muscle weakness (generalized): Secondary | ICD-10-CM | POA: Diagnosis not present

## 2022-06-24 DIAGNOSIS — R2681 Unsteadiness on feet: Secondary | ICD-10-CM | POA: Diagnosis not present

## 2022-06-24 DIAGNOSIS — R278 Other lack of coordination: Secondary | ICD-10-CM | POA: Diagnosis not present

## 2022-06-24 DIAGNOSIS — Z741 Need for assistance with personal care: Secondary | ICD-10-CM | POA: Diagnosis not present

## 2022-06-26 DIAGNOSIS — R2681 Unsteadiness on feet: Secondary | ICD-10-CM | POA: Diagnosis not present

## 2022-06-26 DIAGNOSIS — M6281 Muscle weakness (generalized): Secondary | ICD-10-CM | POA: Diagnosis not present

## 2022-06-26 DIAGNOSIS — R4189 Other symptoms and signs involving cognitive functions and awareness: Secondary | ICD-10-CM | POA: Diagnosis not present

## 2022-06-26 DIAGNOSIS — R278 Other lack of coordination: Secondary | ICD-10-CM | POA: Diagnosis not present

## 2022-06-26 DIAGNOSIS — R262 Difficulty in walking, not elsewhere classified: Secondary | ICD-10-CM | POA: Diagnosis not present

## 2022-06-26 DIAGNOSIS — R29898 Other symptoms and signs involving the musculoskeletal system: Secondary | ICD-10-CM | POA: Diagnosis not present

## 2022-06-26 DIAGNOSIS — G7001 Myasthenia gravis with (acute) exacerbation: Secondary | ICD-10-CM | POA: Diagnosis not present

## 2022-06-26 DIAGNOSIS — G5702 Lesion of sciatic nerve, left lower limb: Secondary | ICD-10-CM | POA: Diagnosis not present

## 2022-06-26 DIAGNOSIS — Z741 Need for assistance with personal care: Secondary | ICD-10-CM | POA: Diagnosis not present

## 2022-06-30 DIAGNOSIS — R29898 Other symptoms and signs involving the musculoskeletal system: Secondary | ICD-10-CM | POA: Diagnosis not present

## 2022-06-30 DIAGNOSIS — G7001 Myasthenia gravis with (acute) exacerbation: Secondary | ICD-10-CM | POA: Diagnosis not present

## 2022-06-30 DIAGNOSIS — G5702 Lesion of sciatic nerve, left lower limb: Secondary | ICD-10-CM | POA: Diagnosis not present

## 2022-06-30 DIAGNOSIS — M6281 Muscle weakness (generalized): Secondary | ICD-10-CM | POA: Diagnosis not present

## 2022-06-30 DIAGNOSIS — R262 Difficulty in walking, not elsewhere classified: Secondary | ICD-10-CM | POA: Diagnosis not present

## 2022-06-30 DIAGNOSIS — R2681 Unsteadiness on feet: Secondary | ICD-10-CM | POA: Diagnosis not present

## 2022-06-30 DIAGNOSIS — R278 Other lack of coordination: Secondary | ICD-10-CM | POA: Diagnosis not present

## 2022-06-30 DIAGNOSIS — Z741 Need for assistance with personal care: Secondary | ICD-10-CM | POA: Diagnosis not present

## 2022-06-30 DIAGNOSIS — R4189 Other symptoms and signs involving cognitive functions and awareness: Secondary | ICD-10-CM | POA: Diagnosis not present

## 2022-07-02 DIAGNOSIS — R278 Other lack of coordination: Secondary | ICD-10-CM | POA: Diagnosis not present

## 2022-07-02 DIAGNOSIS — Z741 Need for assistance with personal care: Secondary | ICD-10-CM | POA: Diagnosis not present

## 2022-07-02 DIAGNOSIS — R4189 Other symptoms and signs involving cognitive functions and awareness: Secondary | ICD-10-CM | POA: Diagnosis not present

## 2022-07-02 DIAGNOSIS — R29898 Other symptoms and signs involving the musculoskeletal system: Secondary | ICD-10-CM | POA: Diagnosis not present

## 2022-07-02 DIAGNOSIS — M6281 Muscle weakness (generalized): Secondary | ICD-10-CM | POA: Diagnosis not present

## 2022-07-02 DIAGNOSIS — R2681 Unsteadiness on feet: Secondary | ICD-10-CM | POA: Diagnosis not present

## 2022-07-02 DIAGNOSIS — G5702 Lesion of sciatic nerve, left lower limb: Secondary | ICD-10-CM | POA: Diagnosis not present

## 2022-07-02 DIAGNOSIS — R262 Difficulty in walking, not elsewhere classified: Secondary | ICD-10-CM | POA: Diagnosis not present

## 2022-07-02 DIAGNOSIS — G7001 Myasthenia gravis with (acute) exacerbation: Secondary | ICD-10-CM | POA: Diagnosis not present

## 2022-07-04 ENCOUNTER — Telehealth: Payer: Self-pay

## 2022-07-04 DIAGNOSIS — R262 Difficulty in walking, not elsewhere classified: Secondary | ICD-10-CM | POA: Diagnosis not present

## 2022-07-04 DIAGNOSIS — M6281 Muscle weakness (generalized): Secondary | ICD-10-CM | POA: Diagnosis not present

## 2022-07-04 DIAGNOSIS — Z741 Need for assistance with personal care: Secondary | ICD-10-CM | POA: Diagnosis not present

## 2022-07-04 DIAGNOSIS — R2681 Unsteadiness on feet: Secondary | ICD-10-CM | POA: Diagnosis not present

## 2022-07-04 DIAGNOSIS — G7000 Myasthenia gravis without (acute) exacerbation: Secondary | ICD-10-CM

## 2022-07-04 DIAGNOSIS — R278 Other lack of coordination: Secondary | ICD-10-CM | POA: Diagnosis not present

## 2022-07-04 DIAGNOSIS — R29898 Other symptoms and signs involving the musculoskeletal system: Secondary | ICD-10-CM | POA: Diagnosis not present

## 2022-07-04 DIAGNOSIS — G7001 Myasthenia gravis with (acute) exacerbation: Secondary | ICD-10-CM | POA: Diagnosis not present

## 2022-07-04 DIAGNOSIS — R4189 Other symptoms and signs involving cognitive functions and awareness: Secondary | ICD-10-CM | POA: Diagnosis not present

## 2022-07-04 DIAGNOSIS — G5702 Lesion of sciatic nerve, left lower limb: Secondary | ICD-10-CM | POA: Diagnosis not present

## 2022-07-04 NOTE — Telephone Encounter (Signed)
Telephone call from [facility], Patient needs to reschedule her appt with Dr.Jaffe on 07/17/22.  Advised the rep it looks like Dr.Jaffe has her scheduled for every weeks with him until Dr.Hill Appt in September. As well as Labs CBC,CMP) weekly for 4 weeks and then every 3 moths.  Rep did not know about the labs.  To get patient on track with her labs and help her be seen with Dr.Jaffe patient reschedule to 08/11/22 and lab orders faxed to [facility] via [place].  Advised that patient was advised by opthalmology of referral to Rheumatology. Order will be sent out next week.   Per Dr.Jaffe she is okay to be rescheduled.

## 2022-07-07 ENCOUNTER — Other Ambulatory Visit: Payer: Self-pay

## 2022-07-07 DIAGNOSIS — R2681 Unsteadiness on feet: Secondary | ICD-10-CM | POA: Diagnosis not present

## 2022-07-07 DIAGNOSIS — M6281 Muscle weakness (generalized): Secondary | ICD-10-CM | POA: Diagnosis not present

## 2022-07-07 DIAGNOSIS — G5702 Lesion of sciatic nerve, left lower limb: Secondary | ICD-10-CM | POA: Diagnosis not present

## 2022-07-07 DIAGNOSIS — G7000 Myasthenia gravis without (acute) exacerbation: Secondary | ICD-10-CM

## 2022-07-07 DIAGNOSIS — G7001 Myasthenia gravis with (acute) exacerbation: Secondary | ICD-10-CM | POA: Diagnosis not present

## 2022-07-07 DIAGNOSIS — R29898 Other symptoms and signs involving the musculoskeletal system: Secondary | ICD-10-CM | POA: Diagnosis not present

## 2022-07-07 DIAGNOSIS — R278 Other lack of coordination: Secondary | ICD-10-CM | POA: Diagnosis not present

## 2022-07-07 DIAGNOSIS — R4189 Other symptoms and signs involving cognitive functions and awareness: Secondary | ICD-10-CM | POA: Diagnosis not present

## 2022-07-07 DIAGNOSIS — R262 Difficulty in walking, not elsewhere classified: Secondary | ICD-10-CM | POA: Diagnosis not present

## 2022-07-07 DIAGNOSIS — Z741 Need for assistance with personal care: Secondary | ICD-10-CM | POA: Diagnosis not present

## 2022-07-09 ENCOUNTER — Telehealth: Payer: Self-pay | Admitting: Neurology

## 2022-07-09 DIAGNOSIS — G5702 Lesion of sciatic nerve, left lower limb: Secondary | ICD-10-CM | POA: Diagnosis not present

## 2022-07-09 DIAGNOSIS — R4189 Other symptoms and signs involving cognitive functions and awareness: Secondary | ICD-10-CM | POA: Diagnosis not present

## 2022-07-09 DIAGNOSIS — Z741 Need for assistance with personal care: Secondary | ICD-10-CM | POA: Diagnosis not present

## 2022-07-09 DIAGNOSIS — R29898 Other symptoms and signs involving the musculoskeletal system: Secondary | ICD-10-CM | POA: Diagnosis not present

## 2022-07-09 DIAGNOSIS — G7001 Myasthenia gravis with (acute) exacerbation: Secondary | ICD-10-CM | POA: Diagnosis not present

## 2022-07-09 DIAGNOSIS — M6281 Muscle weakness (generalized): Secondary | ICD-10-CM | POA: Diagnosis not present

## 2022-07-09 DIAGNOSIS — R278 Other lack of coordination: Secondary | ICD-10-CM | POA: Diagnosis not present

## 2022-07-09 DIAGNOSIS — R2681 Unsteadiness on feet: Secondary | ICD-10-CM | POA: Diagnosis not present

## 2022-07-09 DIAGNOSIS — R262 Difficulty in walking, not elsewhere classified: Secondary | ICD-10-CM | POA: Diagnosis not present

## 2022-07-09 NOTE — Telephone Encounter (Signed)
Jennifer Schmitt from [facility] called back. She stated we can leave the information on her voicemail if she cannot answer.

## 2022-07-09 NOTE — Telephone Encounter (Signed)
Called and delivered message.

## 2022-07-09 NOTE — Telephone Encounter (Signed)
[facility] called back no answer left a voice mail to call back

## 2022-07-09 NOTE — Telephone Encounter (Signed)
[facility] called. The RN wants to know how many times the patient needs to get labs done. The remaining occurrences. [phone]

## 2022-07-10 DIAGNOSIS — G7000 Myasthenia gravis without (acute) exacerbation: Secondary | ICD-10-CM | POA: Diagnosis not present

## 2022-07-14 DIAGNOSIS — Z741 Need for assistance with personal care: Secondary | ICD-10-CM | POA: Diagnosis not present

## 2022-07-14 DIAGNOSIS — R4189 Other symptoms and signs involving cognitive functions and awareness: Secondary | ICD-10-CM | POA: Diagnosis not present

## 2022-07-14 DIAGNOSIS — R278 Other lack of coordination: Secondary | ICD-10-CM | POA: Diagnosis not present

## 2022-07-14 DIAGNOSIS — M6281 Muscle weakness (generalized): Secondary | ICD-10-CM | POA: Diagnosis not present

## 2022-07-14 DIAGNOSIS — G5702 Lesion of sciatic nerve, left lower limb: Secondary | ICD-10-CM | POA: Diagnosis not present

## 2022-07-14 DIAGNOSIS — G7001 Myasthenia gravis with (acute) exacerbation: Secondary | ICD-10-CM | POA: Diagnosis not present

## 2022-07-14 DIAGNOSIS — R2681 Unsteadiness on feet: Secondary | ICD-10-CM | POA: Diagnosis not present

## 2022-07-14 DIAGNOSIS — R262 Difficulty in walking, not elsewhere classified: Secondary | ICD-10-CM | POA: Diagnosis not present

## 2022-07-14 DIAGNOSIS — R29898 Other symptoms and signs involving the musculoskeletal system: Secondary | ICD-10-CM | POA: Diagnosis not present

## 2022-07-17 ENCOUNTER — Ambulatory Visit: Payer: Medicare PPO | Admitting: Neurology

## 2022-07-17 DIAGNOSIS — G7000 Myasthenia gravis without (acute) exacerbation: Secondary | ICD-10-CM | POA: Diagnosis not present

## 2022-07-18 DIAGNOSIS — G7001 Myasthenia gravis with (acute) exacerbation: Secondary | ICD-10-CM | POA: Diagnosis not present

## 2022-07-18 DIAGNOSIS — M6281 Muscle weakness (generalized): Secondary | ICD-10-CM | POA: Diagnosis not present

## 2022-07-18 DIAGNOSIS — R29898 Other symptoms and signs involving the musculoskeletal system: Secondary | ICD-10-CM | POA: Diagnosis not present

## 2022-07-18 DIAGNOSIS — Z741 Need for assistance with personal care: Secondary | ICD-10-CM | POA: Diagnosis not present

## 2022-07-18 DIAGNOSIS — R2681 Unsteadiness on feet: Secondary | ICD-10-CM | POA: Diagnosis not present

## 2022-07-18 DIAGNOSIS — R4189 Other symptoms and signs involving cognitive functions and awareness: Secondary | ICD-10-CM | POA: Diagnosis not present

## 2022-07-18 DIAGNOSIS — G5702 Lesion of sciatic nerve, left lower limb: Secondary | ICD-10-CM | POA: Diagnosis not present

## 2022-07-18 DIAGNOSIS — R262 Difficulty in walking, not elsewhere classified: Secondary | ICD-10-CM | POA: Diagnosis not present

## 2022-07-18 DIAGNOSIS — R278 Other lack of coordination: Secondary | ICD-10-CM | POA: Diagnosis not present

## 2022-07-21 DIAGNOSIS — R4189 Other symptoms and signs involving cognitive functions and awareness: Secondary | ICD-10-CM | POA: Diagnosis not present

## 2022-07-21 DIAGNOSIS — G5702 Lesion of sciatic nerve, left lower limb: Secondary | ICD-10-CM | POA: Diagnosis not present

## 2022-07-21 DIAGNOSIS — R262 Difficulty in walking, not elsewhere classified: Secondary | ICD-10-CM | POA: Diagnosis not present

## 2022-07-21 DIAGNOSIS — M6281 Muscle weakness (generalized): Secondary | ICD-10-CM | POA: Diagnosis not present

## 2022-07-21 DIAGNOSIS — Z741 Need for assistance with personal care: Secondary | ICD-10-CM | POA: Diagnosis not present

## 2022-07-21 DIAGNOSIS — R2681 Unsteadiness on feet: Secondary | ICD-10-CM | POA: Diagnosis not present

## 2022-07-21 DIAGNOSIS — R29898 Other symptoms and signs involving the musculoskeletal system: Secondary | ICD-10-CM | POA: Diagnosis not present

## 2022-07-21 DIAGNOSIS — R278 Other lack of coordination: Secondary | ICD-10-CM | POA: Diagnosis not present

## 2022-07-21 DIAGNOSIS — G7001 Myasthenia gravis with (acute) exacerbation: Secondary | ICD-10-CM | POA: Diagnosis not present

## 2022-07-24 DIAGNOSIS — G7000 Myasthenia gravis without (acute) exacerbation: Secondary | ICD-10-CM | POA: Diagnosis not present

## 2022-07-25 ENCOUNTER — Telehealth: Payer: Self-pay | Admitting: Internal Medicine

## 2022-07-25 MED ORDER — ROSUVASTATIN CALCIUM 5 MG PO TABS: 5.0000 mg | ORAL_TABLET | Freq: Every day | ORAL | 3 refills | Status: DC

## 2022-07-25 NOTE — Telephone Encounter (Signed)
Caller Name: Jennifer Schmitt Call back phone #: [phone]  MEDICATION(S): rosuvastatin (CRESTOR) 5 MG tablet  Days of Med Remaining: a little bit  Has the patient contacted their pharmacy (YES/NO)? Yes What did pharmacy advise? They told her to call us, they are trying to fill her pill pack  Preferred Pharmacy: [facility]  ~~~Please advise patient/caregiver to allow 2-3 business days to process RX refills.

## 2022-07-25 NOTE — Telephone Encounter (Signed)
Rx sent electronically.  

## 2022-07-31 DIAGNOSIS — G7000 Myasthenia gravis without (acute) exacerbation: Secondary | ICD-10-CM | POA: Diagnosis not present

## 2022-08-04 DIAGNOSIS — H353131 Nonexudative age-related macular degeneration, bilateral, early dry stage: Secondary | ICD-10-CM | POA: Diagnosis not present

## 2022-08-11 ENCOUNTER — Ambulatory Visit: Payer: Medicare PPO | Admitting: Neurology

## 2022-08-15 ENCOUNTER — Ambulatory Visit: Payer: Medicare PPO | Attending: Internal Medicine | Admitting: Internal Medicine

## 2022-08-15 ENCOUNTER — Encounter: Payer: Self-pay | Admitting: Internal Medicine

## 2022-08-15 VITALS — BP 168/78 | HR 60 | Resp 14 | Ht 66.0 in | Wt 159.4 lb

## 2022-08-15 DIAGNOSIS — G7000 Myasthenia gravis without (acute) exacerbation: Secondary | ICD-10-CM

## 2022-08-15 DIAGNOSIS — M353 Polymyalgia rheumatica: Secondary | ICD-10-CM | POA: Diagnosis not present

## 2022-08-15 NOTE — Progress Notes (Signed)
 Office Visit Note  Patient: Jennifer Schmitt             Date of Birth: 10/06/1935           MRN: [id number]             PCP: Letvak, Richard I, MD Referring: Hampton, Blake Maxwell,* Visit Date: 08/15/2022   Subjective:  New Patient (Initial Visit) (Referred by specialist, possibly for PMR.)   History of Present Illness: Jennifer Schmitt is a 87 y.o. female here for history of PMR concern for possible GCA with elevated ESR and CRP. She is not tolerated oral steroids well previously for her PMR with side effects including hallucinations and psychiatric disturbance.  She was recently diagnosed with seropositive myasthenia gravis earlier this year with findings including head drop, weakness, and ptosis.  Initially started treatment with Mestinon and prednisone and did appear to be able to tolerate at low doses.  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  So was starting on CellCept for maintenance immunosuppression with neurology she was seeing Dr. Jaffe.  Some concern for giant cell arteritis due to elevated serum inflammatory markers headaches and eye and vision problems although these have been attributed to her myasthenia.  Currently the symptoms are actually improved and doing pretty well.  She is still having some difficulty with weakness or stiffness and the proximal legs worse on the left side.  The frontal or temporal type headache problems are improved.   Activities of Daily Living:  Patient reports morning stiffness for 0 minutes.   Patient Denies nocturnal pain.  Difficulty dressing/grooming: Denies Difficulty climbing stairs: Reports Difficulty getting out of chair: Denies Difficulty using hands for taps, buttons, cutlery, and/or writing: Reports  Review of Systems  Constitutional:  Negative for fatigue.  HENT:  Negative for mouth sores and mouth dryness.   Eyes:   Negative for dryness.  Respiratory:  Negative for shortness of breath.   Cardiovascular:  Negative for chest pain and palpitations.  Gastrointestinal:  Positive for constipation and diarrhea. Negative for blood in stool.  Endocrine: Positive for increased urination.  Genitourinary:  Positive for involuntary urination.  Musculoskeletal:  Positive for gait problem. Negative for joint pain, joint pain, joint swelling, myalgias, muscle weakness, muscle tenderness and myalgias.  Skin:  Positive for color change and sensitivity to sunlight. Negative for rash and hair loss.  Allergic/Immunologic: Negative for susceptible to infections.  Neurological:  Negative for dizziness and headaches.  Hematological:  Negative for swollen glands.  Psychiatric/Behavioral:  Positive for depressed mood and sleep disturbance. The patient is nervous/anxious.     PMFS History:  Patient Active Problem List   Diagnosis Date Noted   Myasthenia gravis (HCC) 05/06/2022   Visual changes 05/04/2022   Proximal muscle weakness 04/28/2022   Decubitus ulcer of left buttock, stage 2 (HCC) 04/28/2022   Urinary frequency 03/04/2022   Fatigue 03/04/2022   Acute non-recurrent maxillary sinusitis 01/22/2022   Change in bowel habits 06/26/2021   MCI (mild cognitive impairment) 11/09/2019   Vaginal atrophy 11/23/2017   Overactive bladder 11/23/2017   Neuropathy 10/15/2016   Schwannoma 06/05/2015   Pernicious anemia 04/11/2015   Mild mood disorder (HCC) 04/11/2015   Visit for preventive health examination 10/10/2014   Advanced directives, counseling/discussion 10/10/2014   Obstructive sleep apnea 04/20/2014   Hypertension    Hyperlipidemia    GERD (gastroesophageal reflux disease)      Allergic rhinitis due to pollen    Atrial fibrillation (HCC)    PMR (polymyalgia rheumatica) (HCC)    Osteoarthritis, multiple sites    Degenerative disc disease     Past Medical History:  Diagnosis Date   Adenomatous colon polyp     Allergic rhinitis due to pollen    Anemia    Anxiety    Bronchiectasis ([place])    Complication of anesthesia    Degenerative disc disease    cervical and lumbar   Dysrhythmia    GERD (gastroesophageal reflux disease)    Hearing loss in right ear    Heart murmur    HOH (hard of hearing)    AIDS   Hyperlipidemia    Hypertension    Osteoarthritis, multiple sites    PAF (paroxysmal atrial fibrillation) ([place])    a. 01/2013 [facility]): EF 60-65%, no rwma, mildly dil LA/RA, mild AI/TR, trace MR, nl RV size/fxn, mild PAH; b. CHA2DS2VASc = 4-->Chronic Xarelto.   PMR (polymyalgia rheumatica) (HCC)    PONV (postoperative nausea and vomiting)    Rheumatic fever    Sleep apnea    NO CPAP   Urge incontinence    Vertigo    Vertigo     Family History  Problem Relation Age of Onset   Schizophrenia Mother        paranoid   Heart disease Mother        from psych meds   Arthritis Mother    Rheum arthritis Mother    Cancer Father        prostate   Arthritis Father    Diabetes Father    Breast cancer Paternal Aunt 28   Ovarian cancer Maternal Grandmother    Uterine cancer Maternal Grandmother    Colon cancer Maternal Grandmother    Healthy Son    Past Surgical History:  Procedure Laterality Date   ABDOMINAL HYSTERECTOMY     ANKLE FRACTURE SURGERY Left 1996   BACK SURGERY     BREAST BIOPSY Left 2010   negative   CATARACT EXTRACTION W/PHACO Left 10/05/2018   Procedure: CATARACT EXTRACTION PHACO AND INTRAOCULAR LENS PLACEMENT ([place]);  Surgeon: Birder Robson, MD;  Location: [facility] ORS;  Service: Ophthalmology;  Laterality: Left;  [place] 00:40 CDE 6.92 fluid pack lot # [id number]      CATARACT EXTRACTION W/PHACO Right 10/26/2018   Procedure: CATARACT EXTRACTION PHACO AND INTRAOCULAR LENS PLACEMENT (I[facility]);  Surgeon: Birder Robson, MD;  Location: [facility] ORS;  Service: Ophthalmology;  Laterality: Right;  [place]  00:47 CDE 7.11 Fluid pack lot # [id number]   DEXA  11/09/06   normal    FRACTURE SURGERY     Meningitis  1963   SPINE SURGERY     SYNOVECTOMY Right 1973   elbow   TONSILLECTOMY AND ADENOIDECTOMY     Social History   Social History Narrative   Retired 2007    1 son in [place] area   Spends part time in [place]      Has living will   Husband then son is health care POA   Not sure about DNR---will keep open resuscitation for now    No tube feedings if cognitively unaware   Right handed               Immunization History  Administered Date(s) Administered   Influenza, High Dose Seasonal PF 09/10/2014, 09/22/2016, 09/01/2018, 08/24/2019   Influenza,inj,Quad PF,6+ Mos 08/30/2015   Influenza-Unspecified 09/18/2017, 10/12/2020, 09/21/2021   Moderna SARS-COV2 Booster  Vaccination 11/06/2020   Moderna Sars-Covid-2 Vaccination 01/06/2020, 02/04/2020   Pneumococcal Conjugate-13 10/10/2014   Pneumococcal Polysaccharide-23 10/10/2010, 10/21/2017   Td 10/10/2014   Zoster Recombinat (Shingrix) 10/08/2018, 12/07/2018   Zoster, Live 12/22/2010     Objective: Vital Signs: BP (!) 168/78 (BP Location: Right Arm, Patient Position: Sitting, Cuff Size: Normal)   Pulse 60   Resp 14   Ht 5' 6" (1.676 m)   Wt 159 lb 6.4 oz (72.3 kg)   BMI 25.73 kg/m    Physical Exam Cardiovascular:     Rate and Rhythm: Normal rate and regular rhythm.  Pulmonary:     Effort: Pulmonary effort is normal.     Breath sounds: Normal breath sounds.  Musculoskeletal:     Right lower leg: No edema.     Left lower leg: No edema.  Skin:    General: Skin is warm and dry.     Findings: Bruising present.  Neurological:     Mental Status: She is alert.     Gait: Gait normal.     Comments: Hip flexion 4+/5 on the left side otherwise normal proximal muscle strength in upper lower extremities  Psychiatric:        Mood and Affect: Mood normal.      Musculoskeletal Exam:  Shoulders full ROM no tenderness or swelling Elbows full ROM no tenderness or swelling Wrists full ROM no  tenderness or swelling Fingers full ROM no tenderness or swelling Knees full ROM no tenderness or swelling Ankles full ROM no tenderness or swelling   Investigation: No additional findings.  Imaging: No results found.  Recent Labs: Lab Results  Component Value Date   WBC 12.8 (H) 08/27/2022   HGB 12.3 08/27/2022   PLT 267 08/27/2022   NA 139 08/27/2022   K 4.6 08/27/2022   CL 104 08/27/2022   CO2 27 08/27/2022   GLUCOSE 113 (H) 08/27/2022   BUN 25 (H) 08/27/2022   CREATININE 0.74 08/27/2022   BILITOT 0.7 08/27/2022   ALKPHOS 42 08/27/2022   AST 21 08/27/2022   ALT 30 08/27/2022   PROT 6.8 08/27/2022   ALBUMIN 3.9 08/27/2022   CALCIUM 9.2 08/27/2022   GFRAA >60 05/31/2015    Speciality Comments: No specialty comments available.  Procedures:  No procedures performed Allergies: Actonel [risedronate sodium], Boniva [ibandronic acid], Diclofenac, Fosamax [alendronate sodium], Lipitor [atorvastatin], Miacalcin [calcitonin (salmon)], Myrbetriq [mirabegron], Talwin [pentazocine], Latex, and Niaspan [niacin er]   Assessment / Plan:     Visit Diagnoses: PMR (polymyalgia rheumatica) (HCC) - Plan: Sedimentation rate, C-reactive protein  Currently symptoms appear pretty minimal although she has been started on moderate dose of steroids and immunosuppression for her myasthenia gravis.  I am more suspicious for this known autoimmune disease to be an alternate explanation for inflammatory markers and symptoms including visual changes with or without headache.  No jaw claudication or other concerning features.  We will recheck the sedimentation rate and CRP today to see if these remain elevated.  If normal would not recommend any further investigation unless symptoms start to become unmasked or worse when tapering steroids for other indication.  Myasthenia gravis (HCC)  Established with neurology symptoms appear fairly well improved at this time on the current steroid dose and is  working on long-term maintenance medication regimen.  Orders: Orders Placed This Encounter  Procedures   Sedimentation rate   C-reactive protein   No orders of the defined types were placed in this encounter.    Follow-Up Instructions: No   follow-ups on file.   Collier Salina, MD  Note - This record has been created using [employer].  Chart creation errors have been sought, but may not always  have been located. Such creation errors do not reflect on  the standard of medical care.

## 2022-08-16 LAB — SEDIMENTATION RATE: Sed Rate: 14 mm/h (ref 0–30)

## 2022-08-16 LAB — C-REACTIVE PROTEIN: CRP: 0.8 mg/L (ref <8.0)

## 2022-08-17 NOTE — Progress Notes (Signed)
Inflammatory markers that were concerning for GCA have become completely normal on her current medications. I do not think we need to follow up in rheumatology clinic unless additional concerns from her other providers.

## 2022-08-19 ENCOUNTER — Other Ambulatory Visit: Payer: Self-pay | Admitting: Cardiovascular Disease

## 2022-08-19 NOTE — Telephone Encounter (Signed)
Prescription refill request for Xarelto received.  Indication:Afib Last office visit:7/23 Weight:72.3 kg Age:87 Scr:0.5 CrCl:95.6 ml/min  Prescription refilled

## 2022-08-19 NOTE — Telephone Encounter (Signed)
Refill request

## 2022-08-26 ENCOUNTER — Other Ambulatory Visit: Payer: Self-pay | Admitting: Neurology

## 2022-08-26 ENCOUNTER — Other Ambulatory Visit: Payer: Self-pay | Admitting: Internal Medicine

## 2022-08-26 NOTE — Progress Notes (Unsigned)
Electrophysiology Office Follow up Visit Note:    Date:  08/27/2022   ID:  [name], DOB 1935-10-15, MRN [id number]  PCP:  Venia Carbon, MD  [facility] Cardiologist:  None  [facility] Electrophysiologist:  None    Interval History:    DEJON JUNGMAN is a 87 y.o. female who presents for a follow up visit. They were last seen in clinic May 21, 2022 for PVCs and atrial fibrillation.  The patient is followed by Dr. Rockey Situ.  She also carries a diagnosis of myasthenia gravis.  Amiodarone was added to her regimen of metoprolol at the last appointment.  She saw Dr. Rockey Situ in follow-up June 23, 2022.  She was doing well at that appointment and reported improvement in both her atrial fibrillation and PVCs.     Past Medical History:  Diagnosis Date   Adenomatous colon polyp    Allergic rhinitis due to pollen    Anemia    Anxiety    Bronchiectasis (HCC)    Complication of anesthesia    Degenerative disc disease    cervical and lumbar   Dysrhythmia    GERD (gastroesophageal reflux disease)    Hearing loss in right ear    Heart murmur    HOH (hard of hearing)    AIDS   Hyperlipidemia    Hypertension    Osteoarthritis, multiple sites    PAF (paroxysmal atrial fibrillation) ([place])    a. 01/2013 Echo [facility]): EF 60-65%, no rwma, mildly dil LA/RA, mild AI/TR, trace MR, nl RV size/fxn, mild PAH; b. CHA2DS2VASc = 4-->Chronic Xarelto.   PMR (polymyalgia rheumatica) (HCC)    PONV (postoperative nausea and vomiting)    Rheumatic fever    Sleep apnea    NO CPAP   Urge incontinence    Vertigo    Vertigo     Past Surgical History:  Procedure Laterality Date   ABDOMINAL HYSTERECTOMY     ANKLE FRACTURE SURGERY Left 1996   BACK SURGERY     BREAST BIOPSY Left 2010   negative   CATARACT EXTRACTION W/PHACO Left 10/05/2018   Procedure: CATARACT EXTRACTION PHACO AND INTRAOCULAR LENS PLACEMENT ([place]);  Surgeon: Birder Robson, MD;  Location: [facility] ORS;  Service:  Ophthalmology;  Laterality: Left;  [place] 00:40 CDE 6.92 fluid pack lot # [id number]      CATARACT EXTRACTION W/PHACO Right 10/26/2018   Procedure: CATARACT EXTRACTION PHACO AND INTRAOCULAR LENS PLACEMENT (IOC);  Surgeon: Birder Robson, MD;  Location: [facility] ORS;  Service: Ophthalmology;  Laterality: Right;  [place]  00:47 CDE 7.11 Fluid pack lot # [id number]   DEXA  11/09/06   normal   FRACTURE SURGERY     Meningitis  1963   SPINE SURGERY     SYNOVECTOMY Right 1973   elbow   TONSILLECTOMY AND ADENOIDECTOMY      Current Medications: Current Meds  Medication Sig   acetaminophen (TYLENOL) 500 MG tablet Take 1,000 mg by mouth as needed.   amiodarone (PACERONE) 200 MG tablet Take 200 mg by mouth daily.   calcium carbonate (CALCIUM 600) 600 MG TABS tablet Take 1 tablet (600 mg total) by mouth daily.   Cholecalciferol (VITAMIN D) 2000 units CAPS Take 2,000 Units by mouth daily.   cyanocobalamin (,VITAMIN B-12,) 1000 MCG/ML injection INJECT 1 ML MONTHLY AS DIRECTED   diltiazem (CARDIZEM) 30 MG tablet Take 1 tablet (30 mg total) by mouth 3 (three) times daily as needed (atrial fibrillation lasting more than 2 hours).  dimenhyDRINATE (DRAMAMINE PO) Take by mouth.   fluticasone (FLONASE) 50 MCG/ACT nasal spray Place 1 spray into both nostrils daily.   folic acid (FOLVITE) 1 MG tablet Take 1 tablet (1 mg total) by mouth daily.   hydrocortisone 2.5 % cream Apply topically 3 (three) times daily as needed.   lansoprazole (PREVACID) 15 MG capsule Take 1 capsule (15 mg total) by mouth daily at 12 noon.   LORazepam (ATIVAN) 0.5 MG tablet TAKE ONE HALF TO 1 TABLET BY MOUTH TWICEDAILY AS NEEDED FOR ANXIETY   metoprolol succinate (TOPROL XL) 25 MG 24 hr tablet Take 1 tablet (25 mg total) by mouth daily.   Multiple Vitamins-Minerals (PRESERVISION AREDS 2 PO) Take by mouth daily.   mycophenolate (CELLCEPT) 500 MG tablet Take 1 tablet (500 mg total) by mouth 2 (two) times daily.   predniSONE (DELTASONE) 20 MG  tablet Take 1 tablet (20 mg total) by mouth daily.   propranolol (INDERAL) 10 MG tablet Take 1 tablet (10 mg total) by mouth 3 (three) times daily. daily as needed (for breakthrough atrial fibrillation)   pyridostigmine (MESTINON) 60 MG tablet Take 1 tablet at 7 AM, 1 tablet at 1 PM and 1 tablet at 7 PM   rosuvastatin (CRESTOR) 5 MG tablet Take 1 tablet (5 mg total) by mouth daily.   Trolamine Salicylate (ASPERCREME EX) Apply topically as needed.   valsartan (DIOVAN) 80 MG tablet Take 40 mg by mouth daily.   XARELTO 20 MG TABS tablet TAKE 1 TABLET BY MOUTH ONCE A DAY WITH THE EVENING MEAL.     Allergies:   Actonel [risedronate sodium], Boniva [ibandronic acid], Diclofenac, Fosamax [alendronate sodium], Lipitor [atorvastatin], Miacalcin [calcitonin (salmon)], Myrbetriq [mirabegron], Talwin [pentazocine], Latex, and Niaspan [niacin er]   Social History   Socioeconomic History   Marital status: Married    Spouse name: Not on file   Number of children: 1   Years of education: Not on file   Highest education level: Not on file  Occupational History   Occupation: Scientist, physiological and college professor    Comment: [employer]--PhD in Special education  Tobacco Use   Smoking status: Never    Passive exposure: Never   Smokeless tobacco: Never  Vaping Use   Vaping Use: Never used  Substance and Sexual Activity   Alcohol use: Yes    Comment: OCCAS   Drug use: No   Sexual activity: Not on file  Other Topics Concern   Not on file  Social History Narrative   Retired 2007    1 son in [place] area   Spends part time in [place]      Has living will   Husband then son is health care POA   Not sure about DNR---will keep open resuscitation for now    No tube feedings if cognitively unaware   Right handed               Social Determinants of Health   Financial Resource Strain: Not on file  Food Insecurity: Not on file  Transportation Needs: Not on file  Physical Activity: Not on  file  Stress: Not on file  Social Connections: Not on file     Family History: The patient's family history includes Arthritis in her father and mother; Breast cancer (age of onset: 20) in her paternal aunt; Cancer in her father; Colon cancer in her maternal grandmother; Diabetes in her father; Healthy in her son; Heart disease in her mother; Ovarian cancer in her maternal grandmother;  Rheum arthritis in her mother; Schizophrenia in her mother; Uterine cancer in her maternal grandmother.  ROS:   Please see the history of present illness.    All other systems reviewed and are negative.  EKGs/Labs/Other Studies Reviewed:    The following studies were reviewed today:  June 23, 2022 EKG shows sinus rhythm with rare PVCs.  June 04, 2022 lab work shows an AST of 28, ALT 35, TSH 1.6 and a free T4 of 0.95.  EKG:  The ekg ordered today demonstrates sinus rhythm with a first-degree AV delay  Recent Labs: 03/04/2022: Hemoglobin 12.6 04/23/2022: Platelets 309 06/04/2022: ALT 35; BUN 13; Creatinine, Ser 0.54; Potassium 4.2; Sodium 142; TSH 1.668  Recent Lipid Panel    Component Value Date/Time   CHOL 146 11/29/2021 0840   TRIG 92.0 11/29/2021 0840   HDL 59.70 11/29/2021 0840   CHOLHDL 2 11/29/2021 0840   VLDL 18.4 11/29/2021 0840   LDLCALC 68 11/29/2021 0840    Physical Exam:    VS:  BP 116/72   Pulse (!) 54   Ht '5\' 6"'$  (1.676 m)   Wt 159 lb 9.6 oz (72.4 kg)   SpO2 97%   BMI 25.76 kg/m     Wt Readings from Last 3 Encounters:  08/27/22 159 lb 9.6 oz (72.4 kg)  08/15/22 159 lb 6.4 oz (72.3 kg)  06/23/22 164 lb (74.4 kg)     GEN:  Well nourished, well developed in no acute distress HEENT: Normal.  Soft c-collar in place NECK: No JVD; No carotid bruits LYMPHATICS: No lymphadenopathy CARDIAC: RRR, no murmurs, rubs, gallops RESPIRATORY:  Clear to auscultation without rales, wheezing or rhonchi  ABDOMEN: Soft, non-tender, non-distended MUSCULOSKELETAL:  No edema; No deformity   SKIN: Warm and dry NEUROLOGIC:  Alert and oriented x 3 PSYCHIATRIC:  Normal affect        ASSESSMENT:    1. Persistent atrial fibrillation ([place])   2. Myasthenia gravis ([place])   3. Encounter for long-term (current) use of high-risk medication    PLAN:    In order of problems listed above:   #Persistent atrial fibrillation #Frequent PVCs Much improved on amiodarone.  Last blood work in June shows stable liver and thyroid function.  I will recheck liver and thyroid function during today's appointment.  If those values are okay, plan to see her back in 6 months with repeat blood work at that time.  APP appointment will be okay for that visit.  Follow-up 6 months or sooner as needed.  APP appointment.  Total time spent with patient today 30 minutes. This includes reviewing records, evaluating the patient and coordinating care.   Medication Adjustments/Labs and Tests Ordered: Current medicines are reviewed at length with the patient today.  Concerns regarding medicines are outlined above.  No orders of the defined types were placed in this encounter.  No orders of the defined types were placed in this encounter.    Signed, Lars Mage, MD, [facility], [facility] 08/27/2022 11:01 AM    Electrophysiology [facility]

## 2022-08-26 NOTE — Telephone Encounter (Signed)
Last filled 05-22-22 #30 Last OV 05-06-22 Next OV 12-03-22 [place]

## 2022-08-27 ENCOUNTER — Encounter: Payer: Self-pay | Admitting: Cardiology

## 2022-08-27 ENCOUNTER — Ambulatory Visit: Payer: Medicare PPO | Attending: Cardiology | Admitting: Cardiology

## 2022-08-27 ENCOUNTER — Other Ambulatory Visit
Admission: RE | Admit: 2022-08-27 | Discharge: 2022-08-27 | Disposition: A | Discharge status: Home / Self Care | Payer: Medicare PPO | Source: Ambulatory Visit | Attending: Cardiology | Admitting: Cardiology

## 2022-08-27 VITALS — BP 116/72 | HR 54 | Ht 66.0 in | Wt 159.6 lb

## 2022-08-27 DIAGNOSIS — Z79899 Other long term (current) drug therapy: Secondary | ICD-10-CM | POA: Diagnosis not present

## 2022-08-27 DIAGNOSIS — G7000 Myasthenia gravis without (acute) exacerbation: Secondary | ICD-10-CM

## 2022-08-27 DIAGNOSIS — I4819 Other persistent atrial fibrillation: Secondary | ICD-10-CM | POA: Diagnosis not present

## 2022-08-27 DIAGNOSIS — G4700 Insomnia, unspecified: Secondary | ICD-10-CM | POA: Insufficient documentation

## 2022-08-27 LAB — COMPREHENSIVE METABOLIC PANEL
ALT: 30 U/L (ref 0–44)
AST: 21 U/L (ref 15–41)
Albumin: 3.9 g/dL (ref 3.5–5.0)
Alkaline Phosphatase: 42 U/L (ref 38–126)
Anion gap: 8 (ref 5–15)
BUN: 25 mg/dL — ABNORMAL HIGH (ref 8–23)
CO2: 27 mmol/L (ref 22–32)
Calcium: 9.2 mg/dL (ref 8.9–10.3)
Chloride: 104 mmol/L (ref 98–111)
Creatinine, Ser: 0.74 mg/dL (ref 0.44–1.00)
GFR, Estimated: >60 mL/min (ref >60)
Glucose, Bld: 113 mg/dL — ABNORMAL HIGH (ref 70–99)
Potassium: 4.6 mmol/L (ref 3.5–5.1)
Sodium: 139 mmol/L (ref 135–145)
Total Bilirubin: 0.7 mg/dL (ref 0.3–1.2)
Total Protein: 6.8 g/dL (ref 6.5–8.1)

## 2022-08-27 LAB — CBC
HCT: 37.9 % (ref 36.0–46.0)
Hemoglobin: 12.3 g/dL (ref 12.0–15.0)
MCH: 31.7 pg (ref 26.0–34.0)
MCHC: 32.5 g/dL (ref 30.0–36.0)
MCV: 97.7 fL (ref 80.0–100.0)
Platelets: 267 10*3/uL (ref 150–400)
RBC: 3.88 MIL/uL (ref 3.87–5.11)
RDW: 14.5 % (ref 11.5–15.5)
WBC: 12.8 10*3/uL — ABNORMAL HIGH (ref 4.0–10.5)
nRBC: 0 % (ref 0.0–0.2)

## 2022-08-27 LAB — T4, FREE: Free T4: 1.15 ng/dL — ABNORMAL HIGH (ref 0.61–1.12)

## 2022-08-27 LAB — TSH: TSH: 1.183 u[IU]/mL (ref 0.350–4.500)

## 2022-08-27 NOTE — Patient Instructions (Signed)
Medication Instructions:  none *If you need a refill on your cardiac medications before your next appointment, please call your pharmacy*   Lab Work: CBC, CMP, TSH, Free T4 If you have labs (blood work) drawn today and your tests are completely normal, you will receive your results only by: [place] (if you have MyChart) OR A paper copy in the mail If you have any lab test that is abnormal or we need to change your treatment, we will call you to review the results.   Testing/Procedures: none   Follow-Up: At [facility], you and your health needs are our priority.  As part of our continuing mission to provide you with exceptional heart care, we have created designated Provider Care Teams.  These Care Teams include your primary Cardiologist (physician) and Advanced Practice Providers (APPs -  Physician Assistants and Nurse Practitioners) who all work together to provide you with the care you need, when you need it.  We recommend signing up for the patient portal called "MyChart".  Sign up information is provided on this After Visit Summary.  MyChart is used to connect with patients for Virtual Visits (Telemedicine).  Patients are able to view lab/test results, encounter notes, upcoming appointments, etc.  Non-urgent messages can be sent to your provider as well.   To learn more about what you can do with MyChart, go to [email or web address].    Your next appointment:   6 month(s)  The format for your next appointment:   In Person  Provider:   You will see one of the following Advanced Practice Providers on your designated Care Team:   Murray Hodgkins, NP Christell Faith, PA-C Cadence Kathlen Mody, PA-C Gerrie Nordmann, NP      Other Instructions none  Important Information About Sugar

## 2022-08-27 NOTE — Telephone Encounter (Signed)
Refill enough until seen by Dr. Berdine Addison per Dr. Tomi Likens 09/05/2022

## 2022-08-28 DIAGNOSIS — G7000 Myasthenia gravis without (acute) exacerbation: Secondary | ICD-10-CM | POA: Diagnosis not present

## 2022-09-02 DIAGNOSIS — H02403 Unspecified ptosis of bilateral eyelids: Secondary | ICD-10-CM | POA: Diagnosis not present

## 2022-09-02 NOTE — Progress Notes (Signed)
Initial neurology clinic note  SERVICE DATE: 09/05/22 SERVICE TIME: 11:00 am  Reason for Evaluation: Consultation requested by Pieter Partridge, DO for an opinion regarding seropositive myasthenia gravis. My final recommendations will be communicated back to the requesting physician by way of shared medical record or letter to requesting physician via [place] mail.  HPI: This is Ms. [name], a 87 y.o. right-handed female with a medical history of seropositive myasthenia gravis, polymyalgia rheumatica, HTN, HLD, pAfib, and OA who presents to neurology clinic following up for myasthenia gravis. The patient is alone today.  Patient was previously seen in this office by Dr. Tomi Likens (last seen on 06/05/22). Per initial clinic note on 05/08/22: Since January-February 2023, she has had progressive weakness.  She began having trouble rolling over in bed.  She was unable to get up and off her bed so she started sleeping on the couch..  Noted weakness in the left leg.  Seems to be dragging her right leg.  When standing, always with sensation that legs will give out. She has had falls and unable to stand up, requiring to call EMS.  Notes particular weakness in the proximal arms and trouble keeping her head up.  Started relying on a cane more frequently and then wheelchair.  She also endorsed worsening musculoskeletal pain with pain from the neck down her spine to her sacrum.  She does have degenerative spine disease and polymyalgia rheumatica.  MRI of brain with and without contrast on 04/27/2022 personally reviewed showed moderate chronic small vessel ischemic changes and known 1 x 2 mm vestibular schwannoma within the right internal auditory canal, stable compared to prior imaging in 2017.  She started noticing that her left eyelid was drooping.  She went to the ophthalmologist who suspected temporal arteritis given her history of PMR.  No vision loss.  Sed rate and CRP on 04/23/2022 were 101 and 10.1 respectively  and was started on prednisone 50m daily with some improvement.  Based on strong likelihood of giant cell arteritis with elevated inflammatory markers and having PMR, temporal artery biopsy was not pursued.  She was started on prednisone 625mdaily but discontinued after 3 days due to side effects.  She also has had increased urinary urgency and sometimes fecal incontinence.  Due to sacral pain following a fall, she had X-ray of lumbar spine and MRI of sacrum which were negative for occult fracture.  AChR Binding Ab on 04/28/2022 was elevated at 28.10.  PCP started her on pyridostigmine 3037mhree times daily.  She notes improvement in strength in the legs.  However, she still has a head drop.  Denies double vision, trouble swallowing, trouble chewing, and trouble taking a breath.  At 05/08/22 visit, Dr. JafTomi Likenscommended prednisone 20 mg daily, mestinon 60 mg TID (7 am, 1 pm, 7 pm). CT chest on 05/15/22 was negative for mediastinal mass (no evidence of thymoma). Patient discontinued prednisone shortly after starting as it made her jittery and not able to sleep. At follow up with Dr. JafTomi Likens 06/05/22, patient was not feeling well, but was walking a little better. She still had head drop. Hospital admission for a course of IVIg was suggested to prevent MG crisis, but patient declined as she needed to be home to take care of her husband with Alzheimer's disease. Cellcept 500 mg BID was started with IVIg every 28 days until Cellcept was therapeutic. She was also continued on mestinon. CBC and CMP were set up to be check weekly  for 4 weeks, followed by monthly for 3 months. Patient called the next day though because she did not want IVIg. She decided to restart prednisone instead.  Since her last visit on 06/05/22: She feels much improved today. She is about 75% better than prior visit.  Patient is having a lot of sinus drainage. She has severe constipation, but when she has a bowel movement then will have diarrhea.  She tried miralax but this causes diarrhea.  Patient mentions that she has some numbness and tingling in lateral toes, bilateral 2nd and 5th digits of the hands.  Patient lives at a retirement with healthcare and PT/OT. She has many resources there. Patient went to PT and OT. She is currently done with both. Husband is currently in a memory care unit twice a week.  Current medications: Cellcept 500 mg BID, Prednisone 20 mg once in the morning, Mestinon 60 mg TID (7:30-8am, 12pm, 8-9pm).  Side effects: Cellcept is difficult to take on a empty stomach. She gets a little nausea but this is moderately improved. She has hot flashes or temperature fluctuations that are episodic. She is otherwise not having significant side effects.  Patient typically goes to bed at 9-10 pm.  Current MG symptoms: Ptosis: Some drooping on left, pretty constant; improved compared to prior, worse when tired, not bothersome Double vision: Never Speech: Occasionally slurred speech, especially when tired; no one else commenting on it per patient Chewing: No problems currently Swallowing: Occasionally, episodic difficulty requiring a double swallow; no coughing Breathing: No problems currently Arm strength: No problems; Neck is still weak, but much improved. Cannot sit up straight for long periods of time Leg strength: Walks with a walker. She feels like her legs are strong. She has a walker due to not being able to stand up right.   MEDICATIONS:  Outpatient Encounter Medications as of 09/05/2022  Medication Sig   acetaminophen (TYLENOL) 500 MG tablet Take 1,000 mg by mouth as needed.   amiodarone (PACERONE) 200 MG tablet Take 200 mg by mouth daily.   Cholecalciferol (VITAMIN D) 2000 units CAPS Take 2,000 Units by mouth daily.   cyanocobalamin (,VITAMIN B-12,) 1000 MCG/ML injection INJECT 1 ML MONTHLY AS DIRECTED   dimenhyDRINATE (DRAMAMINE PO) Take by mouth.   fluticasone (FLONASE) 50 MCG/ACT nasal spray Place 1  spray into both nostrils daily.   folic acid (FOLVITE) 1 MG tablet Take 1 tablet (1 mg total) by mouth daily.   lansoprazole (PREVACID) 15 MG capsule Take 1 capsule (15 mg total) by mouth daily at 12 noon.   metoprolol succinate (TOPROL XL) 25 MG 24 hr tablet Take 1 tablet (25 mg total) by mouth daily.   Multiple Vitamins-Minerals (PRESERVISION AREDS 2 PO) Take by mouth daily.   mycophenolate (CELLCEPT) 500 MG tablet Take 1 tablet (500 mg total) by mouth 2 (two) times daily.   predniSONE (DELTASONE) 20 MG tablet TAKE 1 TABLET BY MOUTH DAILY   pyridostigmine (MESTINON) 60 MG tablet Take 1 tablet at 7 AM, 1 tablet at 1 PM and 1 tablet at 7 PM   rosuvastatin (CRESTOR) 5 MG tablet Take 1 tablet (5 mg total) by mouth daily.   valsartan (DIOVAN) 80 MG tablet Take 40 mg by mouth daily.   XARELTO 20 MG TABS tablet TAKE 1 TABLET BY MOUTH ONCE A DAY WITH THE EVENING MEAL.   calcium carbonate (CALCIUM 600) 600 MG TABS tablet Take 1 tablet (600 mg total) by mouth daily. (Patient not taking: Reported on 09/05/2022)  diltiazem (CARDIZEM) 30 MG tablet Take 1 tablet (30 mg total) by mouth 3 (three) times daily as needed (atrial fibrillation lasting more than 2 hours). (Patient not taking: Reported on 09/05/2022)   hydrocortisone 2.5 % cream Apply topically 3 (three) times daily as needed. (Patient not taking: Reported on 09/05/2022)   LORazepam (ATIVAN) 0.5 MG tablet TAKE ONE HALF TO 1 TABLET BY MOUTH TWICEDAILY AS NEEDED FOR ANXIETY (Patient not taking: Reported on 09/05/2022)   propranolol (INDERAL) 10 MG tablet Take 1 tablet (10 mg total) by mouth 3 (three) times daily. daily as needed (for breakthrough atrial fibrillation) (Patient not taking: Reported on 06/14/7627)   Trolamine Salicylate (ASPERCREME EX) Apply topically as needed. (Patient not taking: Reported on 09/05/2022)   No facility-administered encounter medications on file as of 09/05/2022.    PAST MEDICAL HISTORY: Past Medical History:  Diagnosis Date    Adenomatous colon polyp    Allergic rhinitis due to pollen    Anemia    Anxiety    Bronchiectasis ([place])    Complication of anesthesia    Degenerative disc disease    cervical and lumbar   Dysrhythmia    GERD (gastroesophageal reflux disease)    Hearing loss in right ear    Heart murmur    HOH (hard of hearing)    AIDS   Hyperlipidemia    Hypertension    Osteoarthritis, multiple sites    PAF (paroxysmal atrial fibrillation) ([place])    a. 01/2013 [facility]): EF 60-65%, no rwma, mildly dil LA/RA, mild AI/TR, trace MR, nl RV size/fxn, mild PAH; b. CHA2DS2VASc = 4-->Chronic Xarelto.   PMR (polymyalgia rheumatica) ([facility])    PONV (postoperative nausea and vomiting)    Rheumatic fever    Sleep apnea    NO CPAP   Urge incontinence    Vertigo    Vertigo     PAST SURGICAL HISTORY: Past Surgical History:  Procedure Laterality Date   ABDOMINAL HYSTERECTOMY     ANKLE FRACTURE SURGERY Left 1996   BACK SURGERY     BREAST BIOPSY Left 2010   negative   CATARACT EXTRACTION W/PHACO Left 10/05/2018   Procedure: CATARACT EXTRACTION PHACO AND INTRAOCULAR LENS PLACEMENT ([place]);  Surgeon: Birder Robson, MD;  Location: [facility] ORS;  Service: Ophthalmology;  Laterality: Left;  [place] 00:40 CDE 6.92 fluid pack lot # [id number]      CATARACT EXTRACTION W/PHACO Right 10/26/2018   Procedure: CATARACT EXTRACTION PHACO AND INTRAOCULAR LENS PLACEMENT ([facility]);  Surgeon: Birder Robson, MD;  Location: [facility] ORS;  Service: Ophthalmology;  Laterality: Right;  [place]  00:47 CDE 7.11 Fluid pack lot # [id number]   DEXA  11/09/06   normal   FRACTURE SURGERY     Meningitis  1963   SPINE SURGERY     SYNOVECTOMY Right 1973   elbow   TONSILLECTOMY AND ADENOIDECTOMY      ALLERGIES: Allergies  Allergen Reactions   Actonel [Risedronate Sodium] Nausea And Vomiting   Boniva [Ibandronic Acid] Nausea And Vomiting   Diclofenac Other (See Comments)    Voltaren Gel aggravated her sinus passages   Fosamax  [Alendronate Sodium] Nausea And Vomiting   Lipitor [Atorvastatin] Other (See Comments)    Muscle aches. Tolerates Crestor.   Miacalcin [Calcitonin ([place])] Other (See Comments)    Sneezing    Myrbetriq [Mirabegron] Other (See Comments)    Gastritis   Talwin [Pentazocine] Other (See Comments)    Hallucinations    Latex Rash    Sneezing, watery eyes.  Niaspan [Niacin Er] Swelling and Rash    FAMILY HISTORY: Family History  Problem Relation Age of Onset   Schizophrenia Mother        paranoid   Heart disease Mother        from psych meds   Arthritis Mother    Rheum arthritis Mother    Cancer Father        prostate   Arthritis Father    Diabetes Father    Breast cancer Paternal Aunt 50   Ovarian cancer Maternal Grandmother    Uterine cancer Maternal Grandmother    Colon cancer Maternal Grandmother    Healthy Son     SOCIAL HISTORY: Social History   Tobacco Use   Smoking status: Never    Passive exposure: Never   Smokeless tobacco: Never  Vaping Use   Vaping Use: Never used  Substance Use Topics   Alcohol use: Yes   Drug use: No   Social History   Social History Narrative   Retired 2007    1 son in [place] area   Spends part time in [place]      Has living will   Husband then son is health care POA   Not sure about DNR---will keep open resuscitation for now    No tube feedings if cognitively unaware   Right handed                 OBJECTIVE: PHYSICAL EXAM: BP (!) 182/72 (BP Location: Right Arm, Patient Position: Sitting, Cuff Size: Small)   Pulse (!) 56   Ht 5' 5"  (1.651 m)   Wt 157 lb (71.2 kg)   SpO2 98%   BMI 26.13 kg/m   General: General appearance: Awake and alert. No distress. Cooperative with exam.  Skin: No obvious rash or jaundice. HEENT: Atraumatic. Anicteric. Lungs: Non-labored breathing on room air  Psych: Affect appropriate.  Neurological: Mental Status: Alert. Speech fluent. No pseudobulbar affect Cranial Nerves: CNII:  No RAPD. Visual fields intact. CNIII, IV, VI: PERRL. No nystagmus. EOMI. Mild difficulty with sustained up gaze CN V: Facial sensation intact bilaterally to fine touch. [place] clench strong. CN VII: Facial muscles symmetric and strong. Eye closure and mouth closure strong. Mild left sided ptosis at rest that worsens after sustained up gaze. CN VIII: Cannot hear in right ear. Left hearing intact CN IX: No hypophonia. CN X: Palate elevates symmetrically. CN XI: Full strength shoulder shrug bilaterally. CN XII: Tongue protrusion full and midline. No atrophy or fasciculations. No significant dysarthria Single breath test: able to count to 18 in a single breath on first attempt; 25-30 on second attempt Motor: Tone is normal. No fasciculations in extremities. No atrophy. No grip or percussive myotonia.  Individual muscle group testing (MRC grade out of 5):  Movement     Neck flexion 5    Neck extension 5     Right Left   Shoulder abduction 5- 5- 4 with repeated testing  Shoulder adduction 5 5   Shoulder ext rotation 5 5   Shoulder int rotation 5 5   Elbow flexion 5 5   Elbow extension 5 5   Wrist extension 5 5   Wrist flexion 5 5   Finger abduction - FDI 5 5   Finger abduction - ADM 4+ 4+   Finger extension 4+ 4+   Finger distal flexion - 2/3 5 5    Finger distal flexion - 4/5 5 5    Thumb flexion - FPL 5 5  Thumb abduction - APB 4+ 4+    Hip flexion 5- 4+   Hip extension 5 5   Hip adduction 5 5   Hip abduction 5 5   Knee extension 5 5   Knee flexion 5 5   Dorsiflexion 5 5   Plantarflexion 5 5   Inversion 5 5   Eversion 5 5   Great toe extension 5 5   Great toe flexion 5 5     Reflexes:  Right Left   Bicep 2+ 2+   Tricep 2+ 2+   BrRad 2+ 2+   Knee 1+ 1+   Ankle 1+ 1+    Pathological Reflexes: Babinski: flexor response bilaterally Hoffman: absent bilaterally Troemner: positive on right, absent on left Sensation: Pinprick: Intact in all  extremities Vibration: Diminished in bilateral LE in a patchy distribution Proprioception: Intact in bilateral great toes Coordination: Intact finger-to- nose-finger bilaterally. Romberg negative. Gait: Able to rise from chair with arms crossed unassisted. Slow, unsteady, waddling gait.  Lab and Test Review: Internal labs: 08/27/22: Normal or unremarkable: CBC, CMP, TSH 08/15/22: Normal ESR and CRP  AChR binding ab: positive at 28.10  CT chest (05/15/22): FINDINGS: Cardiovascular: Moderate aortic atherosclerosis. No aneurysm. Coronary vascular calcification. Normal cardiac size. No pericardial effusion   Mediastinum/Nodes: Midline trachea. No thyroid mass. Subcarinal node measuring 12 mm. Esophagus is normal.   Lungs/Pleura: Mild bronchiectasis in the right middle lobe and bilateral lower lobes with scarring. No acute airspace disease, pleural effusion or pneumothorax.   Upper Abdomen: No acute abnormality.   Musculoskeletal: No chest wall abnormality. No acute or significant osseous findings.   IMPRESSION: 1. Negative for mediastinal mass lesion. 2. Mild bronchiectasis in the right middle and bilateral lower lobes with pulmonary scarring  ASSESSMENT: SONJA MANSEAU is a 87 y.o. female who presents for evaluation of AChR ab positive myasthenia gravis. She has a relevant medical history of polymyalgia rheumatica, HTN, HLD, pAfib, and OA. Her neurological examination is pertinent for ptosis of left eyelid that worsens with sustained up gaze, fatigable weakness in bilateral shoulder abductors and hip flexors. Available diagnostic data is significant for AChR binding ab positive at 28.10 and CT chest negative for thymoma.   Overall, patient is much improved from prior clinic visit on 06/05/22. She has some residual proximal weakness, but overall appears that her symptoms are controlled and improving. I would like to taper off the mestinon to see if we have control of her myasthenia.  If she is able to taper off without return of symptoms, I will begin slowly tapering her steroids.  In terms of her numbness in fingers and feet, I do not have a good explanation currently. She does not have clear signs of polyneuropathy on exam. We will monitor these symptoms as they are currently minimal.  PLAN: -Blood work: will check CMP and CBC at next visit -Continue prednisone 20 mg qam -Continue Cellcept 500 mg BID -Begin tapering mestinon as able:  -Starting today: 60 mg BID (morning and noon)  -Monday, 09/08/22: 60 mg qam  -Thursday, 09/11/22: stop -Patient instructed to restart Mestinon if symptoms return or worsen and to call the office or send a MyChart message -She was given instructions on when to seek emergency medical attention (swallowing or breathing difficulty) -Discussed her elevated BP today. She will check again later at her facility. If elevated, she will contact her PCP  -Return to clinic in 1 month  The impression above as well as the plan as outlined  below were extensively discussed with the patient who voiced understanding. All questions were answered to their satisfaction.  When available, results of the above investigations and possible further recommendations will be communicated to the patient via telephone/MyChart. Patient to call office if not contacted after expected testing turnaround time.   Total time spent reviewing records, interview, history/exam, documentation, and coordination of care on day of encounter:  75 min   Thank you for allowing me to participate in patient's care.  If I can answer any additional questions, I would be pleased to do so.  Kai Levins, MD   CC: Venia Carbon, MD [place] [id number]  CC: Referring provider: Pieter Partridge, DO [address] [place],  [place] [id number]

## 2022-09-05 ENCOUNTER — Encounter: Payer: Self-pay | Admitting: Neurology

## 2022-09-05 ENCOUNTER — Ambulatory Visit: Payer: Medicare PPO | Admitting: Neurology

## 2022-09-05 VITALS — BP 182/72 | HR 56 | Ht 65.0 in | Wt 157.0 lb

## 2022-09-05 DIAGNOSIS — R269 Unspecified abnormalities of gait and mobility: Secondary | ICD-10-CM

## 2022-09-05 DIAGNOSIS — M6281 Muscle weakness (generalized): Secondary | ICD-10-CM | POA: Diagnosis not present

## 2022-09-05 DIAGNOSIS — M5382 Other specified dorsopathies, cervical region: Secondary | ICD-10-CM

## 2022-09-05 DIAGNOSIS — G7000 Myasthenia gravis without (acute) exacerbation: Secondary | ICD-10-CM

## 2022-09-05 DIAGNOSIS — H02403 Unspecified ptosis of bilateral eyelids: Secondary | ICD-10-CM

## 2022-09-05 NOTE — Patient Instructions (Addendum)
Plan for today:  We are going to try to stop the Pyridostigmine. -I would like you to skip your nightly dose starting today. Take 60 mg at breakfast and 60 mg at lunch. -On Monday, 09/08/22, I want you to stop the mid day dose. You will take 60 mg at breakfast only. -On Thursday, 09/11/22, I want you to stop pyridostigmine completely. -If at any point, your symptoms are getting worse, you should go back to taking the medication 3 times a day and call or message me.  Continue prednisone 20 mg daily for now. This will be the next medication we try to lower.  Continue Cellcept 500 mg twice daily for now.   Continue B12 shots.  Call if you have any significant changes to your symptoms.  If you have difficulty swallowing or breathing, you need to go to the nearest emergency room as this could be myasthenic crisis.  I want to see you back in clinic in 1 month.  The physicians and staff at [facility] are committed to providing excellent care. You may receive a survey requesting feedback about your experience at our office. We strive to receive "very good" responses to the survey questions. If you feel that your experience would prevent you from giving the office a "very good " response, please contact our office to try to remedy the situation. We may be reached at [phone]. Thank you for taking the time out of your busy day to complete the survey.  Kai Levins, MD [facility]

## 2022-09-10 ENCOUNTER — Other Ambulatory Visit: Payer: Self-pay | Admitting: Neurology

## 2022-09-12 ENCOUNTER — Telehealth: Payer: Self-pay | Admitting: Neurology

## 2022-09-12 DIAGNOSIS — G7000 Myasthenia gravis without (acute) exacerbation: Secondary | ICD-10-CM

## 2022-09-12 MED ORDER — PREDNISONE 5 MG PO TABS: 15.0000 mg | ORAL_TABLET | Freq: Every day | ORAL | 2 refills | Status: AC

## 2022-09-12 MED ORDER — MYCOPHENOLATE MOFETIL 500 MG PO TABS: 500.0000 mg | ORAL_TABLET | Freq: Two times a day (BID) | ORAL | 11 refills | Status: DC

## 2022-09-12 NOTE — Telephone Encounter (Signed)
Pt called she stated that she is off of the Mestinon and that she feels like she is doing ok not being on it, she is asking if she needs to refill her cellcept and prednisone?

## 2022-09-12 NOTE — Telephone Encounter (Signed)
Patient called to give an update on a some medication changes.

## 2022-09-12 NOTE — Telephone Encounter (Signed)
Called patient to discuss symptoms. She is now off mestinon and has not noticed any change in her MG symptoms.  After discussion, we agreed to reduce her prednisone to 15 mg daily today. I sent a new script for 5 mg tablets to her pharmacy (she will take 3 tabs in the morning). If she notices an increase in symptoms, she was instructed to go back to 20 mg (4 tabs) and call our office. She also has Mestinon as needed.   She will stay on Cellcept 500 mg BID (refill sent to pharmacy). She will stay on these until follow up with me next month.  All questions were answered.  Kai Levins, MD [facility]

## 2022-09-23 ENCOUNTER — Telehealth: Payer: Self-pay | Admitting: Internal Medicine

## 2022-09-23 NOTE — Telephone Encounter (Signed)
Anderson Malta from [facility] called to get an update on a Packet she sent over to [place] for pt from the [facility]? Anderson Malta stated the packet was faxed over 09/10/22 & received a fax confirmation. Call back # [phone]

## 2022-09-23 NOTE — Telephone Encounter (Signed)
Left message on vm for Anderson Malta advising her that we faxed the [facility] forms and mailed them as well. Sent copies to the pt, also.

## 2022-09-25 DIAGNOSIS — H353131 Nonexudative age-related macular degeneration, bilateral, early dry stage: Secondary | ICD-10-CM | POA: Diagnosis not present

## 2022-09-25 DIAGNOSIS — G7000 Myasthenia gravis without (acute) exacerbation: Secondary | ICD-10-CM | POA: Diagnosis not present

## 2022-10-07 NOTE — Progress Notes (Unsigned)
I saw NEKESHA FONT in neurology clinic on 10/07/22 in follow up for myasthenia gravis.  HPI: JOIE HIPPS is a 87 y.o. year old female with a history of seropositive myasthenia gravis, polymyalgia rheumatica, HTN, HLD, pAfib, and OA who we last saw on 09/05/22.  To briefly review: Patient was previously seen in this office by Dr. Tomi Likens (last seen on 06/05/22). Per initial clinic note on 05/08/22: Since January-February 2023, she has had progressive weakness.  She began having trouble rolling over in bed.  She was unable to get up and off her bed so she started sleeping on the couch..  Noted weakness in the left leg.  Seems to be dragging her right leg.  When standing, always with sensation that legs will give out. She has had falls and unable to stand up, requiring to call EMS.  Notes particular weakness in the proximal arms and trouble keeping her head up.  Started relying on a cane more frequently and then wheelchair.  She also endorsed worsening musculoskeletal pain with pain from the neck down her spine to her sacrum.  She does have degenerative spine disease and polymyalgia rheumatica.  MRI of brain with and without contrast on 04/27/2022 personally reviewed showed moderate chronic small vessel ischemic changes and known 1 x 2 mm vestibular schwannoma within the right internal auditory canal, stable compared to prior imaging in 2017.  She started noticing that her left eyelid was drooping.  She went to the ophthalmologist who suspected temporal arteritis given her history of PMR.  No vision loss.  Sed rate and CRP on 04/23/2022 were 101 and 10.1 respectively and was started on prednisone 16m daily with some improvement.  Based on strong likelihood of giant cell arteritis with elevated inflammatory markers and having PMR, temporal artery biopsy was not pursued.  She was started on prednisone 634mdaily but discontinued after 3 days due to side effects.  She also has had increased urinary urgency and  sometimes fecal incontinence.  Due to sacral pain following a fall, she had X-ray of lumbar spine and MRI of sacrum which were negative for occult fracture.  AChR Binding Ab on 04/28/2022 was elevated at 28.10.  PCP started her on pyridostigmine 3059mhree times daily.  She notes improvement in strength in the legs.  However, she still has a head drop.  Denies double vision, trouble swallowing, trouble chewing, and trouble taking a breath.   At 05/08/22 visit, Dr. JafTomi Likenscommended prednisone 20 mg daily, mestinon 60 mg TID (7 am, 1 pm, 7 pm). CT chest on 05/15/22 was negative for mediastinal mass (no evidence of thymoma). Patient discontinued prednisone shortly after starting as it made her jittery and not able to sleep. At follow up with Dr. JafTomi Likens 06/05/22, patient was not feeling well, but was walking a little better. She still had head drop. Hospital admission for a course of IVIg was suggested to prevent MG crisis, but patient declined as she needed to be home to take care of her husband with Alzheimer's disease. Cellcept 500 mg BID was started with IVIg every 28 days until Cellcept was therapeutic. She was also continued on mestinon. CBC and CMP were set up to be check weekly for 4 weeks, followed by monthly for 3 months. Patient called the next day though because she did not want IVIg. She decided to restart prednisone instead.   She felt much improved on 09/05/22. She is about 75% better than prior visit.   Patient  lives at a retirement with healthcare and PT/OT. She has many resources there. Patient went to PT and OT. She is currently done with both. Husband is currently in a memory care unit twice a week.   Current medications: Cellcept 500 mg BID, Prednisone 20 mg once in the morning, Mestinon 60 mg TID (7:30-8am, 12pm, 8-9pm).   Side effects: Cellcept is difficult to take on a empty stomach. She gets a little nausea but this is moderately improved. She has hot flashes or temperature fluctuations  that are episodic. She is otherwise not having significant side effects.   MG symptoms on 09/05/22: Ptosis: Some drooping on left, pretty constant; improved compared to prior, worse when tired, not bothersome Double vision: Never Speech: Occasionally slurred speech, especially when tired; no one else commenting on it per patient Chewing: No problems currently Swallowing: Occasionally, episodic difficulty requiring a double swallow; no coughing Breathing: No problems currently Arm strength: No problems; Neck is still weak, but much improved. Cannot sit up straight for long periods of time Leg strength: Walks with a walker. She feels like her legs are strong. She has a walker due to not being able to stand up right.  At her visit with me on 09/05/22, we continued prednisone 20 mg daily, Cellcept 500 mg BID, but started tapering her mestinon (stopped on 09/11/22). I called the patient on 09/12/22 at which time she noted no change in her symptoms off mestinon. As a result, we agreed to begin tapering her prednisone. She decreased to 15 mg on 09/13/22.  Since their last visit: Patient is off Mestinon. She is currently on Prednisone 15 mg daily since 09/13/22. She feels better everyday per her report.  Current MG symptoms: Ptosis: Occasional left ptosis (mild), worse when tired. Never the right eyelid. Double vision: None Speech: Not heard by others, maybe a little slurred when tired Chewing: None Swallowing: No, occasionally feels like something is caught but lower in the chest. Breathing: None Arm strength: no significant weakness Leg strength: legs are getting stronger. Starting to walk more independently. Still uses a walker to be safe when not in the kitchen.  Current MG medications: Prednisone 15 mg daily, Cellcept 500 mg BID Side effects: Having some minor GI upset with Cellcept on empty stomach.  She mentions hot flashes that is relatively new. She is not sure what is causing this. She also  has some numbness in her pinkies and feet that come and go.  Only new medication is an eye drop (Miebo) and back on flonase on for allergies.   MEDICATIONS:  Outpatient Encounter Medications as of 10/08/2022  Medication Sig   acetaminophen (TYLENOL) 500 MG tablet Take 1,000 mg by mouth as needed.   amiodarone (PACERONE) 200 MG tablet Take 200 mg by mouth daily.   Cholecalciferol (VITAMIN D) 2000 units CAPS Take 2,000 Units by mouth daily.   cyanocobalamin (,VITAMIN B-12,) 1000 MCG/ML injection INJECT 1 ML MONTHLY AS DIRECTED   dimenhyDRINATE (DRAMAMINE PO) Take by mouth.   fluticasone (FLONASE) 50 MCG/ACT nasal spray Place 1 spray into both nostrils daily.   folic acid (FOLVITE) 1 MG tablet Take 1 tablet (1 mg total) by mouth daily.   lansoprazole (PREVACID) 15 MG capsule Take 1 capsule (15 mg total) by mouth daily at 12 noon.   metoprolol succinate (TOPROL XL) 25 MG 24 hr tablet Take 1 tablet (25 mg total) by mouth daily.   Multiple Vitamins-Minerals (PRESERVISION AREDS 2 PO) Take by mouth daily.   mycophenolate (  CELLCEPT) 500 MG tablet Take 1 tablet (500 mg total) by mouth 2 (two) times daily.   predniSONE (DELTASONE) 5 MG tablet Take 3 tablets (15 mg total) by mouth daily.   propranolol (INDERAL) 10 MG tablet Take 1 tablet (10 mg total) by mouth 3 (three) times daily. daily as needed (for breakthrough atrial fibrillation)   pyridostigmine (MESTINON) 60 MG tablet Take 1 tablet at 7 AM, 1 tablet at 1 PM and 1 tablet at 7 PM   rosuvastatin (CRESTOR) 5 MG tablet Take 1 tablet (5 mg total) by mouth daily.   valsartan (DIOVAN) 80 MG tablet Take 40 mg by mouth daily.   XARELTO 20 MG TABS tablet TAKE 1 TABLET BY MOUTH ONCE A DAY WITH THE EVENING MEAL.   calcium carbonate (CALCIUM 600) 600 MG TABS tablet Take 1 tablet (600 mg total) by mouth daily. (Patient not taking: Reported on 09/05/2022)   diltiazem (CARDIZEM) 30 MG tablet Take 1 tablet (30 mg total) by mouth 3 (three) times daily as needed  (atrial fibrillation lasting more than 2 hours). (Patient not taking: Reported on 09/05/2022)   hydrocortisone 2.5 % cream Apply topically 3 (three) times daily as needed. (Patient not taking: Reported on 09/05/2022)   LORazepam (ATIVAN) 0.5 MG tablet TAKE ONE HALF TO 1 TABLET BY MOUTH TWICEDAILY AS NEEDED FOR ANXIETY (Patient not taking: Reported on 1/66/0630)   Trolamine Salicylate (ASPERCREME EX) Apply topically as needed. (Patient not taking: Reported on 09/05/2022)   No facility-administered encounter medications on file as of 10/08/2022.    PAST MEDICAL HISTORY: Past Medical History:  Diagnosis Date   Adenomatous colon polyp    Allergic rhinitis due to pollen    Anemia    Anxiety    Bronchiectasis ([place])    Complication of anesthesia    Degenerative disc disease    cervical and lumbar   Dysrhythmia    GERD (gastroesophageal reflux disease)    Hearing loss in right ear    Heart murmur    HOH (hard of hearing)    AIDS   Hyperlipidemia    Hypertension    Osteoarthritis, multiple sites    PAF (paroxysmal atrial fibrillation) ([place])    a. 01/2013 [facility]): EF 60-65%, no rwma, mildly dil LA/RA, mild AI/TR, trace MR, nl RV size/fxn, mild PAH; b. CHA2DS2VASc = 4-->Chronic Xarelto.   PMR (polymyalgia rheumatica) (HCC)    PONV (postoperative nausea and vomiting)    Rheumatic fever    Sleep apnea    NO CPAP   Urge incontinence    Vertigo    Vertigo     PAST SURGICAL HISTORY: Past Surgical History:  Procedure Laterality Date   ABDOMINAL HYSTERECTOMY     ANKLE FRACTURE SURGERY Left 1996   BACK SURGERY     BREAST BIOPSY Left 2010   negative   CATARACT EXTRACTION W/PHACO Left 10/05/2018   Procedure: CATARACT EXTRACTION PHACO AND INTRAOCULAR LENS PLACEMENT ([place]);  Surgeon: Birder Robson, MD;  Location: [facility] ORS;  Service: Ophthalmology;  Laterality: Left;  [place] 00:40 CDE 6.92 fluid pack lot # [id number]      CATARACT EXTRACTION W/PHACO Right 10/26/2018    Procedure: CATARACT EXTRACTION PHACO AND INTRAOCULAR LENS PLACEMENT ([facility]);  Surgeon: Birder Robson, MD;  Location: [facility] ORS;  Service: Ophthalmology;  Laterality: Right;  [place]  00:47 CDE 7.11 Fluid pack lot # [id number]   DEXA  11/09/06   normal   FRACTURE SURGERY     Meningitis  1963   SPINE SURGERY  SYNOVECTOMY Right 1973   elbow   TONSILLECTOMY AND ADENOIDECTOMY      ALLERGIES: Allergies  Allergen Reactions   Actonel [Risedronate Sodium] Nausea And Vomiting   Boniva [Ibandronic Acid] Nausea And Vomiting   Diclofenac Other (See Comments)    Voltaren Gel aggravated her sinus passages   Fosamax [Alendronate Sodium] Nausea And Vomiting   Lipitor [Atorvastatin] Other (See Comments)    Muscle aches. Tolerates Crestor.   Miacalcin [Calcitonin ([place])] Other (See Comments)    Sneezing    Myrbetriq [Mirabegron] Other (See Comments)    Gastritis   Talwin [Pentazocine] Other (See Comments)    Hallucinations    Latex Rash    Sneezing, watery eyes.   Niaspan [Niacin Er] Swelling and Rash    FAMILY HISTORY: Family History  Problem Relation Age of Onset   Schizophrenia Mother        paranoid   Heart disease Mother        from psych meds   Arthritis Mother    Rheum arthritis Mother    Cancer Father        prostate   Arthritis Father    Diabetes Father    Breast cancer Paternal Aunt 47   Ovarian cancer Maternal Grandmother    Uterine cancer Maternal Grandmother    Colon cancer Maternal Grandmother    Healthy Son     SOCIAL HISTORY: Social History   Tobacco Use   Smoking status: Never    Passive exposure: Never   Smokeless tobacco: Never  Vaping Use   Vaping Use: Never used  Substance Use Topics   Alcohol use: Yes   Drug use: No   Social History   Social History Narrative   Retired 2007    1 son in [place] area   Spends part time in [place]      Has living will   Husband then son is health care POA   Not sure about DNR---will keep open resuscitation  for now    No tube feedings if cognitively unaware   Right handed                Objective:  Vital Signs:  BP (!) 182/68   Pulse 67   Ht 5' 5" (1.651 m)   Wt 157 lb (71.2 kg)   SpO2 97%   BMI 26.13 kg/m   General: General appearance: Awake and alert. No distress. Cooperative with exam. Skin: No rash or jaundice. HEENT: Atraumatic. Anicteric. Lungs: Non-labored breathing on room air  Psych: Affect appropriate  Neurological: Mental Status: Alert. Speech fluent. No pseudobulbar affect Cranial Nerves: CNII: No RAPD. Visual fields intact. CNIII, IV, VI: PERRL. No nystagmus. EOMI. CN V: Facial sensation intact bilaterally to fine touch. CN VII: Facial muscles symmetric and strong. Mild left ptosis, not worse with sustained up gaze. CN VIII: Hears finger rub well bilaterally. CN IX: No hypophonia. CN X: Palate elevates symmetrically. CN XI: Full strength shoulder shrug bilaterally. CN XII: Tongue protrusion full and midline. No atrophy or fasciculations. No significant dysarthria Motor: Tone is normal. Mild atrophy of bilateral APB.  Individual muscle group testing (MRC grade out of 5):  Movement     Neck flexion 5    Neck extension 5     Right Left   Shoulder abduction 5 5   Shoulder adduction 5 5   Shoulder ext rotation 5 5   Shoulder int rotation 5 5   Elbow flexion 5 5   Elbow extension 5 5  Wrist extension 5 5   Wrist flexion 5 5   Finger abduction - FDI 5 5   Finger abduction - ADM 5 5   Finger extension 5- 4+   Finger distal flexion - 2/_0 Finger distal flexion - 4/_1 Thumb flexion - FPL 5 5   Thumb abduction - APB 4+ 4+    Hip flexion 5- 5-   Hip extension 5 5   Hip adduction 5 5   Hip abduction 5 5   Knee extension 5 5   Knee flexion 5 5   Dorsiflexion 5 5   Plantarflexion 5 5   Inversion 5 5   Eversion 5 5   Great toe extension 5 5   Great toe flexion 5 5     Reflexes:  Right Left  Bicep 2+ 2+  Tricep 2+ 2+  BrRad 2+ 2+   Knee 1+ 1+  Ankle 1+ 1+   Sensation: Intact to light touch in all extremities Coordination: Intact finger-to- nose-finger and heel-to-shin bilaterally. Romberg negative. Gait: Able to rise from chair with arms crossed unassisted. Walks with a walker, slow, waddling, more steady than prior.   Lab and Test Review: No new labs or tests reviewed.  Previous testing: 08/27/22: Normal or unremarkable: CBC, CMP, TSH 08/15/22: Normal ESR and CRP   AChR binding ab: positive at 28.10   CT chest (05/15/22): FINDINGS: Cardiovascular: Moderate aortic atherosclerosis. No aneurysm. Coronary vascular calcification. Normal cardiac size. No pericardial effusion   Mediastinum/Nodes: Midline trachea. No thyroid mass. Subcarinal node measuring 12 mm. Esophagus is normal.   Lungs/Pleura: Mild bronchiectasis in the right middle lobe and bilateral lower lobes with scarring. No acute airspace disease, pleural effusion or pneumothorax.   Upper Abdomen: No acute abnormality.   Musculoskeletal: No chest wall abnormality. No acute or significant osseous findings.   IMPRESSION: 1. Negative for mediastinal mass lesion. 2. Mild bronchiectasis in the right middle and bilateral lower lobes with pulmonary scarring  ASSESSMENT: This is [name], a 87 y.o. female with:  AChR ab positive generalized myasthenia gravis. CT chest on 05/13/22 with no mediastinal mass.  Plan: -CBC and CMP today -Continue Cellcept 500 mg BID -Continue prednisone 15 mg daily for a total of 2 months (until 11/13/22), then instructed to decrease to 10 mg daily until follow up -Patient instructed to increase back to 15 mg if MG symptoms worsen and call the office. -Mestinon 60 mg as needed -Continue staying active -Discussed MG crisis, what to watch for, and when to go to nearest ED including difficulty breathing or swallowing  Return to clinic in 3 months  Total time spent reviewing records, interview, history/exam,  documentation, and coordination of care on day of encounter:  40 min  Kai Levins, MD

## 2022-10-08 ENCOUNTER — Telehealth: Payer: Medicare PPO | Admitting: Neurology

## 2022-10-08 ENCOUNTER — Encounter: Payer: Self-pay | Admitting: Neurology

## 2022-10-08 ENCOUNTER — Other Ambulatory Visit (INDEPENDENT_AMBULATORY_CARE_PROVIDER_SITE_OTHER): Payer: Medicare PPO

## 2022-10-08 VITALS — BP 182/68 | HR 67 | Ht 65.0 in | Wt 157.0 lb

## 2022-10-08 DIAGNOSIS — M5382 Other specified dorsopathies, cervical region: Secondary | ICD-10-CM | POA: Diagnosis not present

## 2022-10-08 DIAGNOSIS — R269 Unspecified abnormalities of gait and mobility: Secondary | ICD-10-CM

## 2022-10-08 DIAGNOSIS — H02403 Unspecified ptosis of bilateral eyelids: Secondary | ICD-10-CM

## 2022-10-08 DIAGNOSIS — G7000 Myasthenia gravis without (acute) exacerbation: Secondary | ICD-10-CM | POA: Diagnosis not present

## 2022-10-08 LAB — COMPREHENSIVE METABOLIC PANEL
ALT: 24 U/L (ref 0–35)
AST: 19 U/L (ref 0–37)
Albumin: 4.2 g/dL (ref 3.5–5.2)
Alkaline Phosphatase: 42 U/L (ref 39–117)
BUN: 19 mg/dL (ref 6–23)
CO2: 30 mEq/L (ref 19–32)
Calcium: 9.3 mg/dL (ref 8.4–10.5)
Chloride: 103 mEq/L (ref 96–112)
Creatinine, Ser: 0.73 mg/dL (ref 0.40–1.20)
GFR: 75.17 mL/min (ref >60.00)
Glucose, Bld: 100 mg/dL — ABNORMAL HIGH (ref 70–99)
Potassium: 4.6 mEq/L (ref 3.5–5.1)
Sodium: 140 mEq/L (ref 135–145)
Total Bilirubin: 0.5 mg/dL (ref 0.2–1.2)
Total Protein: 7.1 g/dL (ref 6.0–8.3)

## 2022-10-08 LAB — CBC
HCT: 37.3 % (ref 36.0–46.0)
Hemoglobin: 12.4 g/dL (ref 12.0–15.0)
MCHC: 33.2 g/dL (ref 30.0–36.0)
MCV: 97.5 fl (ref 78.0–100.0)
Platelets: 252 10*3/uL (ref 150.0–400.0)
RBC: 3.82 Mil/uL — ABNORMAL LOW (ref 3.87–5.11)
RDW: 15.2 % (ref 11.5–15.5)
WBC: 9.7 10*3/uL (ref 4.0–10.5)

## 2022-10-08 NOTE — Patient Instructions (Addendum)
I would like to repeat some blood work today because you are on Cellcept.  Continue Cellcept 500 mg twice daily.  Continue prednisone 15 mg daily (3 tablets) until 11/13/22. Then reduce to 10 mg daily (2 tablets) until you follow up with me. If you have an increase in symptoms, increase back to 3 tablets and call me!  You can take Mestinon 60 mg as needed.  Continue staying active.  Return to clinic in 3 months.  The physicians and staff at [facility] are committed to providing excellent care. You may receive a survey requesting feedback about your experience at our office. We strive to receive "very good" responses to the survey questions. If you feel that your experience would prevent you from giving the office a "very good " response, please contact our office to try to remedy the situation. We may be reached at [phone]. Thank you for taking the time out of your busy day to complete the survey.  Kai Levins, MD [facility]

## 2022-10-13 ENCOUNTER — Other Ambulatory Visit: Payer: Self-pay | Admitting: Internal Medicine

## 2022-10-13 DIAGNOSIS — J309 Allergic rhinitis, unspecified: Secondary | ICD-10-CM | POA: Diagnosis not present

## 2022-10-13 DIAGNOSIS — J300 Vasomotor rhinitis: Secondary | ICD-10-CM | POA: Diagnosis not present

## 2022-10-13 NOTE — Telephone Encounter (Signed)
Last filled 08-26-22 #30 Last OV 05-06-22 Next OV 12-03-22 [place]

## 2022-10-20 ENCOUNTER — Encounter (INDEPENDENT_AMBULATORY_CARE_PROVIDER_SITE_OTHER): Payer: Self-pay

## 2022-10-23 DIAGNOSIS — G7000 Myasthenia gravis without (acute) exacerbation: Secondary | ICD-10-CM | POA: Diagnosis not present

## 2022-10-30 ENCOUNTER — Telehealth: Payer: Self-pay | Admitting: Neurology

## 2022-10-30 NOTE — Telephone Encounter (Signed)
Pt has a few questions about getting the covid shots since her new DX

## 2022-10-30 NOTE — Telephone Encounter (Signed)
Called patient back to discuss her questions about the COVID vaccine. She has not gotten her COVID vaccine yet. I explained there is no contraindication to the vaccine. I explained that since she is immunosuppressed, she may be at more risk for infection. There is no evidence that I am aware of that the COVID booster worsens MG, but told her to monitor should she get the vaccine, which she thinks she will.  Overall, patient is currently doing well on reduced prednisone.  All questions were answered.  Kai Levins, MD [facility]

## 2022-11-17 ENCOUNTER — Telehealth: Payer: Self-pay | Admitting: Anesthesiology

## 2022-11-17 NOTE — Telephone Encounter (Signed)
Tried calling patient, Per Dr.Hill, Can we let her know that she can get these vaccines if she wishes? There is no data that suggests either would cause a problem for her myasthenia gravis.  Thank you,  Kai Levins, MD

## 2022-11-17 NOTE — Telephone Encounter (Signed)
Patient left a message stating she would like to ask Dr Berdine Addison if she needs to get the RSV and Flu vaccine. She already had her Covid vaccine and she didn't have any reactions.

## 2022-11-28 ENCOUNTER — Other Ambulatory Visit: Payer: Self-pay | Admitting: Neurology

## 2022-12-03 ENCOUNTER — Encounter: Payer: Self-pay | Admitting: Internal Medicine

## 2022-12-03 ENCOUNTER — Ambulatory Visit (INDEPENDENT_AMBULATORY_CARE_PROVIDER_SITE_OTHER): Payer: Medicare PPO | Admitting: Internal Medicine

## 2022-12-03 VITALS — BP 130/80 | HR 58 | Temp 97.4°F | Ht 65.75 in | Wt 155.0 lb

## 2022-12-03 DIAGNOSIS — Z0000 Encounter for general adult medical examination without abnormal findings: Secondary | ICD-10-CM | POA: Diagnosis not present

## 2022-12-03 DIAGNOSIS — G7000 Myasthenia gravis without (acute) exacerbation: Secondary | ICD-10-CM

## 2022-12-03 DIAGNOSIS — F39 Unspecified mood [affective] disorder: Secondary | ICD-10-CM

## 2022-12-03 DIAGNOSIS — I480 Paroxysmal atrial fibrillation: Secondary | ICD-10-CM

## 2022-12-03 DIAGNOSIS — M353 Polymyalgia rheumatica: Secondary | ICD-10-CM | POA: Diagnosis not present

## 2022-12-03 DIAGNOSIS — E441 Mild protein-calorie malnutrition: Secondary | ICD-10-CM | POA: Diagnosis not present

## 2022-12-03 NOTE — Assessment & Plan Note (Signed)
Reactive depression mostly Doing a bit better now--no meds other than occasional lorazepam

## 2022-12-03 NOTE — Assessment & Plan Note (Signed)
Regular on the amiodarone 200 daily Xarelto 20 daily

## 2022-12-03 NOTE — Assessment & Plan Note (Signed)
Now down to '10mg'$  daily of prednisone

## 2022-12-03 NOTE — Progress Notes (Signed)
Hearing Screening - Comments:: July 2023 Vision Screening - Comments:: October 2023

## 2022-12-03 NOTE — Assessment & Plan Note (Signed)
I have personally reviewed the Medicare Annual Wellness questionnaire and have noted 1. The patient's medical and social history 2. Their use of alcohol, tobacco or illicit drugs 3. Their current medications and supplements 4. The patient's functional ability including ADL's, fall risks, home safety risks and hearing or visual             impairment. 5. Diet and physical activities 6. Evidence for depression or mood disorders  The patients weight, height, BMI and visual acuity have been recorded in the chart I have made referrals, counseling and provided education to the patient based review of the above and I have provided the pt with a written personalized care plan for preventive services.  I have provided you with a copy of your personalized plan for preventive services. Please take the time to review along with your updated medication list.  Multiple challenges but doing better No cancer screening due to age Has had RSV, COVID and flu vaccines this fall Stays as active as she can

## 2022-12-03 NOTE — Assessment & Plan Note (Signed)
Has lost considerable weight with her illnesses and caregiver strain Doing some better now--ordering in food, etc

## 2022-12-03 NOTE — Progress Notes (Signed)
Subjective:    Patient ID: Jennifer Schmitt, female    DOB: 29-Apr-1935, 87 y.o.   MRN: [id number]  HPI Here for Medicare wellness visit and follow up of chronic health conditions Reviewed advanced directives Reviewed other doctors----Dr Porfilio--ophthal, Dr Rice--rheum, Dr Hill--neurology, Dr Gollan--cardiology, Dr Sophronia Simas cardio, Dr Barbera Setters, Dr Jonna Clark, Dr Guinevere Ferrari, dermatologist in [place] No hospitalizations or surgery this year Vision is not as good--may need new glasses Hearing is okay in left--deaf in right Stopped all alcohol No tobacco  Still exercising regularly--stays as active as possible and resistance No falls Mild mood issues Has aides to help with heavy housework Mild memory issues--?from stress. Nothing worrisome   Doing better with the myasthenia Still tires very easy Able to walk with rollator Can drive  Stress with husband's Alzheimer's He does go to day program---but can't be alone when home Can't do ADLs or help with housework She does have aides to help Mild depression with this--especially bad a few months ago (as she improved some) Still not sleeping well--uses the lorazepam rarely (can sleep better with xyzal also)  No palpitations No chest pain or SOB No dizziness or syncope No edema  Current Outpatient Medications on File Prior to Visit  Medication Sig Dispense Refill   acetaminophen (TYLENOL) 500 MG tablet Take 1,000 mg by mouth as needed.     calcium carbonate (CALCIUM 600) 600 MG TABS tablet Take 1 tablet (600 mg total) by mouth daily. 30 tablet 0   Cholecalciferol (VITAMIN D) 2000 units CAPS Take 2,000 Units by mouth daily.     cyanocobalamin (,VITAMIN B-12,) 1000 MCG/ML injection INJECT 1 ML MONTHLY AS DIRECTED 10 mL 1   diltiazem (CARDIZEM) 30 MG tablet Take 1 tablet (30 mg total) by mouth 3 (three) times daily as needed (atrial fibrillation lasting more than 2 hours). 60 tablet 3   dimenhyDRINATE (DRAMAMINE  PO) Take by mouth.     fluticasone (FLONASE) 50 MCG/ACT nasal spray Place 1 spray into both nostrils daily.     folic acid (FOLVITE) 1 MG tablet Take 1 tablet (1 mg total) by mouth daily. 90 tablet 3   hydrocortisone 2.5 % cream Apply topically 3 (three) times daily as needed. 28 g 3   lansoprazole (PREVACID) 15 MG capsule TAKE 1 CAPSULE BY MOUTH DAILY AT 12 (NOON) 30 capsule 2   LORazepam (ATIVAN) 0.5 MG tablet TAKE ONE HALF TO 1 TABLET BY MOUTH TWICEDAILY AS NEEDED FOR ANXIETY 30 tablet 0   metoprolol succinate (TOPROL XL) 25 MG 24 hr tablet Take 1 tablet (25 mg total) by mouth daily. 90 tablet 3   Multiple Vitamins-Minerals (PRESERVISION AREDS 2 PO) Take by mouth daily.     mycophenolate (CELLCEPT) 500 MG tablet Take 1 tablet (500 mg total) by mouth 2 (two) times daily. 60 tablet 11   predniSONE (DELTASONE) 5 MG tablet Take 3 tablets (15 mg total) by mouth daily. 90 tablet 2   propranolol (INDERAL) 10 MG tablet Take 1 tablet (10 mg total) by mouth 3 (three) times daily. daily as needed (for breakthrough atrial fibrillation) 60 tablet 1   pyridostigmine (MESTINON) 60 MG tablet Take 1 tablet at 7 AM, 1 tablet at 1 PM and 1 tablet at 7 PM 90 tablet 5   rosuvastatin (CRESTOR) 5 MG tablet Take 1 tablet (5 mg total) by mouth daily. 90 tablet 3   Trolamine Salicylate (ASPERCREME EX) Apply topically as needed.     valsartan (DIOVAN) 80 MG tablet Take 40 mg  by mouth daily.     XARELTO 20 MG TABS tablet TAKE 1 TABLET BY MOUTH ONCE A DAY WITH THE EVENING MEAL. 90 tablet 1   amiodarone (PACERONE) 200 MG tablet Take 200 mg by mouth daily.     No current facility-administered medications on file prior to visit.    Allergies  Allergen Reactions   Actonel [Risedronate Sodium] Nausea And Vomiting   Boniva [Ibandronic Acid] Nausea And Vomiting   Diclofenac Other (See Comments)    Voltaren Gel aggravated her sinus passages   Fosamax [Alendronate Sodium] Nausea And Vomiting   Lipitor [Atorvastatin] Other  (See Comments)    Muscle aches. Tolerates Crestor.   Miacalcin [Calcitonin ([place])] Other (See Comments)    Sneezing    Myrbetriq [Mirabegron] Other (See Comments)    Gastritis   Talwin [Pentazocine] Other (See Comments)    Hallucinations    Latex Rash    Sneezing, watery eyes.   Niaspan [Niacin Er] Swelling and Rash    Past Medical History:  Diagnosis Date   Adenomatous colon polyp    Allergic rhinitis due to pollen    Anemia    Anxiety    Bronchiectasis (HCC)    Complication of anesthesia    Degenerative disc disease    cervical and lumbar   Dysrhythmia    GERD (gastroesophageal reflux disease)    Hearing loss in right ear    Heart murmur    HOH (hard of hearing)    AIDS   Hyperlipidemia    Hypertension    Osteoarthritis, multiple sites    PAF (paroxysmal atrial fibrillation) ([place])    a. 01/2013 Echo [facility]): EF 60-65%, no rwma, mildly dil LA/RA, mild AI/TR, trace MR, nl RV size/fxn, mild PAH; b. CHA2DS2VASc = 4-->Chronic Xarelto.   PMR (polymyalgia rheumatica) (HCC)    PONV (postoperative nausea and vomiting)    Rheumatic fever    Sleep apnea    NO CPAP   Urge incontinence    Vertigo    Vertigo     Past Surgical History:  Procedure Laterality Date   ABDOMINAL HYSTERECTOMY     ANKLE FRACTURE SURGERY Left 1996   BACK SURGERY     BREAST BIOPSY Left 2010   negative   CATARACT EXTRACTION W/PHACO Left 10/05/2018   Procedure: CATARACT EXTRACTION PHACO AND INTRAOCULAR LENS PLACEMENT ([place]);  Surgeon: Birder Robson, MD;  Location: [facility] ORS;  Service: Ophthalmology;  Laterality: Left;  [place] 00:40 CDE 6.92 fluid pack lot # [id number]      CATARACT EXTRACTION W/PHACO Right 10/26/2018   Procedure: CATARACT EXTRACTION PHACO AND INTRAOCULAR LENS PLACEMENT ([facility]);  Surgeon: Birder Robson, MD;  Location: [facility] ORS;  Service: Ophthalmology;  Laterality: Right;  [place]  00:47 CDE 7.11 Fluid pack lot # [id number]   DEXA  11/09/06   normal   FRACTURE SURGERY      Meningitis  1963   SPINE SURGERY     SYNOVECTOMY Right 1973   elbow   TONSILLECTOMY AND ADENOIDECTOMY      Family History  Problem Relation Age of Onset   Schizophrenia Mother        paranoid   Heart disease Mother        from psych meds   Arthritis Mother    Rheum arthritis Mother    Cancer Father        prostate   Arthritis Father    Diabetes Father    Breast cancer Paternal Aunt 81   Ovarian cancer Maternal Grandmother  Uterine cancer Maternal Grandmother    Colon cancer Maternal Grandmother    Healthy Son     Social History   Socioeconomic History   Marital status: Married    Spouse name: Not on file   Number of children: 1   Years of education: Not on file   Highest education level: Not on file  Occupational History   Occupation: Scientist, physiological and college professor    Comment: [employer]--PhD in Special education  Tobacco Use   Smoking status: Never    Passive exposure: Never   Smokeless tobacco: Never  Vaping Use   Vaping Use: Never used  Substance and Sexual Activity   Alcohol use: Yes   Drug use: No   Sexual activity: Not on file  Other Topics Concern   Not on file  Social History Narrative   Retired 2007    1 son in [place] area   Spends part time in [place]      Has living will   Husband then son is health care POA--will need to be son with husband's decline   Not sure about DNR---will keep open resuscitation for now    No tube feedings if cognitively unaware   Right handed               Social Determinants of Health   Financial Resource Strain: Not on file  Food Insecurity: Not on file  Transportation Needs: Not on file  Physical Activity: Not on file  Stress: Not on file  Social Connections: Not on file  Intimate Partner Violence: Not on file   Review of Systems Has lost considerable weight--25# or more Trouble with digestive system due to the [facility] Eating more keto like Some constipation issues Urinary incontinence is  better now Wears seat belt Teeth are okay--keeps up with dentist No suspicious skin lesions now--just bruises easily now No recent back or joint pains Some numbness in fingers---since MG diagnosis    Objective:   Physical Exam Constitutional:      Appearance: Normal appearance.  HENT:     Mouth/Throat:     Comments: No lesions Eyes:     Conjunctiva/sclera: Conjunctivae normal.     Pupils: Pupils are equal, round, and reactive to light.  Cardiovascular:     Rate and Rhythm: Normal rate and regular rhythm.     Comments: Faint pedal pulses Pulmonary:     Effort: Pulmonary effort is normal.     Breath sounds: Normal breath sounds. No wheezing or rales.  Abdominal:     Palpations: Abdomen is soft.     Tenderness: There is no abdominal tenderness.  Musculoskeletal:     Cervical back: Neck supple.     Right lower leg: No edema.     Left lower leg: No edema.  Lymphadenopathy:     Cervical: No cervical adenopathy.  Skin:    Findings: No lesion or rash.  Neurological:     Mental Status: She is alert and oriented to person, place, and time.     Comments: Mini-cog normal  Psychiatric:        Mood and Affect: Mood normal.        Behavior: Behavior normal.            Assessment & Plan:

## 2022-12-03 NOTE — Assessment & Plan Note (Addendum)
Marked improvement with mestinon 60 tid, cellcept 500 bid On decreasing prednisone

## 2022-12-24 ENCOUNTER — Telehealth: Payer: Self-pay | Admitting: Internal Medicine

## 2022-12-24 MED ORDER — CYANOCOBALAMIN 1000 MCG/ML IJ SOLN: INTRAMUSCULAR | 11 refills | Status: DC

## 2022-12-24 NOTE — Telephone Encounter (Signed)
Jennifer Schmitt, [place] in stated pt would like her B12 injection prescription to be sent to them  . Please advise

## 2022-12-24 NOTE — Telephone Encounter (Signed)
Rx sent to [facility]

## 2023-01-15 NOTE — Progress Notes (Deleted)
NEUROLOGY FOLLOW UP OFFICE NOTE  Jennifer Schmitt [id number]  Subjective:  Jennifer Schmitt is a 88 y.o. year old female with a history of seropositive myasthenia gravis, polymyalgia rheumatica, HTN, HLD, pAfib, and OA who we last saw on 10/08/22.  To briefly review: Patient was previously seen in this office by Dr. Tomi Likens (last seen on 06/05/22). Per initial clinic note on 05/08/22: Since January-February 2023, she has had progressive weakness.  She began having trouble rolling over in bed.  She was unable to get up and off her bed so she started sleeping on the couch..  Noted weakness in the left leg.  Seems to be dragging her right leg.  When standing, always with sensation that legs will give out. She has had falls and unable to stand up, requiring to call EMS.  Notes particular weakness in the proximal arms and trouble keeping her head up.  Started relying on a cane more frequently and then wheelchair.  She also endorsed worsening musculoskeletal pain with pain from the neck down her spine to her sacrum.  She does have degenerative spine disease and polymyalgia rheumatica.  MRI of brain with and without contrast on 04/27/2022 personally reviewed showed moderate chronic small vessel ischemic changes and known 1 x 2 mm vestibular schwannoma within the right internal auditory canal, stable compared to prior imaging in 2017.  She started noticing that her left eyelid was drooping.  She went to the ophthalmologist who suspected temporal arteritis given her history of PMR.  No vision loss.  Sed rate and CRP on 04/23/2022 were 101 and 10.1 respectively and was started on prednisone '60mg'$  daily with some improvement.  Based on strong likelihood of giant cell arteritis with elevated inflammatory markers and having PMR, temporal artery biopsy was not pursued.  She was started on prednisone '60mg'$  daily but discontinued after 3 days due to side effects.  She also has had increased urinary urgency and sometimes fecal  incontinence.  Due to sacral pain following a fall, she had X-ray of lumbar spine and MRI of sacrum which were negative for occult fracture.  AChR Binding Ab on 04/28/2022 was elevated at 28.10.  PCP started her on pyridostigmine '30mg'$  three times daily.  She notes improvement in strength in the legs.  However, she still has a head drop.  Denies double vision, trouble swallowing, trouble chewing, and trouble taking a breath.   At 05/08/22 visit, Dr. Tomi Likens recommended prednisone 20 mg daily, mestinon 60 mg TID (7 am, 1 pm, 7 pm). CT chest on 05/15/22 was negative for mediastinal mass (no evidence of thymoma). Patient discontinued prednisone shortly after starting as it made her jittery and not able to sleep. At follow up with Dr. Tomi Likens on 06/05/22, patient was not feeling well, but was walking a little better. She still had head drop. Hospital admission for a course of IVIg was suggested to prevent MG crisis, but patient declined as she needed to be home to take care of her husband with Alzheimer's disease. Cellcept 500 mg BID was started with IVIg every 28 days until Cellcept was therapeutic. She was also continued on mestinon. CBC and CMP were set up to be check weekly for 4 weeks, followed by monthly for 3 months. Patient called the next day though because she did not want IVIg. She decided to restart prednisone instead.   She felt much improved on 09/05/22. She is about 75% better than prior visit.   Patient lives at a retirement with  healthcare and PT/OT. She has many resources there. Patient went to PT and OT. She is currently done with both. Husband is currently in a memory care unit twice a week.   Current medications: Cellcept 500 mg BID, Prednisone 20 mg once in the morning, Mestinon 60 mg TID (7:30-8am, 12pm, 8-9pm).   Side effects: Cellcept is difficult to take on a empty stomach. She gets a little nausea but this is moderately improved. She has hot flashes or temperature fluctuations that are  episodic. She is otherwise not having significant side effects.   MG symptoms on 09/05/22: Ptosis: Some drooping on left, pretty constant; improved compared to prior, worse when tired, not bothersome Double vision: Never Speech: Occasionally slurred speech, especially when tired; no one else commenting on it per patient Chewing: No problems currently Swallowing: Occasionally, episodic difficulty requiring a double swallow; no coughing Breathing: No problems currently Arm strength: No problems; Neck is still weak, but much improved. Cannot sit up straight for long periods of time Leg strength: Walks with a walker. She feels like her legs are strong. She has a walker due to not being able to stand up right.   At her visit on 09/05/22, we continued prednisone 20 mg daily, Cellcept 500 mg BID, but started tapering her mestinon (stopped on 09/11/22). I called the patient on 09/12/22 at which time she noted no change in her symptoms off mestinon. As a result, we agreed to begin tapering her prednisone. She decreased to 15 mg on 09/13/22.  Most recent Assessment and Plan (10/08/22): This is Jennifer Schmitt, a 88 y.o. female with:   AChR ab positive generalized myasthenia gravis. CT chest on 05/13/22 with no mediastinal mass.   Plan: -CBC and CMP today -Continue Cellcept 500 mg BID -Continue prednisone 15 mg daily for a total of 2 months (until 11/13/22), then instructed to decrease to 10 mg daily until follow up -Patient instructed to increase back to 15 mg if MG symptoms worsen and call the office. -Mestinon 60 mg as needed -Continue staying active -Discussed MG crisis, what to watch for, and when to go to nearest ED including difficulty breathing or swallowing  Since their last visit: ***  Current MG symptoms: Ptosis: *** Double vision: *** Speech: *** Chewing: *** Swallowing: *** Breathing: *** Arm strength: *** Leg strength: ***   MEDICATIONS:  Outpatient Encounter Medications as of  01/22/2023  Medication Sig   acetaminophen (TYLENOL) 500 MG tablet Take 1,000 mg by mouth as needed.   amiodarone (PACERONE) 200 MG tablet Take 200 mg by mouth daily.   calcium carbonate (CALCIUM 600) 600 MG TABS tablet Take 1 tablet (600 mg total) by mouth daily.   Cholecalciferol (VITAMIN D) 2000 units CAPS Take 2,000 Units by mouth daily.   cyanocobalamin (VITAMIN B12) 1000 MCG/ML injection INJECT 1 ML MONTHLY AS DIRECTED   diltiazem (CARDIZEM) 30 MG tablet Take 1 tablet (30 mg total) by mouth 3 (three) times daily as needed (atrial fibrillation lasting more than 2 hours).   dimenhyDRINATE (DRAMAMINE PO) Take by mouth.   fluticasone (FLONASE) 50 MCG/ACT nasal spray Place 1 spray into both nostrils daily.   folic acid (FOLVITE) 1 MG tablet Take 1 tablet (1 mg total) by mouth daily.   hydrocortisone 2.5 % cream Apply topically 3 (three) times daily as needed.   lansoprazole (PREVACID) 15 MG capsule TAKE 1 CAPSULE BY MOUTH DAILY AT 12 (NOON)   LORazepam (ATIVAN) 0.5 MG tablet TAKE ONE HALF TO 1 TABLET BY  MOUTH TWICEDAILY AS NEEDED FOR ANXIETY   metoprolol succinate (TOPROL XL) 25 MG 24 hr tablet Take 1 tablet (25 mg total) by mouth daily.   Multiple Vitamins-Minerals (PRESERVISION AREDS 2 PO) Take by mouth daily.   mycophenolate (CELLCEPT) 500 MG tablet Take 1 tablet (500 mg total) by mouth 2 (two) times daily.   propranolol (INDERAL) 10 MG tablet Take 1 tablet (10 mg total) by mouth 3 (three) times daily. daily as needed (for breakthrough atrial fibrillation)   pyridostigmine (MESTINON) 60 MG tablet Take 1 tablet at 7 AM, 1 tablet at 1 PM and 1 tablet at 7 PM   rosuvastatin (CRESTOR) 5 MG tablet Take 1 tablet (5 mg total) by mouth daily.   Trolamine Salicylate (ASPERCREME EX) Apply topically as needed.   valsartan (DIOVAN) 80 MG tablet Take 40 mg by mouth daily.   XARELTO 20 MG TABS tablet TAKE 1 TABLET BY MOUTH ONCE A DAY WITH THE EVENING MEAL.   No facility-administered encounter medications  on file as of 01/22/2023.    PAST MEDICAL HISTORY: Past Medical History:  Diagnosis Date   Adenomatous colon polyp    Allergic rhinitis due to pollen    Anemia    Anxiety    Bronchiectasis ([place])    Complication of anesthesia    Degenerative disc disease    cervical and lumbar   Dysrhythmia    GERD (gastroesophageal reflux disease)    Hearing loss in right ear    Heart murmur    HOH (hard of hearing)    AIDS   Hyperlipidemia    Hypertension    Osteoarthritis, multiple sites    PAF (paroxysmal atrial fibrillation) ([place])    a. 01/2013 Echo [facility]): EF 60-65%, no rwma, mildly dil LA/RA, mild AI/TR, trace MR, nl RV size/fxn, mild PAH; b. CHA2DS2VASc = 4-->Chronic Xarelto.   PMR (polymyalgia rheumatica) (HCC)    PONV (postoperative nausea and vomiting)    Rheumatic fever    Sleep apnea    NO CPAP   Urge incontinence    Vertigo    Vertigo     PAST SURGICAL HISTORY: Past Surgical History:  Procedure Laterality Date   ABDOMINAL HYSTERECTOMY     ANKLE FRACTURE SURGERY Left 1996   BACK SURGERY     BREAST BIOPSY Left 2010   negative   CATARACT EXTRACTION W/PHACO Left 10/05/2018   Procedure: CATARACT EXTRACTION PHACO AND INTRAOCULAR LENS PLACEMENT ([place]);  Surgeon: Birder Robson, MD;  Location: [facility] ORS;  Service: Ophthalmology;  Laterality: Left;  [place] 00:40 CDE 6.92 fluid pack lot # [id number]      CATARACT EXTRACTION W/PHACO Right 10/26/2018   Procedure: CATARACT EXTRACTION PHACO AND INTRAOCULAR LENS PLACEMENT ([facility]);  Surgeon: Birder Robson, MD;  Location: [facility] ORS;  Service: Ophthalmology;  Laterality: Right;  [place]  00:47 CDE 7.11 Fluid pack lot # [id number]   DEXA  11/09/06   normal   FRACTURE SURGERY     Meningitis  1963   SPINE SURGERY     SYNOVECTOMY Right 1973   elbow   TONSILLECTOMY AND ADENOIDECTOMY      ALLERGIES: Allergies  Allergen Reactions   Actonel [Risedronate Sodium] Nausea And Vomiting   Boniva [Ibandronic Acid] Nausea And Vomiting    Diclofenac Other (See Comments)    Voltaren Gel aggravated her sinus passages   Fosamax [Alendronate Sodium] Nausea And Vomiting   Lipitor [Atorvastatin] Other (See Comments)    Muscle aches. Tolerates Crestor.   Miacalcin [Calcitonin ([place])] Other (See Comments)  Sneezing    Myrbetriq [Mirabegron] Other (See Comments)    Gastritis   Talwin [Pentazocine] Other (See Comments)    Hallucinations    Latex Rash    Sneezing, watery eyes.   Niaspan [Niacin Er] Swelling and Rash    FAMILY HISTORY: Family History  Problem Relation Age of Onset   Schizophrenia Mother        paranoid   Heart disease Mother        from psych meds   Arthritis Mother    Rheum arthritis Mother    Cancer Father        prostate   Arthritis Father    Diabetes Father    Breast cancer Paternal Aunt 82   Ovarian cancer Maternal Grandmother    Uterine cancer Maternal Grandmother    Colon cancer Maternal Grandmother    Healthy Son     SOCIAL HISTORY: Social History   Tobacco Use   Smoking status: Never    Passive exposure: Never   Smokeless tobacco: Never  Vaping Use   Vaping Use: Never used  Substance Use Topics   Alcohol use: Yes   Drug use: No   Social History   Social History Narrative   Retired 2007    1 son in [place] area   Spends part time in [place]      Has living will   Husband then son is health care POA--will need to be son with husband's decline   Not sure about DNR---will keep open resuscitation for now    No tube feedings if cognitively unaware   Right handed                  Objective:  Vital Signs:  There were no vitals taken for this visit.  ***  Labs and Imaging review: New results: 10/08/22: CBC and CMP wnl  Previously reviewed results: 08/27/22: Normal or unremarkable: CBC, CMP, TSH 08/15/22: Normal ESR and CRP   AChR binding ab: positive at 28.10   CT chest (05/15/22): FINDINGS: Cardiovascular: Moderate aortic atherosclerosis. No  aneurysm. Coronary vascular calcification. Normal cardiac size. No pericardial effusion   Mediastinum/Nodes: Midline trachea. No thyroid mass. Subcarinal node measuring 12 mm. Esophagus is normal.   Lungs/Pleura: Mild bronchiectasis in the right middle lobe and bilateral lower lobes with scarring. No acute airspace disease, pleural effusion or pneumothorax.   Upper Abdomen: No acute abnormality.   Musculoskeletal: No chest wall abnormality. No acute or significant osseous findings.   IMPRESSION: 1. Negative for mediastinal mass lesion. 2. Mild bronchiectasis in the right middle and bilateral lower lobes with pulmonary scarring  Assessment/Plan:  This is Jennifer Schmitt, a 88 y.o. female with: ***   Plan: ***  Return to clinic in ***  Total time spent reviewing records, interview, history/exam, documentation, and coordination of care on day of encounter:  *** min  Kai Levins, MD

## 2023-01-16 ENCOUNTER — Other Ambulatory Visit: Payer: Self-pay | Admitting: Neurology

## 2023-01-16 DIAGNOSIS — G7000 Myasthenia gravis without (acute) exacerbation: Secondary | ICD-10-CM

## 2023-01-19 DIAGNOSIS — M3501 Sicca syndrome with keratoconjunctivitis: Secondary | ICD-10-CM | POA: Diagnosis not present

## 2023-01-19 DIAGNOSIS — B301 Conjunctivitis due to adenovirus: Secondary | ICD-10-CM | POA: Diagnosis not present

## 2023-01-22 ENCOUNTER — Ambulatory Visit: Payer: Medicare PPO | Admitting: Neurology

## 2023-01-22 NOTE — Progress Notes (Deleted)
NEUROLOGY FOLLOW UP OFFICE NOTE  Jennifer Schmitt [id number]  Subjective:  Jennifer Schmitt is a 88 y.o. year old female with a history of seropositive myasthenia gravis, polymyalgia rheumatica, HTN, HLD, pAfib, and OA who we last saw on 10/08/22.   To briefly review: Patient was previously seen in this office by Dr. Tomi Likens (last seen on 06/05/22). Per initial clinic note on 05/08/22: Since January-February 2023, she has had progressive weakness.  She began having trouble rolling over in bed.  She was unable to get up and off her bed so she started sleeping on the couch..  Noted weakness in the left leg.  Seems to be dragging her right leg.  When standing, always with sensation that legs will give out. She has had falls and unable to stand up, requiring to call EMS.  Notes particular weakness in the proximal arms and trouble keeping her head up.  Started relying on a cane more frequently and then wheelchair.  She also endorsed worsening musculoskeletal pain with pain from the neck down her spine to her sacrum.  She does have degenerative spine disease and polymyalgia rheumatica.  MRI of brain with and without contrast on 04/27/2022 personally reviewed showed moderate chronic small vessel ischemic changes and known 1 x 2 mm vestibular schwannoma within the right internal auditory canal, stable compared to prior imaging in 2017.  She started noticing that her left eyelid was drooping.  She went to the ophthalmologist who suspected temporal arteritis given her history of PMR.  No vision loss.  Sed rate and CRP on 04/23/2022 were 101 and 10.1 respectively and was started on prednisone '60mg'$  daily with some improvement.  Based on strong likelihood of giant cell arteritis with elevated inflammatory markers and having PMR, temporal artery biopsy was not pursued.  She was started on prednisone '60mg'$  daily but discontinued after 3 days due to side effects.  She also has had increased urinary urgency and sometimes fecal  incontinence.  Due to sacral pain following a fall, she had X-ray of lumbar spine and MRI of sacrum which were negative for occult fracture.  AChR Binding Ab on 04/28/2022 was elevated at 28.10.  PCP started her on pyridostigmine '30mg'$  three times daily.  She notes improvement in strength in the legs.  However, she still has a head drop.  Denies double vision, trouble swallowing, trouble chewing, and trouble taking a breath.   At 05/08/22 visit, Dr. Tomi Likens recommended prednisone 20 mg daily, mestinon 60 mg TID (7 am, 1 pm, 7 pm). CT chest on 05/15/22 was negative for mediastinal mass (no evidence of thymoma). Patient discontinued prednisone shortly after starting as it made her jittery and not able to sleep. At follow up with Dr. Tomi Likens on 06/05/22, patient was not feeling well, but was walking a little better. She still had head drop. Hospital admission for a course of IVIg was suggested to prevent MG crisis, but patient declined as she needed to be home to take care of her husband with Alzheimer's disease. Cellcept 500 mg BID was started with IVIg every 28 days until Cellcept was therapeutic. She was also continued on mestinon. CBC and CMP were set up to be check weekly for 4 weeks, followed by monthly for 3 months. Patient called the next day though because she did not want IVIg. She decided to restart prednisone instead.   She felt much improved on 09/05/22. She is about 75% better than prior visit.   Patient lives at a retirement  with healthcare and PT/OT. She has many resources there. Patient went to PT and OT. She is currently done with both. Husband is currently in a memory care unit twice a week.   Current medications: Cellcept 500 mg BID, Prednisone 20 mg once in the morning, Mestinon 60 mg TID (7:30-8am, 12pm, 8-9pm).   Side effects: Cellcept is difficult to take on a empty stomach. She gets a little nausea but this is moderately improved. She has hot flashes or temperature fluctuations that are  episodic. She is otherwise not having significant side effects.   MG symptoms on 09/05/22: Ptosis: Some drooping on left, pretty constant; improved compared to prior, worse when tired, not bothersome Double vision: Never Speech: Occasionally slurred speech, especially when tired; no one else commenting on it per patient Chewing: No problems currently Swallowing: Occasionally, episodic difficulty requiring a double swallow; no coughing Breathing: No problems currently Arm strength: No problems; Neck is still weak, but much improved. Cannot sit up straight for long periods of time Leg strength: Walks with a walker. She feels like her legs are strong. She has a walker due to not being able to stand up right.   At her visit on 09/05/22, we continued prednisone 20 mg daily, Cellcept 500 mg BID, but started tapering her mestinon (stopped on 09/11/22). I called the patient on 09/12/22 at which time she noted no change in her symptoms off mestinon. As a result, we agreed to begin tapering her prednisone. She decreased to 15 mg on 09/13/22.  Most recent Assessment and Plan (10/08/22): This is Jennifer Schmitt, a 88 y.o. female with:   AChR ab positive generalized myasthenia gravis. CT chest on 05/13/22 with no mediastinal mass.   Plan: -CBC and CMP today -Continue Cellcept 500 mg BID -Continue prednisone 15 mg daily for a total of 2 months (until 11/13/22), then instructed to decrease to 10 mg daily until follow up -Patient instructed to increase back to 15 mg if MG symptoms worsen and call the office. -Mestinon 60 mg as needed -Continue staying active -Discussed MG crisis, what to watch for, and when to go to nearest ED including difficulty breathing or swallowing  Since their last visit: ***  Current MG symptoms: Ptosis: *** Double vision: *** Speech: *** Chewing: *** Swallowing: *** Breathing: *** Arm strength: *** Leg strength: ***    MEDICATIONS:  Outpatient Encounter Medications as  of 01/23/2023  Medication Sig   acetaminophen (TYLENOL) 500 MG tablet Take 1,000 mg by mouth as needed.   amiodarone (PACERONE) 200 MG tablet Take 200 mg by mouth daily.   calcium carbonate (CALCIUM 600) 600 MG TABS tablet Take 1 tablet (600 mg total) by mouth daily.   Cholecalciferol (VITAMIN D) 2000 units CAPS Take 2,000 Units by mouth daily.   cyanocobalamin (VITAMIN B12) 1000 MCG/ML injection INJECT 1 ML MONTHLY AS DIRECTED   diltiazem (CARDIZEM) 30 MG tablet Take 1 tablet (30 mg total) by mouth 3 (three) times daily as needed (atrial fibrillation lasting more than 2 hours).   dimenhyDRINATE (DRAMAMINE PO) Take by mouth.   fluticasone (FLONASE) 50 MCG/ACT nasal spray Place 1 spray into both nostrils daily.   folic acid (FOLVITE) 1 MG tablet Take 1 tablet (1 mg total) by mouth daily.   hydrocortisone 2.5 % cream Apply topically 3 (three) times daily as needed.   lansoprazole (PREVACID) 15 MG capsule TAKE 1 CAPSULE BY MOUTH DAILY AT 12 (NOON)   LORazepam (ATIVAN) 0.5 MG tablet TAKE ONE HALF TO 1  TABLET BY MOUTH TWICEDAILY AS NEEDED FOR ANXIETY   metoprolol succinate (TOPROL XL) 25 MG 24 hr tablet Take 1 tablet (25 mg total) by mouth daily.   Multiple Vitamins-Minerals (PRESERVISION AREDS 2 PO) Take by mouth daily.   mycophenolate (CELLCEPT) 500 MG tablet Take 1 tablet (500 mg total) by mouth 2 (two) times daily.   propranolol (INDERAL) 10 MG tablet Take 1 tablet (10 mg total) by mouth 3 (three) times daily. daily as needed (for breakthrough atrial fibrillation)   pyridostigmine (MESTINON) 60 MG tablet Take 1 tablet at 7 AM, 1 tablet at 1 PM and 1 tablet at 7 PM   rosuvastatin (CRESTOR) 5 MG tablet Take 1 tablet (5 mg total) by mouth daily.   Trolamine Salicylate (ASPERCREME EX) Apply topically as needed.   valsartan (DIOVAN) 80 MG tablet Take 40 mg by mouth daily.   XARELTO 20 MG TABS tablet TAKE 1 TABLET BY MOUTH ONCE A DAY WITH THE EVENING MEAL.   No facility-administered encounter  medications on file as of 01/23/2023.    PAST MEDICAL HISTORY: Past Medical History:  Diagnosis Date   Adenomatous colon polyp    Allergic rhinitis due to pollen    Anemia    Anxiety    Bronchiectasis ([place])    Complication of anesthesia    Degenerative disc disease    cervical and lumbar   Dysrhythmia    GERD (gastroesophageal reflux disease)    Hearing loss in right ear    Heart murmur    HOH (hard of hearing)    AIDS   Hyperlipidemia    Hypertension    Osteoarthritis, multiple sites    PAF (paroxysmal atrial fibrillation) ([place])    a. 01/2013 Echo [facility]): EF 60-65%, no rwma, mildly dil LA/RA, mild AI/TR, trace MR, nl RV size/fxn, mild PAH; b. CHA2DS2VASc = 4-->Chronic Xarelto.   PMR (polymyalgia rheumatica) (HCC)    PONV (postoperative nausea and vomiting)    Rheumatic fever    Sleep apnea    NO CPAP   Urge incontinence    Vertigo    Vertigo     PAST SURGICAL HISTORY: Past Surgical History:  Procedure Laterality Date   ABDOMINAL HYSTERECTOMY     ANKLE FRACTURE SURGERY Left 1996   BACK SURGERY     BREAST BIOPSY Left 2010   negative   CATARACT EXTRACTION W/PHACO Left 10/05/2018   Procedure: CATARACT EXTRACTION PHACO AND INTRAOCULAR LENS PLACEMENT ([place]);  Surgeon: Birder Robson, MD;  Location: [facility] ORS;  Service: Ophthalmology;  Laterality: Left;  [place] 00:40 CDE 6.92 fluid pack lot # [id number]      CATARACT EXTRACTION W/PHACO Right 10/26/2018   Procedure: CATARACT EXTRACTION PHACO AND INTRAOCULAR LENS PLACEMENT ([facility]);  Surgeon: Birder Robson, MD;  Location: [facility] ORS;  Service: Ophthalmology;  Laterality: Right;  [place]  00:47 CDE 7.11 Fluid pack lot # [id number]   DEXA  11/09/06   normal   FRACTURE SURGERY     Meningitis  1963   SPINE SURGERY     SYNOVECTOMY Right 1973   elbow   TONSILLECTOMY AND ADENOIDECTOMY      ALLERGIES: Allergies  Allergen Reactions   Actonel [Risedronate Sodium] Nausea And Vomiting   Boniva [Ibandronic Acid] Nausea And  Vomiting   Diclofenac Other (See Comments)    Voltaren Gel aggravated her sinus passages   Fosamax [Alendronate Sodium] Nausea And Vomiting   Lipitor [Atorvastatin] Other (See Comments)    Muscle aches. Tolerates Crestor.   Miacalcin [Calcitonin ([place])] Other (  See Comments)    Sneezing    Myrbetriq [Mirabegron] Other (See Comments)    Gastritis   Talwin [Pentazocine] Other (See Comments)    Hallucinations    Latex Rash    Sneezing, watery eyes.   Niaspan [Niacin Er] Swelling and Rash    FAMILY HISTORY: Family History  Problem Relation Age of Onset   Schizophrenia Mother        paranoid   Heart disease Mother        from psych meds   Arthritis Mother    Rheum arthritis Mother    Cancer Father        prostate   Arthritis Father    Diabetes Father    Breast cancer Paternal Aunt 57   Ovarian cancer Maternal Grandmother    Uterine cancer Maternal Grandmother    Colon cancer Maternal Grandmother    Healthy Son     SOCIAL HISTORY: Social History   Tobacco Use   Smoking status: Never    Passive exposure: Never   Smokeless tobacco: Never  Vaping Use   Vaping Use: Never used  Substance Use Topics   Alcohol use: Yes   Drug use: No   Social History   Social History Narrative   Retired 2007    1 son in [place] area   Spends part time in [place]      Has living will   Husband then son is health care POA--will need to be son with husband's decline   Not sure about DNR---will keep open resuscitation for now    No tube feedings if cognitively unaware   Right handed                  Objective:  Vital Signs:  There were no vitals taken for this visit.  ***  Labs and Imaging review: New results: 10/08/22: CBC and CMP wnl   Previously reviewed results: 08/27/22: Normal or unremarkable: CBC, CMP, TSH 08/15/22: Normal ESR and CRP   AChR binding ab: positive at 28.10   CT chest (05/15/22): FINDINGS: Cardiovascular: Moderate aortic atherosclerosis. No  aneurysm. Coronary vascular calcification. Normal cardiac size. No pericardial effusion   Mediastinum/Nodes: Midline trachea. No thyroid mass. Subcarinal node measuring 12 mm. Esophagus is normal.   Lungs/Pleura: Mild bronchiectasis in the right middle lobe and bilateral lower lobes with scarring. No acute airspace disease, pleural effusion or pneumothorax.   Upper Abdomen: No acute abnormality.   Musculoskeletal: No chest wall abnormality. No acute or significant osseous findings.   IMPRESSION: 1. Negative for mediastinal mass lesion. 2. Mild bronchiectasis in the right middle and bilateral lower lobes with pulmonary scarring  Assessment/Plan:  This is HYATT BRODRICK, a 88 y.o. female with AChR ab positive generalized myasthenia gravis (symptom onset 12/2021 with diagnosis 04/28/2022). CT chest on 05/13/22 with no mediastinal mass.   Plan: -Continue Cellcept 500 mg BID -Continue prednisone 10 mg daily*** -Patient instructed to increase back to 10 mg if MG symptoms worsen and call the office. -Mestinon 60 mg as needed -Continue staying active -Discussed MG crisis, what to watch for, and when to go to nearest ED including difficulty breathing or swallowing  Return to clinic in ***6 months  Total time spent reviewing records, interview, history/exam, documentation, and coordination of care on day of encounter:  *** min  Kai Levins, MD

## 2023-01-23 ENCOUNTER — Encounter: Payer: Self-pay | Admitting: Neurology

## 2023-01-23 ENCOUNTER — Ambulatory Visit: Payer: Medicare PPO | Admitting: Neurology

## 2023-01-26 ENCOUNTER — Telehealth (INDEPENDENT_AMBULATORY_CARE_PROVIDER_SITE_OTHER): Payer: Medicare PPO | Admitting: Family Medicine

## 2023-01-26 ENCOUNTER — Encounter: Payer: Self-pay | Admitting: Family Medicine

## 2023-01-26 VITALS — Ht 65.75 in

## 2023-01-26 DIAGNOSIS — U071 COVID-19: Secondary | ICD-10-CM | POA: Diagnosis not present

## 2023-01-26 MED ORDER — GUAIFENESIN-CODEINE 100-10 MG/5ML PO SYRP: 5.0000 mL | ORAL_SOLUTION | Freq: Three times a day (TID) | ORAL | 0 refills | Status: DC | PRN

## 2023-01-26 NOTE — Progress Notes (Signed)
Hermelinda Diegel T. Phuoc Huy, MD, [place] [facility] [place], [address]  Phone: [phone]  FAX: [phone]  Jennifer Schmitt - 88 y.o. female  MRN [id number]  Date of Birth: 1935/02/16  Date: 01/26/2023  PCP: Venia Carbon, MD  Referral: Venia Carbon, MD  Chief Complaint  Patient presents with   Covid Positive    Positive Test last Monday and Friday Husband currently in hospital with Covid   Nasal Congestion   Headache   Sore Throat   Fatigue   Dry Eyes   Vomiting   Diarrhea   Cough   Shortness of Breath        Virtual Visit via Video Note:  I connected with  Jennifer Schmitt on 01/26/2023 12:00 PM EST by a video enabled telemedicine application and verified that I am speaking with the correct person using two identifiers.   Location patient: home computer, tablet, or smartphone Location provider: work or home office Consent: Verbal consent directly obtained from Jennifer Schmitt. Persons participating in the virtual visit: patient, provider  I discussed the limitations of evaluation and management by telemedicine and the availability of in person appointments. The patient expressed understanding and agreed to proceed.  Chief Complaint  Patient presents with   Covid Positive    Positive Test last Monday and Friday Husband currently in hospital with Covid   Nasal Congestion   Headache   Sore Throat   Fatigue   Dry Eyes   Vomiting   Diarrhea   Cough   Shortness of Breath         History of Present Illness:  Covid +  She has a number of ongoing symptoms including significant nasal congestion, headache, sore throat, fatigue, dry eyes, vomiting, nausea, diarrhea, cough and she also has some shortness of breath. Nausea and diarrhea -nausea has been fairly short-lived, and she did have diarrhea this morning. Feels hot all of the time.   Voice is raspy Energy is low - does not last  very long Has not had much of a cough  Review of Systems as above: See pertinent positives and pertinent negatives per HPI No acute distress verbally   Observations/Objective/Exam:  An attempt was made to discern vital signs over the phone and per patient if applicable and possible.   General:    Alert, Oriented, appears well and in no acute distress  Pulmonary:     On inspection no signs of respiratory distress.  Psych / Neurological:     Pleasant and cooperative.  Assessment and Plan:    ICD-10-CM   1. COVID-19  U07.1      Acute COVID-19 with 7 days of onset of symptoms.  I discussed the assessment and treatment plan with the patient. The patient was provided an opportunity to ask questions and all were answered. The patient agreed with the plan and demonstrated an understanding of the instructions.   The patient was advised to call back or seek an in-person evaluation if the symptoms worsen or if the condition fails to improve as anticipated.  Follow-up: prn unless noted otherwise below No follow-ups on file.  Meds ordered this encounter  Medications   guaiFENesin-codeine (ROBITUSSIN AC) 100-10 MG/5ML syrup    Sig: Take 5 mLs by mouth 3 (three) times daily as needed for cough.    Dispense:  120 mL    Refill:  0   No orders of the defined types  were placed in this encounter.   Signed,  Maud Deed. Ashtian Villacis, MD

## 2023-01-29 ENCOUNTER — Telehealth: Payer: Self-pay | Admitting: Neurology

## 2023-01-29 NOTE — Telephone Encounter (Signed)
Called patient. She recently had to cancel our follow up appointment due to illness (currently has COVID). Her f/u was rescheduled to 04/2023. I recommended she come in sooner. She agreed to 02/12/23 at 9 am which is currently open for me. Overall, she mentions her MG is doing well and her COVID symptoms are not too bad.  All questions were answered.  Kai Levins, MD [facility]

## 2023-01-30 ENCOUNTER — Telehealth: Payer: Self-pay | Admitting: Internal Medicine

## 2023-01-30 NOTE — Telephone Encounter (Signed)
Tried to call pt. No answer. I did not realize Dr Silvio Pate had already seen his messages prior to me sending him this message.

## 2023-01-30 NOTE — Telephone Encounter (Signed)
Spoke to pt. Advised her I spoke to Dr Danise Mina as I did not want to suggest something with her h/o Myasthenia gravis. He looked it up and says the only thing to use with caution is Claritin and her Flonase. If she starts to have symptoms of MG, she needs to stop the med immediately.   I called her back and advised her what Dr Danise Mina said. She appreciated the call and suggestion.

## 2023-01-30 NOTE — Telephone Encounter (Signed)
Patient returned call,would like a cb

## 2023-01-30 NOTE — Telephone Encounter (Signed)
Patient was seen on a virtual appointment on 01/26/2023,and was prescribed guaiFENesin-codeine (ROBITUSSIN AC) 100-10 MG/5ML syrup . She called in today asking could anything else be called in for her? She's still experiencing congestion,itchy eyes,headache,low energy.

## 2023-02-02 ENCOUNTER — Telehealth: Payer: Self-pay

## 2023-02-02 NOTE — Telephone Encounter (Signed)
Jennifer Carbon, MD  to Me  Silvio Pate Pool     02/02/23  1:16 PM Got it Only thing that might also help would be Neti pot or nasal lavage. A humidifier may help with the nasal congestion as well    Pt notified as instructed and voiced understanding. Pt said she is using cool air humidifier and has a neti pot but has not used that yet. Pt will get out  neti pot and see if that will help. Pt will cb if needed. UC & ED precautions reviewed and pt voiced understanding.

## 2023-02-02 NOTE — Telephone Encounter (Signed)
Pt notified as instructed by Dr Silvio Pate and pt voiced understanding. Pt said these are not new symptoms but ongoing symptoms since first dx with covid +. Pt said did VV with Dr Lorelei Pont on 01/26/23 and was already 5 days out from symptoms starting and pt said she was advised at that point to late to start on antiviral. Pt said any other suggestions to call pt back. Sending note to Dr Silvio Pate.

## 2023-02-02 NOTE — Telephone Encounter (Addendum)
I spoke with pt and she said she is a little better but still experiencing a lot of head congestion. Pt said claritin and flonase are not helping a lot and wondered if Dr Silvio Pate had further suggestions. Offered pt an appt at [facility] and pt said no not until she is out of quarantine and just wondered if any other med suggestions. UC & ED precautions given and pt voiced understanding.Pt request cb after reviewed by Dr Silvio Pate. Sending note to Dr Silvio Pate and Silvio Pate pool. Clifford.     [place] [facility] - Client TELEPHONE ADVICE RECORD [facility] Patient Name: Jennifer Schmitt RIGHT Gender: Female DOB: 1935-10-08 Age: 88 Y 42 M 25 D Return Phone Number: [phone] (Primary), [phone] (Secondary) Address: City/ State/ Zip: [place]  [address] Client [place] [facility] - Client Client Site [place] - Night Contact Type Call Who Is Calling Patient / Member / Family / Caregiver Call Type Triage / Clinical Relationship To Patient Self Return Phone Number [phone] (Primary) Chief Complaint Cough Reason for Call Symptomatic / Request for [place] states that she has Covid and she is patient Jennifer Schmitt. She is having symptoms of stuffy nose, her body is aching, coughing. She has taken cough syrup and Tylenol. Her eyes are burning and itching, and her throat is itchy and she also is experiencing Malaise. Translation No Nurse Assessment Nurse: Mariel Sleet, RN, Erasmo Downer Date/Time (Eastern Time): 01/31/2023 12:18:50 PM Confirm and document reason for call. If symptomatic, describe symptoms. ---Caller states that she tested positive for covid this morning, cough, stuffy nose and body aches. Does the patient have any new or worsening symptoms? ---Yes Will a triage be completed? ---Yes Related visit to physician within the last 2 weeks? ---Yes Does the PT have any chronic  conditions? (i.e. diabetes, asthma, this includes High risk factors for pregnancy, etc.) ---Yes List chronic conditions. ---myasthenia gravis Is this a behavioral health or substance abuse call? ---No Guidelines Guideline Title Affirmed Question Affirmed Notes Nurse Date/Time (Eastern Time) COVID-19 - Diagnosed or Suspected [1] HIGH RISK patient (e.g., weak immune system, age > 26 years, obesity with BMI 30 or higher, pregnant, chronic lung disease or other chronic medical condition) AND [2] COVID Donadeo, RN, Erasmo Downer 01/31/2023 12:21:12 PM PLEASE NOTE: All timestamps contained within this report are represented as [place] Standard Time. CONFIDENTIALTY NOTICE: This fax transmission is intended only for the addressee. It contains information that is legally privileged, confidential or otherwise protected from use or disclosure. If you are not the intended recipient, you are strictly prohibited from reviewing, disclosing, copying using or disseminating any of this information or taking any action in reliance on or regarding this information. If you have received this fax in error, please notify us immediately by telephone so that we can arrange for its return to [place]. Phone: [phone], Toll-Free: [phone], Fax: [phone] Page: 2 of 2 Call Id: [id number] Guidelines Guideline Title Affirmed Question Affirmed Notes Nurse Date/Time Eilene Ghazi Time) symptoms (e.g., cough, fever) (Exceptions: Already seen by PCP and no new or worsening symptoms.) Disp. Time Eilene Ghazi Time) Disposition Final User 01/31/2023 12:25:59 PM Call PCP within 24 Hours Yes Mariel Sleet, RN, Erasmo Downer Final Disposition 01/31/2023 12:25:59 PM Call PCP within 24 Hours Yes Mariel Sleet, RN, Laurin Coder Disagree/Comply Comply Caller Understands Yes PreDisposition Call Doctor Care Advice Given Per Guideline CALL PCP WITHIN 24 HOURS: * You need to discuss this with your doctor (or NP/PA) within the next 24  hours. *  IF OFFICE WILL BE OPEN: Call the office when it opens tomorrow morning. ALTERNATE DISPOSITION - TELEMEDICINE WITHIN 24 HOURS: * Telemedicine may be your best choice for care during this COVID-19 outbreak. * You should call a telemedicine doctor (or NP/PA) within the next 24 hours, if your own doctor is not available. COUGH MEDICINES: * COUGH DROPS: Overthe-counter cough drops can help a lot, especially for mild coughs. They soothe an irritated throat and remove the tickle sensation in the back of the throat. Cough drops are easy to carry with you. COUGH SYRUP WITH DEXTROMETHORPHAN: * Cough syrups containing the cough suppressant dextromethorphan may help decrease your cough. FEVER MEDICINES: * ACETAMINOPHEN - REGULAR STRENGTH TYLENOL: Take 650 mg (two 325 mg pills) by mouth every 4 to 6 hours as needed. Each Regular Strength Tylenol pill has 325 mg of acetaminophen. The most you should take is 10 pills a day (3,250 mg total). Note: In [place], the maximum is 12 pills a day (3,900 mg total). CALL BACK IF: * You become worse CARE ADVICE given per COVID-19 - DIAGNOSED OR SUSPECTED (Adult) guideline. Referrals REFERRED TO PCP OFFIC

## 2023-02-02 NOTE — Telephone Encounter (Signed)
Called to check on pt. Said she could not remember the call conversation on the 8th from Dr. Berdine Addison , she was sick. She is going ok still has drainage from Covid. MG is ok. Reminded of appointment on 2/27. Told her to call if concerns or questions.

## 2023-02-03 ENCOUNTER — Other Ambulatory Visit: Payer: Self-pay | Admitting: Internal Medicine

## 2023-02-03 ENCOUNTER — Telehealth: Payer: Self-pay | Admitting: Cardiovascular Disease

## 2023-02-03 ENCOUNTER — Other Ambulatory Visit: Payer: Self-pay | Admitting: Cardiovascular Disease

## 2023-02-03 MED ORDER — VALSARTAN 80 MG PO TABS: 40.0000 mg | ORAL_TABLET | Freq: Every day | ORAL | 1 refills | Status: DC

## 2023-02-03 NOTE — Telephone Encounter (Signed)
Refill Request.  

## 2023-02-03 NOTE — Telephone Encounter (Signed)
*  STAT* If patient is at the pharmacy, call can be transferred to refill team.   1. Which medications need to be refilled? (please list name of each medication and dose if known)   valsartan (DIOVAN) 80 MG tablet   2. Which pharmacy/location (including street and city if local pharmacy) is medication to be sent to?  [place], [place]   3. Do they need a 30 day or 90 day supply?   90 day  Caller stated patient has 4 days left of this medication.

## 2023-02-03 NOTE — Telephone Encounter (Signed)
Prescription refill request for Xarelto received.  Indication: AF Last office visit: 08/27/22  Grayce Sessions MD Weight: 72.4kg Age: 88 Scr: 0.73 on 10/08/22 CrCl: 64.40  Based on above findings Xarelto 63m daily is the appropriate dose.  Refill approved.

## 2023-02-04 NOTE — Progress Notes (Signed)
NEUROLOGY FOLLOW UP OFFICE NOTE  Jennifer Schmitt [id number]  Subjective:  Jennifer Schmitt is a 88 y.o. year old female with a history of seropositive myasthenia gravis, polymyalgia rheumatica, HTN, HLD, pAfib, and OA who we last saw on 10/08/22.   To briefly review: Patient was previously seen in this office by Dr. Tomi Likens (last seen on 06/05/22). Per initial clinic note on 05/08/22: Since January-February 2023, she has had progressive weakness.  She began having trouble rolling over in bed.  She was unable to get up and off her bed so she started sleeping on the couch..  Noted weakness in the left leg.  Seems to be dragging her right leg.  When standing, always with sensation that legs will give out. She has had falls and unable to stand up, requiring to call EMS.  Notes particular weakness in the proximal arms and trouble keeping her head up.  Started relying on a cane more frequently and then wheelchair.  She also endorsed worsening musculoskeletal pain with pain from the neck down her spine to her sacrum.  She does have degenerative spine disease and polymyalgia rheumatica.  MRI of brain with and without contrast on 04/27/2022 personally reviewed showed moderate chronic small vessel ischemic changes and known 1 x 2 mm vestibular schwannoma within the right internal auditory canal, stable compared to prior imaging in 2017.  She started noticing that her left eyelid was drooping.  She went to the ophthalmologist who suspected temporal arteritis given her history of PMR.  No vision loss.  Sed rate and CRP on 04/23/2022 were 101 and 10.1 respectively and was started on prednisone 49m daily with some improvement.  Based on strong likelihood of giant cell arteritis with elevated inflammatory markers and having PMR, temporal artery biopsy was not pursued.  She was started on prednisone 675mdaily but discontinued after 3 days due to side effects.  She also has had increased urinary urgency and sometimes fecal  incontinence.  Due to sacral pain following a fall, she had X-ray of lumbar spine and MRI of sacrum which were negative for occult fracture.  AChR Binding Ab on 04/28/2022 was elevated at 28.10.  PCP started her on pyridostigmine 3050mhree times daily.  She notes improvement in strength in the legs.  However, she still has a head drop.  Denies double vision, trouble swallowing, trouble chewing, and trouble taking a breath.   At 05/08/22 visit, Dr. JafTomi Likenscommended prednisone 20 mg daily, mestinon 60 mg TID (7 am, 1 pm, 7 pm). CT chest on 05/15/22 was negative for mediastinal mass (no evidence of thymoma). Patient discontinued prednisone shortly after starting as it made her jittery and not able to sleep. At follow up with Dr. JafTomi Likens 06/05/22, patient was not feeling well, but was walking a little better. She still had head drop. Hospital admission for a course of IVIg was suggested to prevent MG crisis, but patient declined as she needed to be home to take care of her husband with Alzheimer's disease. Cellcept 500 mg BID was started with IVIg every 28 days until Cellcept was therapeutic. She was also continued on mestinon. CBC and CMP were set up to be check weekly for 4 weeks, followed by monthly for 3 months. Patient called the next day though because she did not want IVIg. She decided to restart prednisone instead.   She felt much improved on 09/05/22. She is about 75% better than prior visit.   Patient lives at a retirement  with healthcare and PT/OT. She has many resources there. Patient went to PT and OT. She is currently done with both. Husband is currently in a memory care unit twice a week.   Current medications: Cellcept 500 mg BID, Prednisone 20 mg once in the morning, Mestinon 60 mg TID (7:30-8am, 12pm, 8-9pm).   Side effects: Cellcept is difficult to take on a empty stomach. She gets a little nausea but this is moderately improved. She has hot flashes or temperature fluctuations that are  episodic. She is otherwise not having significant side effects.   MG symptoms on 09/05/22: Ptosis: Some drooping on left, pretty constant; improved compared to prior, worse when tired, not bothersome Double vision: Never Speech: Occasionally slurred speech, especially when tired; no one else commenting on it per patient Chewing: No problems currently Swallowing: Occasionally, episodic difficulty requiring a double swallow; no coughing Breathing: No problems currently Arm strength: No problems; Neck is still weak, but much improved. Cannot sit up straight for long periods of time Leg strength: Walks with a walker. She feels like her legs are strong. She has a walker due to not being able to stand up right.   At her visit on 09/05/22, we continued prednisone 20 mg daily, Cellcept 500 mg BID, but started tapering her mestinon (stopped on 09/11/22). I called the patient on 09/12/22 at which time she noted no change in her symptoms off mestinon. As a result, we agreed to begin tapering her prednisone. She decreased to 15 mg on 09/13/22.   Most recent Assessment and Plan (10/08/22): This is Jennifer Schmitt, a 88 y.o. female with:   AChR ab positive generalized myasthenia gravis. CT chest on 05/13/22 with no mediastinal mass.   Plan: -CBC and CMP today -Continue Cellcept 500 mg BID -Continue prednisone 15 mg daily for a total of 2 months (until 11/13/22), then instructed to decrease to 10 mg daily until follow up -Patient instructed to increase back to 15 mg if MG symptoms worsen and call the office. -Mestinon 60 mg as needed -Continue staying active -Discussed MG crisis, what to watch for, and when to go to nearest ED including difficulty breathing or swallowing  Since their last visit: Patient recently had COVID. She has recovered.   Current MG symptoms: Ptosis: None. Has some floaters, but following with eye doctor about Double vision: None Speech: Occasionally she thinks she sounds slurred,  but when she asks others, they say they do not notice Chewing: No problems Swallowing: Has some mucous, but no difficulty with swallowing Breathing: Had some difficulty during COVID and with allergies; no significant shortness of breath or orthopnea Arm strength: No difficulties Leg strength: No difficulties   Current MG medications: Cellcept 500 mg BID, prednisone 10 mg daily. She is not taking mestinon currently.  She mentions show numbness today in bilateral 5th digits in the hand.  MEDICATIONS:  Outpatient Encounter Medications as of 02/12/2023  Medication Sig   acetaminophen (TYLENOL) 500 MG tablet Take 1,000 mg by mouth as needed.   amiodarone (PACERONE) 200 MG tablet Take 200 mg by mouth daily.   BD SYRINGE SLIP TIP 25G X 5/8" 1 ML MISC    calcium carbonate (CALCIUM 600) 600 MG TABS tablet Take 1 tablet (600 mg total) by mouth daily.   Cholecalciferol (VITAMIN D) 2000 units CAPS Take 2,000 Units by mouth daily.   cyanocobalamin (VITAMIN B12) 1000 MCG/ML injection INJECT 1 ML MONTHLY AS DIRECTED   diltiazem (CARDIZEM) 30 MG tablet Take 1 tablet (  30 mg total) by mouth 3 (three) times daily as needed (atrial fibrillation lasting more than 2 hours).   dimenhyDRINATE (DRAMAMINE PO) Take by mouth.   fluticasone (FLONASE) 50 MCG/ACT nasal spray Place 1 spray into both nostrils daily.   folic acid (FOLVITE) 1 MG tablet TAKE 1 TABLET BY MOUTH DAILY   guaiFENesin-codeine (ROBITUSSIN AC) 100-10 MG/5ML syrup Take 5 mLs by mouth 3 (three) times daily as needed for cough.   hydrocortisone 2.5 % cream Apply topically 3 (three) times daily as needed.   lansoprazole (PREVACID) 15 MG capsule TAKE 1 CAPSULE BY MOUTH DAILY AT 12 (NOON)   LORazepam (ATIVAN) 0.5 MG tablet TAKE ONE HALF TO 1 TABLET BY MOUTH TWICEDAILY AS NEEDED FOR ANXIETY   metoprolol succinate (TOPROL XL) 25 MG 24 hr tablet Take 1 tablet (25 mg total) by mouth daily.   MIEBO 1.338 GM/ML SOLN Apply 1 drop to eye daily.   Multiple  Vitamins-Minerals (PRESERVISION AREDS 2 PO) Take by mouth daily.   mycophenolate (CELLCEPT) 500 MG tablet Take 1 tablet (500 mg total) by mouth 2 (two) times daily.   propranolol (INDERAL) 10 MG tablet Take 1 tablet (10 mg total) by mouth 3 (three) times daily. daily as needed (for breakthrough atrial fibrillation)   pyridostigmine (MESTINON) 60 MG tablet Take 1 tablet at 7 AM, 1 tablet at 1 PM and 1 tablet at 7 PM   rosuvastatin (CRESTOR) 5 MG tablet Take 1 tablet (5 mg total) by mouth daily.   Trolamine Salicylate (ASPERCREME EX) Apply topically as needed.   valsartan (DIOVAN) 80 MG tablet Take 0.5 tablets (40 mg total) by mouth daily.   XARELTO 20 MG TABS tablet TAKE 1 TABLET BY MOUTH ONCE A DAY WITH THE EVENING MEAL.   No facility-administered encounter medications on file as of 02/12/2023.    PAST MEDICAL HISTORY: Past Medical History:  Diagnosis Date   Adenomatous colon polyp    Allergic rhinitis due to pollen    Anemia    Anxiety    Bronchiectasis ([place])    Complication of anesthesia    Degenerative disc disease    cervical and lumbar   Dysrhythmia    GERD (gastroesophageal reflux disease)    Hearing loss in right ear    Heart murmur    HOH (hard of hearing)    AIDS   Hyperlipidemia    Hypertension    Osteoarthritis, multiple sites    PAF (paroxysmal atrial fibrillation) ([place])    a. 01/2013 [facility]): EF 60-65%, no rwma, mildly dil LA/RA, mild AI/TR, trace MR, nl RV size/fxn, mild PAH; b. CHA2DS2VASc = 4-->Chronic Xarelto.   PMR (polymyalgia rheumatica) ([facility])    PONV (postoperative nausea and vomiting)    Rheumatic fever    Sleep apnea    NO CPAP   Urge incontinence    Vertigo    Vertigo     PAST SURGICAL HISTORY: Past Surgical History:  Procedure Laterality Date   ABDOMINAL HYSTERECTOMY     ANKLE FRACTURE SURGERY Left 1996   BACK SURGERY     BREAST BIOPSY Left 2010   negative   CATARACT EXTRACTION W/PHACO Left 10/05/2018   Procedure: CATARACT  EXTRACTION PHACO AND INTRAOCULAR LENS PLACEMENT ([place]);  Surgeon: Birder Robson, MD;  Location: [facility] ORS;  Service: Ophthalmology;  Laterality: Left;  [place] 00:40 CDE 6.92 fluid pack lot # [id number]      CATARACT EXTRACTION W/PHACO Right 10/26/2018   Procedure: CATARACT EXTRACTION PHACO AND INTRAOCULAR LENS PLACEMENT ([facility]);  Surgeon: Birder Robson, MD;  Location: [facility] ORS;  Service: Ophthalmology;  Laterality: Right;  [place]  00:47 CDE 7.11 Fluid pack lot # [id number]   DEXA  11/09/06   normal   FRACTURE SURGERY     Meningitis  1963   SPINE SURGERY     SYNOVECTOMY Right 1973   elbow   TONSILLECTOMY AND ADENOIDECTOMY      ALLERGIES: Allergies  Allergen Reactions   Actonel [Risedronate Sodium] Nausea And Vomiting   Boniva [Ibandronic Acid] Nausea And Vomiting   Diclofenac Other (See Comments)    Voltaren Gel aggravated her sinus passages   Fosamax [Alendronate Sodium] Nausea And Vomiting   Lipitor [Atorvastatin] Other (See Comments)    Muscle aches. Tolerates Crestor.   Miacalcin [Calcitonin ([place])] Other (See Comments)    Sneezing    Myrbetriq [Mirabegron] Other (See Comments)    Gastritis   Talwin [Pentazocine] Other (See Comments)    Hallucinations    Latex Rash    Sneezing, watery eyes.   Niaspan [Niacin Er] Swelling and Rash    FAMILY HISTORY: Family History  Problem Relation Age of Onset   Schizophrenia Mother        paranoid   Heart disease Mother        from psych meds   Arthritis Mother    Rheum arthritis Mother    Cancer Father        prostate   Arthritis Father    Diabetes Father    Breast cancer Paternal Aunt 97   Ovarian cancer Maternal Grandmother    Uterine cancer Maternal Grandmother    Colon cancer Maternal Grandmother    Healthy Son     SOCIAL HISTORY: Social History   Tobacco Use   Smoking status: Never    Passive exposure: Never   Smokeless tobacco: Never  Vaping Use   Vaping Use: Never used  Substance Use Topics   Alcohol use: Yes    Drug use: No   Social History   Social History Narrative   Retired 2007    1 son in [place] area   Spends part time in [place]      Has living will   Husband then son is health care POA--will need to be son with husband's decline   Not sure about DNR---will keep open resuscitation for now    No tube feedings if cognitively unaware   Right handed                  Objective:  Vital Signs:  BP (!) 177/76   Pulse 61   Ht 5' 5"$  (1.651 m)   Wt 155 lb (70.3 kg)   SpO2 99%   BMI 25.79 kg/m   General: General appearance: Awake and alert. No distress. Cooperative with exam.  HEENT: Atraumatic. Anicteric. Lungs: Non-labored breathing on room air  Psych: Affect appropriate.  Neurological: Mental Status: Alert. Speech fluent. No pseudobulbar affect Cranial Nerves: CNII: No RAPD. Visual fields intact. CNIII, IV, VI: PERRL. No nystagmus. EOMI. No diplopia with sustained up gaze. CN V: Facial sensation intact bilaterally to fine touch. CN VII: Facial muscles symmetric and strong. Mild left ptosis, not worse with sustained up gaze. CN VIII: Hears finger rub well bilaterally. CN IX: No hypophonia. CN X: Palate elevates symmetrically. CN XI: Full strength shoulder shrug bilaterally. CN XII: Tongue protrusion full and midline. No atrophy or fasciculations. No significant dysarthria Motor: Tone is normal. 5/5 in bilateral upper and lower extremities with no fatigability seen  with shoulder abduction.  Reflexes:  Right Left   Bicep 2+ 2+   Tricep 2+ 2+   BrRad 2+ 2+   Knee 1+ 1+   Ankle 1+ 1+    Sensation: Intact to pinprick in all extremities Coordination: Intact finger-to- nose-finger bilaterally. Gait: Walks with a walker. Slow with stooped posture. Antalgic gait today with more pain in neck and back.  Labs and Imaging review: New results: 10/08/22: CBC and CMP wnl   Previously reviewed results: 08/27/22: Normal or unremarkable: CBC, CMP, TSH 08/15/22: Normal ESR  and CRP   AChR binding ab: positive at 28.10   CT chest (05/15/22): FINDINGS: Cardiovascular: Moderate aortic atherosclerosis. No aneurysm. Coronary vascular calcification. Normal cardiac size. No pericardial effusion   Mediastinum/Nodes: Midline trachea. No thyroid mass. Subcarinal node measuring 12 mm. Esophagus is normal.   Lungs/Pleura: Mild bronchiectasis in the right middle lobe and bilateral lower lobes with scarring. No acute airspace disease, pleural effusion or pneumothorax.   Upper Abdomen: No acute abnormality.   Musculoskeletal: No chest wall abnormality. No acute or significant osseous findings.   IMPRESSION: 1. Negative for mediastinal mass lesion. 2. Mild bronchiectasis in the right middle and bilateral lower lobes with pulmonary scarring  Assessment/Plan:  This is Jennifer Schmitt, a 88 y.o. female with AChR ab positive generalized myasthenia gravis (symptom onset 12/2021 with diagnosis 04/28/2022). CT chest on 05/13/22 with no mediastinal mass.   Plan: -Continue Cellcept 500 mg BID -Decrease prednisone to 7.5 mg daily -Patient instructed to increase back to 10 mg if MG symptoms worsen and call the office. -Mestinon 60 mg as needed -Continue staying active -Discussed MG crisis, what to watch for, and when to go to nearest ED including difficulty breathing or swallowing -Recommended patient not lean on elbows -Physical therapy for neck and back pain -Will recheck CBC and CMP at next visit  Return to clinic in 3 months  Total time spent reviewing records, interview, history/exam, documentation, and coordination of care on day of encounter:  30 min  Kai Levins, MD

## 2023-02-12 ENCOUNTER — Ambulatory Visit: Payer: Medicare PPO | Admitting: Neurology

## 2023-02-12 ENCOUNTER — Encounter: Payer: Self-pay | Admitting: Neurology

## 2023-02-12 VITALS — BP 177/76 | HR 61 | Ht 65.0 in | Wt 155.0 lb

## 2023-02-12 DIAGNOSIS — G8929 Other chronic pain: Secondary | ICD-10-CM | POA: Diagnosis not present

## 2023-02-12 DIAGNOSIS — M545 Low back pain, unspecified: Secondary | ICD-10-CM

## 2023-02-12 DIAGNOSIS — M542 Cervicalgia: Secondary | ICD-10-CM | POA: Diagnosis not present

## 2023-02-12 DIAGNOSIS — G7000 Myasthenia gravis without (acute) exacerbation: Secondary | ICD-10-CM | POA: Diagnosis not present

## 2023-02-12 DIAGNOSIS — H02403 Unspecified ptosis of bilateral eyelids: Secondary | ICD-10-CM

## 2023-02-12 DIAGNOSIS — R269 Unspecified abnormalities of gait and mobility: Secondary | ICD-10-CM

## 2023-02-12 MED ORDER — PREDNISONE 2.5 MG PO TABS: 7.5000 mg | ORAL_TABLET | Freq: Every day | ORAL | 3 refills | Status: DC

## 2023-02-12 NOTE — Patient Instructions (Addendum)
Medication plan: -Decrease prednisone to 7.5 mg daily. I have sent a new prescription to your pharmacy with 2.5 mg tablets. You will take 3 tablets daily (can take all at once) -Continue Cellcept 500 mg twice daily -You are not taking mestinon currently, but if you have symptoms, it is available to you as needed.  I am ordering physical therapy for your neck and back pain.  Try not to lean on your elbows to prevent numbness in your hands.  Return to clinic in about 3 months or sooner if needed.  The physicians and staff at [facility] are committed to providing excellent care. You may receive a survey requesting feedback about your experience at our office. We strive to receive "very good" responses to the survey questions. If you feel that your experience would prevent you from giving the office a "very good " response, please contact our office to try to remedy the situation. We may be reached at [phone]. Thank you for taking the time out of your busy day to complete the survey.  Kai Levins, MD [facility]  Preventing Falls at [facility] are common, often dreaded events in the lives of older people. Aside from the obvious injuries and even death that may result, fall can cause wide-ranging consequences including loss of independence, mental decline, decreased activity and mobility. Younger people are also at risk of falling, especially those with chronic illnesses and fatigue.  Ways to reduce risk for falling Examine diet and medications. Warm foods and alcohol dilate blood vessels, which can lead to dizziness when standing. Sleep aids, antidepressants and pain medications can also increase the likelihood of a fall.  Get a vision exam. Poor vision, cataracts and glaucoma increase the chances of falling.  Check foot gear. Shoes should fit snugly and have a sturdy, nonskid sole and a broad, low heel  Participate in a physician-approved exercise program to build and  maintain muscle strength and improve balance and coordination. Programs that use ankle weights or stretch bands are excellent for muscle-strengthening. Water aerobics programs and low-impact Tai Chi programs have also been shown to improve balance and coordination.  Increase vitamin D intake. Vitamin D improves muscle strength and increases the amount of calcium the body is able to absorb and deposit in bones.  How to prevent falls from common hazards Floors - Remove all loose wires, cords, and throw rugs. Minimize clutter. Make sure rugs are anchored and smooth. Keep furniture in its usual place.  Chairs -- Use chairs with straight backs, armrests and firm seats. Add firm cushions to existing pieces to add height.  Bathroom - Install grab bars and non-skid tape in the tub or shower. Use a bathtub transfer bench or a shower chair with a back support Use an elevated toilet seat and/or safety rails to assist standing from a low surface. Do not use towel racks or bathroom tissue holders to help you stand.  Lighting - Make sure halls, stairways, and entrances are well-lit. Install a night light in your bathroom or hallway. Make sure there is a light switch at the top and bottom of the staircase. Turn lights on if you get up in the middle of the night. Make sure lamps or light switches are within reach of the bed if you have to get up during the night.  Kitchen - Install non-skid rubber mats near the sink and stove. Clean spills immediately. Store frequently used utensils, pots, pans between waist and eye level. This helps prevent  reaching and bending. Sit when getting things out of lower cupboards.  Living room/ Bedrooms - Place furniture with wide spaces in between, giving enough room to move around. Establish a route through the living room that gives you something to hold onto as you walk.  Stairs - Make sure treads, rails, and rugs are secure. Install a rail on both sides of the stairs. If stairs  are a threat, it might be helpful to arrange most of your activities on the lower level to reduce the number of times you must climb the stairs.  Entrances and doorways - Install metal handles on the walls adjacent to the doorknobs of all doors to make it more secure as you travel through the doorway.  Tips for maintaining balance Keep at least one hand free at all times. Try using a backpack or fanny pack to hold things rather than carrying them in your hands. Never carry objects in both hands when walking as this interferes with keeping your balance.  Attempt to swing both arms from front to back while walking. This might require a conscious effort if Parkinson's disease has diminished your movement. It will, however, help you to maintain balance and posture, and reduce fatigue.  Consciously lift your feet off of the ground when walking. Shuffling and dragging of the feet is a common culprit in losing your balance.  When trying to navigate turns, use a "U" technique of facing forward and making a wide turn, rather than pivoting sharply.  Try to stand with your feet shoulder-length apart. When your feet are close together for any length of time, you increase your risk of losing your balance and falling.  Do one thing at a time. Don't try to walk and accomplish another task, such as reading or looking around. The decrease in your automatic reflexes complicates motor function, so the less distraction, the better.  Do not wear rubber or gripping soled shoes, they might "catch" on the floor and cause tripping.  Move slowly when changing positions. Use deliberate, concentrated movements and, if needed, use a grab bar or walking aid. Count 15 seconds between each movement. For example, when rising from a seated position, wait 15 seconds after standing to begin walking.  If balance is a continuous problem, you might want to consider a walking aid such as a cane, walking stick, or walker. Once you've  mastered walking with help, you might be ready to try it on your own again.

## 2023-02-13 DIAGNOSIS — H11123 Conjunctival concretions, bilateral: Secondary | ICD-10-CM | POA: Diagnosis not present

## 2023-02-13 DIAGNOSIS — M3501 Sicca syndrome with keratoconjunctivitis: Secondary | ICD-10-CM | POA: Diagnosis not present

## 2023-02-23 DIAGNOSIS — M549 Dorsalgia, unspecified: Secondary | ICD-10-CM | POA: Diagnosis not present

## 2023-02-23 DIAGNOSIS — G7001 Myasthenia gravis with (acute) exacerbation: Secondary | ICD-10-CM | POA: Diagnosis not present

## 2023-02-23 DIAGNOSIS — M542 Cervicalgia: Secondary | ICD-10-CM | POA: Diagnosis not present

## 2023-02-25 ENCOUNTER — Ambulatory Visit: Payer: Medicare PPO | Admitting: Cardiology

## 2023-02-26 DIAGNOSIS — M542 Cervicalgia: Secondary | ICD-10-CM | POA: Diagnosis not present

## 2023-02-26 DIAGNOSIS — M549 Dorsalgia, unspecified: Secondary | ICD-10-CM | POA: Diagnosis not present

## 2023-02-26 DIAGNOSIS — G7001 Myasthenia gravis with (acute) exacerbation: Secondary | ICD-10-CM | POA: Diagnosis not present

## 2023-03-03 DIAGNOSIS — G7001 Myasthenia gravis with (acute) exacerbation: Secondary | ICD-10-CM | POA: Diagnosis not present

## 2023-03-03 DIAGNOSIS — M549 Dorsalgia, unspecified: Secondary | ICD-10-CM | POA: Diagnosis not present

## 2023-03-03 DIAGNOSIS — M542 Cervicalgia: Secondary | ICD-10-CM | POA: Diagnosis not present

## 2023-03-05 DIAGNOSIS — M549 Dorsalgia, unspecified: Secondary | ICD-10-CM | POA: Diagnosis not present

## 2023-03-05 DIAGNOSIS — G7001 Myasthenia gravis with (acute) exacerbation: Secondary | ICD-10-CM | POA: Diagnosis not present

## 2023-03-05 DIAGNOSIS — M542 Cervicalgia: Secondary | ICD-10-CM | POA: Diagnosis not present

## 2023-03-08 NOTE — Progress Notes (Unsigned)
  Electrophysiology Office Follow up Visit Note:    Date:  03/11/2023   ID:  Jennifer Schmitt, DOB 10-Feb-1935, MRN [id number]  PCP:  Venia Carbon, MD  [facility] Cardiologist:  None  [facility] Electrophysiologist:  Vickie Epley, MD    Interval History:    Jennifer Schmitt is a 88 y.o. female who presents for a follow up visit.   Last seen 08/27/2022 for persistent AF. She is on amiodarone. She has a history of myasthenia gravis. She tells me that her heart rhythm has been okay.  Her myasthenia is because significant trouble this past year.  There is also increased rest at home with her husband who has dementia.  She is tolerating her amiodarone and Xarelto without bleeding issues.      Past medical, surgical, social and family history were reviewed.  ROS:   Please see the history of present illness.    All other systems reviewed and are negative.  EKGs/Labs/Other Studies Reviewed:    The following studies were reviewed today:       Physical Exam:    VS:  BP 110/60   Pulse (!) 56   Ht 5\' 5"  (1.651 m)   Wt 154 lb (69.9 kg)   SpO2 99%   BMI 25.63 kg/m     Wt Readings from Last 3 Encounters:  03/11/23 154 lb (69.9 kg)  02/12/23 155 lb (70.3 kg)  12/03/22 155 lb (70.3 kg)     GEN:  Well nourished, well developed in no acute distress CARDIAC: RRR, no murmurs, rubs, gallops RESPIRATORY:  Clear to auscultation without rales, wheezing or rhonchi       ASSESSMENT:    No diagnosis found. PLAN:    In order of problems listed above:  #Persistent AF #High risk med - amiodarone Last liver/thyroid labs look ok in 09/2022. Repeat CMP, TSH and FT4 today.  #Hypertension At goal today.  Recommend checking blood pressures 1-2 times per week at home and recording the values.  Recommend bringing these recordings to the primary care physician.   #Myasthenia gravis Prevents safe ablation.    Follow up in 6 months with APP. CMP, TSH and FT4 at that  visit.  Signed, Lars Mage, MD, [facility], [facility] 03/11/2023 11:24 AM    Electrophysiology [facility]

## 2023-03-09 ENCOUNTER — Other Ambulatory Visit: Payer: Self-pay | Admitting: Neurology

## 2023-03-11 ENCOUNTER — Ambulatory Visit: Payer: Medicare PPO | Attending: Cardiology | Admitting: Cardiology

## 2023-03-11 ENCOUNTER — Encounter: Payer: Self-pay | Admitting: Cardiology

## 2023-03-11 VITALS — BP 110/60 | HR 56 | Ht 65.0 in | Wt 154.0 lb

## 2023-03-11 DIAGNOSIS — Z79899 Other long term (current) drug therapy: Secondary | ICD-10-CM | POA: Diagnosis not present

## 2023-03-11 DIAGNOSIS — I4819 Other persistent atrial fibrillation: Secondary | ICD-10-CM

## 2023-03-11 DIAGNOSIS — I10 Essential (primary) hypertension: Secondary | ICD-10-CM

## 2023-03-11 NOTE — Patient Instructions (Signed)
Medication Instructions:  Your physician recommends that you continue on your current medications as directed. Please refer to the Current Medication list given to you today.  *If you need a refill on your cardiac medications before your next appointment, please call your pharmacy*  Lab Work: TODAY: CMET, TSH, T4 - You will get your lab work at [facility]) hospital.  Your lab work will be done at the [place] next to the [employer].  These are walk in labs- you will not need an appointment and you do not need to be fasting.    If you have labs (blood work) drawn today and your tests are completely normal, you will receive your results only by: [place] (if you have MyChart) OR A paper copy in the mail If you have any lab test that is abnormal or we need to change your treatment, we will call you to review the results.  Follow-Up: At [facility], you and your health needs are our priority.  As part of our continuing mission to provide you with exceptional heart care, we have created designated Provider Care Teams.  These Care Teams include your primary Cardiologist (physician) and Advanced Practice Providers (APPs -  Physician Assistants and Nurse Practitioners) who all work together to provide you with the care you need, when you need it.  Your next appointment:   6 month(s)  Provider:   You will see one of the following Advanced Practice Providers on your designated Care Team:   Tommye Standard, [place]" [place], [place], NP

## 2023-03-13 ENCOUNTER — Other Ambulatory Visit
Admission: RE | Admit: 2023-03-13 | Discharge: 2023-03-13 | Disposition: A | Discharge status: Home / Self Care | Payer: Medicare PPO | Attending: Cardiology | Admitting: Cardiology

## 2023-03-13 DIAGNOSIS — I10 Essential (primary) hypertension: Secondary | ICD-10-CM

## 2023-03-13 DIAGNOSIS — Z79899 Other long term (current) drug therapy: Secondary | ICD-10-CM

## 2023-03-13 DIAGNOSIS — I4819 Other persistent atrial fibrillation: Secondary | ICD-10-CM | POA: Diagnosis not present

## 2023-03-13 LAB — COMPREHENSIVE METABOLIC PANEL
ALT: 24 U/L (ref 0–44)
AST: 21 U/L (ref 15–41)
Albumin: 4 g/dL (ref 3.5–5.0)
Alkaline Phosphatase: 47 U/L (ref 38–126)
Anion gap: 8 (ref 5–15)
BUN: 19 mg/dL (ref 8–23)
CO2: 29 mmol/L (ref 22–32)
Calcium: 8.8 mg/dL — ABNORMAL LOW (ref 8.9–10.3)
Chloride: 102 mmol/L (ref 98–111)
Creatinine, Ser: 0.78 mg/dL (ref 0.44–1.00)
GFR, Estimated: >60 mL/min (ref >60)
Glucose, Bld: 68 mg/dL — ABNORMAL LOW (ref 70–99)
Potassium: 3.9 mmol/L (ref 3.5–5.1)
Sodium: 139 mmol/L (ref 135–145)
Total Bilirubin: 0.5 mg/dL (ref 0.3–1.2)
Total Protein: 6.9 g/dL (ref 6.5–8.1)

## 2023-03-13 LAB — TSH: TSH: 1.963 u[IU]/mL (ref 0.350–4.500)

## 2023-03-13 LAB — T4, FREE: Free T4: 0.93 ng/dL (ref 0.61–1.12)

## 2023-03-17 DIAGNOSIS — M542 Cervicalgia: Secondary | ICD-10-CM | POA: Diagnosis not present

## 2023-03-17 DIAGNOSIS — M549 Dorsalgia, unspecified: Secondary | ICD-10-CM | POA: Diagnosis not present

## 2023-03-17 DIAGNOSIS — G7001 Myasthenia gravis with (acute) exacerbation: Secondary | ICD-10-CM | POA: Diagnosis not present

## 2023-03-24 DIAGNOSIS — M542 Cervicalgia: Secondary | ICD-10-CM | POA: Diagnosis not present

## 2023-03-24 DIAGNOSIS — M549 Dorsalgia, unspecified: Secondary | ICD-10-CM | POA: Diagnosis not present

## 2023-03-24 DIAGNOSIS — G7001 Myasthenia gravis with (acute) exacerbation: Secondary | ICD-10-CM | POA: Diagnosis not present

## 2023-03-26 DIAGNOSIS — M542 Cervicalgia: Secondary | ICD-10-CM | POA: Diagnosis not present

## 2023-03-26 DIAGNOSIS — M549 Dorsalgia, unspecified: Secondary | ICD-10-CM | POA: Diagnosis not present

## 2023-03-26 DIAGNOSIS — G7001 Myasthenia gravis with (acute) exacerbation: Secondary | ICD-10-CM | POA: Diagnosis not present

## 2023-04-02 DIAGNOSIS — M542 Cervicalgia: Secondary | ICD-10-CM | POA: Diagnosis not present

## 2023-04-02 DIAGNOSIS — M549 Dorsalgia, unspecified: Secondary | ICD-10-CM | POA: Diagnosis not present

## 2023-04-02 DIAGNOSIS — G7001 Myasthenia gravis with (acute) exacerbation: Secondary | ICD-10-CM | POA: Diagnosis not present

## 2023-04-24 NOTE — Progress Notes (Signed)
NEUROLOGY FOLLOW UP OFFICE NOTE  Jennifer Schmitt [id number]  Subjective:  Jennifer Schmitt is a 88 y.o. year old female with a history of with a history of seropositive myasthenia gravis, polymyalgia rheumatica, HTN, HLD, pAfib, and OA who we last saw on 02/12/23.  To briefly review: Patient was previously seen in this office by Dr. Everlena Cooper (last seen on 06/05/22). Per initial clinic note on 05/08/22: Since January-February 2023, she has had progressive weakness.  She began having trouble rolling over in bed.  She was unable to get up and off her bed so she started sleeping on the couch..  Noted weakness in the left leg.  Seems to be dragging her right leg.  When standing, always with sensation that legs will give out. She has had falls and unable to stand up, requiring to call EMS.  Notes particular weakness in the proximal arms and trouble keeping her head up.  Started relying on a cane more frequently and then wheelchair.  She also endorsed worsening musculoskeletal pain with pain from the neck down her spine to her sacrum.  She does have degenerative spine disease and polymyalgia rheumatica.  MRI of brain with and without contrast on 04/27/2022 personally reviewed showed moderate chronic small vessel ischemic changes and known 1 x 2 mm vestibular schwannoma within the right internal auditory canal, stable compared to prior imaging in 2017.  She started noticing that her left eyelid was drooping.  She went to the ophthalmologist who suspected temporal arteritis given her history of PMR.  No vision loss.  Sed rate and CRP on 04/23/2022 were 101 and 10.1 respectively and was started on prednisone 60mg  daily with some improvement.  Based on strong likelihood of giant cell arteritis with elevated inflammatory markers and having PMR, temporal artery biopsy was not pursued.  She was started on prednisone 60mg  daily but discontinued after 3 days due to side effects.  She also has had increased urinary urgency and  sometimes fecal incontinence.  Due to sacral pain following a fall, she had X-ray of lumbar spine and MRI of sacrum which were negative for occult fracture.  AChR Binding Ab on 04/28/2022 was elevated at 28.10.  PCP started her on pyridostigmine 30mg  three times daily.  She notes improvement in strength in the legs.  However, she still has a head drop.  Denies double vision, trouble swallowing, trouble chewing, and trouble taking a breath.   At 05/08/22 visit, Dr. Everlena Cooper recommended prednisone 20 mg daily, mestinon 60 mg TID (7 am, 1 pm, 7 pm). CT chest on 05/15/22 was negative for mediastinal mass (no evidence of thymoma). Patient discontinued prednisone shortly after starting as it made her jittery and not able to sleep. At follow up with Dr. Everlena Cooper on 06/05/22, patient was not feeling well, but was walking a little better. She still had head drop. Hospital admission for a course of IVIg was suggested to prevent MG crisis, but patient declined as she needed to be home to take care of her husband with Alzheimer's disease. Cellcept 500 mg BID was started with IVIg every 28 days until Cellcept was therapeutic. She was also continued on mestinon. CBC and CMP were set up to be check weekly for 4 weeks, followed by monthly for 3 months. Patient called the next day though because she did not want IVIg. She decided to restart prednisone instead.   She felt much improved on 09/05/22. She is about 75% better than prior visit.   Patient lives  at a retirement with healthcare and PT/OT. She has many resources there. Patient went to PT and OT. She is currently done with both. Husband is currently in a memory care unit twice a week.   Current medications: Cellcept 500 mg BID, Prednisone 20 mg once in the morning, Mestinon 60 mg TID (7:30-8am, 12pm, 8-9pm).   Side effects: Cellcept is difficult to take on a empty stomach. She gets a little nausea but this is moderately improved. She has hot flashes or temperature fluctuations  that are episodic. She is otherwise not having significant side effects.   MG symptoms on 09/05/22: Ptosis: Some drooping on left, pretty constant; improved compared to prior, worse when tired, not bothersome Double vision: Never Speech: Occasionally slurred speech, especially when tired; no one else commenting on it per patient Chewing: No problems currently Swallowing: Occasionally, episodic difficulty requiring a double swallow; no coughing Breathing: No problems currently Arm strength: No problems; Neck is still weak, but much improved. Cannot sit up straight for long periods of time Leg strength: Walks with a walker. She feels like her legs are strong. She has a walker due to not being able to stand up right.   At her visit on 09/05/22, we continued prednisone 20 mg daily, Cellcept 500 mg BID, but started tapering her mestinon (stopped on 09/11/22). I called the patient on 09/12/22 at which time she noted no change in her symptoms off mestinon. As a result, we agreed to begin tapering her prednisone. She decreased to 15 mg on 09/13/22.  At 02/12/23 visit, patient was doing well with minimal symptoms. She was on prednisone 10 mg daily Cellcept 500 mg BID and not taking mestinon. Prednisone was decreased to 7.5 mg daily on 02/12/23 (other medications kept the same). She mentioned neck and back pain and numbness in the 5th digit of bilateral hands at this visit.  Most recent Assessment and Plan (02/12/23): This is Jennifer Schmitt, a 88 y.o. female with AChR ab positive generalized myasthenia gravis (symptom onset 12/2021 with diagnosis 04/28/2022). CT chest on 05/13/22 with no mediastinal mass.    Plan: -Continue Cellcept 500 mg BID -Decrease prednisone to 7.5 mg daily -Patient instructed to increase back to 10 mg if MG symptoms worsen and call the office. -Mestinon 60 mg as needed -Continue staying active -Discussed MG crisis, what to watch for, and when to go to nearest ED including difficulty  breathing or swallowing -Recommended patient not lean on elbows -Physical therapy for neck and back pain -Will recheck CBC and CMP at next visit  Since their last visit: Patient is doing very well. She does endorse some low mood due to situation with husband's health (dementia). She mentions some hopelessness regarding this. She is planning to talk to PCP about this.  Current MG symptoms: Ptosis: Occasionally her right eyelid feels heavy Double vision: None Speech: Mild slurring when tired, but not much Chewing: No problems Swallowing: sometimes has problems with pills, but no choking or coughing when eating or drinking Breathing: No problems Arm strength: Has numbness in 4th and 5th digits bilaterally, but otherwise no issues Leg strength: No difficulties, occasionally numbness in feet  Current medications: Cellcept 500 mg BID Prednisone 5 mg daily (taking 2, 2.5 mg pills per patient, though I had her on 7.5 mg) Not taking mestinon (has it as needed)  Patient has done PT for neck and back pain. She continues to do home exercises. She feels this helped with some discomfort and imbalance.  MEDICATIONS:  Outpatient Encounter Medications as of 05/01/2023  Medication Sig   amiodarone (PACERONE) 200 MG tablet Take 1 tablet (200 mg total) by mouth daily.   BD SYRINGE SLIP TIP 25G X 5/8" 1 ML MISC    calcium carbonate (CALCIUM 600) 600 MG TABS tablet Take 1 tablet (600 mg total) by mouth daily.   Cholecalciferol (VITAMIN D) 2000 units CAPS Take 2,000 Units by mouth daily.   cyanocobalamin (VITAMIN B12) 1000 MCG/ML injection INJECT 1 ML MONTHLY AS DIRECTED   diltiazem (CARDIZEM) 30 MG tablet Take 1 tablet (30 mg total) by mouth 3 (three) times daily as needed (atrial fibrillation lasting more than 2 hours).   dimenhyDRINATE (DRAMAMINE PO) Take by mouth.   fluticasone (FLONASE) 50 MCG/ACT nasal spray Place 1 spray into both nostrils daily.   folic acid (FOLVITE) 1 MG tablet TAKE 1 TABLET  BY MOUTH DAILY   guaiFENesin-codeine (ROBITUSSIN AC) 100-10 MG/5ML syrup Take 5 mLs by mouth 3 (three) times daily as needed for cough.   hydrocortisone 2.5 % cream Apply topically 3 (three) times daily as needed.   lansoprazole (PREVACID) 15 MG capsule TAKE ONE CAPSULE BY MOUTH DAILY AT 12 (NOON)   LORazepam (ATIVAN) 0.5 MG tablet TAKE ONE HALF TO 1 TABLET BY MOUTH TWICEDAILY AS NEEDED FOR ANXIETY   metoprolol succinate (TOPROL-XL) 25 MG 24 hr tablet TAKE ONE TABLET BY MOUTH DAILY   MIEBO 1.338 GM/ML SOLN Apply 1 drop to eye daily.   Multiple Vitamins-Minerals (PRESERVISION AREDS 2 PO) Take by mouth daily.   mycophenolate (CELLCEPT) 500 MG tablet Take 1 tablet (500 mg total) by mouth 2 (two) times daily.   predniSONE (DELTASONE) 2.5 MG tablet Take 3 tablets (7.5 mg total) by mouth daily with breakfast.   propranolol (INDERAL) 10 MG tablet Take 1 tablet (10 mg total) by mouth 3 (three) times daily. daily as needed (for breakthrough atrial fibrillation)   pyridostigmine (MESTINON) 60 MG tablet Take 1 tablet at 7 AM, 1 tablet at 1 PM and 1 tablet at 7 PM   rosuvastatin (CRESTOR) 5 MG tablet Take 1 tablet (5 mg total) by mouth daily.   Trolamine Salicylate (ASPERCREME EX) Apply topically as needed.   valsartan (DIOVAN) 80 MG tablet Take 0.5 tablets (40 mg total) by mouth daily.   XARELTO 20 MG TABS tablet TAKE 1 TABLET BY MOUTH ONCE A DAY WITH THE EVENING MEAL.   acetaminophen (TYLENOL) 500 MG tablet Take 1,000 mg by mouth as needed. (Patient not taking: Reported on 05/01/2023)   [DISCONTINUED] amiodarone (PACERONE) 200 MG tablet Take 200 mg by mouth daily.   [DISCONTINUED] metoprolol succinate (TOPROL XL) 25 MG 24 hr tablet Take 1 tablet (25 mg total) by mouth daily.   No facility-administered encounter medications on file as of 05/01/2023.    PAST MEDICAL HISTORY: Past Medical History:  Diagnosis Date   Adenomatous colon polyp    Allergic rhinitis due to pollen    Anemia    Anxiety     Bronchiectasis (HCC)    Complication of anesthesia    Degenerative disc disease    cervical and lumbar   Dysrhythmia    GERD (gastroesophageal reflux disease)    Hearing loss in right ear    Heart murmur    HOH (hard of hearing)    AIDS   Hyperlipidemia    Hypertension    Osteoarthritis, multiple sites    PAF (paroxysmal atrial fibrillation) (HCC)    a. 01/2013 Echo [facility]): EF 60-65%,  no rwma, mildly dil LA/RA, mild AI/TR, trace MR, nl RV size/fxn, mild PAH; b. CHA2DS2VASc = 4-->Chronic Xarelto.   PMR (polymyalgia rheumatica) (HCC)    PONV (postoperative nausea and vomiting)    Rheumatic fever    Sleep apnea    NO CPAP   Urge incontinence    Vertigo    Vertigo     PAST SURGICAL HISTORY: Past Surgical History:  Procedure Laterality Date   ABDOMINAL HYSTERECTOMY     ANKLE FRACTURE SURGERY Left 1996   BACK SURGERY     BREAST BIOPSY Left 2010   negative   CATARACT EXTRACTION W/PHACO Left 10/05/2018   Procedure: CATARACT EXTRACTION PHACO AND INTRAOCULAR LENS PLACEMENT (IOC);  Surgeon: Galen Manila, MD;  Location: [facility] ORS;  Service: Ophthalmology;  Laterality: Left;  [place] 00:40 CDE 6.92 fluid pack lot # [id number]      CATARACT EXTRACTION W/PHACO Right 10/26/2018   Procedure: CATARACT EXTRACTION PHACO AND INTRAOCULAR LENS PLACEMENT (IOC);  Surgeon: Galen Manila, MD;  Location: [facility] ORS;  Service: Ophthalmology;  Laterality: Right;  [place]  00:47 CDE 7.11 Fluid pack lot # [id number]   DEXA  11/09/06   normal   FRACTURE SURGERY     Meningitis  1963   SPINE SURGERY     SYNOVECTOMY Right 1973   elbow   TONSILLECTOMY AND ADENOIDECTOMY      ALLERGIES: Allergies  Allergen Reactions   Actonel [Risedronate Sodium] Nausea And Vomiting   Boniva [Ibandronic Acid] Nausea And Vomiting   Diclofenac Other (See Comments)    Voltaren Gel aggravated her sinus passages   Fosamax [Alendronate Sodium] Nausea And Vomiting   Lipitor [Atorvastatin] Other (See Comments)     Muscle aches. Tolerates Crestor.   Miacalcin [Calcitonin ([place])] Other (See Comments)    Sneezing    Myrbetriq [Mirabegron] Other (See Comments)    Gastritis   Talwin [Pentazocine] Other (See Comments)    Hallucinations    Latex Rash    Sneezing, watery eyes.   Niaspan [Niacin Er] Swelling and Rash    FAMILY HISTORY: Family History  Problem Relation Age of Onset   Schizophrenia Mother        paranoid   Heart disease Mother        from psych meds   Arthritis Mother    Rheum arthritis Mother    Cancer Father        prostate   Arthritis Father    Diabetes Father    Breast cancer Paternal Aunt 73   Ovarian cancer Maternal Grandmother    Uterine cancer Maternal Grandmother    Colon cancer Maternal Grandmother    Healthy Son     SOCIAL HISTORY: Social History   Tobacco Use   Smoking status: Never    Passive exposure: Never   Smokeless tobacco: Never  Vaping Use   Vaping Use: Never used  Substance Use Topics   Alcohol use: Yes   Drug use: No   Social History   Social History Narrative   Retired 2007    1 son in [place] area   Spends part time in [place]      Has living will   Husband then son is health care POA--will need to be son with husband's decline   Not sure about DNR---will keep open resuscitation for now    No tube feedings if cognitively unaware   Right handed                  Objective:  Vital  Signs:  BP (!) 150/65   Pulse 60   Ht 5\' 5"  (1.651 m)   Wt 157 lb (71.2 kg)   SpO2 98%   BMI 26.13 kg/m   General: General appearance: Awake and alert. No distress. Cooperative with exam.  HEENT: Atraumatic. Anicteric. Lungs: Non-labored breathing on room air  Psych: Affect appropriate.  Neurological: Mental Status: Alert. Speech fluent. No pseudobulbar affect Cranial Nerves: CNII: No RAPD. Visual fields intact. CNIII, IV, VI: PERRL. No nystagmus. EOMI. CN V: Facial sensation intact bilaterally to fine touch. CN VII: Facial muscles  symmetric and strong. No ptosis at rest or after sustained upgaze. CN VIII: Hears finger rub well bilaterally. CN IX: No hypophonia. CN X: Palate elevates symmetrically. CN XI: Full strength shoulder shrug bilaterally. CN XII: Tongue protrusion full and midline. No atrophy or fasciculations. No significant dysarthria Motor: Tone is normal. No atrophy. Strength is 5/5 in bilateral upper and lower extremities. Reflexes: 2+ in bilateral upper extremities, 1+ in bilateral lower extremities Sensation: Pinprick: Intact in all extremities Coordination: Intact finger-to- nose-finger bilaterally Gait: Walks with walker. Stooped posture. Slow, unsteady. Narrow-based   Labs and Imaging review: New results: 03/13/23: CMP: unremarkable TSH: 1.963  Previously reviewed results: 10/08/22: CBC and CMP wnl   08/27/22: Normal or unremarkable: CBC, CMP, TSH  08/15/22: Normal ESR and CRP   AChR binding ab: positive at 28.10   CT chest (05/15/22): FINDINGS: Cardiovascular: Moderate aortic atherosclerosis. No aneurysm. Coronary vascular calcification. Normal cardiac size. No pericardial effusion   Mediastinum/Nodes: Midline trachea. No thyroid mass. Subcarinal node measuring 12 mm. Esophagus is normal.   Lungs/Pleura: Mild bronchiectasis in the right middle lobe and bilateral lower lobes with scarring. No acute airspace disease, pleural effusion or pneumothorax.   Upper Abdomen: No acute abnormality.   Musculoskeletal: No chest wall abnormality. No acute or significant osseous findings.   IMPRESSION: 1. Negative for mediastinal mass lesion. 2. Mild bronchiectasis in the right middle and bilateral lower lobes with pulmonary scarring  Assessment/Plan:  This is Jennifer Schmitt, a 88 y.o. female with AChR ab positive generalized myasthenia gravis (symptom onset 12/2021 with diagnosis 04/28/2022). CT chest on 05/13/22 with no mediastinal mass.    Plan: -Blood work: CBC w/ diff -Continue  Cellcept 500 mg BID -Decrease prednisone to 5 mg daily -Patient instructed to increase back to 10 mg if MG symptoms worsen and call the office. -Mestinon 60 mg as needed -Continue staying active -Discussed MG crisis, what to watch for, and when to go to nearest ED including difficulty breathing or swallowing    Return to clinic in 3 months  Total time spent reviewing records, interview, history/exam, documentation, and coordination of care on day of encounter:  35 min  Jacquelyne Balint, MD

## 2023-04-29 ENCOUNTER — Other Ambulatory Visit: Payer: Self-pay | Admitting: Cardiology

## 2023-05-01 ENCOUNTER — Ambulatory Visit: Payer: Medicare PPO | Admitting: Neurology

## 2023-05-01 ENCOUNTER — Encounter: Payer: Self-pay | Admitting: Neurology

## 2023-05-01 ENCOUNTER — Other Ambulatory Visit (INDEPENDENT_AMBULATORY_CARE_PROVIDER_SITE_OTHER): Payer: Medicare PPO

## 2023-05-01 VITALS — BP 150/65 | HR 60 | Ht 65.0 in | Wt 157.0 lb

## 2023-05-01 DIAGNOSIS — M545 Low back pain, unspecified: Secondary | ICD-10-CM | POA: Diagnosis not present

## 2023-05-01 DIAGNOSIS — G7000 Myasthenia gravis without (acute) exacerbation: Secondary | ICD-10-CM | POA: Diagnosis not present

## 2023-05-01 DIAGNOSIS — M6281 Muscle weakness (generalized): Secondary | ICD-10-CM

## 2023-05-01 DIAGNOSIS — H02403 Unspecified ptosis of bilateral eyelids: Secondary | ICD-10-CM | POA: Diagnosis not present

## 2023-05-01 DIAGNOSIS — G8929 Other chronic pain: Secondary | ICD-10-CM | POA: Diagnosis not present

## 2023-05-01 DIAGNOSIS — R269 Unspecified abnormalities of gait and mobility: Secondary | ICD-10-CM

## 2023-05-01 DIAGNOSIS — M542 Cervicalgia: Secondary | ICD-10-CM

## 2023-05-01 LAB — CBC WITH DIFFERENTIAL/PLATELET
Absolute Monocytes: 346 cells/uL (ref 200–950)
Basophils Absolute: 19 cells/uL (ref 0–200)
Basophils Relative: 0.3 %
Eosinophils Absolute: 13 cells/uL — ABNORMAL LOW (ref 15–500)
Eosinophils Relative: 0.2 %
HCT: 35.9 % (ref 35.0–45.0)
Hemoglobin: 12 g/dL (ref 11.7–15.5)
Lymphs Abs: 678 cells/uL — ABNORMAL LOW (ref 850–3900)
MCH: 32 pg (ref 27.0–33.0)
MCHC: 33.4 g/dL (ref 32.0–36.0)
MCV: 95.7 fL (ref 80.0–100.0)
MPV: 10.7 fL (ref 7.5–12.5)
Monocytes Relative: 5.4 %
Neutro Abs: 5344 cells/uL (ref 1500–7800)
Neutrophils Relative %: 83.5 %
Platelets: 247 10*3/uL (ref 140–400)
RBC: 3.75 10*6/uL — ABNORMAL LOW (ref 3.80–5.10)
RDW: 13.1 % (ref 11.0–15.0)
Total Lymphocyte: 10.6 %
WBC: 6.4 10*3/uL (ref 3.8–10.8)

## 2023-05-01 IMAGING — CT CT CHEST W/ CM
1 series · 15 of 34 positions shown, 19 images · IV contrast (agent unspecified)
Comparison: None Available.

CLINICAL DATA: Myasthenia gravis

EXAM:
CT CHEST WITH CONTRAST
TECHNIQUE: Multidetector CT imaging of the chest was performed during
intravenous contrast administration.

[Series 2: chest with 2mm st · axial · 0.64mm/px · z∈[-244,+22]mm · 15 of 157 slices shown, 19 images]
[im 12/157  mediastinal]
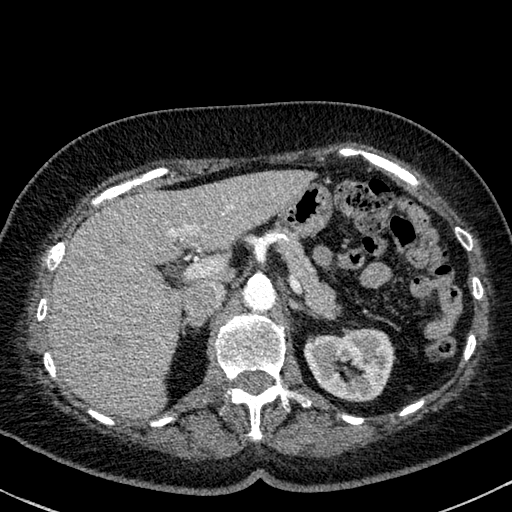
[im 12/157  lung]
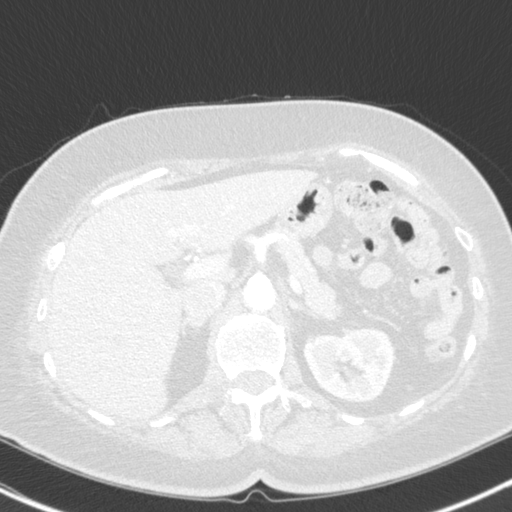
[im 24/157  lung]
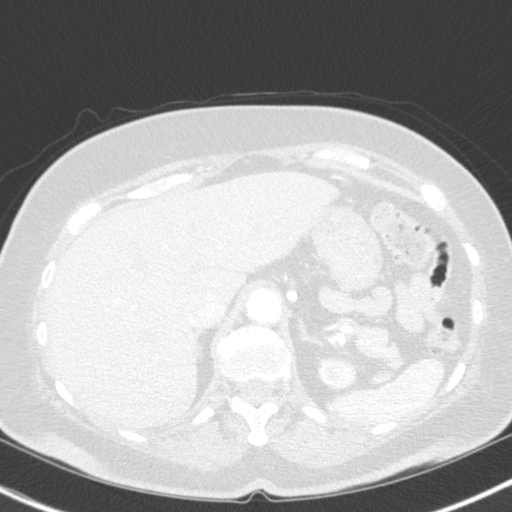
[im 32/157  lung]
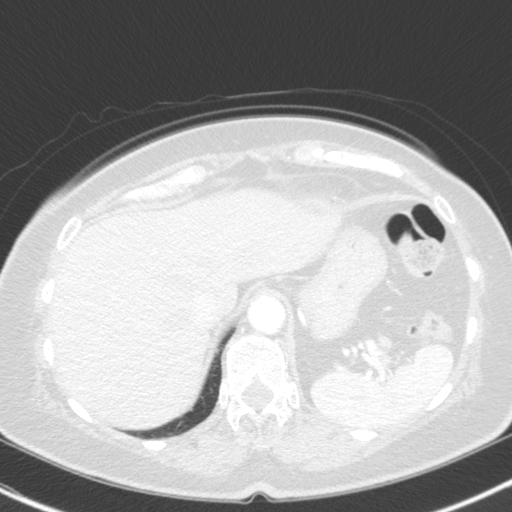
[im 41/157  lung]
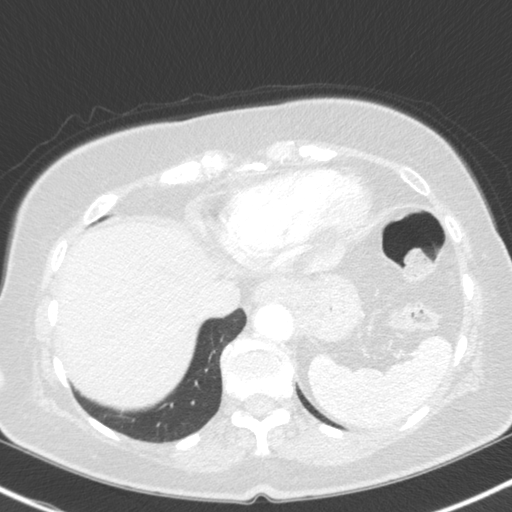
[im 53/157  mediastinal]
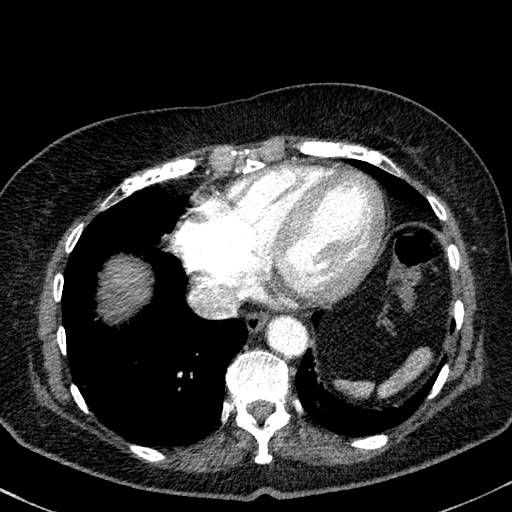
[im 53/157  lung]
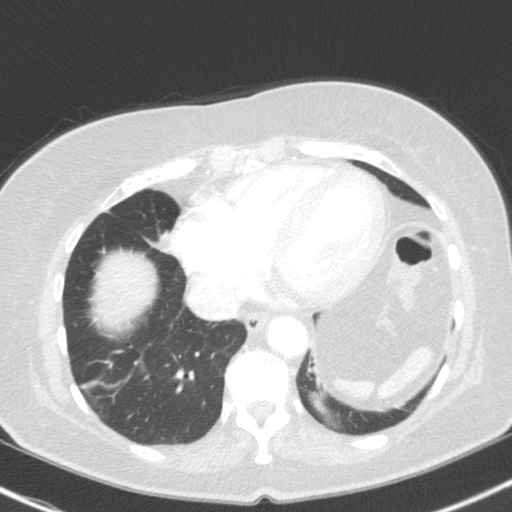
[im 63/157  lung]
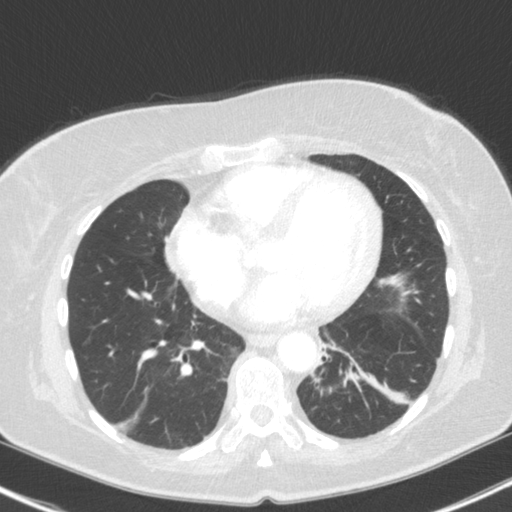
[im 70/157  lung]
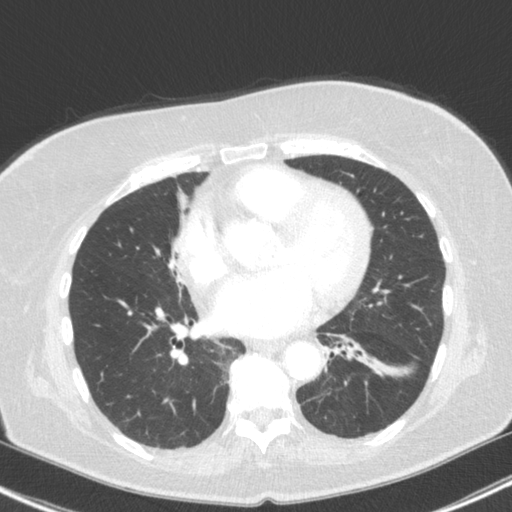
[im 81/157  lung]
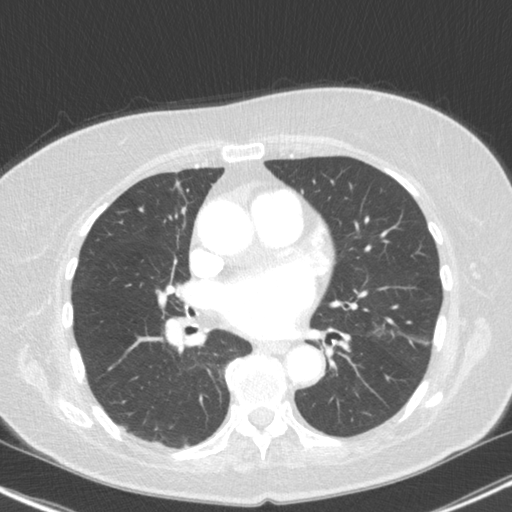
[im 87/157  mediastinal]
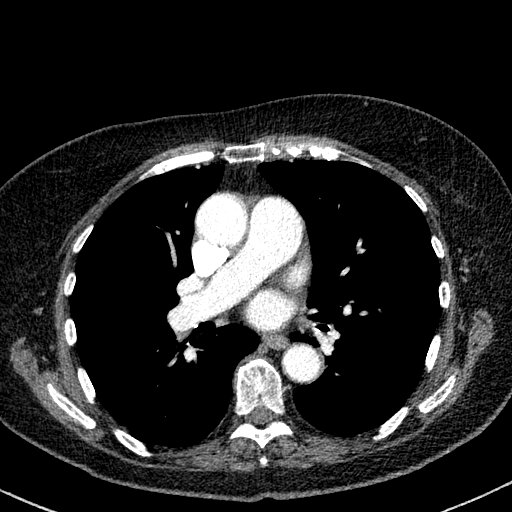
[im 87/157  lung]
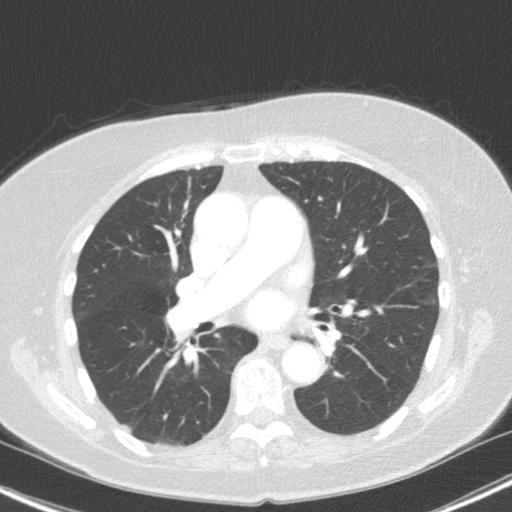
[im 94/157  lung]
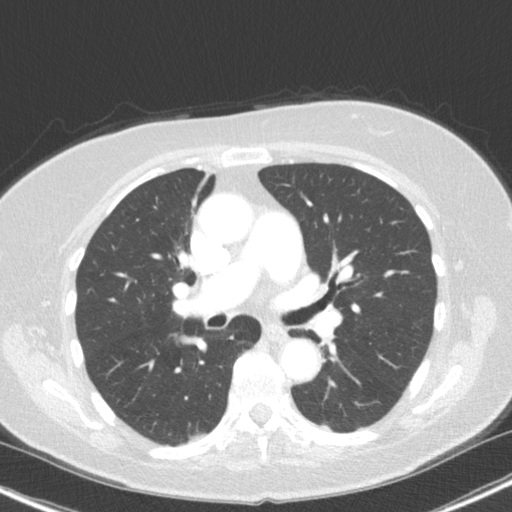
[im 105/157  lung]
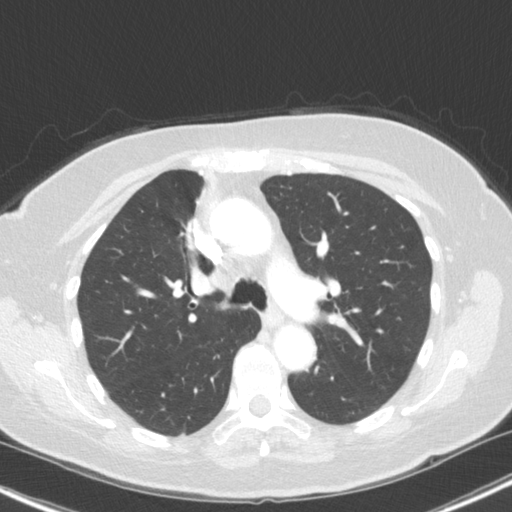
[im 116/157  lung]
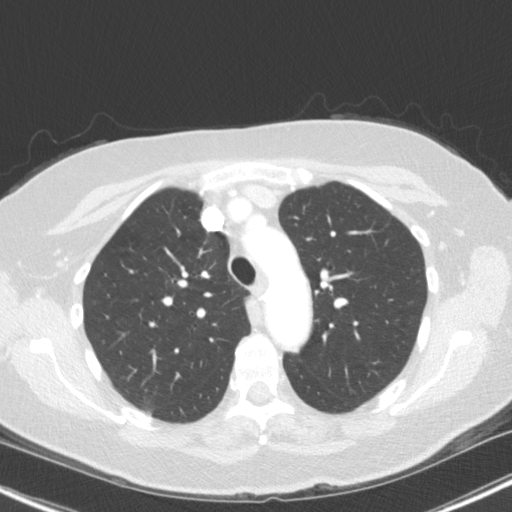
[im 125/157  mediastinal]
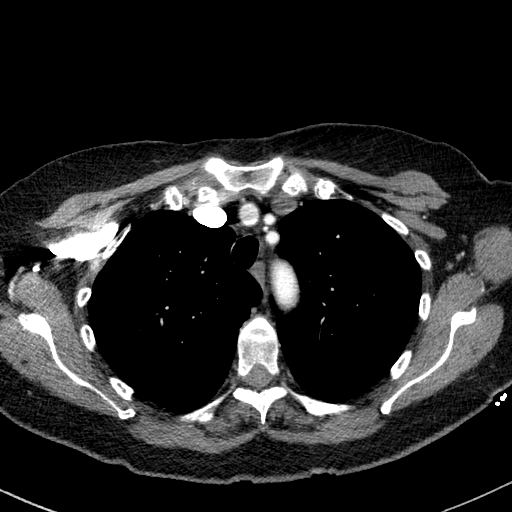
[im 125/157  lung]
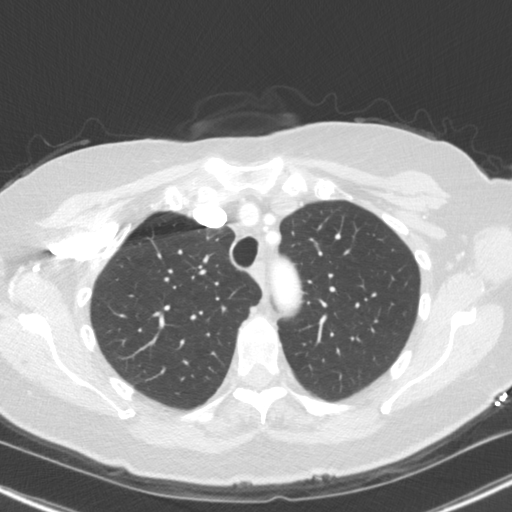
[im 133/157  lung]
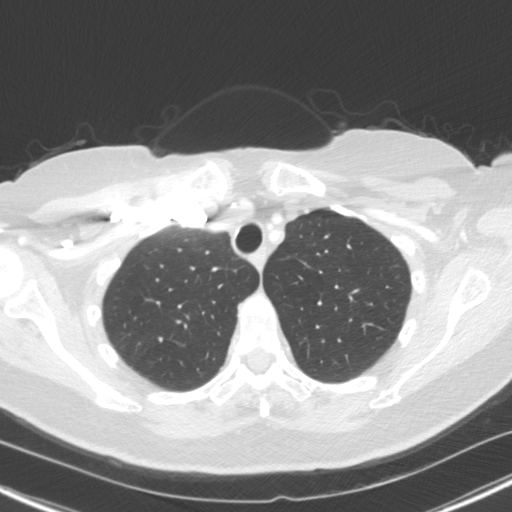
[im 145/157  lung]
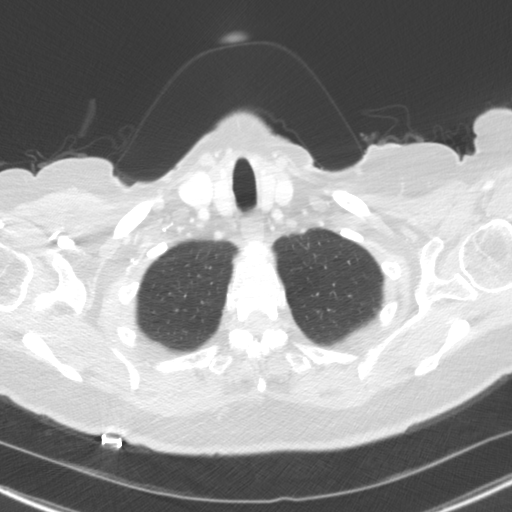

[15 of 34 positions shown; findings below may reference images not displayed]

RADIATION DOSE REDUCTION: This exam was performed according to the
departmental dose-optimization program which includes automated
exposure control, adjustment of the mA and/or kV according to
patient size and/or use of iterative reconstruction technique.

CONTRAST:  75mL [id number] IOPAMIDOL ([id number]) INJECTION 61%
FINDINGS: Cardiovascular: Moderate aortic atherosclerosis. No aneurysm.
Coronary vascular calcification. Normal cardiac size. No pericardial
effusion

Mediastinum/Nodes: Midline trachea. No thyroid mass. Subcarinal node
measuring 12 mm. Esophagus is normal.

Lungs/Pleura: Mild bronchiectasis in the right middle lobe and
bilateral lower lobes with scarring. No acute airspace disease,
pleural effusion or pneumothorax.

Upper Abdomen: No acute abnormality.

Musculoskeletal: No chest wall abnormality. No acute or significant
osseous findings.
IMPRESSION: 1. Negative for mediastinal mass lesion.
2. Mild bronchiectasis in the right middle and bilateral lower lobes
with pulmonary scarring

Aortic Atherosclerosis ([id number]-GXJ.J).

## 2023-05-01 MED ORDER — PREDNISONE 5 MG PO TABS: 5.0000 mg | ORAL_TABLET | Freq: Every day | ORAL | 5 refills | Status: DC

## 2023-05-01 NOTE — Patient Instructions (Addendum)
-  Continue Cellcept 500 mg BID -Decrease prednisone to 5 mg daily (or stay on 5 mg if you are already on this) -Please increase back to 10 mg if MG symptoms worsen and call the office. -Mestinon 60 mg as needed -Continue staying active -Discussed MG crisis, what to watch for, and when to go to nearest ED including difficulty breathing or swallowing   I would like to see you back in clinic in 3 months or sooner if needed.  The physicians and staff at [facility] are committed to providing excellent care. You may receive a survey requesting feedback about your experience at our office. We strive to receive "very good" responses to the survey questions. If you feel that your experience would prevent you from giving the office a "very good " response, please contact our office to try to remedy the situation. We may be reached at [phone]. Thank you for taking the time out of your busy day to complete the survey.  Jacquelyne Balint, MD [facility]

## 2023-05-22 ENCOUNTER — Other Ambulatory Visit: Payer: Self-pay | Admitting: Neurology

## 2023-05-25 DIAGNOSIS — L821 Other seborrheic keratosis: Secondary | ICD-10-CM | POA: Diagnosis not present

## 2023-05-25 DIAGNOSIS — L853 Xerosis cutis: Secondary | ICD-10-CM | POA: Diagnosis not present

## 2023-05-25 DIAGNOSIS — L814 Other melanin hyperpigmentation: Secondary | ICD-10-CM | POA: Diagnosis not present

## 2023-05-25 DIAGNOSIS — D692 Other nonthrombocytopenic purpura: Secondary | ICD-10-CM | POA: Diagnosis not present

## 2023-05-25 DIAGNOSIS — L820 Inflamed seborrheic keratosis: Secondary | ICD-10-CM | POA: Diagnosis not present

## 2023-06-01 DIAGNOSIS — H01009 Unspecified blepharitis unspecified eye, unspecified eyelid: Secondary | ICD-10-CM | POA: Diagnosis not present

## 2023-06-01 DIAGNOSIS — H11123 Conjunctival concretions, bilateral: Secondary | ICD-10-CM | POA: Diagnosis not present

## 2023-06-08 ENCOUNTER — Ambulatory Visit: Payer: Medicare PPO | Admitting: Internal Medicine

## 2023-06-08 ENCOUNTER — Encounter: Payer: Self-pay | Admitting: Internal Medicine

## 2023-06-08 VITALS — BP 136/80 | HR 56 | Temp 97.8°F | Ht 65.0 in | Wt 158.0 lb

## 2023-06-08 DIAGNOSIS — F39 Unspecified mood [affective] disorder: Secondary | ICD-10-CM | POA: Diagnosis not present

## 2023-06-08 DIAGNOSIS — R82998 Other abnormal findings in urine: Secondary | ICD-10-CM | POA: Insufficient documentation

## 2023-06-08 MED ORDER — SERTRALINE HCL 25 MG PO TABS: 25.0000 mg | ORAL_TABLET | Freq: Every day | ORAL | 3 refills | Status: DC

## 2023-06-08 NOTE — Assessment & Plan Note (Signed)
Has ongoing reactive changes---doesn't seem to be MDD (mostly because it is clearly reactive) Now worse though--daily ---and worsening issues with caregive stress, etc  Discussed options Looking into grief counselor---for coping skills (through [facility]) Will start low dose sertraline 25mg  daily Recheck 1 month

## 2023-06-08 NOTE — Assessment & Plan Note (Signed)
Can't give specimen--but doesn't really have symptoms of infection She will increase her fluids Can go to on campus clinic if increased symptoms

## 2023-06-08 NOTE — Progress Notes (Signed)
Subjective:    Patient ID: Jennifer Schmitt, female    DOB: February 13, 1935, 88 y.o.   MRN: [id number]  HPI Here due to worsening mood issues  Having crying spells Tough time due to husband's dementia ---and living with myasthenia Has lorazepam--but only takes it rarely  Will cry when talks to people about her husband Daily depressed feeling Able to enjoy things--but trouble getting out to do things (husband does go to day program) Anxious about his care---worries he will refuse the day care (like if she has plans)  Thinks about dying --due to age No suicidal thoughts No trouble with self worth--knows she is essential to husband, etc  Current Outpatient Medications on File Prior to Visit  Medication Sig Dispense Refill   acetaminophen (TYLENOL) 500 MG tablet Take 1,000 mg by mouth as needed.     amiodarone (PACERONE) 200 MG tablet Take 1 tablet (200 mg total) by mouth daily. 90 tablet 0   BD SYRINGE SLIP TIP 25G X 5/8" 1 ML MISC      calcium carbonate (CALCIUM 600) 600 MG TABS tablet Take 1 tablet (600 mg total) by mouth daily. 30 tablet 0   Cholecalciferol (VITAMIN D) 2000 units CAPS Take 2,000 Units by mouth daily.     cyanocobalamin (VITAMIN B12) 1000 MCG/ML injection INJECT 1 ML MONTHLY AS DIRECTED 1 mL 11   diltiazem (CARDIZEM) 30 MG tablet Take 1 tablet (30 mg total) by mouth 3 (three) times daily as needed (atrial fibrillation lasting more than 2 hours). 60 tablet 3   fluticasone (FLONASE) 50 MCG/ACT nasal spray Place 1 spray into both nostrils daily.     folic acid (FOLVITE) 1 MG tablet TAKE 1 TABLET BY MOUTH DAILY 90 tablet 3   guaiFENesin-codeine (ROBITUSSIN AC) 100-10 MG/5ML syrup Take 5 mLs by mouth 3 (three) times daily as needed for cough. 120 mL 0   hydrocortisone 2.5 % cream Apply topically 3 (three) times daily as needed. 28 g 3   lansoprazole (PREVACID) 15 MG capsule TAKE ONE CAPSULE BY MOUTH DAILY AT 12 (NOON) 30 capsule 2   LORazepam (ATIVAN) 0.5 MG tablet TAKE ONE  HALF TO 1 TABLET BY MOUTH TWICEDAILY AS NEEDED FOR ANXIETY 30 tablet 0   metoprolol succinate (TOPROL-XL) 25 MG 24 hr tablet TAKE ONE TABLET BY MOUTH DAILY 90 tablet 0   MIEBO 1.338 GM/ML SOLN Apply 1 drop to eye daily.     Multiple Vitamins-Minerals (PRESERVISION AREDS 2 PO) Take by mouth daily.     mycophenolate (CELLCEPT) 500 MG tablet Take 1 tablet (500 mg total) by mouth 2 (two) times daily. 60 tablet 11   predniSONE (DELTASONE) 5 MG tablet Take 1 tablet (5 mg total) by mouth daily with breakfast. 30 tablet 5   propranolol (INDERAL) 10 MG tablet Take 1 tablet (10 mg total) by mouth 3 (three) times daily. daily as needed (for breakthrough atrial fibrillation) 60 tablet 1   pyridostigmine (MESTINON) 60 MG tablet Take 1 tablet at 7 AM, 1 tablet at 1 PM and 1 tablet at 7 PM 90 tablet 5   rosuvastatin (CRESTOR) 5 MG tablet Take 1 tablet (5 mg total) by mouth daily. 90 tablet 3   Trolamine Salicylate (ASPERCREME EX) Apply topically as needed.     valsartan (DIOVAN) 80 MG tablet Take 0.5 tablets (40 mg total) by mouth daily. 90 tablet 1   XARELTO 20 MG TABS tablet TAKE 1 TABLET BY MOUTH ONCE A DAY WITH THE EVENING MEAL. 90 tablet  1   No current facility-administered medications on file prior to visit.    Allergies  Allergen Reactions   Actonel [Risedronate Sodium] Nausea And Vomiting   Boniva [Ibandronic Acid] Nausea And Vomiting   Diclofenac Other (See Comments)    Voltaren Gel aggravated her sinus passages   Fosamax [Alendronate Sodium] Nausea And Vomiting   Lipitor [Atorvastatin] Other (See Comments)    Muscle aches. Tolerates Crestor.   Miacalcin [Calcitonin (Salmon)] Other (See Comments)    Sneezing    Myrbetriq [Mirabegron] Other (See Comments)    Gastritis   Talwin [Pentazocine] Other (See Comments)    Hallucinations    Latex Rash    Sneezing, watery eyes.   Niaspan [Niacin Er] Swelling and Rash    Past Medical History:  Diagnosis Date   Adenomatous colon polyp    Allergic  rhinitis due to pollen    Anemia    Anxiety    Bronchiectasis (HCC)    Complication of anesthesia    Degenerative disc disease    cervical and lumbar   Dysrhythmia    GERD (gastroesophageal reflux disease)    Hearing loss in right ear    Heart murmur    HOH (hard of hearing)    AIDS   Hyperlipidemia    Hypertension    Osteoarthritis, multiple sites    PAF (paroxysmal atrial fibrillation) (HCC)    a. 01/2013 Echo [facility]): EF 60-65%, no rwma, mildly dil LA/RA, mild AI/TR, trace MR, nl RV size/fxn, mild PAH; b. CHA2DS2VASc = 4-->Chronic Xarelto.   PMR (polymyalgia rheumatica) (HCC)    PONV (postoperative nausea and vomiting)    Rheumatic fever    Sleep apnea    NO CPAP   Urge incontinence    Vertigo    Vertigo     Past Surgical History:  Procedure Laterality Date   ABDOMINAL HYSTERECTOMY     ANKLE FRACTURE SURGERY Left 1996   BACK SURGERY     BREAST BIOPSY Left 2010   negative   CATARACT EXTRACTION W/PHACO Left 10/05/2018   Procedure: CATARACT EXTRACTION PHACO AND INTRAOCULAR LENS PLACEMENT (IOC);  Surgeon: Galen Manila, MD;  Location: [facility] ORS;  Service: Ophthalmology;  Laterality: Left;  [place] 00:40 CDE 6.92 fluid pack lot # [id number]      CATARACT EXTRACTION W/PHACO Right 10/26/2018   Procedure: CATARACT EXTRACTION PHACO AND INTRAOCULAR LENS PLACEMENT (IOC);  Surgeon: Galen Manila, MD;  Location: [facility] ORS;  Service: Ophthalmology;  Laterality: Right;  [place]  00:47 CDE 7.11 Fluid pack lot # [id number]   DEXA  11/09/06   normal   FRACTURE SURGERY     Meningitis  1963   SPINE SURGERY     SYNOVECTOMY Right 1973   elbow   TONSILLECTOMY AND ADENOIDECTOMY      Family History  Problem Relation Age of Onset   Schizophrenia Mother        paranoid   Heart disease Mother        from psych meds   Arthritis Mother    Rheum arthritis Mother    Cancer Father        prostate   Arthritis Father    Diabetes Father    Breast cancer Paternal Aunt 25   Ovarian  cancer Maternal Grandmother    Uterine cancer Maternal Grandmother    Colon cancer Maternal Grandmother    Healthy Son     Social History   Socioeconomic History   Marital status: Married    Spouse name: Not on  file   Number of children: 1   Years of education: Not on file   Highest education level: Not on file  Occupational History   Occupation: Production designer, theatre/television/film and college professor    Comment: [employer]--PhD in Special education  Tobacco Use   Smoking status: Never    Passive exposure: Never   Smokeless tobacco: Never  Vaping Use   Vaping Use: Never used  Substance and Sexual Activity   Alcohol use: Yes   Drug use: No   Sexual activity: Not on file  Other Topics Concern   Not on file  Social History Narrative   Retired 2007    1 son in [place] area   Spends part time in [place]      Has living will   Husband then son is health care POA--will need to be son with husband's decline   Not sure about DNR---will keep open resuscitation for now    No tube feedings if cognitively unaware   Right handed               Social Determinants of Health   Financial Resource Strain: Not on file  Food Insecurity: Not on file  Transportation Needs: Not on file  Physical Activity: Not on file  Stress: Not on file  Social Connections: Not on file  Intimate Partner Violence: Not on file   Review of Systems Eating okay--not much though Weight has stabilized since losing 50# with myasthenia Not sleeping well--"horrible nightmares" Some dark urine--rare burning    Objective:   Physical Exam Constitutional:      Appearance: Normal appearance.  Neurological:     Mental Status: She is alert.  Psychiatric:     Comments: Somewhat tearful--but normal appearance and speech Good insight Judgement not impaired No psychotic features            Assessment & Plan:

## 2023-06-12 ENCOUNTER — Ambulatory Visit: Payer: Medicare PPO | Admitting: Podiatry

## 2023-06-12 ENCOUNTER — Encounter: Payer: Self-pay | Admitting: Podiatry

## 2023-06-12 VITALS — BP 157/60 | HR 51

## 2023-06-12 DIAGNOSIS — B351 Tinea unguium: Secondary | ICD-10-CM

## 2023-06-12 DIAGNOSIS — M79674 Pain in right toe(s): Secondary | ICD-10-CM | POA: Diagnosis not present

## 2023-06-12 DIAGNOSIS — M79675 Pain in left toe(s): Secondary | ICD-10-CM

## 2023-06-12 NOTE — Progress Notes (Signed)
Chief Complaint  Patient presents with   Ingrown Toenail    "My toenails are ingrown down the side." N - toenails painful L - hallux bilateral D - 6 months O - gradually worse C - ingrown, sore, curved A - none T - I get manicures    SUBJECTIVE Patient presents to office today complaining of elongated, thickened nails that cause pain while ambulating in shoes.  Patient is unable to trim their own nails. Patient is here for further evaluation and treatment.  Past Medical History:  Diagnosis Date   Adenomatous colon polyp    Allergic rhinitis due to pollen    Anemia    Anxiety    Bronchiectasis (HCC)    Complication of anesthesia    Degenerative disc disease    cervical and lumbar   Dysrhythmia    GERD (gastroesophageal reflux disease)    Hearing loss in right ear    Heart murmur    HOH (hard of hearing)    AIDS   Hyperlipidemia    Hypertension    Osteoarthritis, multiple sites    PAF (paroxysmal atrial fibrillation) (HCC)    a. 01/2013 Echo [facility]): EF 60-65%, no rwma, mildly dil LA/RA, mild AI/TR, trace MR, nl RV size/fxn, mild PAH; b. CHA2DS2VASc = 4-->Chronic Xarelto.   PMR (polymyalgia rheumatica) (HCC)    PONV (postoperative nausea and vomiting)    Rheumatic fever    Sleep apnea    NO CPAP   Urge incontinence    Vertigo    Vertigo     Allergies  Allergen Reactions   Actonel [Risedronate Sodium] Nausea And Vomiting   Boniva [Ibandronic Acid] Nausea And Vomiting   Diclofenac Other (See Comments)    Voltaren Gel aggravated her sinus passages   Fosamax [Alendronate Sodium] Nausea And Vomiting   Lipitor [Atorvastatin] Other (See Comments)    Muscle aches. Tolerates Crestor.   Miacalcin [Calcitonin ([place])] Other (See Comments)    Sneezing    Myrbetriq [Mirabegron] Other (See Comments)    Gastritis   Talwin [Pentazocine] Other (See Comments)    Hallucinations    Latex Rash    Sneezing, watery eyes.   Niaspan [Niacin Er] Swelling and Rash      OBJECTIVE General Patient is awake, alert, and oriented x 3 and in no acute distress. Derm Skin is dry and supple bilateral. Negative open lesions or macerations. Remaining integument unremarkable. Nails are tender, long, thickened and dystrophic with subungual debris, consistent with onychomycosis, 1-5 bilateral. No signs of infection noted. Vasc  DP and PT pedal pulses palpable bilaterally. Temperature gradient within normal limits.  Neuro Epicritic and protective threshold sensation grossly intact bilaterally.  Musculoskeletal Exam No symptomatic pedal deformities noted bilateral. Muscular strength within normal limits.  ASSESSMENT 1.  Pain due to onychomycosis of toenails both  PLAN OF CARE 1. Patient evaluated today.  2. Instructed to maintain good pedal hygiene and foot care.  3. Mechanical debridement of nails 1-5 bilaterally performed using a nail nipper. Filed with dremel without incident.  4. Return to clinic in 3 mos.    Felecia Shelling, DPM [facility]  Dr. Felecia Shelling, DPM    [address], [place] [address]  Office [phone]  Fax [phone]

## 2023-06-29 NOTE — Progress Notes (Deleted)
Cardiology Office Note  Date:  06/29/2023   ID:  Jennifer Schmitt, DOB 1935-07-05, MRN [id number]  PCP:  Karie Schwalbe, MD   No chief complaint on file.   HPI:  Jennifer Schmitt is a very pleasant 88 year old woman with history of  paroxysmal atrial fibrillation,  obstructive sleep apnea who does not wear CPAP,  hypertension,  hyperlipidemia  High burdern PVCs Senia gravis who presents for routine followup of her atrial fibrillation, PVCs  Last office visit with myself 7/23 Seen by Dr. Lalla Brothers March 2024 On amiodarone for atrial fibrillation and Xarelto  Seen by EP 05/21/22: started on amiodarone for PAF, PVCs  Viral URI earlier in the year Dx with myasthenia gravis, past ferw months, seen by Dr. Everlena Cooper Weakness in her back and legs, core, neck brace on today Presents in wheelchair Recent falls, no injury  On prednisone/cellcept, declined IVIG Doing OT and PT Feels she is slowly improving  Denies any tachypalpitations concerning for atrial fibrillation or PVCs  Significant stress taking care of her husband who has dementia  EKG personally reviewed by myself on todays visit Normal sinus rhythm rate 59 bpm rare PVCs  Echo 4/23 reviewed in detail on today's visit 1. Left ventricular ejection fraction, by estimation, is 55 to 60%. The  left ventricle has normal function. The left ventricle has no regional  wall motion abnormalities. Left ventricular diastolic parameters are  consistent with Grade I diastolic  dysfunction (impaired relaxation). The average left ventricular global  longitudinal strain is -18.4 %.   2. Right ventricular systolic function is normal. The right ventricular  size is normal. There is mildly elevated pulmonary artery systolic  pressure. The estimated right ventricular systolic pressure is 42.7 mmHg.   3. The mitral valve is normal in structure. No evidence of mitral valve  regurgitation. No evidence of mitral stenosis.   4. The aortic valve is  tricuspid. Aortic valve regurgitation is mild.  Aortic valve sclerosis/calcification is present, without any evidence of  aortic stenosis.   5. The inferior vena cava is normal in size with greater than 50%   Event monitor reviewed on today's visit Patch Wear Time:  3 days and 0 hours (2023-03-30T17:20:10-399 to 2023-04-02T17:33:05-399)   Normal sinus rhythm Patient had a min HR of 49 bpm, max HR of 164 bpm, and avg HR of 73 bpm   3 Supraventricular Tachycardia runs occurred, the run with the fastest interval lasting 5 beats with a max rate of 164 bpm, the longest lasting 5 beats with an avg rate of 114 bpm.   Isolated SVEs were rare (<1.0%), SVE Couplets were rare (<1.0%), and SVE Triplets were rare (<1.0%).    Isolated VEs were frequent (22.5%, 71540), VE Couplets were rare (<1.0%, 165), and VE Triplets were rare (<1.0%, 2). Ventricular Bigeminy and Trigeminy were present.    Triggered events associated with normal sinus rhythm with frequent PVCs  Family history Parents with no CAD, no MI Grandmother with CHF  Other past medical history reviewed Carotid u/s 2011: intimal thickening She reports that she had recent screening, was told she had no significant carotid disease. She will bring in the report for our review   05/31/2015 had severe vomiting, went to the emergency room Electrolytes were normal, no atrial fibrillation Lab work reviewed with her today from recent lab draw, total cholesterol 160 B-12 improving, sedimentation rate 40   She reports having an episode of atrial fibrillation in August 2015 It lasted approximately 6 hours. She took  diltiazem and propranolol.  she converted back to normal sinus rhythm  PMH:   has a past medical history of Adenomatous colon polyp, Allergic rhinitis due to pollen, Anemia, Anxiety, Bronchiectasis (HCC), Complication of anesthesia, Degenerative disc disease, Dysrhythmia, GERD (gastroesophageal reflux disease), Hearing loss in right  ear, Heart murmur, HOH (hard of hearing), Hyperlipidemia, Hypertension, Osteoarthritis, multiple sites, PAF (paroxysmal atrial fibrillation) (HCC), PMR (polymyalgia rheumatica) (HCC), PONV (postoperative nausea and vomiting), Rheumatic fever, Sleep apnea, Urge incontinence, Vertigo, and Vertigo.  PSH:    Past Surgical History:  Procedure Laterality Date   ABDOMINAL HYSTERECTOMY     ANKLE FRACTURE SURGERY Left 1996   BACK SURGERY     BREAST BIOPSY Left 2010   negative   CATARACT EXTRACTION W/PHACO Left 10/05/2018   Procedure: CATARACT EXTRACTION PHACO AND INTRAOCULAR LENS PLACEMENT (IOC);  Surgeon: Galen Manila, MD;  Location: [facility] ORS;  Service: Ophthalmology;  Laterality: Left;  [place] 00:40 CDE 6.92 fluid pack lot # [id number]      CATARACT EXTRACTION W/PHACO Right 10/26/2018   Procedure: CATARACT EXTRACTION PHACO AND INTRAOCULAR LENS PLACEMENT (IOC);  Surgeon: Galen Manila, MD;  Location: [facility] ORS;  Service: Ophthalmology;  Laterality: Right;  [place]  00:47 CDE 7.11 Fluid pack lot # [id number]   DEXA  11/09/06   normal   FRACTURE SURGERY     Meningitis  1963   SPINE SURGERY     SYNOVECTOMY Right 1973   elbow   TONSILLECTOMY AND ADENOIDECTOMY      Current Outpatient Medications  Medication Sig Dispense Refill   acetaminophen (TYLENOL) 500 MG tablet Take 1,000 mg by mouth as needed.     amiodarone (PACERONE) 200 MG tablet Take 1 tablet (200 mg total) by mouth daily. 90 tablet 0   BD SYRINGE SLIP TIP 25G X 5/8" 1 ML MISC      calcium carbonate (CALCIUM 600) 600 MG TABS tablet Take 1 tablet (600 mg total) by mouth daily. 30 tablet 0   Cholecalciferol (VITAMIN D) 2000 units CAPS Take 2,000 Units by mouth daily.     cyanocobalamin (VITAMIN B12) 1000 MCG/ML injection INJECT 1 ML MONTHLY AS DIRECTED 1 mL 11   diltiazem (CARDIZEM) 30 MG tablet Take 1 tablet (30 mg total) by mouth 3 (three) times daily as needed (atrial fibrillation lasting more than 2 hours). 60 tablet 3   fluticasone  (FLONASE) 50 MCG/ACT nasal spray Place 1 spray into both nostrils daily.     folic acid (FOLVITE) 1 MG tablet TAKE 1 TABLET BY MOUTH DAILY 90 tablet 3   guaiFENesin-codeine (ROBITUSSIN AC) 100-10 MG/5ML syrup Take 5 mLs by mouth 3 (three) times daily as needed for cough. 120 mL 0   hydrocortisone 2.5 % cream Apply topically 3 (three) times daily as needed. 28 g 3   lansoprazole (PREVACID) 15 MG capsule TAKE ONE CAPSULE BY MOUTH DAILY AT 12 (NOON) 30 capsule 2   LORazepam (ATIVAN) 0.5 MG tablet TAKE ONE HALF TO 1 TABLET BY MOUTH TWICEDAILY AS NEEDED FOR ANXIETY 30 tablet 0   metoprolol succinate (TOPROL-XL) 25 MG 24 hr tablet TAKE ONE TABLET BY MOUTH DAILY 90 tablet 0   MIEBO 1.338 GM/ML SOLN Apply 1 drop to eye daily.     Multiple Vitamins-Minerals (PRESERVISION AREDS 2 PO) Take by mouth daily.     mycophenolate (CELLCEPT) 500 MG tablet Take 1 tablet (500 mg total) by mouth 2 (two) times daily. 60 tablet 11   predniSONE (DELTASONE) 5 MG tablet Take 1 tablet (5 mg  total) by mouth daily with breakfast. 30 tablet 5   propranolol (INDERAL) 10 MG tablet Take 1 tablet (10 mg total) by mouth 3 (three) times daily. daily as needed (for breakthrough atrial fibrillation) 60 tablet 1   pyridostigmine (MESTINON) 60 MG tablet Take 1 tablet at 7 AM, 1 tablet at 1 PM and 1 tablet at 7 PM 90 tablet 5   rosuvastatin (CRESTOR) 5 MG tablet Take 1 tablet (5 mg total) by mouth daily. 90 tablet 3   sertraline (ZOLOFT) 25 MG tablet Take 1 tablet (25 mg total) by mouth daily. 30 tablet 3   Trolamine Salicylate (ASPERCREME EX) Apply topically as needed.     valsartan (DIOVAN) 80 MG tablet Take 0.5 tablets (40 mg total) by mouth daily. 90 tablet 1   XARELTO 20 MG TABS tablet TAKE 1 TABLET BY MOUTH ONCE A DAY WITH THE EVENING MEAL. 90 tablet 1   No current facility-administered medications for this visit.    Allergies:   Actonel [risedronate sodium], Boniva [ibandronic acid], Diclofenac, Fosamax [alendronate sodium],  Lipitor [atorvastatin], Miacalcin [calcitonin (salmon)], Myrbetriq [mirabegron], Talwin [pentazocine], Latex, and Niaspan [niacin er]   Social History:  The patient  reports that she has never smoked. She has never been exposed to tobacco smoke. She has never used smokeless tobacco. She reports that she does not currently use alcohol. She reports that she does not use drugs.   Family History:   family history includes Arthritis in her father and mother; Breast cancer (age of onset: 55) in her paternal aunt; Cancer in her father; Colon cancer in her maternal grandmother; Diabetes in her father; Healthy in her son; Heart disease in her mother; Ovarian cancer in her maternal grandmother; Rheum arthritis in her mother; Schizophrenia in her mother; Uterine cancer in her maternal grandmother.    Review of Systems: Review of Systems  Constitutional: Negative.   Respiratory: Negative.    Cardiovascular: Negative.   Gastrointestinal: Negative.   Musculoskeletal: Negative.   Neurological: Negative.   Psychiatric/Behavioral: Negative.    All other systems reviewed and are negative.   PHYSICAL EXAM: VS:  There were no vitals taken for this visit. , BMI There is no height or weight on file to calculate BMI. Constitutional:  oriented to person, place, and time. No distress.  HENT:  Head: Grossly normal Eyes:  no discharge. No scleral icterus.  Neck: No JVD, no carotid bruits  Cardiovascular: Regular rate and rhythm, no murmurs appreciated Pulmonary/Chest: Clear to auscultation bilaterally, no wheezes or rails Abdominal: Soft.  no distension.  no tenderness.  Musculoskeletal: Normal range of motion Neurological:  normal muscle tone. Coordination normal. No atrophy Skin: Skin warm and dry Psychiatric: normal affect, pleasant  Recent Labs: 03/13/2023: ALT 24; BUN 19; Creatinine, Ser 0.78; Potassium 3.9; Sodium 139; TSH 1.963 05/01/2023: Hemoglobin 12.0; Platelets 247   Lipid Panel Lab Results   Component Value Date   CHOL 146 11/29/2021   HDL 59.70 11/29/2021   LDLCALC 68 11/29/2021   TRIG 92.0 11/29/2021    Wt Readings from Last 3 Encounters:  06/08/23 158 lb (71.7 kg)  05/01/23 157 lb (71.2 kg)  03/11/23 154 lb (69.9 kg)     ASSESSMENT AND PLAN:  Paroxysmal atrial fibrillation (HCC) -  Maintaining normal sinus rhythm Continue metoprolol at 25 mg daily, amiodarone 200 daily Also has diltiazem 30 or propranolol 10 she can take as needed for breakthrough arrhythmia  Frequent PVCs High PVC burden on event monitor, normal ejection fraction Seen by  EP, on amiodarone, metoprolol No sx  Essential hypertension - Plan: EKG 12-Lead Blood pressure is well controlled on today's visit. No changes made to the medications.  Shortness of breath/fatigue/depression Workiing with PT and OT  Pure hypercholesterolemia On Crestor 5 daily  Myasthenia Gravis On cellcept, prednisone   Total encounter time more than 30 minutes  Greater than 50% was spent in counseling and coordination of care with the patient   No orders of the defined types were placed in this encounter.    Signed, Dossie Arbour, M.D., Ph.D. 06/29/2023  [facility] [place], [place] [phone]

## 2023-06-30 ENCOUNTER — Ambulatory Visit: Payer: Medicare PPO | Admitting: Cardiovascular Disease

## 2023-06-30 DIAGNOSIS — R0602 Shortness of breath: Secondary | ICD-10-CM

## 2023-06-30 DIAGNOSIS — G4733 Obstructive sleep apnea (adult) (pediatric): Secondary | ICD-10-CM

## 2023-06-30 DIAGNOSIS — I10 Essential (primary) hypertension: Secondary | ICD-10-CM

## 2023-06-30 DIAGNOSIS — E782 Mixed hyperlipidemia: Secondary | ICD-10-CM

## 2023-06-30 DIAGNOSIS — I4819 Other persistent atrial fibrillation: Secondary | ICD-10-CM

## 2023-06-30 DIAGNOSIS — G7000 Myasthenia gravis without (acute) exacerbation: Secondary | ICD-10-CM

## 2023-06-30 DIAGNOSIS — E7800 Pure hypercholesterolemia, unspecified: Secondary | ICD-10-CM

## 2023-07-09 ENCOUNTER — Ambulatory Visit: Payer: Medicare PPO | Admitting: Internal Medicine

## 2023-07-09 ENCOUNTER — Encounter: Payer: Self-pay | Admitting: Internal Medicine

## 2023-07-09 VITALS — BP 138/82 | HR 77 | Temp 97.8°F | Ht 65.0 in | Wt 157.0 lb

## 2023-07-09 DIAGNOSIS — F39 Unspecified mood [affective] disorder: Secondary | ICD-10-CM

## 2023-07-09 MED ORDER — LORAZEPAM 0.5 MG PO TABS
ORAL_TABLET | ORAL | 0 refills | Status: DC
Start: 2023-07-09 — End: 2024-02-24

## 2023-07-09 NOTE — Assessment & Plan Note (Signed)
Did not tolerate the sertraline--worse with irritability and worsened sleep Discussed trying something else--we will hiold off Waiting for upcoming counseling appt Has resources at [facility] as well

## 2023-07-09 NOTE — Progress Notes (Signed)
Subjective:    Patient ID: Jennifer Schmitt, female    DOB: 1935-11-22, 88 y.o.   MRN: [id number]  HPI Here for follow up of mood issues  Did try the sertraline but it made her more irritable, etc Stopped it a while ago Is counseling with pastor at [facility] awaiting visit with Clydie Braun here  Mood is not quite as bad now Does use the lorazepam--when her anxiety and stress get bad Husband does go to [employer] (day care) 4 days per week--he sometimes resists  Current Outpatient Medications on File Prior to Visit  Medication Sig Dispense Refill   acetaminophen (TYLENOL) 500 MG tablet Take 1,000 mg by mouth as needed.     amiodarone (PACERONE) 200 MG tablet Take 1 tablet (200 mg total) by mouth daily. 90 tablet 0   BD SYRINGE SLIP TIP 25G X 5/8" 1 ML MISC      calcium carbonate (CALCIUM 600) 600 MG TABS tablet Take 1 tablet (600 mg total) by mouth daily. 30 tablet 0   Cholecalciferol (VITAMIN D) 2000 units CAPS Take 2,000 Units by mouth daily.     cyanocobalamin (VITAMIN B12) 1000 MCG/ML injection INJECT 1 ML MONTHLY AS DIRECTED 1 mL 11   diltiazem (CARDIZEM) 30 MG tablet Take 1 tablet (30 mg total) by mouth 3 (three) times daily as needed (atrial fibrillation lasting more than 2 hours). 60 tablet 3   fluticasone (FLONASE) 50 MCG/ACT nasal spray Place 1 spray into both nostrils daily.     folic acid (FOLVITE) 1 MG tablet TAKE 1 TABLET BY MOUTH DAILY 90 tablet 3   guaiFENesin-codeine (ROBITUSSIN AC) 100-10 MG/5ML syrup Take 5 mLs by mouth 3 (three) times daily as needed for cough. 120 mL 0   hydrocortisone 2.5 % cream Apply topically 3 (three) times daily as needed. 28 g 3   lansoprazole (PREVACID) 15 MG capsule TAKE ONE CAPSULE BY MOUTH DAILY AT 12 (NOON) 30 capsule 2   metoprolol succinate (TOPROL-XL) 25 MG 24 hr tablet TAKE ONE TABLET BY MOUTH DAILY 90 tablet 0   MIEBO 1.338 GM/ML SOLN Apply 1 drop to eye daily.     Multiple Vitamins-Minerals (PRESERVISION AREDS 2 PO) Take by mouth  daily.     mycophenolate (CELLCEPT) 500 MG tablet Take 1 tablet (500 mg total) by mouth 2 (two) times daily. 60 tablet 11   predniSONE (DELTASONE) 5 MG tablet Take 1 tablet (5 mg total) by mouth daily with breakfast. 30 tablet 5   propranolol (INDERAL) 10 MG tablet Take 1 tablet (10 mg total) by mouth 3 (three) times daily. daily as needed (for breakthrough atrial fibrillation) 60 tablet 1   pyridostigmine (MESTINON) 60 MG tablet Take 1 tablet at 7 AM, 1 tablet at 1 PM and 1 tablet at 7 PM 90 tablet 5   rosuvastatin (CRESTOR) 5 MG tablet Take 1 tablet (5 mg total) by mouth daily. 90 tablet 3   Trolamine Salicylate (ASPERCREME EX) Apply topically as needed.     valsartan (DIOVAN) 80 MG tablet Take 0.5 tablets (40 mg total) by mouth daily. 90 tablet 1   XARELTO 20 MG TABS tablet TAKE 1 TABLET BY MOUTH ONCE A DAY WITH THE EVENING MEAL. 90 tablet 1   No current facility-administered medications on file prior to visit.    Allergies  Allergen Reactions   Actonel [Risedronate Sodium] Nausea And Vomiting   Boniva [Ibandronic Acid] Nausea And Vomiting   Diclofenac Other (See Comments)    Voltaren Gel  aggravated her sinus passages   Fosamax [Alendronate Sodium] Nausea And Vomiting   Lipitor [Atorvastatin] Other (See Comments)    Muscle aches. Tolerates Crestor.   Miacalcin [Calcitonin ([place])] Other (See Comments)    Sneezing    Myrbetriq [Mirabegron] Other (See Comments)    Gastritis   Sertraline Hcl Other (See Comments)    Irritable, etc   Talwin [Pentazocine] Other (See Comments)    Hallucinations    Latex Rash    Sneezing, watery eyes.   Niaspan [Niacin Er] Swelling and Rash    Past Medical History:  Diagnosis Date   Adenomatous colon polyp    Allergic rhinitis due to pollen    Anemia    Anxiety    Bronchiectasis (HCC)    Complication of anesthesia    Degenerative disc disease    cervical and lumbar   Dysrhythmia    GERD (gastroesophageal reflux disease)    Hearing loss in  right ear    Heart murmur    HOH (hard of hearing)    AIDS   Hyperlipidemia    Hypertension    Osteoarthritis, multiple sites    PAF (paroxysmal atrial fibrillation) (HCC)    a. 01/2013 [facility]): EF 60-65%, no rwma, mildly dil LA/RA, mild AI/TR, trace MR, nl RV size/fxn, mild PAH; b. CHA2DS2VASc = 4-->Chronic Xarelto.   PMR (polymyalgia rheumatica) (HCC)    PONV (postoperative nausea and vomiting)    Rheumatic fever    Sleep apnea    NO CPAP   Urge incontinence    Vertigo    Vertigo     Past Surgical History:  Procedure Laterality Date   ABDOMINAL HYSTERECTOMY     ANKLE FRACTURE SURGERY Left 1996   BACK SURGERY     BREAST BIOPSY Left 2010   negative   CATARACT EXTRACTION W/PHACO Left 10/05/2018   Procedure: CATARACT EXTRACTION PHACO AND INTRAOCULAR LENS PLACEMENT (IOC);  Surgeon: Galen Manila, MD;  Location: [facility] ORS;  Service: Ophthalmology;  Laterality: Left;  [place] 00:40 CDE 6.92 fluid pack lot # [id number]      CATARACT EXTRACTION W/PHACO Right 10/26/2018   Procedure: CATARACT EXTRACTION PHACO AND INTRAOCULAR LENS PLACEMENT (IOC);  Surgeon: Galen Manila, MD;  Location: [facility] ORS;  Service: Ophthalmology;  Laterality: Right;  [place]  00:47 CDE 7.11 Fluid pack lot # [id number]   DEXA  11/09/06   normal   FRACTURE SURGERY     Meningitis  1963   SPINE SURGERY     SYNOVECTOMY Right 1973   elbow   TONSILLECTOMY AND ADENOIDECTOMY      Family History  Problem Relation Age of Onset   Schizophrenia Mother        paranoid   Heart disease Mother        from psych meds   Arthritis Mother    Rheum arthritis Mother    Cancer Father        prostate   Arthritis Father    Diabetes Father    Breast cancer Paternal Aunt 54   Ovarian cancer Maternal Grandmother    Uterine cancer Maternal Grandmother    Colon cancer Maternal Grandmother    Healthy Son     Social History   Socioeconomic History   Marital status: Married    Spouse name: Not on file    Number of children: 1   Years of education: Not on file   Highest education level: Not on file  Occupational History   Occupation: Production designer, theatre/television/film and college professor  Comment: [employer]--PhD in Special education  Tobacco Use   Smoking status: Never    Passive exposure: Never   Smokeless tobacco: Never  Vaping Use   Vaping status: Never Used  Substance and Sexual Activity   Alcohol use: Not Currently   Drug use: No   Sexual activity: Not on file  Other Topics Concern   Not on file  Social History Narrative   Retired 2007    1 son in [place] area   Spends part time in [place]      Has living will   Husband then son is health care POA--will need to be son with husband's decline   Not sure about DNR---will keep open resuscitation for now    No tube feedings if cognitively unaware   Right handed               Social Determinants of Health   Financial Resource Strain: Not on file  Food Insecurity: Not on file  Transportation Needs: Not on file  Physical Activity: Not on file  Stress: Not on file  Social Connections: Not on file  Intimate Partner Violence: Not on file   Review of Systems Eating okay Sleeping is erratic--got worse with the sertraline. Will occasionally use 1/2 lorazepam to help her get to sleep    Objective:   Physical Exam Constitutional:      Appearance: Normal appearance.  Neurological:     Mental Status: She is alert.  Psychiatric:     Comments: Brighter today            Assessment & Plan:

## 2023-07-13 ENCOUNTER — Other Ambulatory Visit: Payer: Self-pay | Admitting: Cardiovascular Disease

## 2023-07-13 ENCOUNTER — Other Ambulatory Visit: Payer: Self-pay | Admitting: Cardiology

## 2023-07-13 NOTE — Telephone Encounter (Signed)
Please review

## 2023-07-13 NOTE — Telephone Encounter (Signed)
Prescription refill request for Xarelto received.  Indication:afib Last office visit:3/24 Weight:71.2  kg Age:88 Scr:0.78  3/24 CrCl:59.27  ml/min  Prescription refilled

## 2023-07-22 NOTE — Progress Notes (Deleted)
I saw Jennifer Schmitt in neurology clinic on 07/29/23 in follow up for AChR ab positive myasthenia gravis.  HPI: Jennifer Schmitt is a 88 y.o. year old female with a history of seropositive myasthenia gravis, polymyalgia rheumatica, HTN, HLD, pAfib, and OA who we last saw on 05/01/23.  To briefly review: Patient was previously seen in this office by Dr. Everlena Cooper (last seen on 06/05/22). Per initial clinic note on 05/08/22: Since January-February 2023, she has had progressive weakness.  She began having trouble rolling over in bed.  She was unable to get up and off her bed so she started sleeping on the couch..  Noted weakness in the left leg.  Seems to be dragging her right leg.  When standing, always with sensation that legs will give out. She has had falls and unable to stand up, requiring to call EMS.  Notes particular weakness in the proximal arms and trouble keeping her head up.  Started relying on a cane more frequently and then wheelchair.  She also endorsed worsening musculoskeletal pain with pain from the neck down her spine to her sacrum.  She does have degenerative spine disease and polymyalgia rheumatica.  MRI of brain with and without contrast on 04/27/2022 personally reviewed showed moderate chronic small vessel ischemic changes and known 1 x 2 mm vestibular schwannoma within the right internal auditory canal, stable compared to prior imaging in 2017.  She started noticing that her left eyelid was drooping.  She went to the ophthalmologist who suspected temporal arteritis given her history of PMR.  No vision loss.  Sed rate and CRP on 04/23/2022 were 101 and 10.1 respectively and was started on prednisone 60mg  daily with some improvement.  Based on strong likelihood of giant cell arteritis with elevated inflammatory markers and having PMR, temporal artery biopsy was not pursued.  She was started on prednisone 60mg  daily but discontinued after 3 days due to side effects.  She also has had increased  urinary urgency and sometimes fecal incontinence.  Due to sacral pain following a fall, she had X-ray of lumbar spine and MRI of sacrum which were negative for occult fracture.  AChR Binding Ab on 04/28/2022 was elevated at 28.10.  PCP started her on pyridostigmine 30mg  three times daily.  She notes improvement in strength in the legs.  However, she still has a head drop.  Denies double vision, trouble swallowing, trouble chewing, and trouble taking a breath.   At 05/08/22 visit, Dr. Everlena Cooper recommended prednisone 20 mg daily, mestinon 60 mg TID (7 am, 1 pm, 7 pm). CT chest on 05/15/22 was negative for mediastinal mass (no evidence of thymoma). Patient discontinued prednisone shortly after starting as it made her jittery and not able to sleep. At follow up with Dr. Everlena Cooper on 06/05/22, patient was not feeling well, but was walking a little better. She still had head drop. Hospital admission for a course of IVIg was suggested to prevent MG crisis, but patient declined as she needed to be home to take care of her husband with Alzheimer's disease. Cellcept 500 mg BID was started with IVIg every 28 days until Cellcept was therapeutic. She was also continued on mestinon. CBC and CMP were set up to be check weekly for 4 weeks, followed by monthly for 3 months. Patient called the next day though because she did not want IVIg. She decided to restart prednisone instead.   She felt much improved on 09/05/22. She is about 75% better than prior visit.  Patient lives at a retirement with healthcare and PT/OT. She has many resources there. Patient went to PT and OT. She is currently done with both. Husband is currently in a memory care unit twice a week.   Current medications: Cellcept 500 mg BID, Prednisone 20 mg once in the morning, Mestinon 60 mg TID (7:30-8am, 12pm, 8-9pm).   Side effects: Cellcept is difficult to take on a empty stomach. She gets a little nausea but this is moderately improved. She has hot flashes or  temperature fluctuations that are episodic. She is otherwise not having significant side effects.   MG symptoms on 09/05/22: Ptosis: Some drooping on left, pretty constant; improved compared to prior, worse when tired, not bothersome Double vision: Never Speech: Occasionally slurred speech, especially when tired; no one else commenting on it per patient Chewing: No problems currently Swallowing: Occasionally, episodic difficulty requiring a double swallow; no coughing Breathing: No problems currently Arm strength: No problems; Neck is still weak, but much improved. Cannot sit up straight for long periods of time Leg strength: Walks with a walker. She feels like her legs are strong. She has a walker due to not being able to stand up right.   At her visit on 09/05/22, we continued prednisone 20 mg daily, Cellcept 500 mg BID, but started tapering her mestinon (stopped on 09/11/22). I called the patient on 09/12/22 at which time she noted no change in her symptoms off mestinon. As a result, we agreed to begin tapering her prednisone. She decreased to 15 mg on 09/13/22.   At 02/12/23 visit, patient was doing well with minimal symptoms. She was on prednisone 10 mg daily Cellcept 500 mg BID and not taking mestinon. Prednisone was decreased to 7.5 mg daily on 02/12/23 (other medications kept the same). She mentioned neck and back pain and numbness in the 5th digit of bilateral hands at this visit.  At 05/01/23 visit, patient was doing well from an MG perspective. There was some confusion on whether she was currently on 5 mg or 7.5 mg of prednisone (supposed to be on 7.5 mg but thinks she was taking 5 mg).  Most recent Assessment and Plan (05/01/23): This is Jennifer Schmitt, a 88 y.o. female with AChR ab positive generalized myasthenia gravis (symptom onset 12/2021 with diagnosis 04/28/2022). CT chest on 05/13/22 with no mediastinal mass.    Plan: -Blood work: CBC w/ diff -Continue Cellcept 500 mg BID -Decrease  prednisone to 5 mg daily -Patient instructed to increase back to 10 mg if MG symptoms worsen and call the office. -Mestinon 60 mg as needed -Continue staying active -Discussed MG crisis, what to watch for, and when to go to nearest ED including difficulty breathing or swallowing   Since their last visit: ***  Current MG symptoms: Ptosis: *** Double vision: *** Speech: *** Chewing: *** Swallowing: *** Breathing: *** Arm strength: *** Leg strength: ***  Current medications:  ***  Side effects: ***   ROS: Pertinent positive and negative systems reviewed in HPI. ***   MEDICATIONS:  Outpatient Encounter Medications as of 07/29/2023  Medication Sig   acetaminophen (TYLENOL) 500 MG tablet Take 1,000 mg by mouth as needed.   amiodarone (PACERONE) 200 MG tablet Take 1 tablet (200 mg total) by mouth daily.   BD SYRINGE SLIP TIP 25G X 5/8" 1 ML MISC    calcium carbonate (CALCIUM 600) 600 MG TABS tablet Take 1 tablet (600 mg total) by mouth daily.   Cholecalciferol (VITAMIN D) 2000 units  CAPS Take 2,000 Units by mouth daily.   cyanocobalamin (VITAMIN B12) 1000 MCG/ML injection INJECT 1 ML MONTHLY AS DIRECTED   diltiazem (CARDIZEM) 30 MG tablet Take 1 tablet (30 mg total) by mouth 3 (three) times daily as needed (atrial fibrillation lasting more than 2 hours).   fluticasone (FLONASE) 50 MCG/ACT nasal spray Place 1 spray into both nostrils daily.   folic acid (FOLVITE) 1 MG tablet TAKE 1 TABLET BY MOUTH DAILY   guaiFENesin-codeine (ROBITUSSIN AC) 100-10 MG/5ML syrup Take 5 mLs by mouth 3 (three) times daily as needed for cough.   hydrocortisone 2.5 % cream Apply topically 3 (three) times daily as needed.   lansoprazole (PREVACID) 15 MG capsule TAKE ONE CAPSULE BY MOUTH DAILY AT 12 (NOON)   LORazepam (ATIVAN) 0.5 MG tablet TAKE ONE HALF TO 1 TABLET BY MOUTH TWICEDAILY AS NEEDED FOR ANXIETY   metoprolol succinate (TOPROL-XL) 25 MG 24 hr tablet TAKE ONE TABLET BY MOUTH DAILY   MIEBO 1.338  GM/ML SOLN Apply 1 drop to eye daily.   Multiple Vitamins-Minerals (PRESERVISION AREDS 2 PO) Take by mouth daily.   mycophenolate (CELLCEPT) 500 MG tablet Take 1 tablet (500 mg total) by mouth 2 (two) times daily.   predniSONE (DELTASONE) 5 MG tablet Take 1 tablet (5 mg total) by mouth daily with breakfast.   propranolol (INDERAL) 10 MG tablet Take 1 tablet (10 mg total) by mouth 3 (three) times daily. daily as needed (for breakthrough atrial fibrillation)   pyridostigmine (MESTINON) 60 MG tablet Take 1 tablet at 7 AM, 1 tablet at 1 PM and 1 tablet at 7 PM   rosuvastatin (CRESTOR) 5 MG tablet Take 1 tablet (5 mg total) by mouth daily.   Trolamine Salicylate (ASPERCREME EX) Apply topically as needed.   valsartan (DIOVAN) 80 MG tablet Take 0.5 tablets (40 mg total) by mouth daily.   XARELTO 20 MG TABS tablet TAKE ONE TABLET BY MOUTH ONCE A DAY WITH THE EVENING MEAL.   No facility-administered encounter medications on file as of 07/29/2023.    PAST MEDICAL HISTORY: Past Medical History:  Diagnosis Date   Adenomatous colon polyp    Allergic rhinitis due to pollen    Anemia    Anxiety    Bronchiectasis (HCC)    Complication of anesthesia    Degenerative disc disease    cervical and lumbar   Dysrhythmia    GERD (gastroesophageal reflux disease)    Hearing loss in right ear    Heart murmur    HOH (hard of hearing)    AIDS   Hyperlipidemia    Hypertension    Osteoarthritis, multiple sites    PAF (paroxysmal atrial fibrillation) (HCC)    a. 01/2013 [facility]): EF 60-65%, no rwma, mildly dil LA/RA, mild AI/TR, trace MR, nl RV size/fxn, mild PAH; b. CHA2DS2VASc = 4-->Chronic Xarelto.   PMR (polymyalgia rheumatica) (HCC)    PONV (postoperative nausea and vomiting)    Rheumatic fever    Sleep apnea    NO CPAP   Urge incontinence    Vertigo    Vertigo     PAST SURGICAL HISTORY: Past Surgical History:  Procedure Laterality Date   ABDOMINAL HYSTERECTOMY     ANKLE FRACTURE  SURGERY Left 1996   BACK SURGERY     BREAST BIOPSY Left 2010   negative   CATARACT EXTRACTION W/PHACO Left 10/05/2018   Procedure: CATARACT EXTRACTION PHACO AND INTRAOCULAR LENS PLACEMENT (IOC);  Surgeon: Galen Manila, MD;  Location: [facility] ORS;  Service: Ophthalmology;  Laterality: Left;  [place] 00:40 CDE 6.92 fluid pack lot # [id number]      CATARACT EXTRACTION W/PHACO Right 10/26/2018   Procedure: CATARACT EXTRACTION PHACO AND INTRAOCULAR LENS PLACEMENT (IOC);  Surgeon: Galen Manila, MD;  Location: [facility] ORS;  Service: Ophthalmology;  Laterality: Right;  [place]  00:47 CDE 7.11 Fluid pack lot # [id number]   DEXA  11/09/06   normal   FRACTURE SURGERY     Meningitis  1963   SPINE SURGERY     SYNOVECTOMY Right 1973   elbow   TONSILLECTOMY AND ADENOIDECTOMY      ALLERGIES: Allergies  Allergen Reactions   Actonel [Risedronate Sodium] Nausea And Vomiting   Boniva [Ibandronic Acid] Nausea And Vomiting   Diclofenac Other (See Comments)    Voltaren Gel aggravated her sinus passages   Fosamax [Alendronate Sodium] Nausea And Vomiting   Lipitor [Atorvastatin] Other (See Comments)    Muscle aches. Tolerates Crestor.   Miacalcin [Calcitonin ([place])] Other (See Comments)    Sneezing    Myrbetriq [Mirabegron] Other (See Comments)    Gastritis   Sertraline Hcl Other (See Comments)    Irritable, etc   Talwin [Pentazocine] Other (See Comments)    Hallucinations    Latex Rash    Sneezing, watery eyes.   Niaspan [Niacin Er] Swelling and Rash    FAMILY HISTORY: Family History  Problem Relation Age of Onset   Schizophrenia Mother        paranoid   Heart disease Mother        from psych meds   Arthritis Mother    Rheum arthritis Mother    Cancer Father        prostate   Arthritis Father    Diabetes Father    Breast cancer Paternal Aunt 21   Ovarian cancer Maternal Grandmother    Uterine cancer Maternal Grandmother    Colon cancer Maternal Grandmother    Healthy Son     SOCIAL  HISTORY: Social History   Tobacco Use   Smoking status: Never    Passive exposure: Never   Smokeless tobacco: Never  Vaping Use   Vaping status: Never Used  Substance Use Topics   Alcohol use: Not Currently   Drug use: No   Social History   Social History Narrative   Retired 2007    1 son in [place] area   Spends part time in [place]      Has living will   Husband then son is health care POA--will need to be son with husband's decline   Not sure about DNR---will keep open resuscitation for now    No tube feedings if cognitively unaware   Right handed                Objective:  Vital Signs:  There were no vitals taken for this visit.  General:*** General appearance: Awake and alert. No distress. Cooperative with exam.  Skin: No obvious rash or jaundice. HEENT: Atraumatic. Anicteric. Lungs: Non-labored breathing on room air  Heart: Regular Abdomen: Soft, non tender. Extremities: No edema. No obvious deformity.  Musculoskeletal: No obvious joint swelling.  Neurological: Mental Status: Alert. Speech fluent. No pseudobulbar affect Cranial Nerves: CNII: No RAPD. Visual fields intact. CNIII, IV, VI: PERRL. No nystagmus. EOMI. CN V: Facial sensation intact bilaterally to fine touch. [place] clench strong. Jaw jerk***. CN VII: Facial muscles symmetric and strong. No ptosis at rest or after sustained upgaze***. CN VIII: Hears finger rub well bilaterally. CN  IX: No hypophonia. CN X: Palate elevates symmetrically. CN XI: Full strength shoulder shrug bilaterally. CN XII: Tongue protrusion full and midline. No atrophy or fasciculations. No significant dysarthria*** Motor: Tone is ***. *** fasciculations in *** extremities. *** atrophy. No grip or percussive myotonia.  Individual muscle group testing (MRC grade out of 5):  Movement     Neck flexion ***    Neck extension ***     Right Left   Shoulder abduction *** ***   Shoulder adduction *** ***   Shoulder  ext rotation *** ***   Shoulder int rotation *** ***   Elbow flexion *** ***   Elbow extension *** ***   Wrist extension *** ***   Wrist flexion *** ***   Finger abduction - FDI *** ***   Finger abduction - ADM *** ***   Finger extension *** ***   Finger distal flexion - 2/3 *** ***   Finger distal flexion - 4/5 *** ***   Thumb flexion - FPL *** ***   Thumb abduction - APB *** ***    Hip flexion *** ***   Hip extension *** ***   Hip adduction *** ***   Hip abduction *** ***   Knee extension *** ***   Knee flexion *** ***   Dorsiflexion *** ***   Plantarflexion *** ***   Inversion *** ***   Eversion *** ***   Great toe extension *** ***   Great toe flexion *** ***     Reflexes:  Right Left  Bicep *** ***  Tricep *** ***  BrRad *** ***  Knee *** ***  Ankle *** ***   Pathological Reflexes: Babinski: *** response bilaterally*** Hoffman: *** Troemner: *** Pectoral: *** Palmomental: *** Facial: *** Midline tap: *** Sensation: Pinprick: *** Vibration: *** Temperature: *** Proprioception: *** Coordination: Intact finger-to- nose-finger and heel-to-shin bilaterally. Romberg negative.*** Gait: Able to rise from chair with arms crossed unassisted. Normal, narrow-based gait. Able to tandem walk. Able to walk on toes and heels.***   Lab and Test Review: New results: CBC w/ diff (05/01/23): unremarkable  Previously reviewed results: 03/13/23: CMP: unremarkable TSH: 1.963   10/08/22: CBC and CMP wnl   08/27/22: Normal or unremarkable: CBC, CMP, TSH   08/15/22: Normal ESR and CRP   AChR binding ab: positive at 28.10   CT chest (05/15/22): FINDINGS: Cardiovascular: Moderate aortic atherosclerosis. No aneurysm. Coronary vascular calcification. Normal cardiac size. No pericardial effusion   Mediastinum/Nodes: Midline trachea. No thyroid mass. Subcarinal node measuring 12 mm. Esophagus is normal.   Lungs/Pleura: Mild bronchiectasis in the right middle lobe  and bilateral lower lobes with scarring. No acute airspace disease, pleural effusion or pneumothorax.   Upper Abdomen: No acute abnormality.   Musculoskeletal: No chest wall abnormality. No acute or significant osseous findings.   IMPRESSION: 1. Negative for mediastinal mass lesion. 2. Mild bronchiectasis in the right middle and bilateral lower lobes with pulmonary scarring  ASSESSMENT: This is Jennifer Schmitt, a 88 y.o. female with AChR ab positive generalized myasthenia gravis (symptom onset 12/2021 with diagnosis 04/28/2022). CT chest on 05/13/22 with no mediastinal mass.***  Plan: *** -Continue Cellcept 500 mg BID -Decrease prednisone?*** -Patient instructed to increase back to *** mg if MG symptoms worsen and call the office. -Mestinon 60 mg as needed -Continue staying active -Discussed MG crisis, what to watch for, and when to go to nearest ED including difficulty breathing or swallowing -Will check labs at next follow up (CBC w/ diff and CMP)  Return to clinic in ***6 months  Total time spent reviewing records, interview, history/exam, documentation, and coordination of care on day of encounter:  *** min  Jacquelyne Balint, MD

## 2023-07-29 ENCOUNTER — Ambulatory Visit: Payer: Medicare PPO | Admitting: Neurology

## 2023-07-29 ENCOUNTER — Telehealth: Payer: Self-pay | Admitting: Neurology

## 2023-07-29 NOTE — Progress Notes (Signed)
I saw Jennifer Schmitt in neurology clinic on 08/05/23 in follow up for AChR ab positive myasthenia gravis.  HPI: Jennifer Schmitt is a 88 y.o. year old female with a history of seropositive myasthenia gravis, polymyalgia rheumatica, HTN, HLD, pAfib, and OA who we last saw on 05/01/23.  To briefly review: Patient was previously seen in this office by Dr. Everlena Cooper (last seen on 06/05/22). Per initial clinic note on 05/08/22: Since January-February 2023, she has had progressive weakness.  She began having trouble rolling over in bed.  She was unable to get up and off her bed so she started sleeping on the couch..  Noted weakness in the left leg.  Seems to be dragging her right leg.  When standing, always with sensation that legs will give out. She has had falls and unable to stand up, requiring to call EMS.  Notes particular weakness in the proximal arms and trouble keeping her head up.  Started relying on a cane more frequently and then wheelchair.  She also endorsed worsening musculoskeletal pain with pain from the neck down her spine to her sacrum.  She does have degenerative spine disease and polymyalgia rheumatica.  MRI of brain with and without contrast on 04/27/2022 personally reviewed showed moderate chronic small vessel ischemic changes and known 1 x 2 mm vestibular schwannoma within the right internal auditory canal, stable compared to prior imaging in 2017.  She started noticing that her left eyelid was drooping.  She went to the ophthalmologist who suspected temporal arteritis given her history of PMR.  No vision loss.  Sed rate and CRP on 04/23/2022 were 101 and 10.1 respectively and was started on prednisone 60mg  daily with some improvement.  Based on strong likelihood of giant cell arteritis with elevated inflammatory markers and having PMR, temporal artery biopsy was not pursued.  She was started on prednisone 60mg  daily but discontinued after 3 days due to side effects.  She also has had increased  urinary urgency and sometimes fecal incontinence.  Due to sacral pain following a fall, she had X-ray of lumbar spine and MRI of sacrum which were negative for occult fracture.  AChR Binding Ab on 04/28/2022 was elevated at 28.10.  PCP started her on pyridostigmine 30mg  three times daily.  She notes improvement in strength in the legs.  However, she still has a head drop.  Denies double vision, trouble swallowing, trouble chewing, and trouble taking a breath.   At 05/08/22 visit, Dr. Everlena Cooper recommended prednisone 20 mg daily, mestinon 60 mg TID (7 am, 1 pm, 7 pm). CT chest on 05/15/22 was negative for mediastinal mass (no evidence of thymoma). Patient discontinued prednisone shortly after starting as it made her jittery and not able to sleep. At follow up with Dr. Everlena Cooper on 06/05/22, patient was not feeling well, but was walking a little better. She still had head drop. Hospital admission for a course of IVIg was suggested to prevent MG crisis, but patient declined as she needed to be home to take care of her husband with Alzheimer's disease. Cellcept 500 mg BID was started with IVIg every 28 days until Cellcept was therapeutic. She was also continued on mestinon. CBC and CMP were set up to be check weekly for 4 weeks, followed by monthly for 3 months. Patient called the next day though because she did not want IVIg. She decided to restart prednisone instead.   She felt much improved on 09/05/22. She is about 75% better than prior visit.  Patient lives at a retirement with healthcare and PT/OT. She has many resources there. Patient went to PT and OT. She is currently done with both. Husband is currently in a memory care unit twice a week.   Current medications: Cellcept 500 mg BID, Prednisone 20 mg once in the morning, Mestinon 60 mg TID (7:30-8am, 12pm, 8-9pm).   Side effects: Cellcept is difficult to take on a empty stomach. She gets a little nausea but this is moderately improved. She has hot flashes or  temperature fluctuations that are episodic. She is otherwise not having significant side effects.   MG symptoms on 09/05/22: Ptosis: Some drooping on left, pretty constant; improved compared to prior, worse when tired, not bothersome Double vision: Never Speech: Occasionally slurred speech, especially when tired; no one else commenting on it per patient Chewing: No problems currently Swallowing: Occasionally, episodic difficulty requiring a double swallow; no coughing Breathing: No problems currently Arm strength: No problems; Neck is still weak, but much improved. Cannot sit up straight for long periods of time Leg strength: Walks with a walker. She feels like her legs are strong. She has a walker due to not being able to stand up right.   At her visit on 09/05/22, we continued prednisone 20 mg daily, Cellcept 500 mg BID, but started tapering her mestinon (stopped on 09/11/22). I called the patient on 09/12/22 at which time she noted no change in her symptoms off mestinon. As a result, we agreed to begin tapering her prednisone. She decreased to 15 mg on 09/13/22.   At 02/12/23 visit, patient was doing well with minimal symptoms. She was on prednisone 10 mg daily Cellcept 500 mg BID and not taking mestinon. Prednisone was decreased to 7.5 mg daily on 02/12/23 (other medications kept the same). She mentioned neck and back pain and numbness in the 5th digit of bilateral hands at this visit.  At 05/01/23 visit, patient was doing well from an MG perspective. There was some confusion on whether she was currently on 5 mg or 7.5 mg of prednisone (supposed to be on 7.5 mg but thinks she was taking 5 mg).  Most recent Assessment and Plan (05/01/23): This is Jennifer Schmitt, a 88 y.o. female with AChR ab positive generalized myasthenia gravis (symptom onset 12/2021 with diagnosis 04/28/2022). CT chest on 05/13/22 with no mediastinal mass.    Plan: -Blood work: CBC w/ diff -Continue Cellcept 500 mg BID -Decrease  prednisone to 5 mg daily -Patient instructed to increase back to 10 mg if MG symptoms worsen and call the office. -Mestinon 60 mg as needed -Continue staying active -Discussed MG crisis, what to watch for, and when to go to nearest ED including difficulty breathing or swallowing   Since their last visit: Patient has noticed some increase in chronic headaches. She describes symptoms as a discomfort in her head into her neck that is usually present in the morning or when doing exercises. It will usually go away within 1 hour. She is not taking anything for this. She thinks tylenol is all she needs for this. She will get the headache maybe once a week.  She has some muscle stiffness at times as well. She has some pain in her back sometimes as well. She endorses poor posture that she is working on. She is doing home exercises given by PT and OT as well.  Current MG symptoms: Ptosis: Left eyelid can feel heavier at times Double vision: None Speech: None Chewing: None Swallowing: None Breathing:  None Arm strength: None Leg strength: None  She occasional tingling in hands and toes, but not bothersome.  Current medications:  Cellcept 500 mg BID Prednisone 5 mg daily Mestinon 60 mg TID (can take as needed, but unsure if she is taking)  Side effects: None   MEDICATIONS:  Outpatient Encounter Medications as of 08/05/2023  Medication Sig   acetaminophen (TYLENOL) 500 MG tablet Take 1,000 mg by mouth as needed.   amiodarone (PACERONE) 200 MG tablet Take 1 tablet (200 mg total) by mouth daily.   BD SYRINGE SLIP TIP 25G X 5/8" 1 ML MISC    calcium carbonate (CALCIUM 600) 600 MG TABS tablet Take 1 tablet (600 mg total) by mouth daily.   Cholecalciferol (VITAMIN D) 2000 units CAPS Take 2,000 Units by mouth daily.   cyanocobalamin (VITAMIN B12) 1000 MCG/ML injection INJECT 1 ML MONTHLY AS DIRECTED   diltiazem (CARDIZEM) 30 MG tablet Take 1 tablet (30 mg total) by mouth 3 (three) times daily as  needed (atrial fibrillation lasting more than 2 hours).   fluticasone (FLONASE) 50 MCG/ACT nasal spray Place 1 spray into both nostrils daily.   folic acid (FOLVITE) 1 MG tablet TAKE 1 TABLET BY MOUTH DAILY   guaiFENesin-codeine (ROBITUSSIN AC) 100-10 MG/5ML syrup Take 5 mLs by mouth 3 (three) times daily as needed for cough.   hydrocortisone 2.5 % cream Apply topically 3 (three) times daily as needed.   lansoprazole (PREVACID) 15 MG capsule TAKE ONE CAPSULE BY MOUTH DAILY AT 12 (NOON)   LORazepam (ATIVAN) 0.5 MG tablet TAKE ONE HALF TO 1 TABLET BY MOUTH TWICEDAILY AS NEEDED FOR ANXIETY   metoprolol succinate (TOPROL-XL) 25 MG 24 hr tablet TAKE ONE TABLET BY MOUTH DAILY   MIEBO 1.338 GM/ML SOLN Apply 1 drop to eye daily.   Multiple Vitamins-Minerals (PRESERVISION AREDS 2 PO) Take by mouth daily.   mycophenolate (CELLCEPT) 500 MG tablet Take 1 tablet (500 mg total) by mouth 2 (two) times daily.   propranolol (INDERAL) 10 MG tablet Take 1 tablet (10 mg total) by mouth 3 (three) times daily. daily as needed (for breakthrough atrial fibrillation)   pyridostigmine (MESTINON) 60 MG tablet Take 1 tablet at 7 AM, 1 tablet at 1 PM and 1 tablet at 7 PM   rosuvastatin (CRESTOR) 5 MG tablet Take 1 tablet (5 mg total) by mouth daily.   Trolamine Salicylate (ASPERCREME EX) Apply topically as needed.   valsartan (DIOVAN) 80 MG tablet Take 0.5 tablets (40 mg total) by mouth daily.   XARELTO 20 MG TABS tablet TAKE ONE TABLET BY MOUTH ONCE A DAY WITH THE EVENING MEAL.   [DISCONTINUED] predniSONE (DELTASONE) 5 MG tablet Take 1 tablet (5 mg total) by mouth daily with breakfast.   predniSONE (DELTASONE) 1 MG tablet Take 4 tablets (4 mg total) by mouth daily with breakfast.   No facility-administered encounter medications on file as of 08/05/2023.    PAST MEDICAL HISTORY: Past Medical History:  Diagnosis Date   Adenomatous colon polyp    Allergic rhinitis due to pollen    Anemia    Anxiety    Bronchiectasis  (HCC)    Complication of anesthesia    Degenerative disc disease    cervical and lumbar   Dysrhythmia    GERD (gastroesophageal reflux disease)    Hearing loss in right ear    Heart murmur    HOH (hard of hearing)    AIDS   Hyperlipidemia    Hypertension    Osteoarthritis,  multiple sites    PAF (paroxysmal atrial fibrillation) (HCC)    a. 01/2013 Echo [facility]): EF 60-65%, no rwma, mildly dil LA/RA, mild AI/TR, trace MR, nl RV size/fxn, mild PAH; b. CHA2DS2VASc = 4-->Chronic Xarelto.   PMR (polymyalgia rheumatica) (HCC)    PONV (postoperative nausea and vomiting)    Rheumatic fever    Sleep apnea    NO CPAP   Urge incontinence    Vertigo    Vertigo     PAST SURGICAL HISTORY: Past Surgical History:  Procedure Laterality Date   ABDOMINAL HYSTERECTOMY     ANKLE FRACTURE SURGERY Left 1996   BACK SURGERY     BREAST BIOPSY Left 2010   negative   CATARACT EXTRACTION W/PHACO Left 10/05/2018   Procedure: CATARACT EXTRACTION PHACO AND INTRAOCULAR LENS PLACEMENT (IOC);  Surgeon: Galen Manila, MD;  Location: [facility] ORS;  Service: Ophthalmology;  Laterality: Left;  [place] 00:40 CDE 6.92 fluid pack lot # [id number]      CATARACT EXTRACTION W/PHACO Right 10/26/2018   Procedure: CATARACT EXTRACTION PHACO AND INTRAOCULAR LENS PLACEMENT (IOC);  Surgeon: Galen Manila, MD;  Location: [facility] ORS;  Service: Ophthalmology;  Laterality: Right;  [place]  00:47 CDE 7.11 Fluid pack lot # [id number]   DEXA  11/09/06   normal   FRACTURE SURGERY     Meningitis  1963   SPINE SURGERY     SYNOVECTOMY Right 1973   elbow   TONSILLECTOMY AND ADENOIDECTOMY      ALLERGIES: Allergies  Allergen Reactions   Actonel [Risedronate Sodium] Nausea And Vomiting   Boniva [Ibandronic Acid] Nausea And Vomiting   Diclofenac Other (See Comments)    Voltaren Gel aggravated her sinus passages   Fosamax [Alendronate Sodium] Nausea And Vomiting   Lipitor [Atorvastatin] Other (See Comments)    Muscle aches.  Tolerates Crestor.   Miacalcin [Calcitonin ([place])] Other (See Comments)    Sneezing    Myrbetriq [Mirabegron] Other (See Comments)    Gastritis   Sertraline Hcl Other (See Comments)    Irritable, etc   Talwin [Pentazocine] Other (See Comments)    Hallucinations    Latex Rash    Sneezing, watery eyes.   Niaspan [Niacin Er] Swelling and Rash    FAMILY HISTORY: Family History  Problem Relation Age of Onset   Schizophrenia Mother        paranoid   Heart disease Mother        from psych meds   Arthritis Mother    Rheum arthritis Mother    Cancer Father        prostate   Arthritis Father    Diabetes Father    Breast cancer Paternal Aunt 58   Ovarian cancer Maternal Grandmother    Uterine cancer Maternal Grandmother    Colon cancer Maternal Grandmother    Healthy Son     SOCIAL HISTORY: Social History   Tobacco Use   Smoking status: Never    Passive exposure: Never   Smokeless tobacco: Never  Vaping Use   Vaping status: Never Used  Substance Use Topics   Alcohol use: Not Currently   Drug use: No   Social History   Social History Narrative   Retired 2007    1 son in [place] area   Spends part time in [place]      Has living will   Husband then son is health care POA--will need to be son with husband's decline   Not sure about DNR---will keep open resuscitation for now  No tube feedings if cognitively unaware   Right handed                Objective:  Vital Signs:  BP (!) 148/72   Pulse (!) 58   Ht 5\' 6"  (1.676 m)   Wt 157 lb (71.2 kg)   BMI 25.34 kg/m   General: General appearance: Awake and alert. No distress. Cooperative with exam.  Skin: No obvious rash or jaundice. HEENT: Atraumatic. Anicteric. Lungs: Non-labored breathing on room air  Heart: Regular Abdomen: Soft, non tender. Extremities: No edema. No obvious deformity.  Musculoskeletal: No obvious joint swelling.  Neurological: Mental Status: Alert. Speech fluent. No pseudobulbar  affect Cranial Nerves: CNII: No RAPD. Visual fields intact. CNIII, IV, VI: PERRL. No nystagmus. EOMI. CN V: Facial sensation intact bilaterally to fine touch. CN VII: Facial muscles symmetric and strong. No ptosis at rest. CN VIII: Hears finger rub well bilaterally. CN IX: No hypophonia. CN X: Palate elevates symmetrically. CN XI: Full strength shoulder shrug bilaterally. CN XII: Tongue protrusion full and midline. No atrophy or fasciculations. No significant dysarthria Motor: Tone is normal. Strength is 5/5 in bilateral upper and lower extremities. Reflexes:   Right Left  Bicep 2+ 2+  Tricep 2+ 2+  BrRad 2+ 2+  Knee 1+ 1+  Ankle 1+ 1+   Sensation: Pinprick: Intact in all extremities Coordination: Intact finger-to- nose-finger and heel-to-shin bilaterally. Gait: Walks with walker. Stooped posture. Slow, unsteady. Narrow based.   Lab and Test Review: New results: CBC w/ diff (05/01/23): unremarkable  Previously reviewed results: 03/13/23: CMP: unremarkable TSH: 1.963   10/08/22: CBC and CMP wnl   08/27/22: Normal or unremarkable: CBC, CMP, TSH   08/15/22: Normal ESR and CRP   AChR binding ab: positive at 28.10   CT chest (05/15/22): FINDINGS: Cardiovascular: Moderate aortic atherosclerosis. No aneurysm. Coronary vascular calcification. Normal cardiac size. No pericardial effusion   Mediastinum/Nodes: Midline trachea. No thyroid mass. Subcarinal node measuring 12 mm. Esophagus is normal.   Lungs/Pleura: Mild bronchiectasis in the right middle lobe and bilateral lower lobes with scarring. No acute airspace disease, pleural effusion or pneumothorax.   Upper Abdomen: No acute abnormality.   Musculoskeletal: No chest wall abnormality. No acute or significant osseous findings.   IMPRESSION: 1. Negative for mediastinal mass lesion. 2. Mild bronchiectasis in the right middle and bilateral lower lobes with pulmonary scarring  ASSESSMENT: This is Jennifer Schmitt, a  88 y.o. female with AChR ab positive generalized myasthenia gravis (symptom onset 12/2021 with diagnosis 04/28/2022). CT chest on 05/13/22 with no mediastinal mass. She currently has minimal MG symptoms.  Plan: -Continue Cellcept 500 mg BID -Decrease prednisone to 4 mg -Patient instructed to increase back to 5 mg if MG symptoms worsen and call the office. -Mestinon 60 mg as needed -Continue staying active -Discussed MG crisis, what to watch for, and when to go to nearest ED including difficulty breathing or swallowing -Will check labs at next follow up (CBC w/ diff and CMP)  Return to clinic in 6 months  Total time spent reviewing records, interview, history/exam, documentation, and coordination of care on day of encounter:  30 min  Jacquelyne Balint, MD

## 2023-07-29 NOTE — Telephone Encounter (Deleted)
The number for Jennifer Schmitt [phone] per Luster Landsberg'

## 2023-07-29 NOTE — Telephone Encounter (Addendum)
FYI: Pt was called to get her rescheduled from a appointment that missed on 07/29/2023 and she informed me that she is not able to schedule any of her appointments it is handled by Jess Barters and she also has to find someone to take care of her husband while she is away.  Per Luster Landsberg' the Social Workers Jess Barters number is [phone].

## 2023-07-29 NOTE — Telephone Encounter (Signed)
Social Worker Jess Barters @ [phone] was called and lmom for her to call and get the pt r/s'd.

## 2023-08-05 ENCOUNTER — Encounter: Payer: Self-pay | Admitting: Neurology

## 2023-08-05 ENCOUNTER — Ambulatory Visit: Payer: Medicare PPO | Admitting: Neurology

## 2023-08-05 VITALS — BP 148/72 | HR 58 | Ht 66.0 in | Wt 157.0 lb

## 2023-08-05 DIAGNOSIS — G7000 Myasthenia gravis without (acute) exacerbation: Secondary | ICD-10-CM | POA: Diagnosis not present

## 2023-08-05 DIAGNOSIS — M5382 Other specified dorsopathies, cervical region: Secondary | ICD-10-CM

## 2023-08-05 DIAGNOSIS — M6281 Muscle weakness (generalized): Secondary | ICD-10-CM | POA: Diagnosis not present

## 2023-08-05 DIAGNOSIS — G8929 Other chronic pain: Secondary | ICD-10-CM

## 2023-08-05 DIAGNOSIS — R269 Unspecified abnormalities of gait and mobility: Secondary | ICD-10-CM | POA: Diagnosis not present

## 2023-08-05 DIAGNOSIS — H02403 Unspecified ptosis of bilateral eyelids: Secondary | ICD-10-CM

## 2023-08-05 DIAGNOSIS — M545 Low back pain, unspecified: Secondary | ICD-10-CM | POA: Diagnosis not present

## 2023-08-05 DIAGNOSIS — M542 Cervicalgia: Secondary | ICD-10-CM

## 2023-08-05 MED ORDER — PREDNISONE 1 MG PO TABS: 4.0000 mg | ORAL_TABLET | Freq: Every day | ORAL | 5 refills | Status: DC

## 2023-08-05 NOTE — Patient Instructions (Addendum)
MG medications: -Continue Cellcept 500 mg twice daily -Decrease prednisone to 4 mg daily (4 tablets of 1 mg daily) -Please go back to 5 mg if MG symptoms worsen and call the office. -Mestinon 60 mg as needed up to three times a day - you do not need to take this if you are not having symptoms  I will see you again in 6 months. Please let me know if you have any questions or concerns in the meantime.   The physicians and staff at [facility] are committed to providing excellent care. You may receive a survey requesting feedback about your experience at our office. We strive to receive "very good" responses to the survey questions. If you feel that your experience would prevent you from giving the office a "very good " response, please contact our office to try to remedy the situation. We may be reached at [phone]. Thank you for taking the time out of your busy day to complete the survey.  Jacquelyne Balint, MD [facility]  Preventing Falls at [facility] are common, often dreaded events in the lives of older people. Aside from the obvious injuries and even death that may result, fall can cause wide-ranging consequences including loss of independence, mental decline, decreased activity and mobility. Younger people are also at risk of falling, especially those with chronic illnesses and fatigue.  Ways to reduce risk for falling Examine diet and medications. Warm foods and alcohol dilate blood vessels, which can lead to dizziness when standing. Sleep aids, antidepressants and pain medications can also increase the likelihood of a fall.  Get a vision exam. Poor vision, cataracts and glaucoma increase the chances of falling.  Check foot gear. Shoes should fit snugly and have a sturdy, nonskid sole and a broad, low heel  Participate in a physician-approved exercise program to build and maintain muscle strength and improve balance and coordination. Programs that use ankle weights or  stretch bands are excellent for muscle-strengthening. Water aerobics programs and low-impact Tai Chi programs have also been shown to improve balance and coordination.  Increase vitamin D intake. Vitamin D improves muscle strength and increases the amount of calcium the body is able to absorb and deposit in bones.  How to prevent falls from common hazards Floors - Remove all loose wires, cords, and throw rugs. Minimize clutter. Make sure rugs are anchored and smooth. Keep furniture in its usual place.  Chairs -- Use chairs with straight backs, armrests and firm seats. Add firm cushions to existing pieces to add height.  Bathroom - Install grab bars and non-skid tape in the tub or shower. Use a bathtub transfer bench or a shower chair with a back support Use an elevated toilet seat and/or safety rails to assist standing from a low surface. Do not use towel racks or bathroom tissue holders to help you stand.  Lighting - Make sure halls, stairways, and entrances are well-lit. Install a night light in your bathroom or hallway. Make sure there is a light switch at the top and bottom of the staircase. Turn lights on if you get up in the middle of the night. Make sure lamps or light switches are within reach of the bed if you have to get up during the night.  Kitchen - Install non-skid rubber mats near the sink and stove. Clean spills immediately. Store frequently used utensils, pots, pans between waist and eye level. This helps prevent reaching and bending. Sit when getting things out of lower cupboards.  Living room/ Bedrooms - Place furniture with wide spaces in between, giving enough room to move around. Establish a route through the living room that gives you something to hold onto as you walk.  Stairs - Make sure treads, rails, and rugs are secure. Install a rail on both sides of the stairs. If stairs are a threat, it might be helpful to arrange most of your activities on the lower level to reduce  the number of times you must climb the stairs.  Entrances and doorways - Install metal handles on the walls adjacent to the doorknobs of all doors to make it more secure as you travel through the doorway.  Tips for maintaining balance Keep at least one hand free at all times. Try using a backpack or fanny pack to hold things rather than carrying them in your hands. Never carry objects in both hands when walking as this interferes with keeping your balance.  Attempt to swing both arms from front to back while walking. This might require a conscious effort if Parkinson's disease has diminished your movement. It will, however, help you to maintain balance and posture, and reduce fatigue.  Consciously lift your feet off of the ground when walking. Shuffling and dragging of the feet is a common culprit in losing your balance.  When trying to navigate turns, use a "U" technique of facing forward and making a wide turn, rather than pivoting sharply.  Try to stand with your feet shoulder-length apart. When your feet are close together for any length of time, you increase your risk of losing your balance and falling.  Do one thing at a time. Don't try to walk and accomplish another task, such as reading or looking around. The decrease in your automatic reflexes complicates motor function, so the less distraction, the better.  Do not wear rubber or gripping soled shoes, they might "catch" on the floor and cause tripping.  Move slowly when changing positions. Use deliberate, concentrated movements and, if needed, use a grab bar or walking aid. Count 15 seconds between each movement. For example, when rising from a seated position, wait 15 seconds after standing to begin walking.  If balance is a continuous problem, you might want to consider a walking aid such as a cane, walking stick, or walker. Once you've mastered walking with help, you might be ready to try it on your own again.

## 2023-08-06 DIAGNOSIS — G7000 Myasthenia gravis without (acute) exacerbation: Secondary | ICD-10-CM | POA: Diagnosis not present

## 2023-08-06 DIAGNOSIS — H538 Other visual disturbances: Secondary | ICD-10-CM | POA: Diagnosis not present

## 2023-08-06 DIAGNOSIS — H26493 Other secondary cataract, bilateral: Secondary | ICD-10-CM | POA: Diagnosis not present

## 2023-08-06 DIAGNOSIS — M3501 Sicca syndrome with keratoconjunctivitis: Secondary | ICD-10-CM | POA: Diagnosis not present

## 2023-08-10 ENCOUNTER — Other Ambulatory Visit: Payer: Self-pay | Admitting: Cardiology

## 2023-08-17 ENCOUNTER — Ambulatory Visit: Payer: Medicare PPO | Admitting: Podiatry

## 2023-08-17 ENCOUNTER — Encounter: Payer: Self-pay | Admitting: Podiatry

## 2023-08-17 DIAGNOSIS — Z91199 Patient's noncompliance with other medical treatment and regimen due to unspecified reason: Secondary | ICD-10-CM

## 2023-08-23 NOTE — Progress Notes (Signed)
Appointment canceled.

## 2023-08-28 ENCOUNTER — Other Ambulatory Visit: Payer: Self-pay | Admitting: Neurology

## 2023-08-28 DIAGNOSIS — G7000 Myasthenia gravis without (acute) exacerbation: Secondary | ICD-10-CM

## 2023-09-02 ENCOUNTER — Other Ambulatory Visit: Payer: Self-pay

## 2023-09-02 DIAGNOSIS — G7000 Myasthenia gravis without (acute) exacerbation: Secondary | ICD-10-CM

## 2023-09-02 MED ORDER — MYCOPHENOLATE MOFETIL 500 MG PO TABS: 500.0000 mg | ORAL_TABLET | Freq: Two times a day (BID) | ORAL | 11 refills | Status: DC

## 2023-09-04 ENCOUNTER — Other Ambulatory Visit: Payer: Self-pay | Admitting: Neurology

## 2023-09-08 ENCOUNTER — Other Ambulatory Visit: Payer: Self-pay | Admitting: Cardiovascular Disease

## 2023-09-10 NOTE — Progress Notes (Signed)
Cardiology Office Note Date:  09/11/2023  Patient ID:  Jennifer Schmitt, Jennifer Schmitt 07-10-1935, MRN [id number] PCP:  Karie Schwalbe, MD  Cardiologist:  None Electrophysiologist: Lanier Prude, MD    Chief Complaint: afib follow-up  History of Present Illness: Jennifer Schmitt is a 88 y.o. female with PMH notable for persis afib, PVCs, HTN, myasthenia gravis; seen today for Lanier Prude, MD for routine electrophysiology followup.  She last saw Dr. Lalla Schmitt 02/2023, maintaining NSR on amiodarone. Improvement in PVCs. Not ablation candidate d/t myasthenia gravis.  On follow-up today, she has had no recent AFib episodes, thinks it has been about a year or more since last episode. Can not remember the last time she used PRN dilt or PRN propranolol. She walks with a walker d/t muscle weakness related to MG, but is stable. Continues to take xarelto daily, no missed doses. No bleeding concerns.  She has some fatigue with walking longer distances, specifically remembers when she was walking from our clinic to the hospital to obtain labwork - just felt exhausted after walking. This has not acutely worsened, and is stable. No SOB, no chest pain or pressure with walking.    .  she denies chest pain, palpitations, dyspnea, PND, orthopnea, nausea, vomiting, dizziness, syncope, edema, weight gain, or early satiety.     AAD History: Amiodarone   Past Medical History:  Diagnosis Date   Adenomatous colon polyp    Allergic rhinitis due to pollen    Anemia    Anxiety    Bronchiectasis (HCC)    Complication of anesthesia    Degenerative disc disease    cervical and lumbar   Dysrhythmia    GERD (gastroesophageal reflux disease)    Hearing loss in right ear    Heart murmur    HOH (hard of hearing)    AIDS   Hyperlipidemia    Hypertension    Osteoarthritis, multiple sites    PAF (paroxysmal atrial fibrillation) (HCC)    a. 01/2013 Echo [facility]): EF 60-65%, no rwma, mildly dil LA/RA,  mild AI/TR, trace MR, nl RV size/fxn, mild PAH; b. CHA2DS2VASc = 4-->Chronic Xarelto.   PMR (polymyalgia rheumatica) (HCC)    PONV (postoperative nausea and vomiting)    Rheumatic fever    Sleep apnea    NO CPAP   Urge incontinence    Vertigo    Vertigo     Past Surgical History:  Procedure Laterality Date   ABDOMINAL HYSTERECTOMY     ANKLE FRACTURE SURGERY Left 1996   BACK SURGERY     BREAST BIOPSY Left 2010   negative   CATARACT EXTRACTION W/PHACO Left 10/05/2018   Procedure: CATARACT EXTRACTION PHACO AND INTRAOCULAR LENS PLACEMENT (IOC);  Surgeon: Galen Manila, MD;  Location: [facility] ORS;  Service: Ophthalmology;  Laterality: Left;  [place] 00:40 CDE 6.92 fluid pack lot # [id number]      CATARACT EXTRACTION W/PHACO Right 10/26/2018   Procedure: CATARACT EXTRACTION PHACO AND INTRAOCULAR LENS PLACEMENT (IOC);  Surgeon: Galen Manila, MD;  Location: [facility] ORS;  Service: Ophthalmology;  Laterality: Right;  [place]  00:47 CDE 7.11 Fluid pack lot # [id number]   DEXA  11/09/06   normal   FRACTURE SURGERY     Meningitis  1963   SPINE SURGERY     SYNOVECTOMY Right 1973   elbow   TONSILLECTOMY AND ADENOIDECTOMY      Current Outpatient Medications  Medication Instructions   acetaminophen (TYLENOL) 1,000 mg, Oral, As needed   amiodarone (  PACERONE) 200 MG tablet TAKE ONE TABLET (200 MG TOTAL) BY MOUTH DAILY.   BD SYRINGE SLIP TIP 25G X 5/8" 1 ML MISC    calcium carbonate (CALCIUM 600) 600 mg, Oral, Daily   cyanocobalamin (VITAMIN B12) 1000 MCG/ML injection INJECT 1 ML MONTHLY AS DIRECTED   diltiazem (CARDIZEM) 30 mg, Oral, 3 times daily PRN   fluticasone (FLONASE) 50 MCG/ACT nasal spray 1 spray, Each Nare, Daily   folic acid (FOLVITE) 1 mg, Oral, Daily   guaiFENesin-codeine (ROBITUSSIN AC) 100-10 MG/5ML syrup 5 mLs, Oral, 3 times daily PRN   hydrocortisone 2.5 % cream Topical, 3 times daily PRN   lansoprazole (PREVACID) 15 MG capsule TAKE ONE CAPSULE BY MOUTH DAILY AT 12 (NOON)    LORazepam (ATIVAN) 0.5 MG tablet TAKE ONE HALF TO 1 TABLET BY MOUTH TWICEDAILY AS NEEDED FOR ANXIETY   metoprolol succinate (TOPROL-XL) 25 mg, Oral, Daily   MIEBO 1.338 GM/ML SOLN 1 drop, Ophthalmic, Daily   Multiple Vitamins-Minerals (PRESERVISION AREDS 2 PO) Oral, Daily   mycophenolate (CELLCEPT) 500 mg, Oral, 2 times daily   predniSONE (DELTASONE) 4 mg, Oral, Daily with breakfast   propranolol (INDERAL) 10 mg, Oral, 3 times daily, daily as needed (for breakthrough atrial fibrillation)   pyridostigmine (MESTINON) 60 MG tablet Take 1 tablet at 7 AM, 1 tablet at 1 PM and 1 tablet at 7 PM   rosuvastatin (CRESTOR) 5 mg, Oral, Daily   Trolamine Salicylate (ASPERCREME EX) Apply externally, As needed   valsartan (DIOVAN) 80 MG tablet TAKE HALF A TABLET BY MOUTH DAILY   Vitamin D 2,000 Units, Oral, Daily   XARELTO 20 MG TABS tablet TAKE ONE TABLET BY MOUTH ONCE A DAY WITH THE EVENING MEAL.    Social History:  The patient  reports that she has never smoked. She has never been exposed to tobacco smoke. She has never used smokeless tobacco. She reports that she does not currently use alcohol. She reports that she does not use drugs.   Family History:  The patient's family history includes Arthritis in her father and mother; Breast cancer (age of onset: 71) in her paternal aunt; Cancer in her father; Colon cancer in her maternal grandmother; Diabetes in her father; Healthy in her son; Heart disease in her mother; Ovarian cancer in her maternal grandmother; Rheum arthritis in her mother; Schizophrenia in her mother; Uterine cancer in her maternal grandmother.  ROS:  Please see the history of present illness. All other systems are reviewed and otherwise negative.   PHYSICAL EXAM:  VS:  BP 124/60 (BP Location: Left Arm, Patient Position: Sitting, Cuff Size: Normal)   Pulse (!) 51   Ht 5\' 7"  (1.702 m)   Wt 158 lb 8 oz (71.9 kg)   SpO2 97%   BMI 24.82 kg/m  BMI: Body mass index is 24.82 kg/m.  GEN-  The patient is well appearing, alert and oriented x 3 today.   Lungs- Clear to ausculation bilaterally, normal work of breathing.  Heart- Regular, bradycardic rate and rhythm, no murmurs, rubs or gallops Extremities- Trace peripheral edema, warm, dry   EKG is ordered. Personal review of EKG from today shows:    EKG Interpretation Date/Time:  Friday September 11 2023 11:00:05 EDT Ventricular Rate:  51 PR Interval:  238 QRS Duration:  102 QT Interval:  450 QTC Calculation: 414 R Axis:   7  Text Interpretation: Sinus bradycardia with First degree heart block When compared with ECG of 31-May-2015 10:51, QT has shortened Confirmed by  Sherie Don ([id number]) on 09/11/2023 11:04:10 AM    Recent Labs: 03/13/2023: ALT 24; BUN 19; Creatinine, Ser 0.78; Potassium 3.9; Sodium 139; TSH 1.963 05/01/2023: Hemoglobin 12.0; Platelets 247  No results found for requested labs within last 365 days.   CrCl cannot be calculated (Patient's most recent lab result is older than the maximum 21 days allowed.).   Wt Readings from Last 3 Encounters:  09/11/23 158 lb 8 oz (71.9 kg)  08/05/23 157 lb (71.2 kg)  07/09/23 157 lb (71.2 kg)     Additional studies reviewed include: Previous EP, cardiology notes.   TTE, 04/03/2022  1. Left ventricular ejection fraction, by estimation, is 55 to 60%. The left ventricle has normal function. The left ventricle has no regional wall motion abnormalities. Left ventricular diastolic parameters are consistent with Grade I diastolic dysfunction (impaired relaxation). The average left ventricular global longitudinal strain is -18.4 %.   2. Right ventricular systolic function is normal. The right ventricular size is normal. There is mildly elevated pulmonary artery systolic pressure. The estimated right ventricular systolic pressure is 42.7 mmHg.   3. The mitral valve is normal in structure. No evidence of mitral valve regurgitation. No evidence of mitral stenosis.   4. The aortic  valve is tricuspid. Aortic valve regurgitation is mild. Aortic valve sclerosis/calcification is present, without any evidence of aortic stenosis.   5. The inferior vena cava is normal in size with greater than 50% respiratory variability, suggesting right atrial pressure of 3 mmHg.   6. PVCs noted   Long term monitor, 04/01/2022 Patch Wear Time:  3 days and 0 hours (2023-03-30T17:20:10-399 to 2023-04-02T17:33:05-399)   Normal sinus rhythm Patient had a min HR of 49 bpm, max HR of 164 bpm, and avg HR of 73 bpm   3 Supraventricular Tachycardia runs occurred, the run with the fastest interval lasting 5 beats with a max rate of 164 bpm, the longest lasting 5 beats with an avg rate of 114 bpm.   Isolated SVEs were rare (<1.0%), SVE Couplets were rare (<1.0%), and SVE Triplets were rare (<1.0%).    Isolated VEs were frequent (22.5%, 71540), VE Couplets were rare (<1.0%, 165), and VE Triplets were rare (<1.0%, 2). Ventricular Bigeminy and Trigeminy were present.    Triggered events associated with normal sinus rhythm with frequent PVCs    ASSESSMENT AND PLAN:  #) persis Afib #) PVCs #) bradycardia #) fatigue with walking No recent symptomatic episodes Patient has had long-standing bradycardia looking at previous health care encounters Decrease amiodarone to 200mg  every other day to see if pulse rises and has improved fatigue with walking.  Continue 25mg  toprol daily Update amio labs today  #) Hypercoag d/t persis afib CHA2DS2-VASc Score = 4 [CHF History: 0, HTN History: 1, Diabetes History: 0, Stroke History: 0, Vascular Disease History: 0, Age Score: 2, Gender Score: 1].  Therefore, the patient's annual risk of stroke is 4.8 %.    Stroke ppx - 20mg  Xarelto daily, appropriately dosed for CrCl > 69mL/min No bleeding concerns  #) HTN Well-controlled in office today Continue 25mg  toprol Continue 80mg  valsartan      Current medicines are reviewed at length with the patient today.    The patient does not have concerns regarding her medicines.  The following changes were made today:   REDUCE amiodarone to 200mg  every other day  Labs/ tests ordered today include:  Orders Placed This Encounter  Procedures   EKG 12-Lead     Disposition: Follow up with  Dr. Lalla Schmitt or EP APP in 6 months   Signed, Sherie Don, NP  09/11/23  11:06 AM  Electrophysiology [facility]

## 2023-09-11 ENCOUNTER — Ambulatory Visit: Payer: Medicare PPO | Attending: Cardiology | Admitting: Cardiology

## 2023-09-11 ENCOUNTER — Encounter: Payer: Self-pay | Admitting: Cardiology

## 2023-09-11 VITALS — BP 124/60 | HR 51 | Ht 67.0 in | Wt 158.5 lb

## 2023-09-11 DIAGNOSIS — D6869 Other thrombophilia: Secondary | ICD-10-CM | POA: Diagnosis not present

## 2023-09-11 DIAGNOSIS — I493 Ventricular premature depolarization: Secondary | ICD-10-CM

## 2023-09-11 DIAGNOSIS — Z79899 Other long term (current) drug therapy: Secondary | ICD-10-CM | POA: Diagnosis not present

## 2023-09-11 DIAGNOSIS — G4733 Obstructive sleep apnea (adult) (pediatric): Secondary | ICD-10-CM

## 2023-09-11 DIAGNOSIS — I4819 Other persistent atrial fibrillation: Secondary | ICD-10-CM | POA: Diagnosis not present

## 2023-09-11 MED ORDER — AMIODARONE HCL 200 MG PO TABS: 200.0000 mg | ORAL_TABLET | ORAL | 0 refills | Status: DC

## 2023-09-11 NOTE — Patient Instructions (Signed)
Medication Instructions:  Decrease Amiodarone to 200 mg every other day   *If you need a refill on your cardiac medications before your next appointment, please call your pharmacy*   Lab Work: Your provider would like for you to have following labs drawn today CMET, MAG, TSH, FREE T4.   If you have labs (blood work) drawn today and your tests are completely normal, you will receive your results only by: MyChart Message (if you have MyChart) OR A paper copy in the mail If you have any lab test that is abnormal or we need to change your treatment, we will call you to review the results.   Follow-Up: At [facility], you and your health needs are our priority.  As part of our continuing mission to provide you with exceptional heart care, we have created designated Provider Care Teams.  These Care Teams include your primary Cardiologist (physician) and Advanced Practice Providers (APPs -  Physician Assistants and Nurse Practitioners) who all work together to provide you with the care you need, when you need it.  We recommend signing up for the patient portal called "MyChart".  Sign up information is provided on this After Visit Summary.  MyChart is used to connect with patients for Virtual Visits (Telemedicine).  Patients are able to view lab/test results, encounter notes, upcoming appointments, etc.  Non-urgent messages can be sent to your provider as well.   To learn more about what you can do with MyChart, go to [email or web address].    Your next appointment:   6 month(s)  Provider:   Steffanie Dunn, MD or Sherie Don, NP

## 2023-09-12 LAB — MAGNESIUM: Magnesium: 2.2 mg/dL (ref 1.6–2.3)

## 2023-09-12 LAB — COMPREHENSIVE METABOLIC PANEL
ALT: 16 IU/L (ref 0–32)
AST: 18 IU/L (ref 0–40)
Albumin: 4 g/dL (ref 3.7–4.7)
Alkaline Phosphatase: 50 IU/L (ref 44–121)
BUN/Creatinine Ratio: 21 (ref 12–28)
BUN: 18 mg/dL (ref 8–27)
Bilirubin Total: 0.3 mg/dL (ref 0.0–1.2)
CO2: 24 mmol/L (ref 20–29)
Calcium: 8.7 mg/dL (ref 8.7–10.3)
Chloride: 103 mmol/L (ref 96–106)
Creatinine, Ser: 0.85 mg/dL (ref 0.57–1.00)
Globulin, Total: 2.1 g/dL (ref 1.5–4.5)
Glucose: 83 mg/dL (ref 70–99)
Potassium: 5 mmol/L (ref 3.5–5.2)
Sodium: 139 mmol/L (ref 134–144)
Total Protein: 6.1 g/dL (ref 6.0–8.5)
eGFR: 67 mL/min/{1.73_m2} (ref >59)

## 2023-09-12 LAB — TSH: TSH: 1.96 u[IU]/mL (ref 0.450–4.500)

## 2023-09-12 LAB — T4, FREE: Free T4: 1.39 ng/dL (ref 0.82–1.77)

## 2023-09-16 DIAGNOSIS — H26491 Other secondary cataract, right eye: Secondary | ICD-10-CM | POA: Diagnosis not present

## 2023-09-24 ENCOUNTER — Ambulatory Visit: Payer: Medicare PPO | Admitting: Podiatry

## 2023-10-01 DIAGNOSIS — H26492 Other secondary cataract, left eye: Secondary | ICD-10-CM | POA: Diagnosis not present

## 2023-10-08 DIAGNOSIS — L570 Actinic keratosis: Secondary | ICD-10-CM | POA: Diagnosis not present

## 2023-10-08 DIAGNOSIS — L308 Other specified dermatitis: Secondary | ICD-10-CM | POA: Diagnosis not present

## 2023-10-14 DIAGNOSIS — J309 Allergic rhinitis, unspecified: Secondary | ICD-10-CM | POA: Diagnosis not present

## 2023-10-14 DIAGNOSIS — H6123 Impacted cerumen, bilateral: Secondary | ICD-10-CM | POA: Diagnosis not present

## 2023-10-14 DIAGNOSIS — G7000 Myasthenia gravis without (acute) exacerbation: Secondary | ICD-10-CM | POA: Diagnosis not present

## 2023-10-14 DIAGNOSIS — J300 Vasomotor rhinitis: Secondary | ICD-10-CM | POA: Diagnosis not present

## 2023-11-02 ENCOUNTER — Other Ambulatory Visit: Payer: Self-pay | Admitting: Cardiology

## 2023-11-16 ENCOUNTER — Encounter: Payer: Self-pay | Admitting: Internal Medicine

## 2023-11-16 ENCOUNTER — Ambulatory Visit: Payer: Medicare PPO | Admitting: Internal Medicine

## 2023-11-16 VITALS — BP 130/80 | HR 61 | Temp 99.0°F | Ht 67.0 in | Wt 158.0 lb

## 2023-11-16 DIAGNOSIS — R1011 Right upper quadrant pain: Secondary | ICD-10-CM | POA: Insufficient documentation

## 2023-11-16 MED ORDER — ONDANSETRON HCL 4 MG PO TABS: 4.0000 mg | ORAL_TABLET | Freq: Three times a day (TID) | ORAL | 0 refills | Status: AC | PRN

## 2023-11-16 NOTE — Assessment & Plan Note (Signed)
Tenderness is in several different areas but her pain report is RUQ Will give ondansetron for prn use Check ultrasound to rule out gallbladder disease

## 2023-11-16 NOTE — Progress Notes (Signed)
Subjective:    Patient ID: Jennifer Schmitt, female    DOB: [birth year], 88 y.o.   MRN: [id number]  HPI Here due to abdominal pain  For 2-3 months --will have soreness under right ribs and nausea Not clearly related to eating --generally low fat diet Then had [place] stew and got really sick---nausea but couldn't throw up Hasn't felt great since then  No fever--but will feel drained Bowels move okay  Current Outpatient Medications on File Prior to Visit  Medication Sig Dispense Refill   acetaminophen (TYLENOL) 500 MG tablet Take 1,000 mg by mouth as needed.     amiodarone (PACERONE) 200 MG tablet Take 1 tablet (200 mg total) by mouth every other day. 90 tablet 0   BD SYRINGE SLIP TIP 25G X 5/8" 1 ML MISC      calcium carbonate (CALCIUM 600) 600 MG TABS tablet Take 1 tablet (600 mg total) by mouth daily. 30 tablet 0   Cholecalciferol (VITAMIN D) 2000 units CAPS Take 2,000 Units by mouth daily.     cyanocobalamin (VITAMIN B12) 1000 MCG/ML injection INJECT 1 ML MONTHLY AS DIRECTED 1 mL 11   fluticasone (FLONASE) 50 MCG/ACT nasal spray Place 1 spray into both nostrils daily.     folic acid (FOLVITE) 1 MG tablet TAKE 1 TABLET BY MOUTH DAILY 90 tablet 3   guaiFENesin-codeine (ROBITUSSIN AC) 100-10 MG/5ML syrup Take 5 mLs by mouth 3 (three) times daily as needed for cough. 120 mL 0   hydrocortisone 2.5 % cream Apply topically 3 (three) times daily as needed. 28 g 3   lansoprazole (PREVACID) 15 MG capsule TAKE ONE CAPSULE BY MOUTH DAILY AT 12 (NOON) 30 capsule 2   LORazepam (ATIVAN) 0.5 MG tablet TAKE ONE HALF TO 1 TABLET BY MOUTH TWICEDAILY AS NEEDED FOR ANXIETY 30 tablet 0   metoprolol succinate (TOPROL-XL) 25 MG 24 hr tablet TAKE ONE TABLET BY MOUTH DAILY 90 tablet 3   MIEBO 1.338 GM/ML SOLN Apply 1 drop to eye daily.     Multiple Vitamins-Minerals (PRESERVISION AREDS 2 PO) Take by mouth daily.     mycophenolate (CELLCEPT) 500 MG tablet Take 1 tablet (500 mg total) by mouth 2 (two)  times daily. 60 tablet 11   predniSONE (DELTASONE) 1 MG tablet Take 4 tablets (4 mg total) by mouth daily with breakfast. (Patient taking differently: Take 3 mg by mouth daily with breakfast.) 120 tablet 5   propranolol (INDERAL) 10 MG tablet Take 1 tablet (10 mg total) by mouth 3 (three) times daily. daily as needed (for breakthrough atrial fibrillation) 60 tablet 1   pyridostigmine (MESTINON) 60 MG tablet Take 1 tablet at 7 AM, 1 tablet at 1 PM and 1 tablet at 7 PM 90 tablet 5   rosuvastatin (CRESTOR) 5 MG tablet Take 1 tablet (5 mg total) by mouth daily. 90 tablet 3   Trolamine Salicylate (ASPERCREME EX) Apply topically as needed.     valsartan (DIOVAN) 80 MG tablet TAKE HALF A TABLET BY MOUTH DAILY 135 tablet 0   XARELTO 20 MG TABS tablet TAKE ONE TABLET BY MOUTH ONCE A DAY WITH THE EVENING MEAL. 90 tablet 1   diltiazem (CARDIZEM) 30 MG tablet Take 1 tablet (30 mg total) by mouth 3 (three) times daily as needed (atrial fibrillation lasting more than 2 hours). (Patient not taking: Reported on 11/16/2023) 60 tablet 3   No current facility-administered medications on file prior to visit.    Allergies  Allergen Reactions   Actonel [  Risedronate Sodium] Nausea And Vomiting   Boniva [Ibandronic Acid] Nausea And Vomiting   Diclofenac Other (See Comments)    Voltaren Gel aggravated her sinus passages   Fosamax [Alendronate Sodium] Nausea And Vomiting   Lipitor [Atorvastatin] Other (See Comments)    Muscle aches. Tolerates Crestor.   Miacalcin [Calcitonin ([place])] Other (See Comments)    Sneezing    Myrbetriq [Mirabegron] Other (See Comments)    Gastritis   Sertraline Hcl Other (See Comments)    Irritable, etc   Talwin [Pentazocine] Other (See Comments)    Hallucinations    Latex Rash    Sneezing, watery eyes.   Niaspan [Niacin Er (Antihyperlipidemic)] Swelling and Rash    Past Medical History:  Diagnosis Date   Adenomatous colon polyp    Allergic rhinitis due to pollen    Anemia     Anxiety    Bronchiectasis (HCC)    Complication of anesthesia    Degenerative disc disease    cervical and lumbar   Dysrhythmia    GERD (gastroesophageal reflux disease)    Hearing loss in right ear    Heart murmur    HOH (hard of hearing)    AIDS   Hyperlipidemia    Hypertension    Osteoarthritis, multiple sites    PAF (paroxysmal atrial fibrillation) (HCC)    a. 01/2013 Echo [facility] EF 60-65%, no rwma, mildly dil LA/RA, mild AI/TR, trace MR, nl RV size/fxn, mild PAH; b. CHA2DS2VASc = 4-->Chronic Xarelto.   PMR (polymyalgia rheumatica) (HCC)    PONV (postoperative nausea and vomiting)    Rheumatic fever    Sleep apnea    NO CPAP   Urge incontinence    Vertigo    Vertigo     Past Surgical History:  Procedure Laterality Date   ABDOMINAL HYSTERECTOMY     ANKLE FRACTURE SURGERY Left 1996   BACK SURGERY     BREAST BIOPSY Left 2010   negative   CATARACT EXTRACTION W/PHACO Left 10/05/2018   Procedure: CATARACT EXTRACTION PHACO AND INTRAOCULAR LENS PLACEMENT (IOC);  Surgeon: Galen Manila, MD;  Location: [facility] ORS;  Service: Ophthalmology;  Laterality: Left;  [place] 00:40 CDE 6.92 fluid pack lot # [id number]      CATARACT EXTRACTION W/PHACO Right 10/26/2018   Procedure: CATARACT EXTRACTION PHACO AND INTRAOCULAR LENS PLACEMENT (IOC);  Surgeon: Galen Manila, MD;  Location: [facility] ORS;  Service: Ophthalmology;  Laterality: Right;  [place]  00:47 CDE 7.11 Fluid pack lot # [id number]   DEXA  11/09/06   normal   FRACTURE SURGERY     Meningitis  1963   SPINE SURGERY     SYNOVECTOMY Right 1973   elbow   TONSILLECTOMY AND ADENOIDECTOMY      Family History  Problem Relation Age of Onset   Schizophrenia Mother        paranoid   Heart disease Mother        from psych meds   Arthritis Mother    Rheum arthritis Mother    Cancer Father        prostate   Arthritis Father    Diabetes Father    Breast cancer Paternal Aunt 42   Ovarian cancer Maternal Grandmother     Uterine cancer Maternal Grandmother    Colon cancer Maternal Grandmother    Healthy Son     Social History   Socioeconomic History   Marital status: Married    Spouse name: Not on file   Number of children: 1  Years of education: Not on file   Highest education level: Not on file  Occupational History   Occupation: Production designer, theatre/television/film and college professor    Comment: [employer]--PhD in Special education  Tobacco Use   Smoking status: Never    Passive exposure: Never   Smokeless tobacco: Never  Vaping Use   Vaping status: Never Used  Substance and Sexual Activity   Alcohol use: Not Currently   Drug use: No   Sexual activity: Not on file  Other Topics Concern   Not on file  Social History Narrative   Retired 2007    1 son in [place] area   Spends part time in [place]      Has living will   Husband then son is health care POA--will need to be son with husband's decline   Not sure about DNR---will keep open resuscitation for now    No tube feedings if cognitively unaware   Right handed               Social Determinants of Health   Financial Resource Strain: Not on file  Food Insecurity: Not on file  Transportation Needs: Not on file  Physical Activity: Not on file  Stress: Not on file  Social Connections: Not on file  Intimate Partner Violence: Not on file   Review of Systems Some shooting pain in right calf---episodic over past 2 weeks Treatment for myasthenia is okay--as far as she can tell--keeps up with neurology No urinary symptoms    Objective:   Physical Exam Pulmonary:     Effort: Pulmonary effort is normal.     Breath sounds: Normal breath sounds. No wheezing or rales.  Abdominal:     General: Bowel sounds are normal. There is no distension.     Palpations: Abdomen is soft.     Tenderness: There is no guarding or rebound.     Comments: Mild tenderness RUQ, LUQ and suprapubic  Musculoskeletal:     Cervical back: Neck supple.     Comments: No  calf swelling or tenderness  Lymphadenopathy:     Cervical: No cervical adenopathy.            Assessment & Plan:

## 2023-11-18 DIAGNOSIS — H11123 Conjunctival concretions, bilateral: Secondary | ICD-10-CM | POA: Diagnosis not present

## 2023-11-18 DIAGNOSIS — M3501 Sicca syndrome with keratoconjunctivitis: Secondary | ICD-10-CM | POA: Diagnosis not present

## 2023-11-27 ENCOUNTER — Ambulatory Visit
Admission: RE | Admit: 2023-11-27 | Discharge: 2023-11-27 | Disposition: A | Discharge status: Home / Self Care | Payer: Medicare PPO | Source: Ambulatory Visit | Attending: Internal Medicine | Admitting: Internal Medicine

## 2023-11-27 ENCOUNTER — Other Ambulatory Visit: Payer: Self-pay | Admitting: Neurology

## 2023-11-27 DIAGNOSIS — R1011 Right upper quadrant pain: Secondary | ICD-10-CM | POA: Diagnosis not present

## 2023-12-02 DIAGNOSIS — Z0100 Encounter for examination of eyes and vision without abnormal findings: Secondary | ICD-10-CM | POA: Diagnosis not present

## 2023-12-08 ENCOUNTER — Ambulatory Visit: Payer: Medicare PPO | Admitting: Internal Medicine

## 2023-12-08 ENCOUNTER — Encounter: Payer: Self-pay | Admitting: Internal Medicine

## 2023-12-08 VITALS — BP 138/86 | HR 61 | Temp 99.1°F | Ht 66.5 in | Wt 158.0 lb

## 2023-12-08 DIAGNOSIS — F39 Unspecified mood [affective] disorder: Secondary | ICD-10-CM

## 2023-12-08 DIAGNOSIS — M353 Polymyalgia rheumatica: Secondary | ICD-10-CM | POA: Diagnosis not present

## 2023-12-08 DIAGNOSIS — G7000 Myasthenia gravis without (acute) exacerbation: Secondary | ICD-10-CM

## 2023-12-08 DIAGNOSIS — Z0000 Encounter for general adult medical examination without abnormal findings: Secondary | ICD-10-CM

## 2023-12-08 DIAGNOSIS — I480 Paroxysmal atrial fibrillation: Secondary | ICD-10-CM | POA: Diagnosis not present

## 2023-12-08 NOTE — Assessment & Plan Note (Signed)
Reactive depression and some anxiety No MDD Just uses the lorazepam rarely

## 2023-12-08 NOTE — Progress Notes (Signed)
Subjective:    Patient ID: Jennifer Schmitt, female    DOB: May 24, 1935, 88 y.o.   MRN: [id number]  HPI Here for Medicare wellness visit and follow up of chronic health conditions Reviewed advanced directives Reviewed other doctors---Dr Hlll--neurology, Dr Riley Nearing, Dr Sarajane Jews cardiology, Dr Gollan--cardiology, Dr Pleas Koch, Dr Wellington Hampshire, Dr Maisie Fus, Dr Shelton Silvas No hospitalizations or surgery in the past year Stays very active doing all housework, etc Vision is okay Hearing is not great---going back to ENT. Deaf in right ear No alcohol or tobacco No falls Gets episodic depressed mood--cries thinking about husband at times. Didn't like trial of antidepressant. Rarely uses lorazepam Independent with instrumental ADLs Short term memory issues---no functional limitations. Back to driving again  Having allergy attack--didn't get her xyzal Stress this morning with husband---aide couldn't get him settled down He does day program 4 days per week---not ready to place him in memory care Using all services---bid care with personal care and even medications Housekeeping, etc  Myasthenia has been controlled On cellcept and mestinon Prednisone down to 4mg  daily---PMR seems in remission Goes back to Dr Loleta Chance soon  Still some RUQ  Ultrasound was negative Bowels are moving a lot--diarrhea at times  No heart problems No palpitations No chest pain or SOB No dizziness or syncope No edema  Current Outpatient Medications on File Prior to Visit  Medication Sig Dispense Refill   acetaminophen (TYLENOL) 500 MG tablet Take 1,000 mg by mouth as needed.     amiodarone (PACERONE) 200 MG tablet Take 1 tablet (200 mg total) by mouth every other day. 90 tablet 0   BD SYRINGE SLIP TIP 25G X 5/8" 1 ML MISC      calcium carbonate (CALCIUM 600) 600 MG TABS tablet Take 1 tablet (600 mg total) by mouth daily. 30 tablet 0   Cholecalciferol (VITAMIN D) 2000 units  CAPS Take 2,000 Units by mouth daily.     cyanocobalamin (VITAMIN B12) 1000 MCG/ML injection INJECT 1 ML MONTHLY AS DIRECTED 1 mL 11   diltiazem (CARDIZEM) 30 MG tablet Take 1 tablet (30 mg total) by mouth 3 (three) times daily as needed (atrial fibrillation lasting more than 2 hours). 60 tablet 3   fluticasone (FLONASE) 50 MCG/ACT nasal spray Place 1 spray into both nostrils daily.     folic acid (FOLVITE) 1 MG tablet TAKE 1 TABLET BY MOUTH DAILY 90 tablet 3   hydrocortisone 2.5 % cream Apply topically 3 (three) times daily as needed. 28 g 3   lansoprazole (PREVACID) 15 MG capsule TAKE ONE CAPSULE BY MOUTH DAILY AT 12 (NOON) 30 capsule 2   LORazepam (ATIVAN) 0.5 MG tablet TAKE ONE HALF TO 1 TABLET BY MOUTH TWICEDAILY AS NEEDED FOR ANXIETY 30 tablet 0   metoprolol succinate (TOPROL-XL) 25 MG 24 hr tablet TAKE ONE TABLET BY MOUTH DAILY 90 tablet 3   MIEBO 1.338 GM/ML SOLN Apply 1 drop to eye daily.     Multiple Vitamins-Minerals (PRESERVISION AREDS 2 PO) Take by mouth daily.     mycophenolate (CELLCEPT) 500 MG tablet Take 1 tablet (500 mg total) by mouth 2 (two) times daily. 60 tablet 11   ondansetron (ZOFRAN) 4 MG tablet Take 1 tablet (4 mg total) by mouth 3 (three) times daily as needed for nausea or vomiting. 30 tablet 0   predniSONE (DELTASONE) 1 MG tablet Take 4 tablets (4 mg total) by mouth daily with breakfast. (Patient taking differently: Take 3 mg by mouth daily with breakfast.) 120 tablet 5  propranolol (INDERAL) 10 MG tablet Take 1 tablet (10 mg total) by mouth 3 (three) times daily. daily as needed (for breakthrough atrial fibrillation) 60 tablet 1   pyridostigmine (MESTINON) 60 MG tablet Take 1 tablet at 7 AM, 1 tablet at 1 PM and 1 tablet at 7 PM 90 tablet 5   rosuvastatin (CRESTOR) 5 MG tablet Take 1 tablet (5 mg total) by mouth daily. 90 tablet 3   Trolamine Salicylate (ASPERCREME EX) Apply topically as needed.     valsartan (DIOVAN) 80 MG tablet TAKE HALF A TABLET BY MOUTH DAILY  135 tablet 0   XARELTO 20 MG TABS tablet TAKE ONE TABLET BY MOUTH ONCE A DAY WITH THE EVENING MEAL. 90 tablet 1   No current facility-administered medications on file prior to visit.    Allergies  Allergen Reactions   Actonel [Risedronate Sodium] Nausea And Vomiting   Boniva [Ibandronic Acid] Nausea And Vomiting   Diclofenac Other (See Comments)    Voltaren Gel aggravated her sinus passages   Fosamax [Alendronate Sodium] Nausea And Vomiting   Lipitor [Atorvastatin] Other (See Comments)    Muscle aches. Tolerates Crestor.   Miacalcin [Calcitonin ([place])] Other (See Comments)    Sneezing    Myrbetriq [Mirabegron] Other (See Comments)    Gastritis   Sertraline Hcl Other (See Comments)    Irritable, etc   Talwin [Pentazocine] Other (See Comments)    Hallucinations    Latex Rash    Sneezing, watery eyes.   Niaspan [Niacin Er (Antihyperlipidemic)] Swelling and Rash    Past Medical History:  Diagnosis Date   Adenomatous colon polyp    Allergic rhinitis due to pollen    Anemia    Anxiety    Bronchiectasis (HCC)    Complication of anesthesia    Degenerative disc disease    cervical and lumbar   Dysrhythmia    GERD (gastroesophageal reflux disease)    Hearing loss in right ear    Heart murmur    HOH (hard of hearing)    AIDS   Hyperlipidemia    Hypertension    Osteoarthritis, multiple sites    PAF (paroxysmal atrial fibrillation) (HCC)    a. 01/2013 Echo [facility]): EF 60-65%, no rwma, mildly dil LA/RA, mild AI/TR, trace MR, nl RV size/fxn, mild PAH; b. CHA2DS2VASc = 4-->Chronic Xarelto.   PMR (polymyalgia rheumatica) (HCC)    PONV (postoperative nausea and vomiting)    Rheumatic fever    Sleep apnea    NO CPAP   Urge incontinence    Vertigo    Vertigo     Past Surgical History:  Procedure Laterality Date   ABDOMINAL HYSTERECTOMY     ANKLE FRACTURE SURGERY Left 1996   BACK SURGERY     BREAST BIOPSY Left 2010   negative   CATARACT EXTRACTION W/PHACO Left  10/05/2018   Procedure: CATARACT EXTRACTION PHACO AND INTRAOCULAR LENS PLACEMENT (IOC);  Surgeon: Galen Manila, MD;  Location: [facility] ORS;  Service: Ophthalmology;  Laterality: Left;  [place] 00:40 CDE 6.92 fluid pack lot # [id number]      CATARACT EXTRACTION W/PHACO Right 10/26/2018   Procedure: CATARACT EXTRACTION PHACO AND INTRAOCULAR LENS PLACEMENT (IOC);  Surgeon: Galen Manila, MD;  Location: [facility] ORS;  Service: Ophthalmology;  Laterality: Right;  [place]  00:47 CDE 7.11 Fluid pack lot # [id number]   DEXA  11/09/06   normal   FRACTURE SURGERY     Meningitis  1963   SPINE SURGERY     SYNOVECTOMY Right  1973   elbow   TONSILLECTOMY AND ADENOIDECTOMY      Family History  Problem Relation Age of Onset   Schizophrenia Mother        paranoid   Heart disease Mother        from psych meds   Arthritis Mother    Rheum arthritis Mother    Cancer Father        prostate   Arthritis Father    Diabetes Father    Breast cancer Paternal Aunt 56   Ovarian cancer Maternal Grandmother    Uterine cancer Maternal Grandmother    Colon cancer Maternal Grandmother    Healthy Son     Social History   Socioeconomic History   Marital status: Married    Spouse name: Not on file   Number of children: 1   Years of education: Not on file   Highest education level: Not on file  Occupational History   Occupation: Production designer, theatre/television/film and college professor    Comment: [employer]--PhD in Special education  Tobacco Use   Smoking status: Never    Passive exposure: Never   Smokeless tobacco: Never  Vaping Use   Vaping status: Never Used  Substance and Sexual Activity   Alcohol use: Not Currently   Drug use: No   Sexual activity: Not on file  Other Topics Concern   Not on file  Social History Narrative   Retired 2007    1 son in [place] area   Spends part time in [place]      Has living will   Son is health care POA   Not sure about DNR---will keep open resuscitation for now    No tube  feedings if cognitively unaware   Right handed               Social Drivers of Corporate investment banker Strain: Not on file  Food Insecurity: Not on file  Transportation Needs: Not on file  Physical Activity: Not on file  Stress: Not on file  Social Connections: Not on file  Intimate Partner Violence: Not on file   Review of Systems Appetite is good Weight has stabilized Sleeps okay--husband doesn't get up in night Wears seat belt Teeth okay--keeps up with dentist No heartburn or dysphagia Bowels move --sometimes too much No sig back or joint pains---just "twinges" in various places. Uses tylenol rarely Occ dull headaches--doesn't generally need medications    Objective:   Physical Exam Constitutional:      Appearance: Normal appearance.  HENT:     Mouth/Throat:     Pharynx: No oropharyngeal exudate or posterior oropharyngeal erythema.  Eyes:     Conjunctiva/sclera: Conjunctivae normal.     Pupils: Pupils are equal, round, and reactive to light.  Cardiovascular:     Rate and Rhythm: Normal rate and regular rhythm.     Pulses: Normal pulses.     Heart sounds:     No gallop.     Comments: Gr 3/6 systolic murmur at base Pulmonary:     Effort: Pulmonary effort is normal.     Breath sounds: Normal breath sounds. No wheezing or rales.  Abdominal:     Palpations: Abdomen is soft.     Tenderness: There is no abdominal tenderness.  Musculoskeletal:     Cervical back: Neck supple.     Right lower leg: No edema.     Left lower leg: No edema.  Lymphadenopathy:     Cervical: No cervical adenopathy.  Skin:    Findings: No rash.  Neurological:     General: No focal deficit present.     Mental Status: She is alert and oriented to person, place, and time.     Comments: Word naming-- 14/1 minute Recall 1/3  Psychiatric:        Mood and Affect: Mood normal.        Behavior: Behavior normal.            Assessment & Plan:

## 2023-12-08 NOTE — Progress Notes (Signed)
Hearing Screening - Comments:: Deaf in right ear.  Vision Screening - Comments:: December 2024

## 2023-12-08 NOTE — Assessment & Plan Note (Signed)
In remission on lower prednisone--4mg  May be helped by MG immunosuppressants

## 2023-12-08 NOTE — Assessment & Plan Note (Signed)
Doing well now cellcept and mestinon

## 2023-12-08 NOTE — Assessment & Plan Note (Signed)
Regular on the amiodarone 200mg  daily and metoprolol 25mg  daily Xarelto 20mg  daily

## 2023-12-08 NOTE — Assessment & Plan Note (Signed)
I have personally reviewed the Medicare Annual Wellness questionnaire and have noted 1. The patient's medical and social history 2. Their use of alcohol, tobacco or illicit drugs 3. Their current medications and supplements 4. The patient's functional ability including ADL's, fall risks, home safety risks and hearing or visual             impairment. 5. Diet and physical activities 6. Evidence for depression or mood disorders  The patients weight, height, BMI and visual acuity have been recorded in the chart I have made referrals, counseling and provided education to the patient based review of the above and I have provided the pt with a written personalized care plan for preventive services.  I have provided you with a copy of your personalized plan for preventive services. Please take the time to review along with your updated medication list.  Done with cancer screening Does stay active Had COVID and flu updates May need RSV--she will check

## 2023-12-26 ENCOUNTER — Other Ambulatory Visit: Payer: Self-pay | Admitting: Internal Medicine

## 2023-12-31 DIAGNOSIS — F4323 Adjustment disorder with mixed anxiety and depressed mood: Secondary | ICD-10-CM | POA: Diagnosis not present

## 2024-01-04 ENCOUNTER — Other Ambulatory Visit: Payer: Self-pay | Admitting: Internal Medicine

## 2024-01-14 DIAGNOSIS — F4323 Adjustment disorder with mixed anxiety and depressed mood: Secondary | ICD-10-CM | POA: Diagnosis not present

## 2024-01-22 ENCOUNTER — Other Ambulatory Visit: Payer: Self-pay | Admitting: Internal Medicine

## 2024-01-22 ENCOUNTER — Other Ambulatory Visit: Payer: Self-pay | Admitting: Cardiovascular Disease

## 2024-01-22 DIAGNOSIS — I4819 Other persistent atrial fibrillation: Secondary | ICD-10-CM

## 2024-01-22 NOTE — Telephone Encounter (Signed)
 Refill Request.

## 2024-01-22 NOTE — Telephone Encounter (Signed)
Xarelto 20mg  refill request received. Pt is 89 years old, weight-71.7kg, Crea-0.85 on 09/11/23, last seen by Levy Sjogren on 09/11/23, Diagnosis-Afib, CrCl-53.78 mL/min; Dose is appropriate based on dosing criteria. Will send in refill to requested pharmacy.

## 2024-01-25 DIAGNOSIS — L308 Other specified dermatitis: Secondary | ICD-10-CM | POA: Diagnosis not present

## 2024-01-25 DIAGNOSIS — L570 Actinic keratosis: Secondary | ICD-10-CM | POA: Diagnosis not present

## 2024-01-25 DIAGNOSIS — L853 Xerosis cutis: Secondary | ICD-10-CM | POA: Diagnosis not present

## 2024-02-04 NOTE — Progress Notes (Deleted)
 I saw Jennifer Schmitt in neurology clinic on 02/12/24 in follow up for AChR ab positive myasthenia gravis.  HPI: Jennifer Schmitt is a 89 y.o. year old female with a history of seropositive myasthenia gravis, polymyalgia rheumatica, HTN, HLD, pAfib, and OA who we last saw on 08/05/23.  To briefly review: Patient was previously seen in this office by Dr. Everlena Cooper (last seen on 06/05/22). Per initial clinic note on 05/08/22: Since January-February 2023, she has had progressive weakness.  She began having trouble rolling over in bed.  She was unable to get up and off her bed so she started sleeping on the couch..  Noted weakness in the left leg.  Seems to be dragging her right leg.  When standing, always with sensation that legs will give out. She has had falls and unable to stand up, requiring to call EMS.  Notes particular weakness in the proximal arms and trouble keeping her head up.  Started relying on a cane more frequently and then wheelchair.  She also endorsed worsening musculoskeletal pain with pain from the neck down her spine to her sacrum.  She does have degenerative spine disease and polymyalgia rheumatica.  MRI of brain with and without contrast on 04/27/2022 personally reviewed showed moderate chronic small vessel ischemic changes and known 1 x 2 mm vestibular schwannoma within the right internal auditory canal, stable compared to prior imaging in 2017.  She started noticing that her left eyelid was drooping.  She went to the ophthalmologist who suspected temporal arteritis given her history of PMR.  No vision loss.  Sed rate and CRP on 04/23/2022 were 101 and 10.1 respectively and was started on prednisone 60mg  daily with some improvement.  Based on strong likelihood of giant cell arteritis with elevated inflammatory markers and having PMR, temporal artery biopsy was not pursued.  She was started on prednisone 60mg  daily but discontinued after 3 days due to side effects.  She also has had increased  urinary urgency and sometimes fecal incontinence.  Due to sacral pain following a fall, she had X-ray of lumbar spine and MRI of sacrum which were negative for occult fracture.  AChR Binding Ab on 04/28/2022 was elevated at 28.10.  PCP started her on pyridostigmine 30mg  three times daily.  She notes improvement in strength in the legs.  However, she still has a head drop.  Denies double vision, trouble swallowing, trouble chewing, and trouble taking a breath.   At 05/08/22 visit, Dr. Everlena Cooper recommended prednisone 20 mg daily, mestinon 60 mg TID (7 am, 1 pm, 7 pm). CT chest on 05/15/22 was negative for mediastinal mass (no evidence of thymoma). Patient discontinued prednisone shortly after starting as it made her jittery and not able to sleep. At follow up with Dr. Everlena Cooper on 06/05/22, patient was not feeling well, but was walking a little better. She still had head drop. Hospital admission for a course of IVIg was suggested to prevent MG crisis, but patient declined as she needed to be home to take care of her husband with Alzheimer's disease. Cellcept 500 mg BID was started with IVIg every 28 days until Cellcept was therapeutic. She was also continued on mestinon. CBC and CMP were set up to be check weekly for 4 weeks, followed by monthly for 3 months. Patient called the next day though because she did not want IVIg. She decided to restart prednisone instead.   She felt much improved on 09/05/22. She is about 75% better than prior visit.  Patient lives at a retirement with healthcare and PT/OT. She has many resources there. Patient went to PT and OT. She is currently done with both. Husband is currently in a memory care unit twice a week.   Current medications: Cellcept 500 mg BID, Prednisone 20 mg once in the morning, Mestinon 60 mg TID (7:30-8am, 12pm, 8-9pm).   Side effects: Cellcept is difficult to take on a empty stomach. She gets a little nausea but this is moderately improved. She has hot flashes or  temperature fluctuations that are episodic. She is otherwise not having significant side effects.   MG symptoms on 09/05/22: Ptosis: Some drooping on left, pretty constant; improved compared to prior, worse when tired, not bothersome Double vision: Never Speech: Occasionally slurred speech, especially when tired; no one else commenting on it per patient Chewing: No problems currently Swallowing: Occasionally, episodic difficulty requiring a double swallow; no coughing Breathing: No problems currently Arm strength: No problems; Neck is still weak, but much improved. Cannot sit up straight for long periods of time Leg strength: Walks with a walker. She feels like her legs are strong. She has a walker due to not being able to stand up right.   At her visit on 09/05/22, we continued prednisone 20 mg daily, Cellcept 500 mg BID, but started tapering her mestinon (stopped on 09/11/22). I called the patient on 09/12/22 at which time she noted no change in her symptoms off mestinon. As a result, we agreed to begin tapering her prednisone. She decreased to 15 mg on 09/13/22.   At 02/12/23 visit, patient was doing well with minimal symptoms. She was on prednisone 10 mg daily Cellcept 500 mg BID and not taking mestinon. Prednisone was decreased to 7.5 mg daily on 02/12/23 (other medications kept the same). She mentioned neck and back pain and numbness in the 5th digit of bilateral hands at this visit.   At 05/01/23 visit, patient was doing well from an MG perspective. There was some confusion on whether she was currently on 5 mg or 7.5 mg of prednisone (supposed to be on 7.5 mg but thinks she was taking 5 mg).  At 08/05/23 visit, patient was doing well from an MG perspective. She did mention a mild increase in chronic headaches (1 per week) and occasional muscle stiffness. Her prednisone was decreased to 4 mg daily.  Most recent Assessment and Plan (08/05/23): This is Jennifer Schmitt, a 89 y.o. female with AChR ab  positive generalized myasthenia gravis (symptom onset 12/2021 with diagnosis 04/28/2022). CT chest on 05/13/22 with no mediastinal mass. She currently has minimal MG symptoms.   Plan: -Continue Cellcept 500 mg BID -Decrease prednisone to 4 mg -Patient instructed to increase back to 5 mg if MG symptoms worsen and call the office. -Mestinon 60 mg as needed -Continue staying active -Discussed MG crisis, what to watch for, and when to go to nearest ED including difficulty breathing or swallowing -Will check labs at next follow up (CBC w/ diff and CMP)  Since their last visit: ***  Current MG symptoms: Ptosis: *** Double vision: *** Speech: *** Chewing: *** Swallowing: *** Breathing: *** Arm strength: *** Leg strength: ***  Current medications:  -Cellcept 500 mg BID -Prednisone 4 mg -Mestinon 60 mg PRN***  Side effects: ***   Headaches?***  ROS: Pertinent positive and negative systems reviewed in HPI. ***   MEDICATIONS:  Outpatient Encounter Medications as of 02/12/2024  Medication Sig   acetaminophen (TYLENOL) 500 MG tablet Take 1,000 mg  by mouth as needed.   amiodarone (PACERONE) 200 MG tablet Take 1 tablet (200 mg total) by mouth every other day.   BD SYRINGE SLIP TIP 25G X 5/8" 1 ML MISC    calcium carbonate (CALCIUM 600) 600 MG TABS tablet Take 1 tablet (600 mg total) by mouth daily.   Cholecalciferol (VITAMIN D) 2000 units CAPS Take 2,000 Units by mouth daily.   cyanocobalamin (VITAMIN B12) 1000 MCG/ML injection INJECT ONE ML MONTHLY AS INSTRUCTED   diltiazem (CARDIZEM) 30 MG tablet Take 1 tablet (30 mg total) by mouth 3 (three) times daily as needed (atrial fibrillation lasting more than 2 hours).   fluticasone (FLONASE) 50 MCG/ACT nasal spray Place 1 spray into both nostrils daily.   folic acid (FOLVITE) 1 MG tablet TAKE ONE TABLET BY MOUTH DAILY   hydrocortisone 2.5 % cream Apply topically 3 (three) times daily as needed.   lansoprazole (PREVACID) 15 MG capsule TAKE  ONE CAPSULE BY MOUTH DAILY AT 12 (NOON)   LORazepam (ATIVAN) 0.5 MG tablet TAKE ONE HALF TO 1 TABLET BY MOUTH TWICEDAILY AS NEEDED FOR ANXIETY   metoprolol succinate (TOPROL-XL) 25 MG 24 hr tablet TAKE ONE TABLET BY MOUTH DAILY   MIEBO 1.338 GM/ML SOLN Apply 1 drop to eye daily.   Multiple Vitamins-Minerals (PRESERVISION AREDS 2 PO) Take by mouth daily.   mycophenolate (CELLCEPT) 500 MG tablet Take 1 tablet (500 mg total) by mouth 2 (two) times daily.   ondansetron (ZOFRAN) 4 MG tablet Take 1 tablet (4 mg total) by mouth 3 (three) times daily as needed for nausea or vomiting.   predniSONE (DELTASONE) 1 MG tablet Take 4 tablets (4 mg total) by mouth daily with breakfast. (Patient taking differently: Take 3 mg by mouth daily with breakfast.)   propranolol (INDERAL) 10 MG tablet Take 1 tablet (10 mg total) by mouth 3 (three) times daily. daily as needed (for breakthrough atrial fibrillation)   pyridostigmine (MESTINON) 60 MG tablet Take 1 tablet at 7 AM, 1 tablet at 1 PM and 1 tablet at 7 PM   rosuvastatin (CRESTOR) 5 MG tablet TAKE ONE TABLET BY MOUTH ONCE A DAY   Trolamine Salicylate (ASPERCREME EX) Apply topically as needed.   valsartan (DIOVAN) 80 MG tablet TAKE HALF A TABLET BY MOUTH DAILY   XARELTO 20 MG TABS tablet TAKE ONE TABLET BY MOUTH ONCE A DAY WITH THE EVENING MEAL.   No facility-administered encounter medications on file as of 02/12/2024.    PAST MEDICAL HISTORY: Past Medical History:  Diagnosis Date   Adenomatous colon polyp    Allergic rhinitis due to pollen    Anemia    Anxiety    Bronchiectasis (HCC)    Complication of anesthesia    Degenerative disc disease    cervical and lumbar   Dysrhythmia    GERD (gastroesophageal reflux disease)    Hearing loss in right ear    Heart murmur    HOH (hard of hearing)    AIDS   Hyperlipidemia    Hypertension    Osteoarthritis, multiple sites    PAF (paroxysmal atrial fibrillation) (HCC)    a. 01/2013 Echo [facility] EF  60-65%, no rwma, mildly dil LA/RA, mild AI/TR, trace MR, nl RV size/fxn, mild PAH; b. CHA2DS2VASc = 4-->Chronic Xarelto.   PMR (polymyalgia rheumatica) (HCC)    PONV (postoperative nausea and vomiting)    Rheumatic fever    Sleep apnea    NO CPAP   Urge incontinence    Vertigo  Vertigo     PAST SURGICAL HISTORY: Past Surgical History:  Procedure Laterality Date   ABDOMINAL HYSTERECTOMY     ANKLE FRACTURE SURGERY Left 1996   BACK SURGERY     BREAST BIOPSY Left 2010   negative   CATARACT EXTRACTION W/PHACO Left 10/05/2018   Procedure: CATARACT EXTRACTION PHACO AND INTRAOCULAR LENS PLACEMENT (IOC);  Surgeon: Galen Manila, MD;  Location: [facility] ORS;  Service: Ophthalmology;  Laterality: Left;  [place] 00:40 CDE 6.92 fluid pack lot # [id number]      CATARACT EXTRACTION W/PHACO Right 10/26/2018   Procedure: CATARACT EXTRACTION PHACO AND INTRAOCULAR LENS PLACEMENT (IOC);  Surgeon: Galen Manila, MD;  Location: [facility] OR[facility];  Service: Ophthalmology;  Laterality: Right;  [place]  00:47 CDE 7.11 Fluid pack lot # [id number]   DEXA  11/09/06   normal   FRACTURE SURGERY     Meningitis  1963   SPINE SURGERY     SYNOVECTOMY Right 1973   elbow   TONSILLECTOMY AND ADENOIDECTOMY      ALLERGIES: Allergies  Allergen Reactions   Actonel [Risedronate Sodium] Nausea And Vomiting   Boniva [Ibandronate] Nausea And Vomiting   Diclofenac Other (See Comments)    Voltaren Gel aggravated her sinus passages   Fosamax [Alendronate Sodium] Nausea And Vomiting   Lipitor [Atorvastatin] Other (See Comments)    Muscle aches. Tolerates Crestor.   Miacalcin [Calcitonin ([place])] Other (See Comments)    Sneezing    Myrbetriq [Mirabegron] Other (See Comments)    Gastritis   Sertraline Hcl Other (See Comments)    Irritable, etc   Talwin [Pentazocine] Other (See Comments)    Hallucinations    Latex Rash    Sneezing, watery eyes.   Niaspan [Niacin Er (Antihyperlipidemic)] Swelling and Rash    FAMILY  HISTORY: Family History  Problem Relation Age of Onset   Schizophrenia Mother        paranoid   Heart disease Mother        from psych meds   Arthritis Mother    Rheum arthritis Mother    Cancer Father        prostate   Arthritis Father    Diabetes Father    Breast cancer Paternal Aunt 81   Ovarian cancer Maternal Grandmother    Uterine cancer Maternal Grandmother    Colon cancer Maternal Grandmother    Healthy Son     SOCIAL HISTORY: Social History   Tobacco Use   Smoking status: Never    Passive exposure: Never   Smokeless tobacco: Never  Vaping Use   Vaping status: Never Used  Substance Use Topics   Alcohol use: Not Currently   Drug use: No   Social History   Social History Narrative   Retired 2007    1 son in [place] area   Spends part time in [place]      Has living will   Son is health care POA   Not sure about DNR---will keep open resuscitation for now    No tube feedings if cognitively unaware   Right handed                Objective:  Vital Signs:  There were no vitals taken for this visit.  General:*** General appearance: Awake and alert. No distress. Cooperative with exam.  Skin: No obvious rash or jaundice. HEENT: Atraumatic. Anicteric. Lungs: Non-labored breathing on room air  Heart: Regular Abdomen: Soft, non tender. Extremities: No edema. No obvious deformity.  Musculoskeletal: No obvious joint swelling.  Neurological: Mental Status: Alert. Speech fluent. No pseudobulbar affect Cranial Nerves: CNII: No RAPD. Visual fields intact. CNIII, IV, VI: PERRL. No nystagmus. EOMI. CN V: Facial sensation intact bilaterally to fine touch. [place] clench strong. Jaw jerk***. CN VII: Facial muscles symmetric and strong. No ptosis at rest or after sustained upgaze***. CN VIII: Hears finger rub well bilaterally. CN IX: No hypophonia. CN X: Palate elevates symmetrically. CN XI: Full strength shoulder shrug bilaterally. CN XII: Tongue  protrusion full and midline. No atrophy or fasciculations. No significant dysarthria*** Motor: Tone is ***. *** fasciculations in *** extremities. *** atrophy. No grip or percussive myotonia.  Individual muscle group testing (MRC grade out of 5):  Movement     Neck flexion ***    Neck extension ***     Right Left   Shoulder abduction *** ***   Shoulder adduction *** ***   Shoulder ext rotation *** ***   Shoulder int rotation *** ***   Elbow flexion *** ***   Elbow extension *** ***   Wrist extension *** ***   Wrist flexion *** ***   Finger abduction - FDI *** ***   Finger abduction - ADM *** ***   Finger extension *** ***   Finger distal flexion - 2/3 *** ***   Finger distal flexion - 4/5 *** ***   Thumb flexion - FPL *** ***   Thumb abduction - APB *** ***    Hip flexion *** ***   Hip extension *** ***   Hip adduction *** ***   Hip abduction *** ***   Knee extension *** ***   Knee flexion *** ***   Dorsiflexion *** ***   Plantarflexion *** ***   Inversion *** ***   Eversion *** ***   Great toe extension *** ***   Great toe flexion *** ***     Reflexes:  Right Left  Bicep *** ***  Tricep *** ***  BrRad *** ***  Knee *** ***  Ankle *** ***   Pathological Reflexes: Babinski: *** response bilaterally*** Hoffman: *** Troemner: *** Pectoral: *** Palmomental: *** Facial: *** Midline tap: *** Sensation: Pinprick: *** Vibration: *** Temperature: *** Proprioception: *** Coordination: Intact finger-to- nose-finger and heel-to-shin bilaterally. Romberg negative.*** Gait: Able to rise from chair with arms crossed unassisted. Normal, narrow-based gait. Able to tandem walk. Able to walk on toes and heels.***   Lab and Test Review: New results: 09/11/23: TSH wnl Free T4 wnl CMP unremarkable  Previously reviewed results: CBC w/ diff (05/01/23): unremarkable  03/13/23: CMP: unremarkable TSH: 1.963   10/08/22: CBC and CMP wnl   08/27/22: Normal or  unremarkable: CBC, CMP, TSH   08/15/22: Normal ESR and CRP   AChR binding ab: positive at 28.10   CT chest (05/15/22): FINDINGS: Cardiovascular: Moderate aortic atherosclerosis. No aneurysm. Coronary vascular calcification. Normal cardiac size. No pericardial effusion   Mediastinum/Nodes: Midline trachea. No thyroid mass. Subcarinal node measuring 12 mm. Esophagus is normal.   Lungs/Pleura: Mild bronchiectasis in the right middle lobe and bilateral lower lobes with scarring. No acute airspace disease, pleural effusion or pneumothorax.   Upper Abdomen: No acute abnormality.   Musculoskeletal: No chest wall abnormality. No acute or significant osseous findings.   IMPRESSION: 1. Negative for mediastinal mass lesion. 2. Mild bronchiectasis in the right middle and bilateral lower lobes with pulmonary scarring  ASSESSMENT: This is Jennifer Schmitt, a 89 y.o. female with AChR ab positive generalized myasthenia gravis (symptom onset 12/2021 with diagnosis 04/28/2022). CT chest on  05/13/22 with no mediastinal mass. She currently has minimal MG symptoms.   Plan: -Blood work: CBC w/ diff and CMP*** -Continue Cellcept 500 mg BID -Decrease prednisone to 3 mg*** -Patient instructed to increase back to 4 mg if MG symptoms worsen and call the office. -Mestinon 60 mg as needed -Continue staying active -Discussed MG crisis, what to watch for, and when to go to nearest ED including difficulty breathing or swallowing -Will check labs every 6 months  Return to clinic in ***  Total time spent reviewing records, interview, history/exam, documentation, and coordination of care on day of encounter:  *** min  Jacquelyne Balint, MD

## 2024-02-09 ENCOUNTER — Other Ambulatory Visit: Payer: Self-pay | Admitting: Neurology

## 2024-02-09 DIAGNOSIS — G7000 Myasthenia gravis without (acute) exacerbation: Secondary | ICD-10-CM

## 2024-02-12 ENCOUNTER — Telehealth: Payer: Self-pay | Admitting: Neurology

## 2024-02-12 ENCOUNTER — Ambulatory Visit: Payer: Self-pay | Admitting: Neurology

## 2024-02-12 NOTE — Telephone Encounter (Signed)
[facility] called to resch Dr.Wrights appt. Patients husband was sent to ER and she went with him. [facility] wanted me to see if Dr,Hill had sooner appts than June.

## 2024-02-18 NOTE — Progress Notes (Signed)
 I saw Jennifer Schmitt in neurology clinic on 03/01/24 in follow up for AChR ab positive myasthenia gravis.  HPI: Jennifer Schmitt is a 89 y.o. year old female with a history of seropositive myasthenia gravis, polymyalgia rheumatica, HTN, HLD, pAfib, and OA who we last saw on 08/05/23.  To briefly review: Patient was previously seen in this office by Dr. Everlena Cooper (last seen on 06/05/22). Per initial clinic note on 05/08/22: Since January-February 2023, she has had progressive weakness.  She began having trouble rolling over in bed.  She was unable to get up and off her bed so she started sleeping on the couch..  Noted weakness in the left leg.  Seems to be dragging her right leg.  When standing, always with sensation that legs will give out. She has had falls and unable to stand up, requiring to call EMS.  Notes particular weakness in the proximal arms and trouble keeping her head up.  Started relying on a cane more frequently and then wheelchair.  She also endorsed worsening musculoskeletal pain with pain from the neck down her spine to her sacrum.  She does have degenerative spine disease and polymyalgia rheumatica.  MRI of brain with and without contrast on 04/27/2022 personally reviewed showed moderate chronic small vessel ischemic changes and known 1 x 2 mm vestibular schwannoma within the right internal auditory canal, stable compared to prior imaging in 2017.  She started noticing that her left eyelid was drooping.  She went to the ophthalmologist who suspected temporal arteritis given her history of PMR.  No vision loss.  Sed rate and CRP on 04/23/2022 were 101 and 10.1 respectively and was started on prednisone 60mg  daily with some improvement.  Based on strong likelihood of giant cell arteritis with elevated inflammatory markers and having PMR, temporal artery biopsy was not pursued.  She was started on prednisone 60mg  daily but discontinued after 3 days due to side effects.  She also has had increased  urinary urgency and sometimes fecal incontinence.  Due to sacral pain following a fall, she had X-ray of lumbar spine and MRI of sacrum which were negative for occult fracture.  AChR Binding Ab on 04/28/2022 was elevated at 28.10.  PCP started her on pyridostigmine 30mg  three times daily.  She notes improvement in strength in the legs.  However, she still has a head drop.  Denies double vision, trouble swallowing, trouble chewing, and trouble taking a breath.   At 05/08/22 visit, Dr. Everlena Cooper recommended prednisone 20 mg daily, mestinon 60 mg TID (7 am, 1 pm, 7 pm). CT chest on 05/15/22 was negative for mediastinal mass (no evidence of thymoma). Patient discontinued prednisone shortly after starting as it made her jittery and not able to sleep. At follow up with Dr. Everlena Cooper on 06/05/22, patient was not feeling well, but was walking a little better. She still had head drop. Hospital admission for a course of IVIg was suggested to prevent MG crisis, but patient declined as she needed to be home to take care of her husband with Alzheimer's disease. Cellcept 500 mg BID was started with IVIg every 28 days until Cellcept was therapeutic. She was also continued on mestinon. CBC and CMP were set up to be check weekly for 4 weeks, followed by monthly for 3 months. Patient called the next day though because she did not want IVIg. She decided to restart prednisone instead.   She felt much improved on 09/05/22. She is about 75% better than prior visit.  Patient lives at a retirement with healthcare and PT/OT. She has many resources there. Patient went to PT and OT. She is currently done with both. Husband is currently in a memory care unit twice a week.   Current medications: Cellcept 500 mg BID, Prednisone 20 mg once in the morning, Mestinon 60 mg TID (7:30-8am, 12pm, 8-9pm).   Side effects: Cellcept is difficult to take on a empty stomach. She gets a little nausea but this is moderately improved. She has hot flashes or  temperature fluctuations that are episodic. She is otherwise not having significant side effects.   MG symptoms on 09/05/22: Ptosis: Some drooping on left, pretty constant; improved compared to prior, worse when tired, not bothersome Double vision: Never Speech: Occasionally slurred speech, especially when tired; no one else commenting on it per patient Chewing: No problems currently Swallowing: Occasionally, episodic difficulty requiring a double swallow; no coughing Breathing: No problems currently Arm strength: No problems; Neck is still weak, but much improved. Cannot sit up straight for long periods of time Leg strength: Walks with a walker. She feels like her legs are strong. She has a walker due to not being able to stand up right.   At her visit on 09/05/22, we continued prednisone 20 mg daily, Cellcept 500 mg BID, but started tapering her mestinon (stopped on 09/11/22). I called the patient on 09/12/22 at which time she noted no change in her symptoms off mestinon. As a result, we agreed to begin tapering her prednisone. She decreased to 15 mg on 09/13/22.   At 02/12/23 visit, patient was doing well with minimal symptoms. She was on prednisone 10 mg daily Cellcept 500 mg BID and not taking mestinon. Prednisone was decreased to 7.5 mg daily on 02/12/23 (other medications kept the same). She mentioned neck and back pain and numbness in the 5th digit of bilateral hands at this visit.   At 05/01/23 visit, patient was doing well from an MG perspective. There was some confusion on whether she was currently on 5 mg or 7.5 mg of prednisone (supposed to be on 7.5 mg but thinks she was taking 5 mg).  At 08/05/23 visit, patient was doing well from an MG perspective. She did mention a mild increase in chronic headaches (1 per week) and occasional muscle stiffness. Her prednisone was decreased to 4 mg daily.  Most recent Assessment and Plan (08/05/23): This is Jennifer Schmitt, a 89 y.o. female with AChR ab  positive generalized myasthenia gravis (symptom onset 12/2021 with diagnosis 04/28/2022). CT chest on 05/13/22 with no mediastinal mass. She currently has minimal MG symptoms.   Plan: -Continue Cellcept 500 mg BID -Decrease prednisone to 4 mg -Patient instructed to increase back to 5 mg if MG symptoms worsen and call the office. -Mestinon 60 mg as needed -Continue staying active -Discussed MG crisis, what to watch for, and when to go to nearest ED including difficulty breathing or swallowing -Will check labs at next follow up (CBC w/ diff and CMP)  Since their last visit: Had to reschedule on 02/12/24 because her husband was taken to ED. He is now in palliative care. She has been under a lot of stress due to this.  Patient went to ED on 02/28/24 for right flank pain and difficulty walking as a result. She was seen at urgent care a couple of weeks prior and thought to have UTI, so given antibiotics that helped. Symptoms then returned and were severe on the left. CT scan showed an  L4 compression fracture that was felt to be causing her symptoms. Dr. Madaline Brilliant of neurosurgery recommended LSO brace and outpatient follow up. She is wearing the lumbar brace. She does think this helps, but she cannot wear when she is sleeping and this gives her more pain when not in the brace.  She endorses numbness in fingers and toes for a couple of years. She thinks symptoms are stable but has noticed this more of late.  Current MG symptoms: Ptosis: occasional by someone noticing Double vision: none Speech: mild slurring Chewing: none Swallowing: feels things are not going down as well Breathing: none Arm strength: no changes Leg strength: no changes  Current medications:  -Cellcept 500 mg BID -Not been taking Prednisone 4 mg for about 1 month -Mestinon 60 mg PRN - taking morning and night  Side effects: No    MEDICATIONS:  Outpatient Encounter Medications as of 03/01/2024  Medication Sig   acetaminophen  (TYLENOL) 500 MG tablet Take 1,000 mg by mouth as needed.   amiodarone (PACERONE) 200 MG tablet Take 1 tablet (200 mg total) by mouth every other day.   BD SYRINGE SLIP TIP 25G X 5/8" 1 ML MISC    calcium carbonate (CALCIUM 600) 600 MG TABS tablet Take 1 tablet (600 mg total) by mouth daily.   cefdinir (OMNICEF) 300 MG capsule Take 1 capsule (300 mg total) by mouth 2 (two) times daily for 14 days.   Cholecalciferol (VITAMIN D) 2000 units CAPS Take 2,000 Units by mouth daily.   cyanocobalamin (VITAMIN B12) 1000 MCG/ML injection INJECT ONE ML MONTHLY AS INSTRUCTED   diltiazem (CARDIZEM) 30 MG tablet Take 1 tablet (30 mg total) by mouth 3 (three) times daily as needed (atrial fibrillation lasting more than 2 hours).   fluticasone (FLONASE) 50 MCG/ACT nasal spray Place 1 spray into both nostrils daily.   folic acid (FOLVITE) 1 MG tablet TAKE ONE TABLET BY MOUTH DAILY   hydrocortisone 2.5 % cream Apply topically 3 (three) times daily as needed.   lansoprazole (PREVACID) 15 MG capsule TAKE ONE CAPSULE BY MOUTH DAILY AT 12 (NOON)   LORazepam (ATIVAN) 0.5 MG tablet TAKE ONE HALF TO ONE TABLET BY MOUTH TWICE DAILY AS NEEDED FOR ANXIETY   metoprolol succinate (TOPROL-XL) 25 MG 24 hr tablet TAKE ONE TABLET BY MOUTH DAILY   MIEBO 1.338 GM/ML SOLN Apply 1 drop to eye daily.   Multiple Vitamins-Minerals (PRESERVISION AREDS 2 PO) Take by mouth daily.   mycophenolate (CELLCEPT) 500 MG tablet Take 1 tablet (500 mg total) by mouth 2 (two) times daily.   ondansetron (ZOFRAN) 4 MG tablet Take 1 tablet (4 mg total) by mouth 3 (three) times daily as needed for nausea or vomiting.   predniSONE (DELTASONE) 1 MG tablet Take 4 tablets (4 mg total) by mouth daily with breakfast. (Patient taking differently: Take 3 mg by mouth daily with breakfast.)   propranolol (INDERAL) 10 MG tablet Take 1 tablet (10 mg total) by mouth 3 (three) times daily. daily as needed (for breakthrough atrial fibrillation)   pyridostigmine  (MESTINON) 60 MG tablet Take 1 tablet at 7 AM, 1 tablet at 1 PM and 1 tablet at 7 PM (Patient taking differently: 60 mg. Take 1 tablet at 7 AM, 1 tablet at 1 PM and 1 tablet ------at 7 PM  60 mg at 7 and 7 03/01/24)   rosuvastatin (CRESTOR) 5 MG tablet TAKE ONE TABLET BY MOUTH ONCE A DAY   Trolamine Salicylate (ASPERCREME EX) Apply topically as needed.  valsartan (DIOVAN) 80 MG tablet TAKE HALF A TABLET BY MOUTH DAILY   XARELTO 20 MG TABS tablet TAKE ONE TABLET BY MOUTH ONCE A DAY WITH THE EVENING MEAL.   [DISCONTINUED] lansoprazole (PREVACID) 15 MG capsule TAKE ONE CAPSULE BY MOUTH DAILY AT 12 (NOON)   [DISCONTINUED] LORazepam (ATIVAN) 0.5 MG tablet TAKE ONE HALF TO 1 TABLET BY MOUTH TWICEDAILY AS NEEDED FOR ANXIETY   No facility-administered encounter medications on file as of 03/01/2024.    PAST MEDICAL HISTORY: Past Medical History:  Diagnosis Date   Adenomatous colon polyp    Allergic rhinitis due to pollen    Anemia    Anxiety    Bronchiectasis (HCC)    Complication of anesthesia    Degenerative disc disease    cervical and lumbar   Dysrhythmia    GERD (gastroesophageal reflux disease)    Hearing loss in right ear    Heart murmur    HOH (hard of hearing)    AIDS   Hyperlipidemia    Hypertension    Osteoarthritis, multiple sites    PAF (paroxysmal atrial fibrillation) (HCC)    a. 01/2013 Echo [facility]): EF 60-65%, no rwma, mildly dil LA/RA, mild AI/TR, trace MR, nl RV size/fxn, mild PAH; b. CHA2DS2VASc = 4-->Chronic Xarelto.   PMR (polymyalgia rheumatica) (HCC)    PONV (postoperative nausea and vomiting)    Rheumatic fever    Sleep apnea    NO CPAP   Urge incontinence    Vertigo    Vertigo     PAST SURGICAL HISTORY: Past Surgical History:  Procedure Laterality Date   ABDOMINAL HYSTERECTOMY     ANKLE FRACTURE SURGERY Left 1996   BACK SURGERY     BREAST BIOPSY Left 2010   negative   CATARACT EXTRACTION W/PHACO Left 10/05/2018   Procedure: CATARACT  EXTRACTION PHACO AND INTRAOCULAR LENS PLACEMENT (IOC);  Surgeon: Galen Manila, MD;  Location: [facility] ORS;  Service: Ophthalmology;  Laterality: Left;  [place] 00:40 CDE 6.92 fluid pack lot # [id number]      CATARACT EXTRACTION W/PHACO Right 10/26/2018   Procedure: CATARACT EXTRACTION PHACO AND INTRAOCULAR LENS PLACEMENT (IOC);  Surgeon: Galen Manila, MD;  Location: [facility] ORS;  Service: Ophthalmology;  Laterality: Right;  [place]  00:47 CDE 7.11 Fluid pack lot # [id number]   DEXA  11/09/06   normal   FRACTURE SURGERY     Meningitis  1963   SPINE SURGERY     SYNOVECTOMY Right 1973   elbow   TONSILLECTOMY AND ADENOIDECTOMY      ALLERGIES: Allergies  Allergen Reactions   Actonel [Risedronate Sodium] Nausea And Vomiting   Boniva [Ibandronate] Nausea And Vomiting   Diclofenac Other (See Comments)    Voltaren Gel aggravated her sinus passages   Fosamax [Alendronate Sodium] Nausea And Vomiting   Lipitor [Atorvastatin] Other (See Comments)    Muscle aches. Tolerates Crestor.   Miacalcin [Calcitonin ([place])] Other (See Comments)    Sneezing    Myrbetriq [Mirabegron] Other (See Comments)    Gastritis   Sertraline Hcl Other (See Comments)    Irritable, etc   Talwin [Pentazocine] Other (See Comments)    Hallucinations    Latex Rash    Sneezing, watery eyes.   Niaspan [Niacin Er (Antihyperlipidemic)] Swelling and Rash    FAMILY HISTORY: Family History  Problem Relation Age of Onset   Schizophrenia Mother        paranoid   Heart disease Mother        from psych  meds   Arthritis Mother    Rheum arthritis Mother    Cancer Father        prostate   Arthritis Father    Diabetes Father    Breast cancer Paternal Aunt 6   Ovarian cancer Maternal Grandmother    Uterine cancer Maternal Grandmother    Colon cancer Maternal Grandmother    Healthy Son     SOCIAL HISTORY: Social History   Tobacco Use   Smoking status: Never    Passive exposure: Never   Smokeless tobacco: Never   Vaping Use   Vaping status: Never Used  Substance Use Topics   Alcohol use: Not Currently   Drug use: No   Social History   Social History Narrative   Retired 2007    1 son in [place] area   Spends part time in [place]      Has living will   Son is health care POA   Not sure about DNR---will keep open resuscitation for now    No tube feedings if cognitively unaware   Right handed                Objective:  Vital Signs:  Ht 5\' 6"  (1.676 m)   BMI 24.66 kg/m   General: General appearance: Awake and alert. No distress. Cooperative with exam.  Skin: No obvious rash or jaundice. HEENT: Atraumatic. Anicteric. Lungs: Non-labored breathing on room air  Extremities: No edema. No obvious deformity.   Neurological: Mental Status: Alert. Speech fluent. No pseudobulbar affect Cranial Nerves: CNII: No RAPD. Visual fields intact. CNIII, IV, VI: PERRL. No nystagmus. EOMI. No diplopia with sustained upgaze CN V: Facial sensation intact bilaterally to fine touch. CN VII: Facial muscles symmetric and strong. No ptosis at rest or after sustained upgaze. CN VIII: Hears finger rub well bilaterally. CN IX: No hypophonia. CN X: Palate elevates symmetrically. CN XI: Full strength shoulder shrug bilaterally. CN XII: Tongue protrusion full and midline. No atrophy or fasciculations. No significant dysarthria Motor: Tone is normal. Strength is 5/5 in bilateral upper and lower extremities Reflexes:  Right Left  Bicep 2+ 2+  Tricep 2+ 2+  BrRad 2+ 2+  Knee 1+ 1+  Ankle 1+ 1+   Sensation: Pinprick: Intact in all extremities except in tips of all fingers (bilaterally) which is mildly diminished Coordination: Intact finger-to- nose-finger bilaterally Gait: Walks with walker. Stooped, slow steps. Narrow based.   Lab and Test Review: New results: 02/28/24: CBC w/ diff unremarkable BMP unremarkable  09/11/23: TSH wnl Free T4 wnl CMP unremarkable  Previously reviewed  results: CBC w/ diff (05/01/23): unremarkable  03/13/23: CMP: unremarkable TSH: 1.963   10/08/22: CBC and CMP wnl   08/27/22: Normal or unremarkable: CBC, CMP, TSH   08/15/22: Normal ESR and CRP   AChR binding ab: positive at 28.10   CT chest (05/15/22): FINDINGS: Cardiovascular: Moderate aortic atherosclerosis. No aneurysm. Coronary vascular calcification. Normal cardiac size. No pericardial effusion   Mediastinum/Nodes: Midline trachea. No thyroid mass. Subcarinal node measuring 12 mm. Esophagus is normal.   Lungs/Pleura: Mild bronchiectasis in the right middle lobe and bilateral lower lobes with scarring. No acute airspace disease, pleural effusion or pneumothorax.   Upper Abdomen: No acute abnormality.   Musculoskeletal: No chest wall abnormality. No acute or significant osseous findings.   IMPRESSION: 1. Negative for mediastinal mass lesion. 2. Mild bronchiectasis in the right middle and bilateral lower lobes with pulmonary scarring  ASSESSMENT: This is Jennifer Schmitt, a 89 y.o. female  with AChR ab positive generalized myasthenia gravis (symptom onset 12/2021 with diagnosis 04/28/2022). CT chest on 05/13/22 with no mediastinal mass. She currently has minimal MG symptoms despite being off prednisone for one month. She may have mild symptoms where she previously had none. There has been a lot of stress, an infection, and back pain that may also be contributing to mild worsening. She has had a recent increased in back pain, likely due to discovered L4 compression fracture in ED.   Plan: -CBC w/ diff and CMP every 6 months. Will check ~08/2024 -Continue Cellcept 500 mg BID -Restart prednisone at 5 mg -Patient instructed to MG symptoms worsen and call the office. -Mestinon 60 mg as needed -Continue staying active -Discussed MG crisis, what to watch for, and when to go to nearest ED including difficulty breathing or swallowing  -Referral to spine surgery - call social worker  at [facility] Jenean Lindau [phone]  Return to clinic in 3 months  Total time spent reviewing records, interview, history/exam, documentation, and coordination of care on day of encounter:  50 min  Jacquelyne Balint, MD

## 2024-02-22 ENCOUNTER — Other Ambulatory Visit: Payer: Self-pay | Admitting: Neurology

## 2024-02-22 DIAGNOSIS — M5442 Lumbago with sciatica, left side: Secondary | ICD-10-CM | POA: Diagnosis not present

## 2024-02-22 DIAGNOSIS — R300 Dysuria: Secondary | ICD-10-CM | POA: Diagnosis not present

## 2024-02-22 DIAGNOSIS — M5441 Lumbago with sciatica, right side: Secondary | ICD-10-CM | POA: Diagnosis not present

## 2024-02-22 DIAGNOSIS — N390 Urinary tract infection, site not specified: Secondary | ICD-10-CM | POA: Diagnosis not present

## 2024-02-23 ENCOUNTER — Other Ambulatory Visit: Payer: Self-pay | Admitting: Internal Medicine

## 2024-02-23 NOTE — Telephone Encounter (Signed)
 Last filled 07-09-23 #30 Last OV 12-08-23 No Future OV [facility]

## 2024-02-24 DIAGNOSIS — J300 Vasomotor rhinitis: Secondary | ICD-10-CM | POA: Diagnosis not present

## 2024-02-24 DIAGNOSIS — J381 Polyp of vocal cord and larynx: Secondary | ICD-10-CM | POA: Diagnosis not present

## 2024-02-24 DIAGNOSIS — H9201 Otalgia, right ear: Secondary | ICD-10-CM | POA: Diagnosis not present

## 2024-02-28 ENCOUNTER — Emergency Department

## 2024-02-28 ENCOUNTER — Encounter: Payer: Self-pay | Admitting: Radiology

## 2024-02-28 ENCOUNTER — Emergency Department
Admission: EM | Admit: 2024-02-28 | Discharge: 2024-02-28 | Disposition: A | Discharge status: Home / Self Care | Attending: Emergency Medicine | Admitting: Emergency Medicine

## 2024-02-28 DIAGNOSIS — I10 Essential (primary) hypertension: Secondary | ICD-10-CM | POA: Insufficient documentation

## 2024-02-28 DIAGNOSIS — S32040A Wedge compression fracture of fourth lumbar vertebra, initial encounter for closed fracture: Secondary | ICD-10-CM

## 2024-02-28 DIAGNOSIS — R1032 Left lower quadrant pain: Secondary | ICD-10-CM | POA: Diagnosis not present

## 2024-02-28 DIAGNOSIS — M51369 Other intervertebral disc degeneration, lumbar region without mention of lumbar back pain or lower extremity pain: Secondary | ICD-10-CM | POA: Diagnosis not present

## 2024-02-28 DIAGNOSIS — X58XXXA Exposure to other specified factors, initial encounter: Secondary | ICD-10-CM | POA: Insufficient documentation

## 2024-02-28 DIAGNOSIS — K5730 Diverticulosis of large intestine without perforation or abscess without bleeding: Secondary | ICD-10-CM | POA: Diagnosis not present

## 2024-02-28 DIAGNOSIS — R109 Unspecified abdominal pain: Secondary | ICD-10-CM

## 2024-02-28 DIAGNOSIS — Z7901 Long term (current) use of anticoagulants: Secondary | ICD-10-CM | POA: Diagnosis not present

## 2024-02-28 DIAGNOSIS — M48061 Spinal stenosis, lumbar region without neurogenic claudication: Secondary | ICD-10-CM | POA: Diagnosis not present

## 2024-02-28 DIAGNOSIS — M4856XA Collapsed vertebra, not elsewhere classified, lumbar region, initial encounter for fracture: Secondary | ICD-10-CM | POA: Diagnosis not present

## 2024-02-28 DIAGNOSIS — N12 Tubulo-interstitial nephritis, not specified as acute or chronic: Secondary | ICD-10-CM | POA: Diagnosis not present

## 2024-02-28 DIAGNOSIS — R29890 Loss of height: Secondary | ICD-10-CM | POA: Diagnosis not present

## 2024-02-28 DIAGNOSIS — S32048A Other fracture of fourth lumbar vertebra, initial encounter for closed fracture: Secondary | ICD-10-CM | POA: Insufficient documentation

## 2024-02-28 DIAGNOSIS — R10A2 Flank pain, left side: Secondary | ICD-10-CM

## 2024-02-28 DIAGNOSIS — N3289 Other specified disorders of bladder: Secondary | ICD-10-CM | POA: Diagnosis not present

## 2024-02-28 DIAGNOSIS — M549 Dorsalgia, unspecified: Secondary | ICD-10-CM | POA: Diagnosis not present

## 2024-02-28 LAB — URINALYSIS, W/ REFLEX TO CULTURE (INFECTION SUSPECTED)
Bilirubin Urine: NEGATIVE
Glucose, UA: NEGATIVE mg/dL
Ketones, ur: NEGATIVE mg/dL
Leukocytes,Ua: NEGATIVE
Nitrite: NEGATIVE
Protein, ur: NEGATIVE mg/dL
Specific Gravity, Urine: 1.01 (ref 1.005–1.030)
pH: 8 (ref 5.0–8.0)

## 2024-02-28 LAB — CBC WITH DIFFERENTIAL/PLATELET
Abs Immature Granulocytes: 0.04 10*3/uL (ref 0.00–0.07)
Basophils Absolute: 0 10*3/uL (ref 0.0–0.1)
Basophils Relative: 1 %
Eosinophils Absolute: 0.1 10*3/uL (ref 0.0–0.5)
Eosinophils Relative: 2 %
HCT: 36.7 % (ref 36.0–46.0)
Hemoglobin: 12.3 g/dL (ref 12.0–15.0)
Immature Granulocytes: 1 %
Lymphocytes Relative: 19 %
Lymphs Abs: 1 10*3/uL (ref 0.7–4.0)
MCH: 32.9 pg (ref 26.0–34.0)
MCHC: 33.5 g/dL (ref 30.0–36.0)
MCV: 98.1 fL (ref 80.0–100.0)
Monocytes Absolute: 0.5 10*3/uL (ref 0.1–1.0)
Monocytes Relative: 10 %
Neutro Abs: 3.7 10*3/uL (ref 1.7–7.7)
Neutrophils Relative %: 67 %
Platelets: 293 10*3/uL (ref 150–400)
RBC: 3.74 MIL/uL — ABNORMAL LOW (ref 3.87–5.11)
RDW: 13.3 % (ref 11.5–15.5)
WBC: 5.4 10*3/uL (ref 4.0–10.5)
nRBC: 0 % (ref 0.0–0.2)

## 2024-02-28 LAB — BASIC METABOLIC PANEL
Anion gap: 10 (ref 5–15)
BUN: 22 mg/dL (ref 8–23)
CO2: 27 mmol/L (ref 22–32)
Calcium: 9.2 mg/dL (ref 8.9–10.3)
Chloride: 102 mmol/L (ref 98–111)
Creatinine, Ser: 0.71 mg/dL (ref 0.44–1.00)
GFR, Estimated: >60 mL/min (ref >60)
Glucose, Bld: 92 mg/dL (ref 70–99)
Potassium: 3.8 mmol/L (ref 3.5–5.1)
Sodium: 139 mmol/L (ref 135–145)

## 2024-02-28 MED ORDER — SODIUM CHLORIDE 0.9 % IV SOLN
1.0000 g | Freq: Once | INTRAVENOUS | Status: AC
  Administered 2024-02-28: 1 g via INTRAVENOUS
  Filled 2024-02-28: qty 10

## 2024-02-28 MED ORDER — SODIUM CHLORIDE 0.9 % IV BOLUS
1000.0000 mL | Freq: Once | INTRAVENOUS | Status: AC
Start: 2024-02-28 — End: 2024-02-28
  Administered 2024-02-28: 1000 mL via INTRAVENOUS

## 2024-02-28 MED ORDER — PYRIDOSTIGMINE BROMIDE 60 MG PO TABS
60.0000 mg | ORAL_TABLET | Freq: Three times a day (TID) | ORAL | Status: DC
  Administered 2024-02-28 (×2): 60 mg via ORAL
  Filled 2024-02-28 (×3): qty 1

## 2024-02-28 MED ORDER — KETOROLAC TROMETHAMINE 15 MG/ML IJ SOLN
15.0000 mg | Freq: Once | INTRAMUSCULAR | Status: AC
  Administered 2024-02-28: 15 mg via INTRAVENOUS
  Filled 2024-02-28: qty 1

## 2024-02-28 MED ORDER — CEFDINIR 300 MG PO CAPS: 300.0000 mg | ORAL_CAPSULE | Freq: Two times a day (BID) | ORAL | 0 refills | Status: DC

## 2024-02-28 MED ORDER — MORPHINE SULFATE (PF) 2 MG/ML IV SOLN
2.0000 mg | Freq: Once | INTRAVENOUS | Status: DC
  Filled 2024-02-28: qty 1

## 2024-02-28 NOTE — ED Provider Notes (Addendum)
 [facility] Provider Note    Event Date/Time   First MD Initiated Contact with Patient 02/28/24 [phone]     (approximate)   History   Flank Pain   HPI  Jennifer Schmitt is a 89 y.o. female   Past medical history of atrial fibrillation on Xarelto, cervical and lumbar degenerative disc disease, hypertension hyperlipidemia, osteoarthritis, myasthenia gravis, and Polymyalgia rheumatica who presents to the Emergency Department with left-sided flank pain over the past couple of days as well as some mild dysuria.  Suprapubic discomfort as well.  No fever or chills.  The pain is quite significant and over the last 2 days she has had inability to walk.  She denies any saddle anesthesia, numbness or weakness to the legs.  She has never had kidney stones.  She says that she went to urgent care about 2 weeks ago with similar symptoms and was diagnosed with a urinary tract infection for which she took antibiotics and the symptoms resolved.  The symptoms have now recurred and are quite severe especially the pain in the left flank.  External Medical Documents Reviewed: Dr. Karle Starch note from December 2024 documenting her chronic health conditions and wellness visit      Physical Exam   Triage Vital Signs: ED Triage Vitals  Encounter Vitals Group     BP 02/28/24 0408 (!) 166/79     Systolic BP Percentile --      Diastolic BP Percentile --      Pulse Rate 02/28/24 0406 71     Resp 02/28/24 0406 20     Temp 02/28/24 0407 98.6 F (37 C)     Temp Source 02/28/24 0407 Oral     SpO2 02/28/24 0407 100 %     Weight 02/28/24 0406 152 lb 12.8 oz (69.3 kg)     Height 02/28/24 0406 5\' 6"  (1.676 m)     Head Circumference --      Peak Flow --      Pain Score 02/28/24 0406 10     Pain Loc --      Pain Education --      Exclude from Growth Chart --     Most recent vital signs: Vitals:   02/28/24 0407 02/28/24 0408  BP:  (!) 166/79  Pulse:    Resp:    Temp: 98.6 F (37  C)   SpO2: 100%     General: Awake, no distress.  CV:  Good peripheral perfusion.  Resp:  Normal effort.  Abd:  No distention.  Other:  She appears uncomfortable with significant left CVA tenderness.  Her abdomen is soft without distention or rigidity or guarding though she does note that there is some suprapubic discomfort when I palpate that area.  Her vital signs are normal and she is hypertensive but otherwise afebrile with a normal heart rate.  She is sensate to bilateral lower extremities and is able to range at the hip and knee with full active range of motion.  She has no saddle anesthesia.  Her L-spine has no step-off or deformity though is tender   ED Results / Procedures / Treatments   Labs (all labs ordered are listed, but only abnormal results are displayed) Labs Reviewed  CBC WITH DIFFERENTIAL/PLATELET - Abnormal; Notable for the following components:      Result Value   RBC 3.74 (*)    All other components within normal limits  URINALYSIS, W/ REFLEX TO CULTURE (INFECTION SUSPECTED) - Abnormal; Notable  for the following components:   Color, Urine YELLOW (*)    APPearance CLOUDY (*)    Hgb urine dipstick MODERATE (*)    Bacteria, UA RARE (*)    All other components within normal limits  URINE CULTURE  BASIC METABOLIC PANEL     I ordered and reviewed the above labs they are notable for cell counts and electrolytes unremarkable and renal function normal   RADIOLOGY I independently reviewed and interpreted CT scan of the abdomen pelvis see no obvious hydronephrosis or kidney stone I also reviewed radiologist's formal read.   PROCEDURES:  Critical Care performed: No  Procedures   MEDICATIONS ORDERED IN ED: Medications  morphine (PF) 2 MG/ML injection 2 mg (2 mg Intravenous Not Given 02/28/24 0508)  pyridostigmine (MESTINON) tablet 60 mg (has no administration in time range)  cefTRIAXone (ROCEPHIN) 1 g in sodium chloride 0.9 % 100 mL IVPB (has no administration  in time range)  ketorolac (TORADOL) 15 MG/ML injection 15 mg (15 mg Intravenous Given 02/28/24 0507)  sodium chloride 0.9 % bolus 1,000 mL (1,000 mLs Intravenous New Bag/Given 02/28/24 0511)    IMPRESSION / MDM / ASSESSMENT AND PLAN / ED COURSE  I reviewed the triage vital signs and the nursing notes.                                Patient's presentation is most consistent with acute presentation with potential threat to life or bodily function.  Differential diagnosis includes, but is not limited to, pyelonephritis, renal colic or kidney stone, lumbar fracture, cord compression, cauda equina   The patient is on the cardiac monitor to evaluate for evidence of arrhythmia and/or significant heart rate changes.  MDM:    She has had a recent urinary tract infection with recurrent left flank pain concerning for pyelonephritis or kidney stone.  She is quite tender in the area.  She denies any trauma but she does have some midline tenderness and a CT scan of the renal stone protocol does not show any intra-abdominal problems or kidney stone but does show an L4 compression fracture which may account for her pain.  It is read as acute or subacute.  She notes that she has had trouble walking in the last 2 days but no saddle anesthesia and incontinence.  She is able to range both lower extremities and is sensate.  Though she attributes the inability to walk due to the back pain, I am concerned that there may be spinal cord involvement given the retropulsion of the L4 compression fracture noted on CT scan, so we will proceed with MRI of the lumbar spine.   She is breathing comfortably has no respiratory complaints and her strength appears to be intact to her lower extremities.  I do not think she is having a myasthenic crisis.  --  MRI shows the previously identified L4 vertebral body compression fracture leading to moderate multifactorial spinal stenosis at the same level with no evidence of cord  compression or reports of cauda equina syndrome.    Urinalysis is pending.  The patient son arrives and notes that indeed the patient lives alone with some home health services and is otherwise ambulatory with walker and independent with activities of daily living but does suffer for some short-term memory issues.  She is at baseline cognition per son's report.  We spoke about disposition planning and the patient is adamant that she would like  to go home to be with her husband who is on hospice care.  She has evidence of urinary tract infection, I sent for culture and started her on IV dose of Rocephin and sent rx with 14-day course of cefdinir.  For her back pain she got Toradol with some relief of pain is refusing narcotic medications.  I spoke with Dr. Madaline Brilliant of neurosurgery who recommends LSO brace for comfort, mobilize as tolerated and they will arrange for outpatient follow-up for her L4 spine fracture.  She understands that she should have a ambulation trial and if she is unable to ambulate that she is amenable for staying in the hospital for PT/OT/rehab.        FINAL CLINICAL IMPRESSION(S) / ED DIAGNOSES   Final diagnoses:  Left flank pain  Closed compression fracture of L4 lumbar vertebra, initial encounter (HCC)  Pyelonephritis     Rx / DC Orders   ED Discharge Orders          Ordered    cefdinir (OMNICEF) 300 MG capsule  2 times daily        02/28/24 0724             Note:  This document was prepared using Dragon voice recognition software and may include unintentional dictation errors.    Pilar Jarvis, MD 02/28/24 0981    Pilar Jarvis, MD 02/28/24 1914    Pilar Jarvis, MD 02/28/24 7829    Pilar Jarvis, MD 02/28/24 [phone]

## 2024-02-28 NOTE — Progress Notes (Signed)
 Orthopedic Tech Progress Note Patient Details:  [name] 10-25-1935 [id number]  Patient ID: Jennifer Schmitt, female   DOB: December 23, 1934, 89 y.o.   MRN: [id number] LSO ordered [facility]. Darleen Crocker 02/28/2024, 8:22 AM

## 2024-02-28 NOTE — ED Notes (Signed)
 Pure wick in place

## 2024-02-28 NOTE — ED Notes (Signed)
 Changed suction canister. Placed used canister on counter.

## 2024-02-28 NOTE — ED Triage Notes (Signed)
 Pt BIB [facility] for flank pain that started 2 weeks ago. Pt reports burning with urination.

## 2024-02-28 NOTE — ED Provider Notes (Signed)
 Patient received in signout from Dr. Renaldo Reel.  Patient was able to ambulate with LSO brace.  She is motivated to be discharged home.   Willy Eddy, MD 02/28/24 1105

## 2024-02-28 NOTE — Discharge Instructions (Addendum)
 For kidney infection take antibiotics for the full course as prescribed.  Follow-up with your doctor to review the results of your urine culture for medication adjustments as needed.  For your lumbar spine fracture, use the brace provided to you for comfort.  Dr. Madaline Brilliant of neurosurgery spine clinic is expecting your phone call for follow-up appointment.  Thank you for choosing [place] for your health care today!  Please see your primary doctor this week for a follow up appointment.   If you have any new, worsening, or unexpected symptoms call your doctor right away or come back to the emergency department for reevaluation.  It was my pleasure to care for you today.   Daneil Dan Modesto Charon, MD

## 2024-02-28 NOTE — ED Notes (Signed)
 Pt had her back brace applied. Ambulation trial was completed with pt and no concerns or problems were voiced by pt.

## 2024-02-29 ENCOUNTER — Telehealth: Payer: Self-pay

## 2024-02-29 LAB — URINE CULTURE: Culture: NO GROWTH

## 2024-02-29 NOTE — Telephone Encounter (Signed)
 The ER called Dr Madaline Brilliant while he was covering call for our office on 02/28/24. Per Dr Madaline Brilliant:  "[phone] multiple medical problems p/w dysuria and left flank pain likely UTI/pyelo with L spine CT and MRI with L4 compression fracture 25% LOH min retropulsion neuro intact.    AP:  LSO brace for comfort, mobilize ad lib Will need outpatient followup."

## 2024-02-29 NOTE — Telephone Encounter (Signed)
 Please schedule a new patient appointment with a PA in 2-4 weeks with xrays that day. Thank you

## 2024-03-01 ENCOUNTER — Telehealth: Payer: Self-pay | Admitting: Neurology

## 2024-03-01 ENCOUNTER — Encounter: Payer: Self-pay | Admitting: Neurology

## 2024-03-01 ENCOUNTER — Ambulatory Visit: Admitting: Family Medicine

## 2024-03-01 ENCOUNTER — Ambulatory Visit: Payer: Medicare PPO | Admitting: Neurology

## 2024-03-01 ENCOUNTER — Encounter: Payer: Self-pay | Admitting: Family Medicine

## 2024-03-01 VITALS — Ht 66.0 in

## 2024-03-01 VITALS — BP 140/60 | HR 65 | Temp 98.4°F | Ht 66.5 in | Wt 160.5 lb

## 2024-03-01 DIAGNOSIS — M8000XA Age-related osteoporosis with current pathological fracture, unspecified site, initial encounter for fracture: Secondary | ICD-10-CM | POA: Insufficient documentation

## 2024-03-01 DIAGNOSIS — M545 Low back pain, unspecified: Secondary | ICD-10-CM | POA: Diagnosis not present

## 2024-03-01 DIAGNOSIS — G7000 Myasthenia gravis without (acute) exacerbation: Secondary | ICD-10-CM | POA: Diagnosis not present

## 2024-03-01 DIAGNOSIS — M542 Cervicalgia: Secondary | ICD-10-CM

## 2024-03-01 DIAGNOSIS — M5382 Other specified dorsopathies, cervical region: Secondary | ICD-10-CM | POA: Diagnosis not present

## 2024-03-01 DIAGNOSIS — S32040G Wedge compression fracture of fourth lumbar vertebra, subsequent encounter for fracture with delayed healing: Secondary | ICD-10-CM | POA: Diagnosis not present

## 2024-03-01 DIAGNOSIS — R8290 Unspecified abnormal findings in urine: Secondary | ICD-10-CM | POA: Insufficient documentation

## 2024-03-01 DIAGNOSIS — R269 Unspecified abnormalities of gait and mobility: Secondary | ICD-10-CM | POA: Diagnosis not present

## 2024-03-01 DIAGNOSIS — M6281 Muscle weakness (generalized): Secondary | ICD-10-CM | POA: Diagnosis not present

## 2024-03-01 DIAGNOSIS — G8929 Other chronic pain: Secondary | ICD-10-CM

## 2024-03-01 DIAGNOSIS — H02403 Unspecified ptosis of bilateral eyelids: Secondary | ICD-10-CM | POA: Diagnosis not present

## 2024-03-01 DIAGNOSIS — S32040A Wedge compression fracture of fourth lumbar vertebra, initial encounter for closed fracture: Secondary | ICD-10-CM | POA: Insufficient documentation

## 2024-03-01 MED ORDER — TRAMADOL HCL 50 MG PO TABS: 25.0000 mg | ORAL_TABLET | Freq: Three times a day (TID) | ORAL | 0 refills | Status: AC | PRN

## 2024-03-01 MED ORDER — PREDNISONE 5 MG PO TABS: 5.0000 mg | ORAL_TABLET | Freq: Every day | ORAL | 3 refills | Status: DC

## 2024-03-01 NOTE — Telephone Encounter (Signed)
 Patient was seen today and was told to follow up with [facility] and they called to get an appt. [facility] states that they do not have a referral and needs one before the patient can be seen . Please send a referral and put it to [employer].   [facility] would need to call [facility] at [phone] to sch the appt

## 2024-03-01 NOTE — Assessment & Plan Note (Signed)
 In the ER urinalysis was abnormal but urine culture returned clear.  No evidence of pyelonephritis.  She did have some improvement in the burning with urination after antibiotic injection recommended patient complete 7 days of the antibiotics but then stop.

## 2024-03-01 NOTE — Patient Instructions (Addendum)
 -Continue Cellcept 500 mg twice daily -Restart prednisone at 5 mg daily -Mestinon 60 mg as needed up to 3 times daily  Go to nearest emergency room if you have severe weakness, difficulty breathing or swallowing as this could be a myasthenic crisis (flare).  I am referring you to spine surgery for L4 compression fracture. Let me know if you do not hear from someone.  The physicians and staff at [facility] are committed to providing excellent care. You may receive a survey requesting feedback about your experience at our office. We strive to receive "very good" responses to the survey questions. If you feel that your experience would prevent you from giving the office a "very good " response, please contact our office to try to remedy the situation. We may be reached at [phone]. Thank you for taking the time out of your busy day to complete the survey.  Jacquelyne Balint, MD [facility]  Preventing Falls at [facility] are common, often dreaded events in the lives of older people. Aside from the obvious injuries and even death that may result, fall can cause wide-ranging consequences including loss of independence, mental decline, decreased activity and mobility. Younger people are also at risk of falling, especially those with chronic illnesses and fatigue.  Ways to reduce risk for falling Examine diet and medications. Warm foods and alcohol dilate blood vessels, which can lead to dizziness when standing. Sleep aids, antidepressants and pain medications can also increase the likelihood of a fall.  Get a vision exam. Poor vision, cataracts and glaucoma increase the chances of falling.  Check foot gear. Shoes should fit snugly and have a sturdy, nonskid sole and a broad, low heel  Participate in a physician-approved exercise program to build and maintain muscle strength and improve balance and coordination. Programs that use ankle weights or stretch bands are excellent for  muscle-strengthening. Water aerobics programs and low-impact Tai Chi programs have also been shown to improve balance and coordination.  Increase vitamin D intake. Vitamin D improves muscle strength and increases the amount of calcium the body is able to absorb and deposit in bones.  How to prevent falls from common hazards Floors - Remove all loose wires, cords, and throw rugs. Minimize clutter. Make sure rugs are anchored and smooth. Keep furniture in its usual place.  Chairs -- Use chairs with straight backs, armrests and firm seats. Add firm cushions to existing pieces to add height.  Bathroom - Install grab bars and non-skid tape in the tub or shower. Use a bathtub transfer bench or a shower chair with a back support Use an elevated toilet seat and/or safety rails to assist standing from a low surface. Do not use towel racks or bathroom tissue holders to help you stand.  Lighting - Make sure halls, stairways, and entrances are well-lit. Install a night light in your bathroom or hallway. Make sure there is a light switch at the top and bottom of the staircase. Turn lights on if you get up in the middle of the night. Make sure lamps or light switches are within reach of the bed if you have to get up during the night.  Kitchen - Install non-skid rubber mats near the sink and stove. Clean spills immediately. Store frequently used utensils, pots, pans between waist and eye level. This helps prevent reaching and bending. Sit when getting things out of lower cupboards.  Living room/ Bedrooms - Place furniture with wide spaces in between, giving enough room to  move around. Establish a route through the living room that gives you something to hold onto as you walk.  Stairs - Make sure treads, rails, and rugs are secure. Install a rail on both sides of the stairs. If stairs are a threat, it might be helpful to arrange most of your activities on the lower level to reduce the number of times you must climb  the stairs.  Entrances and doorways - Install metal handles on the walls adjacent to the doorknobs of all doors to make it more secure as you travel through the doorway.  Tips for maintaining balance Keep at least one hand free at all times. Try using a backpack or fanny pack to hold things rather than carrying them in your hands. Never carry objects in both hands when walking as this interferes with keeping your balance.  Attempt to swing both arms from front to back while walking. This might require a conscious effort if Parkinson's disease has diminished your movement. It will, however, help you to maintain balance and posture, and reduce fatigue.  Consciously lift your feet off of the ground when walking. Shuffling and dragging of the feet is a common culprit in losing your balance.  When trying to navigate turns, use a "U" technique of facing forward and making a wide turn, rather than pivoting sharply.  Try to stand with your feet shoulder-length apart. When your feet are close together for any length of time, you increase your risk of losing your balance and falling.  Do one thing at a time. Don't try to walk and accomplish another task, such as reading or looking around. The decrease in your automatic reflexes complicates motor function, so the less distraction, the better.  Do not wear rubber or gripping soled shoes, they might "catch" on the floor and cause tripping.  Move slowly when changing positions. Use deliberate, concentrated movements and, if needed, use a grab bar or walking aid. Count 15 seconds between each movement. For example, when rising from a seated position, wait 15 seconds after standing to begin walking.  If balance is a continuous problem, you might want to consider a walking aid such as a cane, walking stick, or walker. Once you've mastered walking with help, you might be ready to try it on your own again.

## 2024-03-01 NOTE — Telephone Encounter (Signed)
 Call number and let the Social worker know that the process take a few days. Message was left.

## 2024-03-01 NOTE — Patient Instructions (Addendum)
 Complete total of 7 day of antibiotics and then stop given urine culture negative,.

## 2024-03-01 NOTE — Assessment & Plan Note (Signed)
 New, no past history of osteoporosis in her chart.  Presumed new diagnosis given compression fracture. Briefly discussed need for treatment.  She will discuss further with her PCP at upcoming office visit next week.

## 2024-03-01 NOTE — Telephone Encounter (Signed)
 Nurse call

## 2024-03-01 NOTE — Assessment & Plan Note (Signed)
 Acute, pain poorly controlled.  She did see her neurologist this morning who is restarting her prednisone for myasthenia gravis.  This should help with pain and inflammation in her low back.  She is overusing Tylenol and we discussed not using Tylenol more than every 6 hours with a max dose of 4000 mg daily. I have given her tramadol to use for breakthrough pain especially at bed given her sleep is interrupted from the pain.  Her son is in the process of setting up of referral to neurosurgery.  We briefly discussed vertebroplasty as an option but she does have evidence of central spinal cord compression so she may need alternate treatment options.

## 2024-03-01 NOTE — Telephone Encounter (Signed)
 [facility] Social workers and [facility] would like Dr. Loleta Chance to know the last nuero Dr. Could not assist with Compression fracture on L4 and would like a recommendation provided to Pt. And son on todays visit and she would sched the Pt too any recommended appt, just give her a call

## 2024-03-01 NOTE — Progress Notes (Signed)
 Patient ID: Jennifer Schmitt, female    DOB: 1935/05/26, 89 y.o.   MRN: [id number]  This visit was conducted in person.  BP (!) 140/60 (BP Location: Left Arm, Patient Position: Sitting, Cuff Size: Large)   Pulse 65   Temp 98.4 F (36.9 C) (Temporal)   Ht 5' 6.5" (1.689 m)   Wt 160 lb 8 oz (72.8 kg)   SpO2 97%   BMI 25.52 kg/m    CC:  Chief Complaint  Patient presents with   Hospitalization Follow-up    Subjective:   HPI: Jennifer Schmitt is a 89 y.o. femalepatient of Dr. Alphonsus Sias with  history of atrial fibrillation on Xarelto, cervical and lumbar degenerative disc disease, hypertension hyperlipidemia, osteoarthritis, myasthenia gravis, and Polymyalgia rheumatica  presenting on 03/01/2024 for Hospitalization Follow-up  Reviewed recent ED note from February 28, 2024 for left sided flank pain and mild dysuria. Midline back pain noted on palpitation. CT scan of renal showed no intra-abdominal problems or kidney stone but did show L4 compression fracture Given she is unable to walk due to the pain ED doctor felt there could be spinal cord involvement given retropulsion of L4 compression fracture seen on CT scan so MRI lumbar spine was performed.   MRI shows the previously identified L4 vertebral body compression fracture leading to moderate multifactorial spinal stenosis at the same level with no evidence of cord compression or reports of cauda equina syndrome.   Neurosurgery consulted who recommended LSO brace for comfort and mobilization.  Recommended outpatient follow-up. UA suggested UTI.  Given IV Rocephin and sent home with 14-day course of cefdinir Cultures showed no growth.    Today she presents to the clinic with her son.  She reports resolution of dysuria .  No fever. No abdominal pain.  Using  lidocaine patch.  Using tylenol  1000 mg every 4 hours for pain.Marland Kitchen told too much.   Saw neurologist this AM for MG.... no weakness or new numbness in legs.  She is restarting  prednisone  5 mg daily  Son working on setting up appt with neurosurgery.  Relevant past medical, surgical, family and social history reviewed and updated as indicated. Interim medical history since our last visit reviewed. Allergies and medications reviewed and updated. Outpatient Medications Prior to Visit  Medication Sig Dispense Refill   acetaminophen (TYLENOL) 500 MG tablet Take 1,000 mg by mouth as needed.     amiodarone (PACERONE) 200 MG tablet Take 1 tablet (200 mg total) by mouth every other day. 90 tablet 0   BD SYRINGE SLIP TIP 25G X 5/8" 1 ML MISC      calcium carbonate (CALCIUM 600) 600 MG TABS tablet Take 1 tablet (600 mg total) by mouth daily. 30 tablet 0   cefdinir (OMNICEF) 300 MG capsule Take 1 capsule (300 mg total) by mouth 2 (two) times daily for 14 days. 28 capsule 0   Cholecalciferol (VITAMIN D) 2000 units CAPS Take 2,000 Units by mouth daily.     cyanocobalamin (VITAMIN B12) 1000 MCG/ML injection INJECT ONE ML MONTHLY AS INSTRUCTED 1 mL 11   diltiazem (CARDIZEM) 30 MG tablet Take 1 tablet (30 mg total) by mouth 3 (three) times daily as needed (atrial fibrillation lasting more than 2 hours). 60 tablet 3   fluticasone (FLONASE) 50 MCG/ACT nasal spray Place 1 spray into both nostrils daily.     folic acid (FOLVITE) 1 MG tablet TAKE ONE TABLET BY MOUTH DAILY 90 tablet 3   hydrocortisone 2.5 %  cream Apply topically 3 (three) times daily as needed. 28 g 3   lansoprazole (PREVACID) 15 MG capsule TAKE ONE CAPSULE BY MOUTH DAILY AT 12 (NOON) 30 capsule 2   LORazepam (ATIVAN) 0.5 MG tablet TAKE ONE HALF TO ONE TABLET BY MOUTH TWICE DAILY AS NEEDED FOR ANXIETY 30 tablet 0   metoprolol succinate (TOPROL-XL) 25 MG 24 hr tablet TAKE ONE TABLET BY MOUTH DAILY 90 tablet 3   MIEBO 1.338 GM/ML SOLN Apply 1 drop to eye daily.     Multiple Vitamins-Minerals (PRESERVISION AREDS 2 PO) Take by mouth daily.     mycophenolate (CELLCEPT) 500 MG tablet Take 1 tablet (500 mg total) by mouth 2  (two) times daily. 60 tablet 11   ondansetron (ZOFRAN) 4 MG tablet Take 1 tablet (4 mg total) by mouth 3 (three) times daily as needed for nausea or vomiting. 30 tablet 0   predniSONE (DELTASONE) 5 MG tablet Take 1 tablet (5 mg total) by mouth daily with breakfast. 90 tablet 3   propranolol (INDERAL) 10 MG tablet Take 1 tablet (10 mg total) by mouth 3 (three) times daily. daily as needed (for breakthrough atrial fibrillation) 60 tablet 1   pyridostigmine (MESTINON) 60 MG tablet Take 1 tablet at 7 AM, 1 tablet at 1 PM and 1 tablet at 7 PM (Patient taking differently: 60 mg. Take 1 tablet at 7 AM, 1 tablet at 1 PM and 1 tablet ------at 7 PM  60 mg at 7 and 7 03/01/24) 90 tablet 5   rosuvastatin (CRESTOR) 5 MG tablet TAKE ONE TABLET BY MOUTH ONCE A DAY 90 tablet 3   Trolamine Salicylate (ASPERCREME EX) Apply topically as needed.     valsartan (DIOVAN) 80 MG tablet TAKE HALF A TABLET BY MOUTH DAILY 135 tablet 0   XARELTO 20 MG TABS tablet TAKE ONE TABLET BY MOUTH ONCE A DAY WITH THE EVENING MEAL. 90 tablet 1   No facility-administered medications prior to visit.     Per HPI unless specifically indicated in ROS section below Review of Systems  Constitutional:  Negative for fatigue and fever.  HENT:  Negative for congestion.   Eyes:  Negative for pain.  Respiratory:  Negative for cough and shortness of breath.   Cardiovascular:  Negative for chest pain, palpitations and leg swelling.  Gastrointestinal:  Negative for abdominal pain.  Genitourinary:  Negative for dysuria and vaginal bleeding.  Musculoskeletal:  Positive for back pain.  Neurological:  Negative for syncope, light-headedness and headaches.  Psychiatric/Behavioral:  Negative for dysphoric mood.    Objective:  BP (!) 140/60 (BP Location: Left Arm, Patient Position: Sitting, Cuff Size: Large)   Pulse 65   Temp 98.4 F (36.9 C) (Temporal)   Ht 5' 6.5" (1.689 m)   Wt 160 lb 8 oz (72.8 kg)   SpO2 97%   BMI 25.52 kg/m   Wt Readings  from Last 3 Encounters:  03/01/24 160 lb 8 oz (72.8 kg)  02/28/24 152 lb 12.8 oz (69.3 kg)  12/08/23 158 lb (71.7 kg)      Physical Exam Constitutional:      General: She is not in acute distress.    Appearance: Normal appearance. She is well-developed. She is not ill-appearing or toxic-appearing.  HENT:     Head: Normocephalic.     Right Ear: Hearing, tympanic membrane, ear canal and external ear normal. Tympanic membrane is not erythematous, retracted or bulging.     Left Ear: Hearing, tympanic membrane, ear canal and external  ear normal. Tympanic membrane is not erythematous, retracted or bulging.     Nose: No mucosal edema or rhinorrhea.     Right Sinus: No maxillary sinus tenderness or frontal sinus tenderness.     Left Sinus: No maxillary sinus tenderness or frontal sinus tenderness.     Mouth/Throat:     Pharynx: Uvula midline.  Eyes:     General: Lids are normal. Lids are everted, no foreign bodies appreciated.     Conjunctiva/sclera: Conjunctivae normal.     Pupils: Pupils are equal, round, and reactive to light.  Neck:     Thyroid: No thyroid mass or thyromegaly.     Vascular: No carotid bruit.     Trachea: Trachea normal.  Cardiovascular:     Rate and Rhythm: Normal rate and regular rhythm.     Pulses: Normal pulses.     Heart sounds: Normal heart sounds, S1 normal and S2 normal. No murmur heard.    No friction rub. No gallop.  Pulmonary:     Effort: Pulmonary effort is normal. No tachypnea or respiratory distress.     Breath sounds: Normal breath sounds. No decreased breath sounds, wheezing, rhonchi or rales.  Abdominal:     General: Bowel sounds are normal.     Palpations: Abdomen is soft.     Tenderness: There is no abdominal tenderness.  Musculoskeletal:     Cervical back: Normal range of motion and neck supple.     Lumbar back: Tenderness and bony tenderness present. Decreased range of motion. Negative right straight leg raise test and negative left straight  leg raise test.     Comments: Some pain in bilateral lateral hips and buttucks  Skin:    General: Skin is warm and dry.     Findings: No rash.  Neurological:     Mental Status: She is alert.  Psychiatric:        Mood and Affect: Mood is not anxious or depressed.        Speech: Speech normal.        Behavior: Behavior normal. Behavior is cooperative.        Thought Content: Thought content normal.        Judgment: Judgment normal.       Results for orders placed or performed during the hospital encounter of 02/28/24  Basic metabolic panel   Collection Time: 02/28/24  4:17 AM  Result Value Ref Range   Sodium 139 135 - 145 mmol/L   Potassium 3.8 3.5 - 5.1 mmol/L   Chloride 102 98 - 111 mmol/L   CO2 27 22 - 32 mmol/L   Glucose, Bld 92 70 - 99 mg/dL   BUN 22 8 - 23 mg/dL   Creatinine, Ser 4.09 0.44 - 1.00 mg/dL   Calcium 9.2 8.9 - 81.1 mg/dL   GFR, Estimated >91 >47 mL/min   Anion gap 10 5 - 15  CBC with Differential   Collection Time: 02/28/24  4:17 AM  Result Value Ref Range   WBC 5.4 4.0 - 10.5 K/uL   RBC 3.74 (L) 3.87 - 5.11 MIL/uL   Hemoglobin 12.3 12.0 - 15.0 g/dL   HCT 82.9 56.2 - 13.0 %   MCV 98.1 80.0 - 100.0 fL   MCH 32.9 26.0 - 34.0 pg   MCHC 33.5 30.0 - 36.0 g/dL   RDW 86.5 78.4 - 69.6 %   Platelets 293 150 - 400 K/uL   nRBC 0.0 0.0 - 0.2 %   Neutrophils Relative %  67 %   Neutro Abs 3.7 1.7 - 7.7 K/uL   Lymphocytes Relative 19 %   Lymphs Abs 1.0 0.7 - 4.0 K/uL   Monocytes Relative 10 %   Monocytes Absolute 0.5 0.1 - 1.0 K/uL   Eosinophils Relative 2 %   Eosinophils Absolute 0.1 0.0 - 0.5 K/uL   Basophils Relative 1 %   Basophils Absolute 0.0 0.0 - 0.1 K/uL   Immature Granulocytes 1 %   Abs Immature Granulocytes 0.04 0.00 - 0.07 K/uL  Urine Culture (for pregnant, neutropenic or urologic patients or patients with an indwelling urinary catheter)   Collection Time: 02/28/24  6:25 AM   Specimen: Urine, Clean Catch  Result Value Ref Range   Specimen  Description      URINE, CLEAN CATCH Performed at [facility], [address], [place], [place] [address]    Special Requests      NONE Performed at [facility], [address], [place], [place] [address]    Culture      NO GROWTH Performed at [facility], [address], [place], [place] [address]    Report Status 02/29/2024 FINAL   Urinalysis, w/ Reflex to Culture (Infection Suspected) -Urine, Clean Catch   Collection Time: 02/28/24  6:25 AM  Result Value Ref Range   Specimen Source URINE, CLEAN CATCH    Color, Urine YELLOW (A) YELLOW   APPearance CLOUDY (A) CLEAR   Specific Gravity, Urine 1.010 1.005 - 1.030   pH 8.0 5.0 - 8.0   Glucose, UA NEGATIVE NEGATIVE mg/dL   Hgb urine dipstick MODERATE (A) NEGATIVE   Bilirubin Urine NEGATIVE NEGATIVE   Ketones, ur NEGATIVE NEGATIVE mg/dL   Protein, ur NEGATIVE NEGATIVE mg/dL   Nitrite NEGATIVE NEGATIVE   Leukocytes,Ua NEGATIVE NEGATIVE   RBC / HPF 0-5 0 - 5 RBC/hpf   WBC, UA 11-20 0 - 5 WBC/hpf   Bacteria, UA RARE (A) NONE SEEN   Squamous Epithelial / HPF 0-5 0 - 5 /HPF    Assessment and Plan  Closed compression fracture of L4 lumbar vertebra with delayed healing, subsequent encounter Assessment & Plan: Acute, pain poorly controlled.  She did see her neurologist this morning who is restarting her prednisone for myasthenia gravis.  This should help with pain and inflammation in her low back.  She is overusing Tylenol and we discussed not using Tylenol more than every 6 hours with a max dose of 4000 mg daily. I have given her tramadol to use for breakthrough pain especially at bed given her sleep is interrupted from the pain.  Her son is in the process of setting up of referral to neurosurgery.  We briefly discussed vertebroplasty as an option but she does have evidence of central spinal cord compression so she may need alternate treatment options.   Age-related osteoporosis with current pathological  fracture, initial encounter Assessment & Plan: New, no past history of osteoporosis in her chart.  Presumed new diagnosis given compression fracture. Briefly discussed need for treatment.  She will discuss further with her PCP at upcoming office visit next week.   Abnormal urinalysis Assessment & Plan: In the ER urinalysis was abnormal but urine culture returned clear.  No evidence of pyelonephritis.  She did have some improvement in the burning with urination after antibiotic injection recommended patient complete 7 days of the antibiotics but then stop.   Other orders -     traMADol HCl; Take 0.5-1 tablets (25-50 mg total) by mouth  3 (three) times daily as needed for up to 5 days for severe pain (pain score 7-10).  Dispense: 15 tablet; Refill: 0    No follow-ups on file.   Kerby Nora, MD

## 2024-03-02 NOTE — Telephone Encounter (Signed)
 Patient's son called to inform us that the patient will be going to [facility]. Will not need an appointment at our office.

## 2024-03-02 NOTE — Telephone Encounter (Signed)
 Noted.

## 2024-03-02 NOTE — Telephone Encounter (Signed)
 Left message for Jennifer Schmitt [phone] pt's son to call the office.

## 2024-03-07 ENCOUNTER — Encounter: Payer: Self-pay | Admitting: Internal Medicine

## 2024-03-07 ENCOUNTER — Ambulatory Visit: Admitting: Internal Medicine

## 2024-03-07 VITALS — BP 130/64 | HR 71 | Temp 98.8°F | Ht 66.5 in | Wt 161.0 lb

## 2024-03-07 DIAGNOSIS — M8000XS Age-related osteoporosis with current pathological fracture, unspecified site, sequela: Secondary | ICD-10-CM

## 2024-03-07 DIAGNOSIS — M8000XD Age-related osteoporosis with current pathological fracture, unspecified site, subsequent encounter for fracture with routine healing: Secondary | ICD-10-CM

## 2024-03-07 MED ORDER — RISEDRONATE SODIUM 150 MG PO TABS: 150.0000 mg | ORAL_TABLET | ORAL | 3 refills | Status: AC

## 2024-03-07 NOTE — Assessment & Plan Note (Signed)
 Not sure the L4 fracture is clearly pathologic--but with chronic prednisone use, need to start bisphosphonate Will start actonel 150mg  monthly Already on calcium and vitamin D Discussed esophageal precautions and that it should not be taken over 5 years

## 2024-03-07 NOTE — Progress Notes (Signed)
 Subjective:    Patient ID: Jennifer Schmitt, female    DOB: 1935/07/23, 89 y.o.   MRN: [id number]  HPI Here with son for follow up after urinary symptoms---and lumbar fracture  No longer having any urinary symptoms Thinks acidic food like tomatoes will bring it on Symptoms did go away with the antibiotic  Had sudden back pain CT then MRI show acute or subacute L4 fracture Pain is better---wearing back brace and uses aspercreme Is going to spine specialist ---referred from neurologist (?for consideration of kyphoplasty??) Discussed that since her pain is better, no surgical intervention will be needed  Current Outpatient Medications on File Prior to Visit  Medication Sig Dispense Refill   acetaminophen (TYLENOL) 500 MG tablet Take 1,000 mg by mouth as needed.     amiodarone (PACERONE) 200 MG tablet Take 1 tablet (200 mg total) by mouth every other day. 90 tablet 0   BD SYRINGE SLIP TIP 25G X 5/8" 1 ML MISC      calcium carbonate (CALCIUM 600) 600 MG TABS tablet Take 1 tablet (600 mg total) by mouth daily. 30 tablet 0   Cholecalciferol (VITAMIN D) 2000 units CAPS Take 2,000 Units by mouth daily.     cyanocobalamin (VITAMIN B12) 1000 MCG/ML injection INJECT ONE ML MONTHLY AS INSTRUCTED 1 mL 11   diltiazem (CARDIZEM) 30 MG tablet Take 1 tablet (30 mg total) by mouth 3 (three) times daily as needed (atrial fibrillation lasting more than 2 hours). 60 tablet 3   fluticasone (FLONASE) 50 MCG/ACT nasal spray Place 1 spray into both nostrils daily.     folic acid (FOLVITE) 1 MG tablet TAKE ONE TABLET BY MOUTH DAILY 90 tablet 3   hydrocortisone 2.5 % cream Apply topically 3 (three) times daily as needed. 28 g 3   lansoprazole (PREVACID) 15 MG capsule TAKE ONE CAPSULE BY MOUTH DAILY AT 12 (NOON) 30 capsule 2   LORazepam (ATIVAN) 0.5 MG tablet TAKE ONE HALF TO ONE TABLET BY MOUTH TWICE DAILY AS NEEDED FOR ANXIETY 30 tablet 0   MIEBO 1.338 GM/ML SOLN Apply 1 drop to eye daily.     Multiple  Vitamins-Minerals (PRESERVISION AREDS 2 PO) Take by mouth daily.     mycophenolate (CELLCEPT) 500 MG tablet Take 1 tablet (500 mg total) by mouth 2 (two) times daily. 60 tablet 11   ondansetron (ZOFRAN) 4 MG tablet Take 1 tablet (4 mg total) by mouth 3 (three) times daily as needed for nausea or vomiting. 30 tablet 0   predniSONE (DELTASONE) 5 MG tablet Take 1 tablet (5 mg total) by mouth daily with breakfast. 90 tablet 3   propranolol (INDERAL) 10 MG tablet Take 1 tablet (10 mg total) by mouth 3 (three) times daily. daily as needed (for breakthrough atrial fibrillation) 60 tablet 1   rosuvastatin (CRESTOR) 5 MG tablet TAKE ONE TABLET BY MOUTH ONCE A DAY 90 tablet 3   Trolamine Salicylate (ASPERCREME EX) Apply topically as needed.     valsartan (DIOVAN) 80 MG tablet TAKE HALF A TABLET BY MOUTH DAILY 135 tablet 0   XARELTO 20 MG TABS tablet TAKE ONE TABLET BY MOUTH ONCE A DAY WITH THE EVENING MEAL. 90 tablet 1   metoprolol succinate (TOPROL-XL) 25 MG 24 hr tablet TAKE ONE TABLET BY MOUTH DAILY (Patient not taking: Reported on 03/07/2024) 90 tablet 3   pyridostigmine (MESTINON) 60 MG tablet Take 1 tablet at 7 AM, 1 tablet at 1 PM and 1 tablet at 7 PM (Patient taking  differently: 60 mg. Take 1 tablet at 7 AM, 1 tablet at 1 PM and 1 tablet ------at 7 PM  60 mg at 7 and 7 03/01/24) 90 tablet 5   No current facility-administered medications on file prior to visit.    Allergies  Allergen Reactions   Diltiazem Hcl Other (See Comments)    Drops blood pressure drastically.   Actonel [Risedronate Sodium] Nausea And Vomiting   Boniva [Ibandronate] Nausea And Vomiting   Diclofenac Other (See Comments)    Voltaren Gel aggravated her sinus passages   Fosamax [Alendronate Sodium] Nausea And Vomiting   Lipitor [Atorvastatin] Other (See Comments)    Muscle aches. Tolerates Crestor.   Miacalcin [Calcitonin ([place])] Other (See Comments)    Sneezing    Myrbetriq [Mirabegron] Other (See Comments)    Gastritis    Sertraline Hcl Other (See Comments)    Irritable, etc   Talwin [Pentazocine] Other (See Comments)    Hallucinations    Latex Rash    Sneezing, watery eyes.   Niaspan [Niacin Er (Antihyperlipidemic)] Swelling and Rash    Past Medical History:  Diagnosis Date   Adenomatous colon polyp    Allergic rhinitis due to pollen    Anemia    Anxiety    Bronchiectasis (HCC)    Complication of anesthesia    Degenerative disc disease    cervical and lumbar   Dysrhythmia    GERD (gastroesophageal reflux disease)    Hearing loss in right ear    Heart murmur    HOH (hard of hearing)    AIDS   Hyperlipidemia    Hypertension    Osteoarthritis, multiple sites    PAF (paroxysmal atrial fibrillation) (HCC)    a. 01/2013 Echo [facility]): EF 60-65%, no rwma, mildly dil LA/RA, mild AI/TR, trace MR, nl RV size/fxn, mild PAH; b. CHA2DS2VASc = 4-->Chronic Xarelto.   PMR (polymyalgia rheumatica) (HCC)    PONV (postoperative nausea and vomiting)    Rheumatic fever    Sleep apnea    NO CPAP   Urge incontinence    Vertigo    Vertigo     Past Surgical History:  Procedure Laterality Date   ABDOMINAL HYSTERECTOMY     ANKLE FRACTURE SURGERY Left 1996   BACK SURGERY     BREAST BIOPSY Left 2010   negative   CATARACT EXTRACTION W/PHACO Left 10/05/2018   Procedure: CATARACT EXTRACTION PHACO AND INTRAOCULAR LENS PLACEMENT (IOC);  Surgeon: Galen Manila, MD;  Location: [facility] ORS;  Service: Ophthalmology;  Laterality: Left;  [place] 00:40 CDE 6.92 fluid pack lot # [id number]      CATARACT EXTRACTION W/PHACO Right 10/26/2018   Procedure: CATARACT EXTRACTION PHACO AND INTRAOCULAR LENS PLACEMENT (IOC);  Surgeon: Galen Manila, MD;  Location: [facility] ORS;  Service: Ophthalmology;  Laterality: Right;  [place]  00:47 CDE 7.11 Fluid pack lot # [id number]   DEXA  11/09/06   normal   FRACTURE SURGERY     Meningitis  1963   SPINE SURGERY     SYNOVECTOMY Right 1973   elbow   TONSILLECTOMY AND ADENOIDECTOMY       Family History  Problem Relation Age of Onset   Schizophrenia Mother        paranoid   Heart disease Mother        from psych meds   Arthritis Mother    Rheum arthritis Mother    Cancer Father        prostate   Arthritis Father    Diabetes  Father    Breast cancer Paternal Aunt 63   Ovarian cancer Maternal Grandmother    Uterine cancer Maternal Grandmother    Colon cancer Maternal Grandmother    Healthy Son     Social History   Socioeconomic History   Marital status: Married    Spouse name: Not on file   Number of children: 1   Years of education: Not on file   Highest education level: Not on file  Occupational History   Occupation: Production designer, theatre/television/film and college professor    Comment: [employer]--PhD in Special education  Tobacco Use   Smoking status: Never    Passive exposure: Never   Smokeless tobacco: Never  Vaping Use   Vaping status: Never Used  Substance and Sexual Activity   Alcohol use: Not Currently   Drug use: No   Sexual activity: Not on file  Other Topics Concern   Not on file  Social History Narrative   Retired 2007    1 son in [place] area   Spends part time in [place]      Has living will   Son is health care POA   Not sure about DNR---will keep open resuscitation for now    No tube feedings if cognitively unaware   Right handed               Social Drivers of Corporate investment banker Strain: Not on file  Food Insecurity: Not on file  Transportation Needs: Not on file  Physical Activity: Not on file  Stress: Not on file  Social Connections: Not on file  Intimate Partner Violence: Not on file   Review of Systems     Objective:   Physical Exam Constitutional:      Appearance: Normal appearance.  Neurological:     Mental Status: She is alert.  Psychiatric:        Mood and Affect: Mood normal.        Behavior: Behavior normal.            Assessment & Plan:

## 2024-03-15 DIAGNOSIS — M6281 Muscle weakness (generalized): Secondary | ICD-10-CM | POA: Diagnosis not present

## 2024-03-15 DIAGNOSIS — G7001 Myasthenia gravis with (acute) exacerbation: Secondary | ICD-10-CM | POA: Diagnosis not present

## 2024-03-15 DIAGNOSIS — R278 Other lack of coordination: Secondary | ICD-10-CM | POA: Diagnosis not present

## 2024-03-15 DIAGNOSIS — S32000D Wedge compression fracture of unspecified lumbar vertebra, subsequent encounter for fracture with routine healing: Secondary | ICD-10-CM | POA: Diagnosis not present

## 2024-03-15 DIAGNOSIS — R2689 Other abnormalities of gait and mobility: Secondary | ICD-10-CM | POA: Diagnosis not present

## 2024-03-21 ENCOUNTER — Ambulatory Visit: Admitting: Internal Medicine

## 2024-03-21 ENCOUNTER — Encounter: Payer: Self-pay | Admitting: Internal Medicine

## 2024-03-21 VITALS — BP 120/60 | HR 71 | Temp 98.7°F | Ht 66.5 in | Wt 160.0 lb

## 2024-03-21 DIAGNOSIS — S32040D Wedge compression fracture of fourth lumbar vertebra, subsequent encounter for fracture with routine healing: Secondary | ICD-10-CM

## 2024-03-21 DIAGNOSIS — F39 Unspecified mood [affective] disorder: Secondary | ICD-10-CM

## 2024-03-21 DIAGNOSIS — R350 Frequency of micturition: Secondary | ICD-10-CM | POA: Diagnosis not present

## 2024-03-21 LAB — POC URINALSYSI DIPSTICK (AUTOMATED)
Bilirubin, UA: NEGATIVE
Glucose, UA: NEGATIVE
Ketones, UA: NEGATIVE
Nitrite, UA: NEGATIVE
Protein, UA: POSITIVE — AB
Spec Grav, UA: 1.02 (ref 1.010–1.025)
Urobilinogen, UA: 0.2 U/dL
pH, UA: 6 (ref 5.0–8.0)

## 2024-03-21 NOTE — Addendum Note (Signed)
 Addended by: Eual Fines on: 03/21/2024 02:47 PM   Modules accepted: Orders

## 2024-03-21 NOTE — Assessment & Plan Note (Signed)
 Symptoms not really consistent with infection---though urinalysis shows 3+ leuks (neg nitrite) Will send culture but hold off on treatment for now

## 2024-03-21 NOTE — Assessment & Plan Note (Addendum)
 Still just reactive depression and anticipatory grieving Discussed using counseling service through hospice Has lorazepam for prn SSRI meds not indicated

## 2024-03-21 NOTE — Assessment & Plan Note (Signed)
 Okay to stop the back brace Now on alendronate

## 2024-03-21 NOTE — Progress Notes (Signed)
 Subjective:    Patient ID: Jennifer Schmitt, female    DOB: 1935/06/20, 89 y.o.   MRN: [id number]  HPI Here with son with multiple concerns  Husband is in skilled care---on hospice care Having a hard time dealing with this--and other issues Does feel he is getting good care--but having trouble dealing with knowing he is not coming home Uses the lorazepam sparingly--takes 1/2 at a time (last night was last time) No thoughts of suicide or dying  Wondering about whether she still needs the back brace (had 25% lumbar vertebral fracture) No pain Discussed--she doesn't need the brace anymore  Rash at top of tailbone--mostly cleared up now No trauma but still slight pain  Having urinary frequency---then can't go Other times will just "flood" No dysuria No fever No N/V No blood in urine  Current Outpatient Medications on File Prior to Visit  Medication Sig Dispense Refill   acetaminophen (TYLENOL) 500 MG tablet Take 1,000 mg by mouth as needed.     amiodarone (PACERONE) 200 MG tablet Take 1 tablet (200 mg total) by mouth every other day. 90 tablet 0   BD SYRINGE SLIP TIP 25G X 5/8" 1 ML MISC      calcium carbonate (CALCIUM 600) 600 MG TABS tablet Take 1 tablet (600 mg total) by mouth daily. 30 tablet 0   Cholecalciferol (VITAMIN D) 2000 units CAPS Take 2,000 Units by mouth daily.     cyanocobalamin (VITAMIN B12) 1000 MCG/ML injection INJECT ONE ML MONTHLY AS INSTRUCTED 1 mL 11   diltiazem (CARDIZEM) 30 MG tablet Take 1 tablet (30 mg total) by mouth 3 (three) times daily as needed (atrial fibrillation lasting more than 2 hours). 60 tablet 3   fluticasone (FLONASE) 50 MCG/ACT nasal spray Place 1 spray into both nostrils daily.     folic acid (FOLVITE) 1 MG tablet TAKE ONE TABLET BY MOUTH DAILY 90 tablet 3   hydrocortisone 2.5 % cream Apply topically 3 (three) times daily as needed. 28 g 3   lansoprazole (PREVACID) 15 MG capsule TAKE ONE CAPSULE BY MOUTH DAILY AT 12 (NOON) 30 capsule 2    LORazepam (ATIVAN) 0.5 MG tablet TAKE ONE HALF TO ONE TABLET BY MOUTH TWICE DAILY AS NEEDED FOR ANXIETY 30 tablet 0   metoprolol succinate (TOPROL-XL) 25 MG 24 hr tablet TAKE ONE TABLET BY MOUTH DAILY 90 tablet 3   MIEBO 1.338 GM/ML SOLN Apply 1 drop to eye daily.     Multiple Vitamins-Minerals (PRESERVISION AREDS 2 PO) Take by mouth daily.     mycophenolate (CELLCEPT) 500 MG tablet Take 1 tablet (500 mg total) by mouth 2 (two) times daily. 60 tablet 11   ondansetron (ZOFRAN) 4 MG tablet Take 1 tablet (4 mg total) by mouth 3 (three) times daily as needed for nausea or vomiting. 30 tablet 0   predniSONE (DELTASONE) 5 MG tablet Take 1 tablet (5 mg total) by mouth daily with breakfast. 90 tablet 3   propranolol (INDERAL) 10 MG tablet Take 1 tablet (10 mg total) by mouth 3 (three) times daily. daily as needed (for breakthrough atrial fibrillation) 60 tablet 1   risedronate (ACTONEL) 150 MG tablet Take 1 tablet (150 mg total) by mouth every 30 (thirty) days. with water on empty stomach, nothing by mouth or lie down for next 30 minutes. 3 tablet 3   rosuvastatin (CRESTOR) 5 MG tablet TAKE ONE TABLET BY MOUTH ONCE A DAY 90 tablet 3   Trolamine Salicylate (ASPERCREME EX) Apply topically as  needed.     valsartan (DIOVAN) 80 MG tablet TAKE HALF A TABLET BY MOUTH DAILY 135 tablet 0   XARELTO 20 MG TABS tablet TAKE ONE TABLET BY MOUTH ONCE A DAY WITH THE EVENING MEAL. 90 tablet 1   No current facility-administered medications on file prior to visit.    Allergies  Allergen Reactions   Diltiazem Hcl Other (See Comments)    Drops blood pressure drastically.   Actonel [Risedronate Sodium] Nausea And Vomiting   Boniva [Ibandronate] Nausea And Vomiting   Diclofenac Other (See Comments)    Voltaren Gel aggravated her sinus passages   Fosamax [Alendronate Sodium] Nausea And Vomiting   Lipitor [Atorvastatin] Other (See Comments)    Muscle aches. Tolerates Crestor.   Miacalcin [Calcitonin ([place])] Other  (See Comments)    Sneezing    Myrbetriq [Mirabegron] Other (See Comments)    Gastritis   Sertraline Hcl Other (See Comments)    Irritable, etc   Talwin [Pentazocine] Other (See Comments)    Hallucinations    Latex Rash    Sneezing, watery eyes.   Niaspan [Niacin Er (Antihyperlipidemic)] Swelling and Rash    Past Medical History:  Diagnosis Date   Adenomatous colon polyp    Allergic rhinitis due to pollen    Anemia    Anxiety    Bronchiectasis (HCC)    Complication of anesthesia    Degenerative disc disease    cervical and lumbar   Dysrhythmia    GERD (gastroesophageal reflux disease)    Hearing loss in right ear    Heart murmur    HOH (hard of hearing)    AIDS   Hyperlipidemia    Hypertension    Osteoarthritis, multiple sites    PAF (paroxysmal atrial fibrillation) (HCC)    a. 01/2013 Echo [facility]): EF 60-65%, no rwma, mildly dil LA/RA, mild AI/TR, trace MR, nl RV size/fxn, mild PAH; b. CHA2DS2VASc = 4-->Chronic Xarelto.   PMR (polymyalgia rheumatica) (HCC)    PONV (postoperative nausea and vomiting)    Rheumatic fever    Sleep apnea    NO CPAP   Urge incontinence    Vertigo    Vertigo     Past Surgical History:  Procedure Laterality Date   ABDOMINAL HYSTERECTOMY     ANKLE FRACTURE SURGERY Left 1996   BACK SURGERY     BREAST BIOPSY Left 2010   negative   CATARACT EXTRACTION W/PHACO Left 10/05/2018   Procedure: CATARACT EXTRACTION PHACO AND INTRAOCULAR LENS PLACEMENT (IOC);  Surgeon: Galen Manila, MD;  Location: [facility] ORS;  Service: Ophthalmology;  Laterality: Left;  [place] 00:40 CDE 6.92 fluid pack lot # [id number]      CATARACT EXTRACTION W/PHACO Right 10/26/2018   Procedure: CATARACT EXTRACTION PHACO AND INTRAOCULAR LENS PLACEMENT (IOC);  Surgeon: Galen Manila, MD;  Location: [facility] ORS;  Service: Ophthalmology;  Laterality: Right;  [place]  00:47 CDE 7.11 Fluid pack lot # [id number]   DEXA  11/09/06   normal   FRACTURE SURGERY     Meningitis  1963    SPINE SURGERY     SYNOVECTOMY Right 1973   elbow   TONSILLECTOMY AND ADENOIDECTOMY      Family History  Problem Relation Age of Onset   Schizophrenia Mother        paranoid   Heart disease Mother        from psych meds   Arthritis Mother    Rheum arthritis Mother    Cancer Father  prostate   Arthritis Father    Diabetes Father    Breast cancer Paternal Aunt 57   Ovarian cancer Maternal Grandmother    Uterine cancer Maternal Grandmother    Colon cancer Maternal Grandmother    Healthy Son     Social History   Socioeconomic History   Marital status: Married    Spouse name: Not on file   Number of children: 1   Years of education: Not on file   Highest education level: Not on file  Occupational History   Occupation: Production designer, theatre/television/film and college professor    Comment: [employer]--PhD in Special education  Tobacco Use   Smoking status: Never    Passive exposure: Never   Smokeless tobacco: Never  Vaping Use   Vaping status: Never Used  Substance and Sexual Activity   Alcohol use: Not Currently   Drug use: No   Sexual activity: Not on file  Other Topics Concern   Not on file  Social History Narrative   Retired 2007    1 son in [place] area   Spends part time in [place]      Has living will   Son is health care POA   Not sure about DNR---will keep open resuscitation for now    No tube feedings if cognitively unaware   Right handed               Social Drivers of Corporate investment banker Strain: Not on file  Food Insecurity: Not on file  Transportation Needs: Not on file  Physical Activity: Not on file  Stress: Not on file  Social Connections: Not on file  Intimate Partner Violence: Not on file   Review of Systems Eating okay Sleeping fairly well     Objective:   Physical Exam Abdominal:     Palpations: Abdomen is soft.     Tenderness: There is no guarding or rebound.     Comments: No suprapubic tenderness  Musculoskeletal:      Comments: Just mild coccyx tenderness            Assessment & Plan:

## 2024-03-22 DIAGNOSIS — M6281 Muscle weakness (generalized): Secondary | ICD-10-CM | POA: Diagnosis not present

## 2024-03-22 DIAGNOSIS — R4189 Other symptoms and signs involving cognitive functions and awareness: Secondary | ICD-10-CM | POA: Diagnosis not present

## 2024-03-22 DIAGNOSIS — R278 Other lack of coordination: Secondary | ICD-10-CM | POA: Diagnosis not present

## 2024-03-22 DIAGNOSIS — S32000D Wedge compression fracture of unspecified lumbar vertebra, subsequent encounter for fracture with routine healing: Secondary | ICD-10-CM | POA: Diagnosis not present

## 2024-03-22 DIAGNOSIS — Z741 Need for assistance with personal care: Secondary | ICD-10-CM | POA: Diagnosis not present

## 2024-03-22 DIAGNOSIS — R2689 Other abnormalities of gait and mobility: Secondary | ICD-10-CM | POA: Diagnosis not present

## 2024-03-22 DIAGNOSIS — G7001 Myasthenia gravis with (acute) exacerbation: Secondary | ICD-10-CM | POA: Diagnosis not present

## 2024-03-22 LAB — URINE CULTURE
MICRO NUMBER:: 16268140
SPECIMEN QUALITY:: ADEQUATE

## 2024-03-23 ENCOUNTER — Encounter: Payer: Self-pay | Admitting: Internal Medicine

## 2024-03-23 DIAGNOSIS — Z741 Need for assistance with personal care: Secondary | ICD-10-CM | POA: Diagnosis not present

## 2024-03-23 DIAGNOSIS — R2689 Other abnormalities of gait and mobility: Secondary | ICD-10-CM | POA: Diagnosis not present

## 2024-03-23 DIAGNOSIS — R4189 Other symptoms and signs involving cognitive functions and awareness: Secondary | ICD-10-CM | POA: Diagnosis not present

## 2024-03-23 DIAGNOSIS — M6281 Muscle weakness (generalized): Secondary | ICD-10-CM | POA: Diagnosis not present

## 2024-03-23 DIAGNOSIS — G7001 Myasthenia gravis with (acute) exacerbation: Secondary | ICD-10-CM | POA: Diagnosis not present

## 2024-03-23 DIAGNOSIS — S32000D Wedge compression fracture of unspecified lumbar vertebra, subsequent encounter for fracture with routine healing: Secondary | ICD-10-CM | POA: Diagnosis not present

## 2024-03-23 DIAGNOSIS — R278 Other lack of coordination: Secondary | ICD-10-CM | POA: Diagnosis not present

## 2024-03-29 DIAGNOSIS — Z741 Need for assistance with personal care: Secondary | ICD-10-CM | POA: Diagnosis not present

## 2024-03-29 DIAGNOSIS — S32000D Wedge compression fracture of unspecified lumbar vertebra, subsequent encounter for fracture with routine healing: Secondary | ICD-10-CM | POA: Diagnosis not present

## 2024-03-29 DIAGNOSIS — R278 Other lack of coordination: Secondary | ICD-10-CM | POA: Diagnosis not present

## 2024-03-29 DIAGNOSIS — R4189 Other symptoms and signs involving cognitive functions and awareness: Secondary | ICD-10-CM | POA: Diagnosis not present

## 2024-03-29 DIAGNOSIS — R2689 Other abnormalities of gait and mobility: Secondary | ICD-10-CM | POA: Diagnosis not present

## 2024-03-29 DIAGNOSIS — M6281 Muscle weakness (generalized): Secondary | ICD-10-CM | POA: Diagnosis not present

## 2024-03-29 DIAGNOSIS — G7001 Myasthenia gravis with (acute) exacerbation: Secondary | ICD-10-CM | POA: Diagnosis not present

## 2024-03-31 DIAGNOSIS — H6981 Other specified disorders of Eustachian tube, right ear: Secondary | ICD-10-CM | POA: Diagnosis not present

## 2024-03-31 DIAGNOSIS — H9041 Sensorineural hearing loss, unilateral, right ear, with unrestricted hearing on the contralateral side: Secondary | ICD-10-CM | POA: Diagnosis not present

## 2024-03-31 DIAGNOSIS — H90A21 Sensorineural hearing loss, unilateral, right ear, with restricted hearing on the contralateral side: Secondary | ICD-10-CM | POA: Diagnosis not present

## 2024-04-01 DIAGNOSIS — G7001 Myasthenia gravis with (acute) exacerbation: Secondary | ICD-10-CM | POA: Diagnosis not present

## 2024-04-01 DIAGNOSIS — M6281 Muscle weakness (generalized): Secondary | ICD-10-CM | POA: Diagnosis not present

## 2024-04-01 DIAGNOSIS — R278 Other lack of coordination: Secondary | ICD-10-CM | POA: Diagnosis not present

## 2024-04-01 DIAGNOSIS — S32000D Wedge compression fracture of unspecified lumbar vertebra, subsequent encounter for fracture with routine healing: Secondary | ICD-10-CM | POA: Diagnosis not present

## 2024-04-01 DIAGNOSIS — R2689 Other abnormalities of gait and mobility: Secondary | ICD-10-CM | POA: Diagnosis not present

## 2024-04-01 DIAGNOSIS — Z741 Need for assistance with personal care: Secondary | ICD-10-CM | POA: Diagnosis not present

## 2024-04-01 DIAGNOSIS — R4189 Other symptoms and signs involving cognitive functions and awareness: Secondary | ICD-10-CM | POA: Diagnosis not present

## 2024-04-05 DIAGNOSIS — R278 Other lack of coordination: Secondary | ICD-10-CM | POA: Diagnosis not present

## 2024-04-05 DIAGNOSIS — M6281 Muscle weakness (generalized): Secondary | ICD-10-CM | POA: Diagnosis not present

## 2024-04-05 DIAGNOSIS — S32000D Wedge compression fracture of unspecified lumbar vertebra, subsequent encounter for fracture with routine healing: Secondary | ICD-10-CM | POA: Diagnosis not present

## 2024-04-05 DIAGNOSIS — Z741 Need for assistance with personal care: Secondary | ICD-10-CM | POA: Diagnosis not present

## 2024-04-05 DIAGNOSIS — R4189 Other symptoms and signs involving cognitive functions and awareness: Secondary | ICD-10-CM | POA: Diagnosis not present

## 2024-04-05 DIAGNOSIS — G7001 Myasthenia gravis with (acute) exacerbation: Secondary | ICD-10-CM | POA: Diagnosis not present

## 2024-04-05 DIAGNOSIS — R2689 Other abnormalities of gait and mobility: Secondary | ICD-10-CM | POA: Diagnosis not present

## 2024-04-06 DIAGNOSIS — R278 Other lack of coordination: Secondary | ICD-10-CM | POA: Diagnosis not present

## 2024-04-06 DIAGNOSIS — M6281 Muscle weakness (generalized): Secondary | ICD-10-CM | POA: Diagnosis not present

## 2024-04-06 DIAGNOSIS — R2689 Other abnormalities of gait and mobility: Secondary | ICD-10-CM | POA: Diagnosis not present

## 2024-04-06 DIAGNOSIS — Z741 Need for assistance with personal care: Secondary | ICD-10-CM | POA: Diagnosis not present

## 2024-04-06 DIAGNOSIS — R4189 Other symptoms and signs involving cognitive functions and awareness: Secondary | ICD-10-CM | POA: Diagnosis not present

## 2024-04-06 DIAGNOSIS — G7001 Myasthenia gravis with (acute) exacerbation: Secondary | ICD-10-CM | POA: Diagnosis not present

## 2024-04-06 DIAGNOSIS — S32000D Wedge compression fracture of unspecified lumbar vertebra, subsequent encounter for fracture with routine healing: Secondary | ICD-10-CM | POA: Diagnosis not present

## 2024-04-19 DIAGNOSIS — R4189 Other symptoms and signs involving cognitive functions and awareness: Secondary | ICD-10-CM | POA: Diagnosis not present

## 2024-04-19 DIAGNOSIS — G7001 Myasthenia gravis with (acute) exacerbation: Secondary | ICD-10-CM | POA: Diagnosis not present

## 2024-04-19 DIAGNOSIS — S32000D Wedge compression fracture of unspecified lumbar vertebra, subsequent encounter for fracture with routine healing: Secondary | ICD-10-CM | POA: Diagnosis not present

## 2024-04-19 DIAGNOSIS — R2689 Other abnormalities of gait and mobility: Secondary | ICD-10-CM | POA: Diagnosis not present

## 2024-04-19 DIAGNOSIS — R278 Other lack of coordination: Secondary | ICD-10-CM | POA: Diagnosis not present

## 2024-04-19 DIAGNOSIS — Z741 Need for assistance with personal care: Secondary | ICD-10-CM | POA: Diagnosis not present

## 2024-04-19 DIAGNOSIS — M6281 Muscle weakness (generalized): Secondary | ICD-10-CM | POA: Diagnosis not present

## 2024-04-26 DIAGNOSIS — Z741 Need for assistance with personal care: Secondary | ICD-10-CM | POA: Diagnosis not present

## 2024-04-26 DIAGNOSIS — R278 Other lack of coordination: Secondary | ICD-10-CM | POA: Diagnosis not present

## 2024-04-26 DIAGNOSIS — R4189 Other symptoms and signs involving cognitive functions and awareness: Secondary | ICD-10-CM | POA: Diagnosis not present

## 2024-04-26 DIAGNOSIS — S32000D Wedge compression fracture of unspecified lumbar vertebra, subsequent encounter for fracture with routine healing: Secondary | ICD-10-CM | POA: Diagnosis not present

## 2024-04-26 DIAGNOSIS — G7001 Myasthenia gravis with (acute) exacerbation: Secondary | ICD-10-CM | POA: Diagnosis not present

## 2024-04-26 DIAGNOSIS — R2689 Other abnormalities of gait and mobility: Secondary | ICD-10-CM | POA: Diagnosis not present

## 2024-04-26 DIAGNOSIS — M6281 Muscle weakness (generalized): Secondary | ICD-10-CM | POA: Diagnosis not present

## 2024-05-05 ENCOUNTER — Encounter: Payer: Self-pay | Admitting: Internal Medicine

## 2024-05-05 ENCOUNTER — Ambulatory Visit: Admitting: Internal Medicine

## 2024-05-05 VITALS — BP 130/80 | HR 67 | Temp 98.6°F | Ht 66.5 in | Wt 164.0 lb

## 2024-05-05 DIAGNOSIS — F39 Unspecified mood [affective] disorder: Secondary | ICD-10-CM | POA: Diagnosis not present

## 2024-05-05 MED ORDER — LORAZEPAM 0.5 MG PO TABS: 0.2500 mg | ORAL_TABLET | Freq: Two times a day (BID) | ORAL | 0 refills | Status: DC | PRN

## 2024-05-05 NOTE — Assessment & Plan Note (Signed)
 Now with grieving---husband died 3 days ago Does get some relief from the lorazepam ---cuts in half Discussed signs of complicated grieving---will monitor

## 2024-05-05 NOTE — Progress Notes (Signed)
 Subjective:    Patient ID: Jennifer Schmitt, female    DOB: 1935-11-18, 89 y.o.   MRN: [id number]  HPI Here for follow up of lumbar compression fracture With son  Back is better  Husband just died 3 days ago Bad crying spells Lorazepam  helps the nerves  Does have multiple services---visitors from neighbors regularly Will resume social interactions now that the responsibilities of caring for her husband are gone  Current Outpatient Medications on File Prior to Visit  Medication Sig Dispense Refill   acetaminophen (TYLENOL) 500 MG tablet Take 1,000 mg by mouth as needed.     amiodarone  (PACERONE ) 200 MG tablet Take 1 tablet (200 mg total) by mouth every other day. 90 tablet 0   calcium  carbonate (CALCIUM  600) 600 MG TABS tablet Take 1 tablet (600 mg total) by mouth daily. 30 tablet 0   Cholecalciferol (VITAMIN D) 2000 units CAPS Take 2,000 Units by mouth daily.     cyanocobalamin  (VITAMIN B12) 1000 MCG/ML injection INJECT ONE ML MONTHLY AS INSTRUCTED 1 mL 11   diltiazem  (CARDIZEM ) 30 MG tablet Take 1 tablet (30 mg total) by mouth 3 (three) times daily as needed (atrial fibrillation lasting more than 2 hours). 60 tablet 3   fluticasone (FLONASE) 50 MCG/ACT nasal spray Place 1 spray into both nostrils daily.     folic acid  (FOLVITE ) 1 MG tablet TAKE ONE TABLET BY MOUTH DAILY 90 tablet 3   hydrocortisone  2.5 % cream Apply topically 3 (three) times daily as needed. 28 g 3   lansoprazole  (PREVACID ) 15 MG capsule TAKE ONE CAPSULE BY MOUTH DAILY AT 12 (NOON) 30 capsule 2   LORazepam  (ATIVAN ) 0.5 MG tablet TAKE ONE HALF TO ONE TABLET BY MOUTH TWICE DAILY AS NEEDED FOR ANXIETY 30 tablet 0   metoprolol  succinate (TOPROL -XL) 25 MG 24 hr tablet TAKE ONE TABLET BY MOUTH DAILY 90 tablet 3   MIEBO 1.338 GM/ML SOLN Apply 1 drop to eye daily.     Multiple Vitamins-Minerals (PRESERVISION AREDS 2 PO) Take by mouth daily.     mycophenolate  (CELLCEPT ) 500 MG tablet Take 1 tablet (500 mg total) by mouth 2  (two) times daily. 60 tablet 11   ondansetron  (ZOFRAN ) 4 MG tablet Take 1 tablet (4 mg total) by mouth 3 (three) times daily as needed for nausea or vomiting. 30 tablet 0   predniSONE  (DELTASONE ) 5 MG tablet Take 1 tablet (5 mg total) by mouth daily with breakfast. 90 tablet 3   propranolol  (INDERAL ) 10 MG tablet Take 1 tablet (10 mg total) by mouth 3 (three) times daily. daily as needed (for breakthrough atrial fibrillation) 60 tablet 1   risedronate  (ACTONEL ) 150 MG tablet Take 1 tablet (150 mg total) by mouth every 30 (thirty) days. with water on empty stomach, nothing by mouth or lie down for next 30 minutes. 3 tablet 3   rosuvastatin  (CRESTOR ) 5 MG tablet TAKE ONE TABLET BY MOUTH ONCE A DAY 90 tablet 3   Trolamine Salicylate (ASPERCREME EX) Apply topically as needed.     valsartan  (DIOVAN ) 80 MG tablet TAKE HALF A TABLET BY MOUTH DAILY 135 tablet 0   XARELTO  20 MG TABS tablet TAKE ONE TABLET BY MOUTH ONCE A DAY WITH THE EVENING MEAL. 90 tablet 1   No current facility-administered medications on file prior to visit.    Allergies  Allergen Reactions   Diltiazem  Hcl Other (See Comments)    Drops blood pressure drastically.   Actonel  [Risedronate  Sodium] Nausea And Vomiting  Boniva [Ibandronate] Nausea And Vomiting   Diclofenac  Other (See Comments)    Voltaren  Gel aggravated her sinus passages   Fosamax [Alendronate Sodium] Nausea And Vomiting   Lipitor [Atorvastatin] Other (See Comments)    Muscle aches. Tolerates Crestor .   Miacalcin [Calcitonin ([place])] Other (See Comments)    Sneezing    Myrbetriq  [Mirabegron ] Other (See Comments)    Gastritis   Sertraline  Hcl Other (See Comments)    Irritable, etc   Talwin [Pentazocine] Other (See Comments)    Hallucinations    Latex Rash    Sneezing, watery eyes.   Niaspan [Niacin Er (Antihyperlipidemic)] Swelling and Rash    Past Medical History:  Diagnosis Date   Adenomatous colon polyp    Allergic rhinitis due to pollen    Anemia     Anxiety    Bronchiectasis (HCC)    Complication of anesthesia    Degenerative disc disease    cervical and lumbar   Dysrhythmia    GERD (gastroesophageal reflux disease)    Hearing loss in right ear    Heart murmur    HOH (hard of hearing)    AIDS   Hyperlipidemia    Hypertension    Osteoarthritis, multiple sites    PAF (paroxysmal atrial fibrillation) (HCC)    a. 01/2013 Echo [facility]): EF 60-65%, no rwma, mildly dil LA/RA, mild AI/TR, trace MR, nl RV size/fxn, mild PAH; b. CHA2DS2VASc = 4-->Chronic Xarelto .   PMR (polymyalgia rheumatica) (HCC)    PONV (postoperative nausea and vomiting)    Rheumatic fever    Sleep apnea    NO CPAP   Urge incontinence    Vertigo    Vertigo     Past Surgical History:  Procedure Laterality Date   ABDOMINAL HYSTERECTOMY     ANKLE FRACTURE SURGERY Left 1996   BACK SURGERY     BREAST BIOPSY Left 2010   negative   CATARACT EXTRACTION W/PHACO Left 10/05/2018   Procedure: CATARACT EXTRACTION PHACO AND INTRAOCULAR LENS PLACEMENT (IOC);  Surgeon: Clair Crews, MD;  Location: [facility] ORS;  Service: Ophthalmology;  Laterality: Left;  [place]  00:40 CDE 6.92 fluid pack lot # [id number]      CATARACT EXTRACTION W/PHACO Right 10/26/2018   Procedure: CATARACT EXTRACTION PHACO AND INTRAOCULAR LENS PLACEMENT (IOC);  Surgeon: Clair Crews, MD;  Location: [facility] ORS;  Service: Ophthalmology;  Laterality: Right;  [place]   00:47 CDE 7.11 Fluid pack lot # [id number]   DEXA  11/09/06   normal   FRACTURE SURGERY     Meningitis  1963   SPINE SURGERY     SYNOVECTOMY Right 1973   elbow   TONSILLECTOMY AND ADENOIDECTOMY      Family History  Problem Relation Age of Onset   Schizophrenia Mother        paranoid   Heart disease Mother        from psych meds   Arthritis Mother    Rheum arthritis Mother    Cancer Father        prostate   Arthritis Father    Diabetes Father    Breast cancer Paternal Aunt 82   Ovarian cancer Maternal Grandmother     Uterine cancer Maternal Grandmother    Colon cancer Maternal Grandmother    Healthy Son     Social History   Socioeconomic History   Marital status: Widowed    Spouse name: Not on file   Number of children: 1   Years of education: Not on file  Highest education level: Not on file  Occupational History   Occupation: Production designer, theatre/television/film and college professor    Comment: [employer]--PhD in Special education  Tobacco Use   Smoking status: Never    Passive exposure: Never   Smokeless tobacco: Never  Vaping Use   Vaping status: Never Used  Substance and Sexual Activity   Alcohol use: Not Currently   Drug use: No   Sexual activity: Not on file  Other Topics Concern   Not on file  Social History Narrative   Retired 2007    Widowed 5/25   1 son in [place] area   Spends part time in [place]       Has living will   Son is health care POA   Not sure about DNR---will keep open resuscitation for now    No tube feedings if cognitively unaware   Right handed               Social Drivers of Corporate investment banker Strain: Not on file  Food Insecurity: Not on file  Transportation Needs: Not on file  Physical Activity: Not on file  Stress: Not on file  Social Connections: Not on file  Intimate Partner Violence: Not on file   Review of Systems Sleeping--occasionally needs melatonin Eating some--not as much Weight stable      Objective:   Physical Exam Constitutional:      Appearance: Normal appearance.  Neurological:     Mental Status: She is alert.  Psychiatric:     Comments: Slightly tearful but normal appearance and speech            Assessment & Plan:

## 2024-05-10 ENCOUNTER — Other Ambulatory Visit: Payer: Self-pay | Admitting: Cardiology

## 2024-05-18 DIAGNOSIS — J309 Allergic rhinitis, unspecified: Secondary | ICD-10-CM | POA: Diagnosis not present

## 2024-05-18 DIAGNOSIS — H90A21 Sensorineural hearing loss, unilateral, right ear, with restricted hearing on the contralateral side: Secondary | ICD-10-CM | POA: Diagnosis not present

## 2024-05-23 DIAGNOSIS — L853 Xerosis cutis: Secondary | ICD-10-CM | POA: Diagnosis not present

## 2024-05-23 DIAGNOSIS — L570 Actinic keratosis: Secondary | ICD-10-CM | POA: Diagnosis not present

## 2024-05-26 NOTE — Progress Notes (Signed)
 I saw Jennifer Schmitt in neurology clinic on 06/02/24 in follow up for AChR ab positive myasthenia gravis.  HPI: Jennifer Schmitt is a 89 y.o. year old female with a history of seropositive myasthenia gravis, polymyalgia rheumatica, HTN, HLD, pAfib, and OA who we last saw on 03/01/24.  To briefly review: Patient was previously seen in this office by Dr. Festus Hubert (last seen on 06/05/22). Per initial clinic note on 05/08/22: Since January-February 2023, she has had progressive weakness.  She began having trouble rolling over in bed.  She was unable to get up and off her bed so she started sleeping on the couch..  Noted weakness in the left leg.  Seems to be dragging her right leg.  When standing, always with sensation that legs will give out. She has had falls and unable to stand up, requiring to call EMS.  Notes particular weakness in the proximal arms and trouble keeping her head up.  Started relying on a cane more frequently and then wheelchair.  She also endorsed worsening musculoskeletal pain with pain from the neck down her spine to her sacrum.  She does have degenerative spine disease and polymyalgia rheumatica.  MRI of brain with and without contrast on 04/27/2022 personally reviewed showed moderate chronic small vessel ischemic changes and known 1 x 2 mm vestibular schwannoma within the right internal auditory canal, stable compared to prior imaging in 2017.  She started noticing that her left eyelid was drooping.  She went to the ophthalmologist who suspected temporal arteritis given her history of PMR.  No vision loss.  Sed rate and CRP on 04/23/2022 were 101 and 10.1 respectively and was started on prednisone  60mg  daily with some improvement.  Based on strong likelihood of giant cell arteritis with elevated inflammatory markers and having PMR, temporal artery biopsy was not pursued.  She was started on prednisone  60mg  daily but discontinued after 3 days due to side effects.  She also has had increased  urinary urgency and sometimes fecal incontinence.  Due to sacral pain following a fall, she had X-ray of lumbar spine and MRI of sacrum which were negative for occult fracture.  AChR Binding Ab on 04/28/2022 was elevated at 28.10.  PCP started her on pyridostigmine  30mg  three times daily.  She notes improvement in strength in the legs.  However, she still has a head drop.  Denies double vision, trouble swallowing, trouble chewing, and trouble taking a breath.   At 05/08/22 visit, Dr. Festus Hubert recommended prednisone  20 mg daily, mestinon  60 mg TID (7 am, 1 pm, 7 pm). CT chest on 05/15/22 was negative for mediastinal mass (no evidence of thymoma). Patient discontinued prednisone  shortly after starting as it made her jittery and not able to sleep. At follow up with Dr. Festus Hubert on 06/05/22, patient was not feeling well, but was walking a little better. She still had head drop. Hospital admission for a course of IVIg was suggested to prevent MG crisis, but patient declined as she needed to be home to take care of her husband with Alzheimer's disease. Cellcept  500 mg BID was started with IVIg every 28 days until Cellcept  was therapeutic. She was also continued on mestinon . CBC and CMP were set up to be check weekly for 4 weeks, followed by monthly for 3 months. Patient called the next day though because she did not want IVIg. She decided to restart prednisone  instead.   She felt much improved on 09/05/22. She is about 75% better than prior visit.  Patient lives at a retirement with healthcare and PT/OT. She has many resources there. Patient went to PT and OT. She is currently done with both. Husband is currently in a memory care unit twice a week.   Current medications: Cellcept  500 mg BID, Prednisone  20 mg once in the morning, Mestinon  60 mg TID (7:30-8am, 12pm, 8-9pm).   Side effects: Cellcept  is difficult to take on a empty stomach. She gets a little nausea but this is moderately improved. She has hot flashes or  temperature fluctuations that are episodic. She is otherwise not having significant side effects.   MG symptoms on 09/05/22: Ptosis: Some drooping on left, pretty constant; improved compared to prior, worse when tired, not bothersome Double vision: Never Speech: Occasionally slurred speech, especially when tired; no one else commenting on it per patient Chewing: No problems currently Swallowing: Occasionally, episodic difficulty requiring a double swallow; no coughing Breathing: No problems currently Arm strength: No problems; Neck is still weak, but much improved. Cannot sit up straight for long periods of time Leg strength: Walks with a walker. She feels like her legs are strong. She has a walker due to not being able to stand up right.   At her visit on 09/05/22, we continued prednisone  20 mg daily, Cellcept  500 mg BID, but started tapering her mestinon  (stopped on 09/11/22). I called the patient on 09/12/22 at which time she noted no change in her symptoms off mestinon . As a result, we agreed to begin tapering her prednisone . She decreased to 15 mg on 09/13/22.   At 02/12/23 visit, patient was doing well with minimal symptoms. She was on prednisone  10 mg daily Cellcept  500 mg BID and not taking mestinon . Prednisone  was decreased to 7.5 mg daily on 02/12/23 (other medications kept the same). She mentioned neck and back pain and numbness in the 5th digit of bilateral hands at this visit.   At 05/01/23 visit, patient was doing well from an MG perspective. There was some confusion on whether she was currently on 5 mg or 7.5 mg of prednisone  (supposed to be on 7.5 mg but thinks she was taking 5 mg).   At 08/05/23 visit, patient was doing well from an MG perspective. She did mention a mild increase in chronic headaches (1 per week) and occasional muscle stiffness. Her prednisone  was decreased to 4 mg daily.  03/01/24: Patient went to ED on 02/28/24 for right flank pain and difficulty walking as a result. She  was seen at urgent care a couple of weeks prior and thought to have UTI, so given antibiotics that helped. Symptoms then returned and were severe on the left. CT scan showed an L4 compression fracture that was felt to be causing her symptoms. Dr. Ada Holler of neurosurgery recommended LSO brace and outpatient follow up. She is wearing the lumbar brace. She does think this helps, but she cannot wear when she is sleeping and this gives her more pain when not in the brace.   She endorses numbness in fingers and toes for a couple of years. She thinks symptoms are stable but has noticed this more of late.   Current MG symptoms: Ptosis: occasional by someone noticing Double vision: none Speech: mild slurring Chewing: none Swallowing: feels things are not going down as well Breathing: none Arm strength: no changes Leg strength: no changes   Current medications:  -Cellcept  500 mg BID -Not been taking Prednisone  4 mg for about 1 month -Mestinon  60 mg PRN - taking morning and night  Side effects: No   Most recent Assessment and Plan (03/01/24): This is PAILYN BELLEVUE, a 89 y.o. female with AChR ab positive generalized myasthenia gravis (symptom onset 12/2021 with diagnosis 04/28/2022). CT chest on 05/13/22 with no mediastinal mass. She currently has minimal MG symptoms despite being off prednisone  for one month. She may have mild symptoms where she previously had none. There has been a lot of stress, an infection, and back pain that may also be contributing to mild worsening. She has had a recent increased in back pain, likely due to discovered L4 compression fracture in ED.    Plan: -CBC w/ diff and CMP every 6 months. Will check ~08/2024 -Continue Cellcept  500 mg BID -Restart prednisone  at 5 mg -Patient instructed to MG symptoms worsen and call the office. -Mestinon  60 mg as needed -Continue staying active -Discussed MG crisis, what to watch for, and when to go to nearest ED including difficulty  breathing or swallowing   -Referral to spine surgery - call social worker at [facility] [phone]  Since their last visit: Patient lost her husband, Gwynn Lesches, about 1 month ago. She is grieving this loss.   Patient saw NSGY who did not think there needed any intervention. This back pain has improved.  Current MG symptoms: Ptosis: maybe a little bit in right eyelid Double vision: No, but has dry eyes Speech: occasional slurring, very mild, only patient notices Chewing: none Swallowing: occasional difficulty with meat Breathing: none Arm strength: no Leg strength: no  Still has tingling in feet and hands, right more than left.  Current medications:  -Cellcept  500 mg BID -Prednisone  5 mg daily -Mestinon  60 mg PRN - she is not sure as [facility] her  Ppx: -Calcium  600 mg daily -Vit D 2000 international units daily  Side effects: no    MEDICATIONS:  Outpatient Encounter Medications as of 06/02/2024  Medication Sig   acetaminophen (TYLENOL) 500 MG tablet Take 1,000 mg by mouth as needed.   amiodarone  (PACERONE ) 200 MG tablet Take 1 tablet (200 mg total) by mouth every other day.   calcium  carbonate (CALCIUM  600) 600 MG TABS tablet Take 1 tablet (600 mg total) by mouth daily.   Cholecalciferol (VITAMIN D) 2000 units CAPS Take 2,000 Units by mouth daily.   cyanocobalamin  (VITAMIN B12) 1000 MCG/ML injection INJECT ONE ML MONTHLY AS INSTRUCTED   diltiazem  (CARDIZEM ) 30 MG tablet Take 1 tablet (30 mg total) by mouth 3 (three) times daily as needed (atrial fibrillation lasting more than 2 hours).   fluticasone (FLONASE) 50 MCG/ACT nasal spray Place 1 spray into both nostrils daily.   folic acid  (FOLVITE ) 1 MG tablet TAKE ONE TABLET BY MOUTH DAILY   hydrocortisone  2.5 % cream Apply topically 3 (three) times daily as needed.   lansoprazole  (PREVACID ) 15 MG capsule TAKE ONE CAPSULE BY MOUTH DAILY AT 12 (NOON)   LORazepam  (ATIVAN ) 0.5 MG tablet  Take 0.5 tablets (0.25 mg total) by mouth 2 (two) times daily as needed for anxiety.   metoprolol  succinate (TOPROL -XL) 25 MG 24 hr tablet TAKE ONE TABLET BY MOUTH DAILY   MIEBO 1.338 GM/ML SOLN Apply 1 drop to eye daily.   Multiple Vitamins-Minerals (PRESERVISION AREDS 2 PO) Take by mouth daily.   mycophenolate  (CELLCEPT ) 500 MG tablet Take 1 tablet (500 mg total) by mouth 2 (two) times daily.   ondansetron  (ZOFRAN ) 4 MG tablet Take 1 tablet (4 mg total) by mouth 3 (three) times daily  as needed for nausea or vomiting.   predniSONE  (DELTASONE ) 1 MG tablet Take 4 tablets (4 mg total) by mouth daily with breakfast.   propranolol  (INDERAL ) 10 MG tablet Take 1 tablet (10 mg total) by mouth 3 (three) times daily. daily as needed (for breakthrough atrial fibrillation)   risedronate  (ACTONEL ) 150 MG tablet Take 1 tablet (150 mg total) by mouth every 30 (thirty) days. with water on empty stomach, nothing by mouth or lie down for next 30 minutes.   rosuvastatin  (CRESTOR ) 5 MG tablet TAKE ONE TABLET BY MOUTH ONCE A DAY   Trolamine Salicylate (ASPERCREME EX) Apply topically as needed.   valsartan  (DIOVAN ) 80 MG tablet TAKE HALF A TABLET BY MOUTH DAILY   XARELTO  20 MG TABS tablet TAKE ONE TABLET BY MOUTH ONCE A DAY WITH THE EVENING MEAL.   [DISCONTINUED] predniSONE  (DELTASONE ) 5 MG tablet Take 1 tablet (5 mg total) by mouth daily with breakfast.   No facility-administered encounter medications on file as of 06/02/2024.    PAST MEDICAL HISTORY: Past Medical History:  Diagnosis Date   Adenomatous colon polyp    Allergic rhinitis due to pollen    Anemia    Anxiety    Bronchiectasis (HCC)    Complication of anesthesia    Degenerative disc disease    cervical and lumbar   Dysrhythmia    GERD (gastroesophageal reflux disease)    Hearing loss in right ear    Heart murmur    HOH (hard of hearing)    AIDS   Hyperlipidemia    Hypertension    Osteoarthritis, multiple sites    PAF (paroxysmal atrial  fibrillation) (HCC)    a. 01/2013 Echo [facility]): EF 60-65%, no rwma, mildly dil LA/RA, mild AI/TR, trace MR, nl RV size/fxn, mild PAH; b. CHA2DS2VASc = 4-->Chronic Xarelto .   PMR (polymyalgia rheumatica) (HCC)    PONV (postoperative nausea and vomiting)    Rheumatic fever    Sleep apnea    NO CPAP   Urge incontinence    Vertigo    Vertigo     PAST SURGICAL HISTORY: Past Surgical History:  Procedure Laterality Date   ABDOMINAL HYSTERECTOMY     ANKLE FRACTURE SURGERY Left 1996   BACK SURGERY     BREAST BIOPSY Left 2010   negative   CATARACT EXTRACTION W/PHACO Left 10/05/2018   Procedure: CATARACT EXTRACTION PHACO AND INTRAOCULAR LENS PLACEMENT (IOC);  Surgeon: Clair Crews, MD;  Location: [facility] ORS;  Service: Ophthalmology;  Laterality: Left;  US  00:40 CDE 6.92 fluid pack lot # [id number]      CATARACT EXTRACTION W/PHACO Right 10/26/2018   Procedure: CATARACT EXTRACTION PHACO AND INTRAOCULAR LENS PLACEMENT (IOC);  Surgeon: Clair Crews, MD;  Location: [facility] ORS;  Service: Ophthalmology;  Laterality: Right;  US   00:47 CDE 7.11 Fluid pack lot # [id number]   DEXA  11/09/06   normal   FRACTURE SURGERY     Meningitis  1963   SPINE SURGERY     SYNOVECTOMY Right 1973   elbow   TONSILLECTOMY AND ADENOIDECTOMY      ALLERGIES: Allergies  Allergen Reactions   Diltiazem  Hcl Other (See Comments)    Drops blood pressure drastically.   Actonel  [Risedronate  Sodium] Nausea And Vomiting   Boniva [Ibandronate] Nausea And Vomiting   Diclofenac  Other (See Comments)    Voltaren  Gel aggravated her sinus passages   Fosamax [Alendronate Sodium] Nausea And Vomiting   Lipitor [Atorvastatin] Other (See Comments)    Muscle aches. Tolerates Crestor .  Miacalcin [Calcitonin ([place])] Other (See Comments)    Sneezing    Myrbetriq  [Mirabegron ] Other (See Comments)    Gastritis   Sertraline  Hcl Other (See Comments)    Irritable, etc   Talwin [Pentazocine] Other (See Comments)     Hallucinations    Latex Rash    Sneezing, watery eyes.   Niaspan [Niacin Er (Antihyperlipidemic)] Swelling and Rash    FAMILY HISTORY: Family History  Problem Relation Age of Onset   Schizophrenia Mother        paranoid   Heart disease Mother        from psych meds   Arthritis Mother    Rheum arthritis Mother    Cancer Father        prostate   Arthritis Father    Diabetes Father    Breast cancer Paternal Aunt 39   Ovarian cancer Maternal Grandmother    Uterine cancer Maternal Grandmother    Colon cancer Maternal Grandmother    Healthy Son     SOCIAL HISTORY: Social History   Tobacco Use   Smoking status: Never    Passive exposure: Never   Smokeless tobacco: Never  Vaping Use   Vaping status: Never Used  Substance Use Topics   Alcohol use: Not Currently   Drug use: No   Social History   Social History Narrative   Retired 2007    Widowed 5/25   1 son in [place] area   Spends part time in [place]       Has living will   Son is health care POA   Not sure about DNR---will keep open resuscitation for now    No tube feedings if cognitively unaware   Right handed                Objective:  Vital Signs:  BP 138/71   Pulse 61   Ht 5' 6 (1.676 m)   Wt 162 lb (73.5 kg)   SpO2 95%   BMI 26.15 kg/m   General: General appearance: Awake and alert. No distress. Cooperative with exam.  Skin: No obvious rash or jaundice. HEENT: Atraumatic. Anicteric. Lungs: Non-labored breathing on room air  Heart: Regular Abdomen: Soft, non tender. Extremities: No edema. No obvious deformity.  Musculoskeletal: No obvious joint swelling.  Neurological: Mental Status: Alert. Speech fluent. No pseudobulbar affect Cranial Nerves: CNII: No RAPD. Visual fields intact. CNIII, IV, VI: PERRL. No nystagmus. EOMI. Mild diplopia with sustained up gaze. CN V: Facial sensation intact bilaterally to fine touch. CN VII: Facial muscles symmetric and strong. No ptosis at rest or  after sustained up gaze. CN VIII: Hears finger rub well bilaterally. CN IX: No hypophonia. CN X: Palate elevates symmetrically. CN XI: Full strength shoulder shrug bilaterally. CN XII: Tongue protrusion full and midline. No atrophy or fasciculations. No significant dysarthria Motor: Tone is normal. Strength 5/5 in bilateral upper and lower extremities Reflexes:  Right Left  Bicep 2+ 2+  Tricep 2+ 2+  BrRad 2+ 2+  Knee 2+ 2+  Ankle 1+ 1+   Sensation: Vibration diminished in bilateral great toes, otherwise intact Coordination: Intact finger-to- nose-finger bilaterally. Gait: Walks with walker. Stooped posture, slow steps.    Lab and Test Review: No new results  Previously reviewed results: 02/28/24: CBC w/ diff unremarkable BMP unremarkable   09/11/23: TSH wnl Free T4 wnl CMP unremarkable  CBC w/ diff (05/01/23): unremarkable  03/13/23: CMP: unremarkable TSH: 1.963   10/08/22: CBC and CMP wnl   08/27/22: Normal or  unremarkable: CBC, CMP, TSH   08/15/22: Normal ESR and CRP   AChR binding ab: positive at 28.10   CT chest (05/15/22): FINDINGS: Cardiovascular: Moderate aortic atherosclerosis. No aneurysm. Coronary vascular calcification. Normal cardiac size. No pericardial effusion   Mediastinum/Nodes: Midline trachea. No thyroid  mass. Subcarinal node measuring 12 mm. Esophagus is normal.   Lungs/Pleura: Mild bronchiectasis in the right middle lobe and bilateral lower lobes with scarring. No acute airspace disease, pleural effusion or pneumothorax.   Upper Abdomen: No acute abnormality.   Musculoskeletal: No chest wall abnormality. No acute or significant osseous findings.   IMPRESSION: 1. Negative for mediastinal mass lesion. 2. Mild bronchiectasis in the right middle and bilateral lower lobes with pulmonary scarring  ASSESSMENT: This is KAMBER VIGNOLA, a 89 y.o. female with: AChR ab positive generalized myasthenia gravis (symptom onset 12/2021 with  diagnosis 04/28/2022). CT chest on 05/13/22 with no mediastinal mass. She is currently doing well with minimal symptom manifestation on Cellcept  and low dose prednisone . Numbness and tingling in hands and feet - examination with mild vibratory sensation loss in both feet today. May be due to a mild distal symmetric neuropathy. She has no recent falls.  Plan: -CBC w/ diff and CMP - will check at next visit to monitor for Cellcept  toxicity -Reduce Prednisone  4 mg daily -Mestinon  60 mg TID PRN -Cellcept  500 mg BID -Continue staying active -Discussed MG crisis, what to watch for, and when to go to nearest ED including difficulty breathing or swallowing  For bone health: -Vit D 1000 international units daily -Calcium  intake of 1000-1200 mg/day (diet or supplemental)  -Fall precautions discussed  Return to clinic in 6 months   Rommie Coats, MD

## 2024-06-02 ENCOUNTER — Other Ambulatory Visit: Payer: Self-pay | Admitting: Neurology

## 2024-06-02 ENCOUNTER — Other Ambulatory Visit: Payer: Self-pay | Admitting: Cardiovascular Disease

## 2024-06-02 ENCOUNTER — Ambulatory Visit: Admitting: Neurology

## 2024-06-02 ENCOUNTER — Encounter: Payer: Self-pay | Admitting: Neurology

## 2024-06-02 VITALS — BP 138/71 | HR 61 | Ht 66.0 in | Wt 162.0 lb

## 2024-06-02 DIAGNOSIS — G629 Polyneuropathy, unspecified: Secondary | ICD-10-CM | POA: Diagnosis not present

## 2024-06-02 DIAGNOSIS — M545 Low back pain, unspecified: Secondary | ICD-10-CM | POA: Diagnosis not present

## 2024-06-02 DIAGNOSIS — G7000 Myasthenia gravis without (acute) exacerbation: Secondary | ICD-10-CM

## 2024-06-02 DIAGNOSIS — G8929 Other chronic pain: Secondary | ICD-10-CM | POA: Diagnosis not present

## 2024-06-02 DIAGNOSIS — H02403 Unspecified ptosis of bilateral eyelids: Secondary | ICD-10-CM | POA: Diagnosis not present

## 2024-06-02 DIAGNOSIS — R269 Unspecified abnormalities of gait and mobility: Secondary | ICD-10-CM | POA: Diagnosis not present

## 2024-06-02 MED ORDER — PREDNISONE 1 MG PO TABS: 4.0000 mg | ORAL_TABLET | Freq: Every day | ORAL | 11 refills | Status: DC

## 2024-06-02 NOTE — Patient Instructions (Signed)
 -Reduce Prednisone  to 4 mg daily (4 tablets of 1 mg) -Mestinon  60 mg three times daily as needed for weakness, double vision, drooping eyes -Cellcept  500 mg twice daily -Continue staying active  Go to nearest emergency room if you have severe weakness, difficulty breathing or swallowing as this could be a myasthenic crisis (flare).  For bone health: -Vit D 1000 international units daily -Calcium  intake of 1000-1200 mg/day (diet or supplemental)  I will see you again in 6 months. Please let me know if you have any questions or concerns in the meantime.  The physicians and staff at [facility] are committed to providing excellent care. You may receive a survey requesting feedback about your experience at our office. We strive to receive very good responses to the survey questions. If you feel that your experience would prevent you from giving the office a very good  response, please contact our office to try to remedy the situation. We may be reached at [phone]. Thank you for taking the time out of your busy day to complete the survey.  Rommie Coats, MD [facility]  Preventing Falls at [facility] are common, often dreaded events in the lives of older people. Aside from the obvious injuries and even death that may result, fall can cause wide-ranging consequences including loss of independence, mental decline, decreased activity and mobility. Younger people are also at risk of falling, especially those with chronic illnesses and fatigue.  Ways to reduce risk for falling Examine diet and medications. Warm foods and alcohol dilate blood vessels, which can lead to dizziness when standing. Sleep aids, antidepressants and pain medications can also increase the likelihood of a fall.  Get a vision exam. Poor vision, cataracts and glaucoma increase the chances of falling.  Check foot gear. Shoes should fit snugly and have a sturdy, nonskid sole and a broad, low heel  Participate  in a physician-approved exercise program to build and maintain muscle strength and improve balance and coordination. Programs that use ankle weights or stretch bands are excellent for muscle-strengthening. Water aerobics programs and low-impact Tai Chi programs have also been shown to improve balance and coordination.  Increase vitamin D intake. Vitamin D improves muscle strength and increases the amount of calcium  the body is able to absorb and deposit in bones.  How to prevent falls from common hazards Floors - Remove all loose wires, cords, and throw rugs. Minimize clutter. Make sure rugs are anchored and smooth. Keep furniture in its usual place.  Chairs -- Use chairs with straight backs, armrests and firm seats. Add firm cushions to existing pieces to add height.  Bathroom - Install grab bars and non-skid tape in the tub or shower. Use a bathtub transfer bench or a shower chair with a back support Use an elevated toilet seat and/or safety rails to assist standing from a low surface. Do not use towel racks or bathroom tissue holders to help you stand.  Lighting - Make sure halls, stairways, and entrances are well-lit. Install a night light in your bathroom or hallway. Make sure there is a light switch at the top and bottom of the staircase. Turn lights on if you get up in the middle of the night. Make sure lamps or light switches are within reach of the bed if you have to get up during the night.  Kitchen - Install non-skid rubber mats near the sink and stove. Clean spills immediately. Store frequently used utensils, pots, pans between waist and eye level.  This helps prevent reaching and bending. Sit when getting things out of lower cupboards.  Living room/ Bedrooms - Place furniture with wide spaces in between, giving enough room to move around. Establish a route through the living room that gives you something to hold onto as you walk.  Stairs - Make sure treads, rails, and rugs are secure.  Install a rail on both sides of the stairs. If stairs are a threat, it might be helpful to arrange most of your activities on the lower level to reduce the number of times you must climb the stairs.  Entrances and doorways - Install metal handles on the walls adjacent to the doorknobs of all doors to make it more secure as you travel through the doorway.  Tips for maintaining balance Keep at least one hand free at all times. Try using a backpack or fanny pack to hold things rather than carrying them in your hands. Never carry objects in both hands when walking as this interferes with keeping your balance.  Attempt to swing both arms from front to back while walking. This might require a conscious effort if Parkinson's disease has diminished your movement. It will, however, help you to maintain balance and posture, and reduce fatigue.  Consciously lift your feet off of the ground when walking. Shuffling and dragging of the feet is a common culprit in losing your balance.  When trying to navigate turns, use a U technique of facing forward and making a wide turn, rather than pivoting sharply.  Try to stand with your feet shoulder-length apart. When your feet are close together for any length of time, you increase your risk of losing your balance and falling.  Do one thing at a time. Don't try to walk and accomplish another task, such as reading or looking around. The decrease in your automatic reflexes complicates motor function, so the less distraction, the better.  Do not wear rubber or gripping soled shoes, they might catch on the floor and cause tripping.  Move slowly when changing positions. Use deliberate, concentrated movements and, if needed, use a grab bar or walking aid. Count 15 seconds between each movement. For example, when rising from a seated position, wait 15 seconds after standing to begin walking.  If balance is a continuous problem, you might want to consider a walking aid  such as a cane, walking stick, or walker. Once you've mastered walking with help, you might be ready to try it on your own again.

## 2024-06-06 ENCOUNTER — Other Ambulatory Visit: Payer: Self-pay | Admitting: Cardiovascular Disease

## 2024-06-08 ENCOUNTER — Other Ambulatory Visit: Payer: Self-pay | Admitting: Cardiovascular Disease

## 2024-07-13 DIAGNOSIS — F4321 Adjustment disorder with depressed mood: Secondary | ICD-10-CM | POA: Diagnosis not present

## 2024-07-18 ENCOUNTER — Other Ambulatory Visit: Payer: Self-pay | Admitting: Cardiovascular Disease

## 2024-07-18 DIAGNOSIS — I4819 Other persistent atrial fibrillation: Secondary | ICD-10-CM

## 2024-07-18 NOTE — Telephone Encounter (Signed)
 Prescription refill request for Xarelto  received.  Indication:Afib  Last office visit: 09/11/23 Jomarie)  Weight: 73.5kg Age: 89 Scr: 0.71 (02/28/24)  CrCl: 65.75ml/min  Appropriate dose. Refill sent.

## 2024-07-26 DIAGNOSIS — F4321 Adjustment disorder with depressed mood: Secondary | ICD-10-CM | POA: Diagnosis not present

## 2024-08-02 DIAGNOSIS — F4321 Adjustment disorder with depressed mood: Secondary | ICD-10-CM | POA: Diagnosis not present

## 2024-08-07 ENCOUNTER — Encounter: Payer: Self-pay | Admitting: Family Medicine

## 2024-08-07 NOTE — Progress Notes (Deleted)
 Jennifer Podgurski T. Lyan Holck, MD, CAQ Sports Medicine [facility] at [facility] [address] [place], [address]  Phone: [phone]  FAX: [phone]  Jennifer Schmitt - 89 y.o. female  MRN [id number]  Date of Birth: 31-Mar-1935  Date: 08/10/2024  PCP: Jimmy Charlie FERNS, MD  Referral: Jimmy Charlie FERNS, MD  No chief complaint on file.  Subjective:   Jennifer Schmitt is a 89 y.o. very pleasant female patient with There is no height or weight on file to calculate BMI. who presents with the following:  Discussed the use of AI scribe software for clinical note transcription with the patient, who gave verbal consent to proceed.  The last time she saw Dr. Jimmy, she was grieving from the loss of her husband a few days prior.  She is scheduled for 4 to 41-month follow-up.  I actually have not seen her in a few years?  Myasthenia gravis.  She does see [facility] regularly.  She is currently on CellCept  and prednisone .  Paroxysmal atrial fibrillation.  She is currently on Xarelto  and amiodarone .    History of Present Illness      Review of Systems is noted in the HPI, as appropriate  Objective:   There were no vitals taken for this visit.  GEN: No acute distress; alert,appropriate. PULM: Breathing comfortably in no respiratory distress PSYCH: Normally interactive.   Physical Exam   Laboratory and Imaging Data:  Results   Assessment and Plan:   No diagnosis found.  Assessment and Plan Assessment & Plan      Medication Management during today's office visit: No orders of the defined types were placed in this encounter.  There are no discontinued medications.  Orders placed today for conditions managed today: No orders of the defined types were placed in this encounter.   Disposition: No follow-ups on file.  Dragon Medical One speech-to-text software was used for transcription in this dictation.  Possible transcriptional  errors can occur using Animal nutritionist.   Signed,  Jacques DASEN. Kathya Wilz, MD   Outpatient Encounter Medications as of 08/10/2024  Medication Sig   acetaminophen (TYLENOL) 500 MG tablet Take 1,000 mg by mouth as needed.   amiodarone  (PACERONE ) 200 MG tablet Take 1 tablet (200 mg total) by mouth every other day.   calcium  carbonate (CALCIUM  600) 600 MG TABS tablet Take 1 tablet (600 mg total) by mouth daily.   Cholecalciferol (VITAMIN D) 2000 units CAPS Take 2,000 Units by mouth daily.   cyanocobalamin  (VITAMIN B12) 1000 MCG/ML injection INJECT ONE ML MONTHLY AS INSTRUCTED   diltiazem  (CARDIZEM ) 30 MG tablet Take 1 tablet (30 mg total) by mouth 3 (three) times daily as needed (atrial fibrillation lasting more than 2 hours).   fluticasone (FLONASE) 50 MCG/ACT nasal spray Place 1 spray into both nostrils daily.   folic acid  (FOLVITE ) 1 MG tablet TAKE ONE TABLET BY MOUTH DAILY   hydrocortisone  2.5 % cream Apply topically 3 (three) times daily as needed.   lansoprazole  (PREVACID ) 15 MG capsule TAKE ONE CAPSULE BY MOUTH DAILY AT 12 (NOON)   LORazepam  (ATIVAN ) 0.5 MG tablet Take 0.5 tablets (0.25 mg total) by mouth 2 (two) times daily as needed for anxiety.   metoprolol  succinate (TOPROL -XL) 25 MG 24 hr tablet TAKE ONE TABLET BY MOUTH DAILY   MIEBO 1.338 GM/ML SOLN Apply 1 drop to eye daily.   Multiple Vitamins-Minerals (PRESERVISION AREDS 2 PO) Take by mouth daily.   mycophenolate  (CELLCEPT ) 500 MG  tablet Take 1 tablet (500 mg total) by mouth 2 (two) times daily.   ondansetron  (ZOFRAN ) 4 MG tablet Take 1 tablet (4 mg total) by mouth 3 (three) times daily as needed for nausea or vomiting.   predniSONE  (DELTASONE ) 1 MG tablet Take 4 tablets (4 mg total) by mouth daily with breakfast.   propranolol  (INDERAL ) 10 MG tablet Take 1 tablet (10 mg total) by mouth 3 (three) times daily. daily as needed (for breakthrough atrial fibrillation)   risedronate  (ACTONEL ) 150 MG tablet Take 1 tablet (150 mg total) by  mouth every 30 (thirty) days. with water on empty stomach, nothing by mouth or lie down for next 30 minutes.   rosuvastatin  (CRESTOR ) 5 MG tablet TAKE ONE TABLET BY MOUTH ONCE A DAY   Trolamine Salicylate (ASPERCREME EX) Apply topically as needed.   valsartan  (DIOVAN ) 80 MG tablet TAKE HALF A TABLET BY MOUTH DAILY   XARELTO  20 MG TABS tablet TAKE ONE TABLET BY MOUTH ONCE A DAY WITH THE EVENING MEAL.   No facility-administered encounter medications on file as of 08/10/2024.

## 2024-08-08 ENCOUNTER — Ambulatory Visit: Admitting: Internal Medicine

## 2024-08-10 ENCOUNTER — Ambulatory Visit: Admitting: Internal Medicine

## 2024-08-10 ENCOUNTER — Encounter: Payer: Self-pay | Admitting: Internal Medicine

## 2024-08-10 ENCOUNTER — Ambulatory Visit: Admitting: Family Medicine

## 2024-08-10 VITALS — BP 130/80 | HR 54 | Temp 98.9°F | Ht 66.0 in | Wt 166.0 lb

## 2024-08-10 DIAGNOSIS — F39 Unspecified mood [affective] disorder: Secondary | ICD-10-CM | POA: Diagnosis not present

## 2024-08-10 NOTE — Assessment & Plan Note (Signed)
 Is moving through the grieving process in a reasonable manner She has significant family support Also social engagement at [facility] Has counselor as well No medications

## 2024-08-10 NOTE — Progress Notes (Signed)
 Subjective:    Patient ID: Jennifer Schmitt, female    DOB: 05-23-1935, 89 y.o.   MRN: [id number]  HPI Here for follow up of grieving and stress  Seeing a counselor--through [facility] or hospice This is helpful to focus on moving forward  Staying busy Going to lunch with family Has help in house Starting to get out more socially Went to Bible study Walking a lot--using rollator. Plans to go to fitness center  Current Outpatient Medications on File Prior to Visit  Medication Sig Dispense Refill   acetaminophen (TYLENOL) 500 MG tablet Take 1,000 mg by mouth as needed.     amiodarone  (PACERONE ) 200 MG tablet Take 1 tablet (200 mg total) by mouth every other day. 90 tablet 1   calcium  carbonate (CALCIUM  600) 600 MG TABS tablet Take 1 tablet (600 mg total) by mouth daily. 30 tablet 0   Cholecalciferol (VITAMIN D) 2000 units CAPS Take 2,000 Units by mouth daily.     cyanocobalamin  (VITAMIN B12) 1000 MCG/ML injection INJECT ONE ML MONTHLY AS INSTRUCTED 1 mL 11   diltiazem  (CARDIZEM ) 30 MG tablet Take 1 tablet (30 mg total) by mouth 3 (three) times daily as needed (atrial fibrillation lasting more than 2 hours). 60 tablet 3   fluticasone (FLONASE) 50 MCG/ACT nasal spray Place 1 spray into both nostrils daily.     folic acid  (FOLVITE ) 1 MG tablet TAKE ONE TABLET BY MOUTH DAILY 90 tablet 3   hydrocortisone  2.5 % cream Apply topically 3 (three) times daily as needed. 28 g 3   lansoprazole  (PREVACID ) 15 MG capsule TAKE ONE CAPSULE BY MOUTH DAILY AT 12 (NOON) 30 capsule 5   LORazepam  (ATIVAN ) 0.5 MG tablet Take 0.5 tablets (0.25 mg total) by mouth 2 (two) times daily as needed for anxiety. 30 tablet 0   metoprolol  succinate (TOPROL -XL) 25 MG 24 hr tablet TAKE ONE TABLET BY MOUTH DAILY 90 tablet 3   MIEBO 1.338 GM/ML SOLN Apply 1 drop to eye daily.     Multiple Vitamins-Minerals (PRESERVISION AREDS 2 PO) Take by mouth daily.     mycophenolate  (CELLCEPT ) 500 MG tablet Take 1 tablet (500 mg  total) by mouth 2 (two) times daily. 60 tablet 11   ondansetron  (ZOFRAN ) 4 MG tablet Take 1 tablet (4 mg total) by mouth 3 (three) times daily as needed for nausea or vomiting. 30 tablet 0   predniSONE  (DELTASONE ) 1 MG tablet Take 4 tablets (4 mg total) by mouth daily with breakfast. 120 tablet 11   propranolol  (INDERAL ) 10 MG tablet Take 1 tablet (10 mg total) by mouth 3 (three) times daily. daily as needed (for breakthrough atrial fibrillation) 60 tablet 1   risedronate  (ACTONEL ) 150 MG tablet Take 1 tablet (150 mg total) by mouth every 30 (thirty) days. with water on empty stomach, nothing by mouth or lie down for next 30 minutes. 3 tablet 3   rosuvastatin  (CRESTOR ) 5 MG tablet TAKE ONE TABLET BY MOUTH ONCE A DAY 90 tablet 3   Trolamine Salicylate (ASPERCREME EX) Apply topically as needed.     valsartan  (DIOVAN ) 80 MG tablet TAKE HALF A TABLET BY MOUTH DAILY 15 tablet 0   XARELTO  20 MG TABS tablet TAKE ONE TABLET BY MOUTH ONCE A DAY WITH THE EVENING MEAL. 90 tablet 1   No current facility-administered medications on file prior to visit.    Allergies  Allergen Reactions   Diltiazem  Hcl Other (See Comments)    Drops blood pressure drastically.  Actonel  [Risedronate  Sodium] Nausea And Vomiting   Boniva [Ibandronate] Nausea And Vomiting   Diclofenac  Other (See Comments)    Voltaren  Gel aggravated her sinus passages   Fosamax [Alendronate Sodium] Nausea And Vomiting   Lipitor [Atorvastatin] Other (See Comments)    Muscle aches. Tolerates Crestor .   Miacalcin [Calcitonin (Salmon)] Other (See Comments)    Sneezing    Myrbetriq  [Mirabegron ] Other (See Comments)    Gastritis   Sertraline  Hcl Other (See Comments)    Irritable, etc   Talwin [Pentazocine] Other (See Comments)    Hallucinations    Latex Rash    Sneezing, watery eyes.   Niaspan [Niacin Er (Antihyperlipidemic)] Swelling and Rash    Past Medical History:  Diagnosis Date   Adenomatous colon polyp    Allergic rhinitis due  to pollen    Anemia    Anxiety    Bronchiectasis (HCC)    Degenerative disc disease    cervical and lumbar   GERD (gastroesophageal reflux disease)    Hyperlipidemia    Hypertension    Osteoarthritis, multiple sites    PAF (paroxysmal atrial fibrillation) (HCC)    a. 01/2013 Echo [facility]): EF 60-65%, no rwma, mildly dil LA/RA, mild AI/TR, trace MR, nl RV size/fxn, mild PAH; b. CHA2DS2VASc = 4-->Chronic Xarelto .   PMR (polymyalgia rheumatica) (HCC)    Rheumatic fever    Sleep apnea    NO CPAP   Urge incontinence    Vertigo    Vertigo     Past Surgical History:  Procedure Laterality Date   ABDOMINAL HYSTERECTOMY     ANKLE FRACTURE SURGERY Left 1996   BACK SURGERY     BREAST BIOPSY Left 2010   negative   CATARACT EXTRACTION W/PHACO Left 10/05/2018   Procedure: CATARACT EXTRACTION PHACO AND INTRAOCULAR LENS PLACEMENT (IOC);  Surgeon: Jaye Fallow, MD;  Location: [facility] ORS;  Service: Ophthalmology;  Laterality: Left;  US  00:40 CDE 6.92 fluid pack lot # [id number]      CATARACT EXTRACTION W/PHACO Right 10/26/2018   Procedure: CATARACT EXTRACTION PHACO AND INTRAOCULAR LENS PLACEMENT (IOC);  Surgeon: Jaye Fallow, MD;  Location: [facility] ORS;  Service: Ophthalmology;  Laterality: Right;  US   00:47 CDE 7.11 Fluid pack lot # [id number]   DEXA  11/09/06   normal   FRACTURE SURGERY     Meningitis  1963   SPINE SURGERY     SYNOVECTOMY Right 1973   elbow   TONSILLECTOMY AND ADENOIDECTOMY      Family History  Problem Relation Age of Onset   Schizophrenia Mother        paranoid   Heart disease Mother        from psych meds   Arthritis Mother    Rheum arthritis Mother    Cancer Father        prostate   Arthritis Father    Diabetes Father    Breast cancer Paternal Aunt 42   Ovarian cancer Maternal Grandmother    Uterine cancer Maternal Grandmother    Colon cancer Maternal Grandmother    Healthy Son     Social History   Socioeconomic History   Marital  status: Widowed    Spouse name: Not on file   Number of children: 1   Years of education: Not on file   Highest education level: Not on file  Occupational History   Occupation: Production designer, theatre/television/film and college professor    Comment: [employer]--PhD in Special education  Tobacco Use   Smoking status: Never  Passive exposure: Never   Smokeless tobacco: Never  Vaping Use   Vaping status: Never Used  Substance and Sexual Activity   Alcohol use: Not Currently   Drug use: No   Sexual activity: Not on file  Other Topics Concern   Not on file  Social History Narrative   Retired 2007    Widowed 5/25   1 son in [place] area   Spends part time in [place]       Has living will   Son is health care POA   Not sure about DNR---will keep open resuscitation for now    No tube feedings if cognitively unaware   Right handed               Social Drivers of Health   Financial Resource Strain: Not on file  Food Insecurity: Not on file  Transportation Needs: Not on file  Physical Activity: Not on file  Stress: Not on file  Social Connections: Not on file  Intimate Partner Violence: Not on file   Review of Systems Eating more--weight up 2# Sleeps okay with melatonin (low dose)    Objective:   Physical Exam Constitutional:      Appearance: Normal appearance.  Neurological:     Mental Status: She is alert.  Psychiatric:        Mood and Affect: Mood normal.        Behavior: Behavior normal.     Comments: Slightly tearful talking about husband--but not depressed            Assessment & Plan:

## 2024-08-16 ENCOUNTER — Other Ambulatory Visit: Payer: Self-pay | Admitting: Cardiovascular Disease

## 2024-08-17 DIAGNOSIS — F4321 Adjustment disorder with depressed mood: Secondary | ICD-10-CM | POA: Diagnosis not present

## 2024-08-28 NOTE — Progress Notes (Signed)
 Jennifer Ureste T. Tifini Reeder, Jennifer Schmitt, Jennifer Schmitt at [facility] [address] [place], [address]  Phone: [phone]  FAX: [phone]  Jennifer Schmitt - 89 y.o. female  MRN [id number]  Date of Birth: [birth year]  Date: 08/29/2024  PCP: Jimmy Charlie FERNS, Jennifer Schmitt  Referral: Jimmy Charlie FERNS, Jennifer Schmitt  No chief complaint on file.  Subjective:   Jennifer Schmitt is a 89 y.o. very pleasant female patient with There is no height or weight on file to calculate BMI. who presents with the following:  Discussed the use of AI scribe software for clinical note transcription with the patient, who gave verbal consent to proceed.  The patient presents with rib pain and nausea.  Recently talked with Dr. Jimmy about bereavement and stress. History of Present Illness     Review of Systems is noted in the HPI, as appropriate  Objective:   There were no vitals taken for this visit.  GEN: No acute distress; alert,appropriate. PULM: Breathing comfortably in no respiratory distress PSYCH: Normally interactive.   Physical Exam   Laboratory and Imaging Data:  Assessment and Plan:   No diagnosis found. Assessment & Plan   Medication Management during today's office visit: No orders of the defined types were placed in this encounter.  There are no discontinued medications.  Orders placed today for conditions managed today: No orders of the defined types were placed in this encounter.   Disposition: No follow-ups on file.  Dragon Medical One speech-to-text software was used for transcription in this dictation.  Possible transcriptional errors can occur using Animal nutritionist.   Signed,  Jacques DASEN. Lorelie Biermann, Jennifer Schmitt   Outpatient Encounter Medications as of 08/29/2024  Medication Sig   acetaminophen (TYLENOL) 500 MG tablet Take 1,000 mg by mouth as needed.   amiodarone  (PACERONE ) 200 MG tablet Take 1 tablet (200 mg total) by mouth every other day.    calcium  carbonate (CALCIUM  600) 600 MG TABS tablet Take 1 tablet (600 mg total) by mouth daily.   Cholecalciferol (VITAMIN D) 2000 units CAPS Take 2,000 Units by mouth daily.   cyanocobalamin  (VITAMIN B12) 1000 MCG/ML injection INJECT ONE ML MONTHLY AS INSTRUCTED   diltiazem  (CARDIZEM ) 30 MG tablet Take 1 tablet (30 mg total) by mouth 3 (three) times daily as needed (atrial fibrillation lasting more than 2 hours).   fluticasone (FLONASE) 50 MCG/ACT nasal spray Place 1 spray into both nostrils daily.   folic acid  (FOLVITE ) 1 MG tablet TAKE ONE TABLET BY MOUTH DAILY   hydrocortisone  2.5 % cream Apply topically 3 (three) times daily as needed.   lansoprazole  (PREVACID ) 15 MG capsule TAKE ONE CAPSULE BY MOUTH DAILY AT 12 (NOON)   LORazepam  (ATIVAN ) 0.5 MG tablet Take 0.5 tablets (0.25 mg total) by mouth 2 (two) times daily as needed for anxiety.   metoprolol  succinate (TOPROL -XL) 25 MG 24 hr tablet TAKE ONE TABLET BY MOUTH DAILY   MIEBO 1.338 GM/ML SOLN Apply 1 drop to eye daily.   Multiple Vitamins-Minerals (PRESERVISION AREDS 2 PO) Take by mouth daily.   mycophenolate  (CELLCEPT ) 500 MG tablet Take 1 tablet (500 mg total) by mouth 2 (two) times daily.   ondansetron  (ZOFRAN ) 4 MG tablet Take 1 tablet (4 mg total) by mouth 3 (three) times daily as needed for nausea or vomiting.   predniSONE  (DELTASONE ) 1 MG tablet Take 4 tablets (4 mg total) by mouth daily with breakfast.   propranolol  (INDERAL ) 10 MG tablet Take 1 tablet (  10 mg total) by mouth 3 (three) times daily. daily as needed (for breakthrough atrial fibrillation)   risedronate  (ACTONEL ) 150 MG tablet Take 1 tablet (150 mg total) by mouth every 30 (thirty) days. with water on empty stomach, nothing by mouth or lie down for next 30 minutes.   rosuvastatin  (CRESTOR ) 5 MG tablet TAKE ONE TABLET BY MOUTH ONCE A DAY   Trolamine Salicylate (ASPERCREME EX) Apply topically as needed.   valsartan  (DIOVAN ) 80 MG tablet TAKE HALF A TABLET BY MOUTH DAILY    XARELTO  20 MG TABS tablet TAKE ONE TABLET BY MOUTH ONCE A DAY WITH THE EVENING MEAL.   No facility-administered encounter medications on file as of 08/29/2024.

## 2024-08-29 ENCOUNTER — Encounter: Payer: Self-pay | Admitting: Family Medicine

## 2024-08-29 ENCOUNTER — Ambulatory Visit: Admitting: Family Medicine

## 2024-08-29 VITALS — BP 148/72 | HR 62 | Temp 98.0°F | Ht 66.0 in | Wt 166.0 lb

## 2024-08-29 DIAGNOSIS — K219 Gastro-esophageal reflux disease without esophagitis: Secondary | ICD-10-CM

## 2024-08-29 DIAGNOSIS — G7000 Myasthenia gravis without (acute) exacerbation: Secondary | ICD-10-CM

## 2024-08-29 DIAGNOSIS — I10 Essential (primary) hypertension: Secondary | ICD-10-CM | POA: Diagnosis not present

## 2024-08-29 DIAGNOSIS — I4891 Unspecified atrial fibrillation: Secondary | ICD-10-CM | POA: Diagnosis not present

## 2024-08-29 DIAGNOSIS — G3184 Mild cognitive impairment, so stated: Secondary | ICD-10-CM | POA: Diagnosis not present

## 2024-08-29 DIAGNOSIS — I480 Paroxysmal atrial fibrillation: Secondary | ICD-10-CM

## 2024-08-29 DIAGNOSIS — E782 Mixed hyperlipidemia: Secondary | ICD-10-CM | POA: Diagnosis not present

## 2024-08-29 NOTE — Patient Instructions (Signed)
 Propranolol  - check your pill packs, but I think that you aren't taking that anymore.

## 2024-09-09 ENCOUNTER — Ambulatory Visit: Payer: Self-pay

## 2024-09-09 NOTE — Telephone Encounter (Signed)
 FYI Only or Action Required?: Action required by provider: update on patient condition.  Patient was last seen in primary care on 08/29/2024 by [name], MD.  Called Nurse Triage reporting Eye Pain.  Symptoms began today.  Interventions attempted: Prescription medications: eye drops eye flushing.  Symptoms are: gradually worsening.  Triage Disposition: See Physician Within 24 Hours  Patient/caregiver understands and will follow disposition?: No, wishes to speak with PCP  Copied from CRM #[id number]. Topic: Clinical - Red Word Triage >> Sep 09, 2024  3:44 PM Macario HERO wrote: Red Word that prompted transfer to Nurse Triage:  Patient experiencing dry eyes, feels like sharp pain, like a graine of salt in her right eye. Reason for Disposition  Eye pain present > 24 hours  Answer Assessment - Initial Assessment Questions Patient contacting her ophthalmologist, but would appreciate recommendations from Dr. Watt  1. ONSET: When did the pain start? (e.g., minutes, hours, days)     Two days ago, eyes progressively getting drier, sharp pain today just happened today 2. TIMING: Does the pain come and go, or has it been constant since it started? (e.g., constant, intermittent, fleeting)     constant 3. SEVERITY: How bad is the pain?  (Scale 1-10; mild, moderate or severe)     Sharp pain 4. LOCATION: Where does it hurt?  (e.g., eyelid, eye, cheekbone)     Right eye outside corner 5. CAUSE: What do you think is causing the pain?     unsure 6. VISION: Do you have blurred vision or changes in your vision?      History of eye issues 7. EYE DISCHARGE: Is there any discharge (pus) from the eye(s)?  If Yes, ask: What color is it?      Tearing, otherwise no drainage 8. FEVER: Do you have a fever? If Yes, ask: What is it, how was it measured, and when did it start?      denies 9. OTHER SYMPTOMS: Do you have any other symptoms? (e.g., headache, nasal discharge, facial  rash)    Head ache due to eye strain, tearing 10. PREGNANCY: Is there any chance you are pregnant? When was your last menstrual period?       N/a  Protocols used: Eye Pain and Other Symptoms-A-AH

## 2024-09-09 NOTE — Telephone Encounter (Signed)
 Patient contacting her ophthalmologist, but would appreciate recommendations from Dr. Watt

## 2024-09-12 NOTE — Telephone Encounter (Signed)
 Dr. Brien notified as instructed by telephone.  She was able to get in touch with her eye doctor and has an appointment scheduled this week.  FYI to Dr. Watt.

## 2024-09-12 NOTE — Telephone Encounter (Signed)
 Can you check on her?  Hopefully she was able to reach her eye doctor.  If she is having focal pain in the eye, I think you have to worry about a corneal abrasion, which should be checked by Optometry or Ophthalmology.  They usually need antibiotic drops for about a week.

## 2024-09-21 ENCOUNTER — Ambulatory Visit: Payer: Self-pay

## 2024-09-21 NOTE — Telephone Encounter (Signed)
 FYI Only or Action Required?: FYI only for provider.  Patient was last seen in primary care on 08/29/2024 by [name], MD.  Called Nurse Triage reporting Abdominal Pain.  Symptoms began today.  Interventions attempted: Nothing.  Symptoms are: stable.  Triage Disposition: See Physician Within 24 Hours  Patient/caregiver understands and will follow disposition?: Yes     Copied from CRM #[id number]. Topic: Clinical - Red Word Triage >> Sep 21, 2024  9:53 AM [place] A wrote: [facility] that prompted transfer to Nurse Triage: Patient is having pain and has been having issues with gall bladder. Has occurred for two days now. Reason for Disposition  [1] MODERATE pain (e.g., interferes with normal activities) AND [2] pain comes and goes (cramps) AND [3] present > 24 hours  (Exception: Pain with Vomiting or Diarrhea - see that Guideline.)  Answer Assessment - Initial Assessment Questions Pt's care coordinator called to schedule appt. Pt was supposed to have a dentist appt today and called the CC to ask her to cancel it. She just isn't feeling well. Nausea and some abdominal pain under her ribs. Pt believes it is her gallbladder as she has issues with this in the past.  Unable to get much more information out of CC. Pt scheduled with PCP tomorrow.    1. LOCATION: Where does it hurt?     under rib cage  Protocols used: Abdominal Pain - Female-A-AH

## 2024-09-22 ENCOUNTER — Other Ambulatory Visit: Payer: Self-pay | Admitting: Neurology

## 2024-09-22 ENCOUNTER — Ambulatory Visit: Admitting: Family Medicine

## 2024-09-22 DIAGNOSIS — G7000 Myasthenia gravis without (acute) exacerbation: Secondary | ICD-10-CM

## 2024-09-27 ENCOUNTER — Encounter: Payer: Self-pay | Admitting: Family Medicine

## 2024-09-27 ENCOUNTER — Ambulatory Visit: Admitting: Family Medicine

## 2024-09-27 VITALS — BP 122/60 | HR 60 | Temp 98.9°F | Ht 66.0 in | Wt 167.1 lb

## 2024-09-27 DIAGNOSIS — R110 Nausea: Secondary | ICD-10-CM | POA: Insufficient documentation

## 2024-09-27 DIAGNOSIS — R112 Nausea with vomiting, unspecified: Secondary | ICD-10-CM | POA: Insufficient documentation

## 2024-09-27 DIAGNOSIS — R1011 Right upper quadrant pain: Secondary | ICD-10-CM | POA: Diagnosis not present

## 2024-09-27 NOTE — Assessment & Plan Note (Signed)
 Episodic With n/v and some loose stool  Improved now after bad episode over weekend  ? If ate fatty food prior   Tenderness today in RUQ with positive murphy sign  Last CT scan (march) noted gb sludge or stones   Lab today  US  abd ordered   Instructed to stick with low fat/bland diet for now  Update if symptoms return  Call back and Er precautions noted in detail today

## 2024-09-27 NOTE — Patient Instructions (Signed)
 Labs today  If symptoms worsen please call  If severe-go to the ER  Stick to a bland low fat diet as much as you can Stay hydrated   Labs now  We will reach out with result   I put the referral in for an abdominal ultrasound  Please let us  know if you don't hear in 1-2 weeks to set that up (mychart message or call or letter)

## 2024-09-27 NOTE — Assessment & Plan Note (Signed)
 Episodic  (improved now) With RUQ pain  Last CT noted gb sludge or stones   Lab today  US  abd ordered   Call back and Er precautions noted in detail today

## 2024-09-27 NOTE — Progress Notes (Signed)
 Subjective:    Patient ID: Jennifer Schmitt, female    DOB: [birth year], 89 y.o.   MRN: [id number]  HPI  Wt Readings from Last 3 Encounters:  09/27/24 167 lb 2 oz (75.8 kg)  08/29/24 166 lb (75.3 kg)  08/10/24 166 lb (75.3 kg)   26.97 kg/m  Vitals:   09/27/24 1502 09/27/24 1533  BP: (!) 144/68 122/60  Pulse: 60   Temp: 98.9 F (37.2 C)   SpO2: 98%     89 yo pt of Dr Watt presents with  Upper right sided abd pain Nausea/vomiting Diarrhea   Has had episodic stomach problems her whole life   This episode  Several hours of vomiting and diarrhea  Projectile vomiting /no control  No fever (not feverish)  No blood in stool   That lasted about 2 hours at the most  Right upper abdominal pain during this  Under rib cage   Is still a little nauseated /no longer vomiting  Eating jello  Discomfort in RUQ (not pain)  Does not refer to back   Protein shakes Sandwiches  Gets meals at [facility]   More fatty food/eating our more lately   CT abd 02/2024 noted IMPRESSION: 1. Acute to subacute L4 compression fracture with 25% loss of height, and retropulsion contributing to Moderate spinal and right lateral recess stenosis when combined with facet arthropathy. 2. No other acute or inflammatory process identified in the noncontrast abdomen or pelvis. 3. Aortic Atherosclerosis (ICD10-I70.0). Large bowel diverticulosis. Gallbladder sludge or stones.  History of GERD and gastritis in the past  Prevacid  15 mg daily  Actonel  for bone density    More stress-lost husband 4 mo ago   No history of liver problems or hepatitis     Lab Results  Component Value Date   NA 139 02/28/2024   K 3.8 02/28/2024   CO2 27 02/28/2024   GLUCOSE 92 02/28/2024   BUN 22 02/28/2024   CREATININE 0.71 02/28/2024   CALCIUM  9.2 02/28/2024   GFR 75.17 10/08/2022   EGFR 67 09/11/2023   GFRNONAA >60 02/28/2024   Lab Results  Component Value Date   ALT 16 09/11/2023   AST 18  09/11/2023   ALKPHOS 50 09/11/2023   BILITOT 0.3 09/11/2023   No results found for: LIPASE Lab Results  Component Value Date   WBC 5.4 02/28/2024   HGB 12.3 02/28/2024   HCT 36.7 02/28/2024   MCV 98.1 02/28/2024   PLT 293 02/28/2024      Patient Active Problem List   Diagnosis Date Noted   Nausea & vomiting 09/27/2024   Closed compression fracture of fourth lumbar vertebra (HCC) 03/01/2024   Age-related osteoporosis with current pathological fracture 03/01/2024   RUQ pain 11/16/2023   Myasthenia gravis (HCC) 05/06/2022   MCI (mild cognitive impairment) 11/09/2019   Overactive bladder 11/23/2017   Neuropathy 10/15/2016   Pernicious anemia 04/11/2015   Mood disorder 04/11/2015   Advanced directives, counseling/discussion 10/10/2014   Obstructive sleep apnea 04/20/2014   Essential hypertension    Mixed hyperlipidemia    GERD without esophagitis    Seasonal allergic rhinitis due to pollen    Atrial fibrillation (HCC)    PMR (Resolved)    Past Medical History:  Diagnosis Date   Adenomatous colon polyp    Allergic rhinitis due to pollen    Allergy childhood   Anemia 2014   Anxiety    Cataract    Removed 2019   Degenerative disc disease  cervical and lumbar   GERD (gastroesophageal reflux disease)    Heart murmur    Hyperlipidemia    Hypertension    Osteoarthritis, multiple sites    PAF (paroxysmal atrial fibrillation) (HCC)    a. 01/2013 [facility]): EF 60-65%, no rwma, mildly dil LA/RA, mild AI/TR, trace MR, nl RV size/fxn, mild PAH; b. CHA2DS2VASc = 4-->Chronic Xarelto .   PMR (polymyalgia rheumatica)    Rheumatic fever    Sleep apnea    NO CPAP   Urge incontinence    Past Surgical History:  Procedure Laterality Date   ABDOMINAL HYSTERECTOMY  2003   ANKLE FRACTURE SURGERY Left 1996   BACK SURGERY     BREAST BIOPSY Left 2010   negative   CATARACT EXTRACTION W/PHACO Left 10/05/2018   Procedure: CATARACT EXTRACTION PHACO AND INTRAOCULAR LENS  PLACEMENT (IOC);  Surgeon: Jaye Fallow, MD;  Location: [facility] ORS;  Service: Ophthalmology;  Laterality: Left;  US  00:40 CDE 6.92 fluid pack lot # [id number]      CATARACT EXTRACTION W/PHACO Right 10/26/2018   Procedure: CATARACT EXTRACTION PHACO AND INTRAOCULAR LENS PLACEMENT (IOC);  Surgeon: Jaye Fallow, MD;  Location: [facility] ORS;  Service: Ophthalmology;  Laterality: Right;  US   00:47 CDE 7.11 Fluid pack lot # [id number]   EYE SURGERY  2019   FRACTURE SURGERY  1996   Meningitis  1963   SPINE SURGERY     SYNOVECTOMY Right 1973   elbow   TONSILLECTOMY AND ADENOIDECTOMY     Social History   Tobacco Use   Smoking status: Never    Passive exposure: Never   Smokeless tobacco: Never  Vaping Use   Vaping status: Never Used  Substance Use Topics   Alcohol use: Not Currently   Drug use: No   Family History  Problem Relation Age of Onset   Schizophrenia Mother        paranoid   Heart disease Mother        from psych meds   Arthritis Mother    Rheum arthritis Mother    Cancer Father        prostate   Arthritis Father    Diabetes Father    Breast cancer Paternal Aunt 56   Ovarian cancer Maternal Grandmother    Uterine cancer Maternal Grandmother    Colon cancer Maternal Grandmother    Healthy Son    Allergies  Allergen Reactions   Diltiazem  Hcl Other (See Comments)    Drops blood pressure drastically.   Actonel  [Risedronate  Sodium] Nausea And Vomiting   Boniva [Ibandronate] Nausea And Vomiting   Diclofenac  Other (See Comments)    Voltaren  Gel aggravated her sinus passages   Fosamax [Alendronate Sodium] Nausea And Vomiting   Lipitor [Atorvastatin] Other (See Comments)    Muscle aches. Tolerates Crestor .   Miacalcin [Calcitonin ([place])] Other (See Comments)    Sneezing    Myrbetriq  [Mirabegron ] Other (See Comments)    Gastritis   Sertraline  Hcl Other (See Comments)    Irritable, etc   Talwin [Pentazocine] Other (See Comments)    Hallucinations    Latex Rash     Sneezing, watery eyes.   Niaspan [Niacin Er (Antihyperlipidemic)] Swelling and Rash   Current Outpatient Medications on File Prior to Visit  Medication Sig Dispense Refill   acetaminophen (TYLENOL) 500 MG tablet Take 1,000 mg by mouth as needed.     amiodarone  (PACERONE ) 200 MG tablet Take 1 tablet (200 mg total) by mouth every other day. 90 tablet 1  calcium  carbonate (CALCIUM  600) 600 MG TABS tablet Take 1 tablet (600 mg total) by mouth daily. 30 tablet 0   Cholecalciferol (VITAMIN D) 2000 units CAPS Take 2,000 Units by mouth daily.     cyanocobalamin  (VITAMIN B12) 1000 MCG/ML injection INJECT ONE ML MONTHLY AS INSTRUCTED 1 mL 11   diltiazem  (CARDIZEM ) 30 MG tablet Take 1 tablet (30 mg total) by mouth 3 (three) times daily as needed (atrial fibrillation lasting more than 2 hours). 60 tablet 3   fluticasone (FLONASE) 50 MCG/ACT nasal spray Place 1 spray into both nostrils daily.     folic acid  (FOLVITE ) 1 MG tablet TAKE ONE TABLET BY MOUTH DAILY 90 tablet 3   lansoprazole  (PREVACID ) 15 MG capsule TAKE ONE CAPSULE BY MOUTH DAILY AT 12 (NOON) 30 capsule 5   LORazepam  (ATIVAN ) 0.5 MG tablet Take 0.5 tablets (0.25 mg total) by mouth 2 (two) times daily as needed for anxiety. 30 tablet 0   metoprolol  succinate (TOPROL -XL) 25 MG 24 hr tablet TAKE ONE TABLET BY MOUTH DAILY 90 tablet 3   MIEBO 1.338 GM/ML SOLN Apply 1 drop to eye daily.     Multiple Vitamins-Minerals (PRESERVISION AREDS 2 PO) Take by mouth daily.     mycophenolate  (CELLCEPT ) 500 MG tablet TAKE ONE TABLET (500 MG TOTAL) BY MOUTH TWO (TWO) TIMES DAILY. 60 tablet 11   ondansetron  (ZOFRAN ) 4 MG tablet Take 1 tablet (4 mg total) by mouth 3 (three) times daily as needed for nausea or vomiting. 30 tablet 0   predniSONE  (DELTASONE ) 1 MG tablet Take 4 tablets (4 mg total) by mouth daily with breakfast. 120 tablet 11   risedronate  (ACTONEL ) 150 MG tablet Take 1 tablet (150 mg total) by mouth every 30 (thirty) days. with water on empty  stomach, nothing by mouth or lie down for next 30 minutes. 3 tablet 3   rosuvastatin  (CRESTOR ) 5 MG tablet TAKE ONE TABLET BY MOUTH ONCE A DAY 90 tablet 3   Trolamine Salicylate (ASPERCREME EX) Apply topically as needed.     valsartan  (DIOVAN ) 80 MG tablet TAKE HALF A TABLET BY MOUTH DAILY 90 tablet 0   XARELTO  20 MG TABS tablet TAKE ONE TABLET BY MOUTH ONCE A DAY WITH THE EVENING MEAL. 90 tablet 1   No current facility-administered medications on file prior to visit.    Review of Systems  Constitutional:  Negative for activity change, appetite change, fatigue, fever and unexpected weight change.  HENT:  Negative for congestion, ear pain, rhinorrhea, sinus pressure and sore throat.   Eyes:  Negative for pain, redness and visual disturbance.  Respiratory:  Negative for cough, shortness of breath and wheezing.   Cardiovascular:  Negative for chest pain and palpitations.  Gastrointestinal:  Positive for abdominal pain and nausea. Negative for abdominal distention, blood in stool, constipation, diarrhea and vomiting.  Endocrine: Negative for polydipsia and polyuria.  Genitourinary:  Negative for dysuria, frequency and urgency.  Musculoskeletal:  Negative for arthralgias, back pain and myalgias.  Skin:  Negative for pallor and rash.  Allergic/Immunologic: Negative for environmental allergies.  Neurological:  Negative for dizziness, syncope and headaches.  Hematological:  Negative for adenopathy. Does not bruise/bleed easily.  Psychiatric/Behavioral:  Negative for decreased concentration and dysphoric mood. The patient is not nervous/anxious.        Objective:   Physical Exam Constitutional:      General: She is not in acute distress.    Appearance: Normal appearance. She is well-developed. She is obese. She is not  ill-appearing or diaphoretic.  HENT:     Head: Normocephalic and atraumatic.  Eyes:     Conjunctiva/sclera: Conjunctivae normal.     Pupils: Pupils are equal, round, and  reactive to light.  Neck:     Thyroid : No thyromegaly.     Vascular: No carotid bruit or JVD.  Cardiovascular:     Rate and Rhythm: Normal rate.     Heart sounds: Normal heart sounds.     No gallop.  Pulmonary:     Effort: Pulmonary effort is normal. No respiratory distress.     Breath sounds: Normal breath sounds. No stridor. No wheezing, rhonchi or rales.  Abdominal:     General: Abdomen is flat. Bowel sounds are normal. There is no distension or abdominal bruit. There are no signs of injury.     Palpations: Abdomen is soft. There is no shifting dullness, fluid wave, hepatomegaly, splenomegaly, mass or pulsatile mass.     Tenderness: There is abdominal tenderness in the right upper quadrant. There is no right CVA tenderness, left CVA tenderness, guarding or rebound. Positive signs include Murphy's sign.  Musculoskeletal:     Cervical back: Normal range of motion and neck supple.     Right lower leg: No edema.     Left lower leg: No edema.  Lymphadenopathy:     Cervical: No cervical adenopathy.  Skin:    General: Skin is warm and dry.     Coloration: Skin is not jaundiced or pale.     Findings: No bruising, erythema or rash.  Neurological:     Mental Status: She is alert.     Coordination: Coordination normal.     Deep Tendon Reflexes: Reflexes are normal and symmetric. Reflexes normal.  Psychiatric:        Mood and Affect: Mood normal.           Assessment & Plan:   Problem List Items Addressed This Visit       Digestive   Nausea & vomiting   Episodic  (improved now) With RUQ pain  Last CT noted gb sludge or stones   Lab today  US  abd ordered   Call back and Er precautions noted in detail today        Relevant Orders   CBC with Differential/Platelet   Basic metabolic panel with GFR   Hepatic function panel   Lipase   US  Abdomen Complete     Other   RUQ pain - Primary   Episodic With n/v and some loose stool  Improved now after bad episode over  weekend  ? If ate fatty food prior   Tenderness today in RUQ with positive murphy sign  Last CT scan (march) noted gb sludge or stones   Lab today  US  abd ordered   Instructed to stick with low fat/bland diet for now  Update if symptoms return  Call back and Er precautions noted in detail today        Relevant Orders   CBC with Differential/Platelet   Basic metabolic panel with GFR   Hepatic function panel   Lipase   US  Abdomen Complete

## 2024-09-28 ENCOUNTER — Ambulatory Visit: Payer: Self-pay | Admitting: Family Medicine

## 2024-09-28 LAB — HEPATIC FUNCTION PANEL
ALT: 12 U/L (ref 0–35)
AST: 15 U/L (ref 0–37)
Albumin: 4.2 g/dL (ref 3.5–5.2)
Alkaline Phosphatase: 45 U/L (ref 39–117)
Bilirubin, Direct: 0 mg/dL (ref 0.0–0.3)
Total Bilirubin: 0.4 mg/dL (ref 0.2–1.2)
Total Protein: 6.9 g/dL (ref 6.0–8.3)

## 2024-09-28 LAB — CBC WITH DIFFERENTIAL/PLATELET
Basophils Absolute: 0 K/uL (ref 0.0–0.1)
Basophils Relative: 0.4 % (ref 0.0–3.0)
Eosinophils Absolute: 0 K/uL (ref 0.0–0.7)
Eosinophils Relative: 0.4 % (ref 0.0–5.0)
HCT: 35.3 % — ABNORMAL LOW (ref 36.0–46.0)
Hemoglobin: 11.8 g/dL — ABNORMAL LOW (ref 12.0–15.0)
Lymphocytes Relative: 14.6 % (ref 12.0–46.0)
Lymphs Abs: 0.8 K/uL (ref 0.7–4.0)
MCHC: 33.4 g/dL (ref 30.0–36.0)
MCV: 95.9 fl (ref 78.0–100.0)
Monocytes Absolute: 0.5 K/uL (ref 0.1–1.0)
Monocytes Relative: 8.6 % (ref 3.0–12.0)
Neutro Abs: 4.2 K/uL (ref 1.4–7.7)
Neutrophils Relative %: 76 % (ref 43.0–77.0)
Platelets: 241 K/uL (ref 150.0–400.0)
RBC: 3.68 Mil/uL — ABNORMAL LOW (ref 3.87–5.11)
RDW: 14.9 % (ref 11.5–15.5)
WBC: 5.5 K/uL (ref 4.0–10.5)

## 2024-09-28 LAB — BASIC METABOLIC PANEL WITH GFR
BUN: 15 mg/dL (ref 6–23)
CO2: 31 meq/L (ref 19–32)
Calcium: 8.7 mg/dL (ref 8.4–10.5)
Chloride: 102 meq/L (ref 96–112)
Creatinine, Ser: 0.85 mg/dL (ref 0.40–1.20)
GFR: 61.76 mL/min (ref >60.00)
Glucose, Bld: 79 mg/dL (ref 70–99)
Potassium: 4.3 meq/L (ref 3.5–5.1)
Sodium: 139 meq/L (ref 135–145)

## 2024-09-28 LAB — LIPASE: Lipase: 21 U/L (ref 11.0–59.0)

## 2024-10-04 ENCOUNTER — Ambulatory Visit
Admission: RE | Admit: 2024-10-04 | Discharge: 2024-10-04 | Disposition: A | Source: Ambulatory Visit | Attending: Family Medicine | Admitting: Family Medicine

## 2024-10-04 DIAGNOSIS — R112 Nausea with vomiting, unspecified: Secondary | ICD-10-CM

## 2024-10-04 DIAGNOSIS — R1011 Right upper quadrant pain: Secondary | ICD-10-CM

## 2024-10-06 ENCOUNTER — Other Ambulatory Visit: Payer: Self-pay | Admitting: Cardiology

## 2024-10-26 ENCOUNTER — Telehealth: Payer: Self-pay

## 2024-10-26 NOTE — Telephone Encounter (Signed)
 OK.  That is in 5 months.  No need to place those orders now.  We can do it closer to her physical.

## 2024-10-26 NOTE — Telephone Encounter (Signed)
 Copied from CRM #[id number]. Topic: Clinical - Request for Lab/Test Order >> Oct 26, 2024  9:19 AM Jennifer Schmitt wrote: Reason for CRM: Patient is scheduled for her CPE labs on 02/21/2024; however, she is able to have labs done at the assisted living facility [facility] all they would need is the orders to be faxed. Fax number [phone] Attention Elijiah.

## 2024-10-26 NOTE — Telephone Encounter (Signed)
 Spoke to pt. Advised her that someone will need to call back closer to the time the labs are being done for Dr Copland to put them in.

## 2024-11-07 NOTE — Progress Notes (Unsigned)
 Electrophysiology Clinic Note    Date:  11/08/2024  Patient ID:  Jennifer Schmitt, Jennifer Schmitt [birth year], MRN [id number] PCP:  [name], MD  Cardiologist:  None  Electrophysiologist:  OLE ONEIDA HOLTS, MD  Electrophysiology APP:  Safire Gordin, NP     Discussed the use of AI scribe software for clinical note transcription with the patient, who gave verbal consent to proceed.   Patient Profile    Chief Complaint: AF follow-up  History of Present Illness: Jennifer Schmitt is a 89 y.o. female with PMH notable for persis afib, PVCs, HTN, myasthenia gravis ; seen today for OLE ONEIDA HOLTS, MD for routine electrophysiology followup.   She is not AF ablation candidate d/t myasthenia gravis.  I last saw her 08/2023 where she had not had any recent AF episodes. She was having fatigue with walking, so amio reduced to every other day.   On follow-up today, she continues to have significant lethargy which she attributes to her myasthenia gravis.  She is not aware of any A-fib or PVC symptomatic episodes.  She receives pill packs from pharmacy, and so is unsure whether she is taking amiodarone  every other day.  Her blood pressure is monitored twice a day by health aides at [facility], but she does not recall the readings.  She continues to take Xarelto  daily without significant bleeding concerns.  She tells me that her husband of 60+ years recently passed away, and she is having a very difficult time adjusting to his loss.  Her son works set designer.  She has been speaking with a grief counselor routinely at [facility].  She denies chest pain, chest pressure, palpitations, presyncope, or dizziness.    Arrhythmia/Device History Amiodarone     ROS:  Please see the history of present illness. All other systems are reviewed and otherwise negative.    Physical Exam    VS:  BP 110/60 (BP Location: Left Arm, Patient Position: Sitting, Cuff Size: Large)   Pulse (!) 57   Ht 5' 6  (1.676 m)   Wt 168 lb 6 oz (76.4 kg)   SpO2 98%   BMI 27.18 kg/m  BMI: Body mass index is 27.18 kg/m.           Wt Readings from Last 3 Encounters:  11/08/24 168 lb 6 oz (76.4 kg)  09/27/24 167 lb 2 oz (75.8 kg)  08/29/24 166 lb (75.3 kg)     GEN- The patient is well appearing, alert and oriented x 3 today.   Lungs- Clear to ausculation bilaterally, normal work of breathing.  Heart- Regular rate and rhythm, no murmurs, rubs or gallops Extremities- No peripheral edema, warm, dry   Studies Reviewed   Previous EP, cardiology notes.    EKG is ordered. Personal review of EKG from today shows:    EKG Interpretation Date/Time:  Tuesday November 08 2024 10:57:16 EST Ventricular Rate:  57 PR Interval:  200 QRS Duration:  94 QT Interval:  422 QTC Calculation: 410 R Axis:   -11  Text Interpretation: Sinus bradycardia Confirmed by Fadumo Heng [phone]) on 11/08/2024 11:01:08 AM    TTE, 04/03/2022  1. Left ventricular ejection fraction, by estimation, is 55 to 60%. The left ventricle has normal function. The left ventricle has no regional wall motion abnormalities. Left ventricular diastolic parameters are consistent with Grade I diastolic dysfunction (impaired relaxation). The average left ventricular global longitudinal strain is -18.4 %.   2. Right ventricular systolic function is normal. The right  ventricular size is normal. There is mildly elevated pulmonary artery systolic pressure. The estimated right ventricular systolic pressure is 42.7 mmHg.   3. The mitral valve is normal in structure. No evidence of mitral valve regurgitation. No evidence of mitral stenosis.   4. The aortic valve is tricuspid. Aortic valve regurgitation is mild. Aortic valve sclerosis/calcification is present, without any evidence of aortic stenosis.   5. The inferior vena cava is normal in size with greater than 50% respiratory variability, suggesting right atrial pressure of 3 mmHg.   6. PVCs noted     Long term monitor, 04/01/2022 Patch Wear Time:  3 days and 0 hours (2023-03-30T17:20:10-399 to 2023-04-02T17:33:05-399)   Normal sinus rhythm Patient had a min HR of 49 bpm, max HR of 164 bpm, and avg HR of 73 bpm   3 Supraventricular Tachycardia runs occurred, the run with the fastest interval lasting 5 beats with a max rate of 164 bpm, the longest lasting 5 beats with an avg rate of 114 bpm.   Isolated SVEs were rare (<1.0%), SVE Couplets were rare (<1.0%), and SVE Triplets were rare (<1.0%).    Isolated VEs were frequent (22.5%, 71540), VE Couplets were rare (<1.0%, 165), and VE Triplets were rare (<1.0%, 2). Ventricular Bigeminy and Trigeminy were present.    Triggered events associated with normal sinus rhythm with frequent PVCs    Assessment and Plan     #) persis AFib #) amiodarone  monitoring In normal rhythm today, no symptomatic AFib episodes within recent memory Not AF ablation candidate Will continue 200mg  amiodarone  every other day Update thyroid  labs today Continue 25mg  toprol  daily   #) Hypercoag d/t persis afib CHA2DS2-VASc Score = at least 4 [CHF History: 0, HTN History: 1, Diabetes History: 0, Stroke History: 0, Vascular Disease History: 0, Age Score: 2, Gender Score: 1].  Therefore, the patient's annual risk of stroke is 4.8 %.    Stroke ppx - 20mg  xarelto  daily, appropriately dosed No bleeding concerns Will update CBC today  #) PVC No PVC's on today's EKG Continue amio as above  #) HTN Well-controlled in office today She is unsure of home readings I've asked her to sent BP, pulse log from facility for review Continue metop as above, continue 40mg  valsartan  daily  #) fatigue #) depression, grief Fatigue likely multifactorial cause complicated by recent loss of her husband I've asked her to have her BP, pulse log sent to office for review to ensure she is not significantly bradycardic or hypotensive Updating thyroid  labs as above She has been  working with grief counselor and other staff members which she finds helpful        Current medicines are reviewed at length with the patient today.   The patient does not have concerns regarding her medicines.  The following changes were made today:  none  Labs/ tests ordered today include:  Orders Placed This Encounter  Procedures   CBC   TSH   T4, free   EKG 12-Lead     Disposition: Follow up with Dr. Cindie or EP APP in 6 months   Signed, Tawnya Pujol, NP  11/08/24  12:20 PM  Electrophysiology [facility]

## 2024-11-08 ENCOUNTER — Ambulatory Visit: Payer: Self-pay | Admitting: Cardiology

## 2024-11-08 ENCOUNTER — Ambulatory Visit: Attending: Cardiology | Admitting: Cardiology

## 2024-11-08 ENCOUNTER — Other Ambulatory Visit
Admission: RE | Admit: 2024-11-08 | Discharge: 2024-11-08 | Disposition: A | Discharge status: Home / Self Care | Source: Ambulatory Visit | Attending: Cardiology | Admitting: Cardiology

## 2024-11-08 ENCOUNTER — Encounter: Payer: Self-pay | Admitting: Cardiology

## 2024-11-08 VITALS — BP 110/60 | HR 57 | Ht 66.0 in | Wt 168.4 lb

## 2024-11-08 DIAGNOSIS — I4819 Other persistent atrial fibrillation: Secondary | ICD-10-CM | POA: Diagnosis not present

## 2024-11-08 DIAGNOSIS — Z5181 Encounter for therapeutic drug level monitoring: Secondary | ICD-10-CM | POA: Diagnosis not present

## 2024-11-08 DIAGNOSIS — Z79899 Other long term (current) drug therapy: Secondary | ICD-10-CM | POA: Insufficient documentation

## 2024-11-08 DIAGNOSIS — R5383 Other fatigue: Secondary | ICD-10-CM

## 2024-11-08 DIAGNOSIS — I493 Ventricular premature depolarization: Secondary | ICD-10-CM | POA: Diagnosis not present

## 2024-11-08 DIAGNOSIS — D6869 Other thrombophilia: Secondary | ICD-10-CM | POA: Diagnosis not present

## 2024-11-08 LAB — CBC
HCT: 35.7 % — ABNORMAL LOW (ref 36.0–46.0)
Hemoglobin: 11.7 g/dL — ABNORMAL LOW (ref 12.0–15.0)
MCH: 32.1 pg (ref 26.0–34.0)
MCHC: 32.8 g/dL (ref 30.0–36.0)
MCV: 97.8 fL (ref 80.0–100.0)
Platelets: 209 K/uL (ref 150–400)
RBC: 3.65 MIL/uL — ABNORMAL LOW (ref 3.87–5.11)
RDW: 13.5 % (ref 11.5–15.5)
WBC: 6.6 K/uL (ref 4.0–10.5)
nRBC: 0 % (ref 0.0–0.2)

## 2024-11-08 LAB — T4, FREE: Free T4: 1.09 ng/dL (ref 0.61–1.12)

## 2024-11-08 LAB — TSH: TSH: 1.19 u[IU]/mL (ref 0.350–4.500)

## 2024-11-08 MED ORDER — AMIODARONE HCL 200 MG PO TABS: 200.0000 mg | ORAL_TABLET | ORAL | 1 refills | Status: AC

## 2024-11-08 NOTE — Patient Instructions (Signed)
 Medication Instructions:  Your physician recommends that you continue on your current medications as directed. Please refer to the Current Medication list given to you today.   *If you need a refill on your cardiac medications before your next appointment, please call your pharmacy*  Lab Work: Your provider would like for you to return in few days to have the following labs drawn: TSH, T4, CBC.   Please go to [facility] [address] ([facility] [address], [place] [address] You do not need an appointment.  They are open from 8 am- 4:30 pm.  Lunch from 1:00 pm- 2:00 pm You Do Not need to be fasting.   If you have labs (blood work) drawn today and your tests are completely normal, you will receive your results only by: MyChart Message (if you have MyChart) OR A paper copy in the mail If you have any lab test that is abnormal or we need to change your treatment, we will call you to review the results.  Testing/Procedures: No test ordered today   Follow-Up: At [facility], you and your health needs are our priority.  As part of our continuing mission to provide you with exceptional heart care, our providers are all part of one team.  This team includes your primary Cardiologist (physician) and Advanced Practice Providers or APPs (Physician Assistants and Nurse Practitioners) who all work together to provide you with the care you need, when you need it.  Your next appointment:   6 month(s)  Provider:   Suzann Riddle, NP

## 2024-11-09 ENCOUNTER — Ambulatory Visit: Payer: Self-pay | Admitting: *Deleted

## 2024-11-09 NOTE — Telephone Encounter (Signed)
 I appreciate Dr. Randeen checking the patient.  US  grossly normal with fatty liver

## 2024-11-09 NOTE — Telephone Encounter (Signed)
  FYI Only or Action Required?: FYI only for provider: appointment scheduled on 11/14/24.  Patient was last seen in primary care on 09/27/2024 by Jennifer Schmitt LABOR, MD.  Called Nurse Triage reporting Abdominal Pain.  Symptoms began several weeks ago.  Interventions attempted: Rest, hydration, or home remedies and Dietary changes.  Symptoms are: unchanged.  Triage Disposition: See PCP Within 2 Weeks  Patient/caregiver understands and will follow disposition?: Yes   Patient is having nausea comes and goes and abdominal pain at times that comes and goes. Still can do regular activities but sx have not gotten better since seeing provider in October.              Copied from CRM #[id number]. Topic: Clinical - Red Word Triage >> Nov 09, 2024  9:02 AM Jennifer Schmitt wrote: Reason for CRM: Delon from [facility] (child psychotherapist) called wanting to know if the imaging results have came back from the patient gallbladder. The patient is still having issues and they told her it would be a month before the results would come back. Patient is having the same issues before when she saw [facility] on 10/7 Gallbladder pain and nausea Reason for Disposition  Abdominal pain is a chronic symptom (recurrent or ongoing AND present > 4 weeks)  Answer Assessment - Initial Assessment Questions Appt scheduled for 11/14/24 for evaluation of sx and f/u with provider to review imaging results. Recommended if sx worsen go to UC/ED.      1. LOCATION: Where does it hurt?      Nausea and pain abdomen like before per SW Bevier from [facility]. Patient not with SW now.  2. RADIATION: Does the pain shoot anywhere else? (e.g., chest, back)     No  3. ONSET: When did the pain begin? (e.g., minutes, hours or days ago)      On going  4. SUDDEN: Gradual or sudden onset?     Na  5. PATTERN Does the pain come and go, or is it constant?     Comes and goes  6. SEVERITY: How bad is the pain?  (e.g., Scale  1-10; mild, moderate, or severe)     Mild  7. RECURRENT SYMPTOM: Have you ever had this type of stomach pain before? If Yes, ask: When was the last time? and What happened that time?      Yes October 8. CAUSE: What do you think is causing the stomach pain? (e.g., gallstones, recent abdominal surgery)     Gallbladder issues 9. RELIEVING/AGGRAVATING FACTORS: What makes it better or worse? (e.g., antacids, bending or twisting motion, bowel movement)     na 10. OTHER SYMPTOMS: Do you have any other symptoms? (e.g., back pain, diarrhea, fever, urination pain, vomiting)       Mild pain dull comes and goes. Nausea. No fever no vomting no bloating  11. PREGNANCY: Is there any chance you are pregnant? When was your last menstrual period?       na  Protocols used: Abdominal Pain - Female-A-AH

## 2024-11-14 ENCOUNTER — Encounter: Payer: Self-pay | Admitting: Family Medicine

## 2024-11-14 ENCOUNTER — Ambulatory Visit: Admitting: Family Medicine

## 2024-11-14 ENCOUNTER — Ambulatory Visit: Payer: Self-pay | Admitting: Family Medicine

## 2024-11-14 VITALS — BP 137/65 | HR 58 | Temp 98.8°F | Ht 66.0 in | Wt 167.4 lb

## 2024-11-14 DIAGNOSIS — R110 Nausea: Secondary | ICD-10-CM | POA: Diagnosis not present

## 2024-11-14 DIAGNOSIS — R8290 Unspecified abnormal findings in urine: Secondary | ICD-10-CM

## 2024-11-14 DIAGNOSIS — D510 Vitamin B12 deficiency anemia due to intrinsic factor deficiency: Secondary | ICD-10-CM | POA: Diagnosis not present

## 2024-11-14 DIAGNOSIS — M353 Polymyalgia rheumatica: Secondary | ICD-10-CM

## 2024-11-14 DIAGNOSIS — R1011 Right upper quadrant pain: Secondary | ICD-10-CM

## 2024-11-14 DIAGNOSIS — D649 Anemia, unspecified: Secondary | ICD-10-CM | POA: Diagnosis not present

## 2024-11-14 LAB — POC URINALSYSI DIPSTICK (AUTOMATED)
Bilirubin, UA: NEGATIVE
Glucose, UA: NEGATIVE
Ketones, UA: NEGATIVE
Nitrite, UA: NEGATIVE
Protein, UA: POSITIVE — AB
Spec Grav, UA: 1.01 (ref 1.010–1.025)
Urobilinogen, UA: 0.2 U/dL
pH, UA: 8 (ref 5.0–8.0)

## 2024-11-14 NOTE — Patient Instructions (Addendum)
 Hold your weekly actonel  in case that is causing GI symptoms  Let's check a urinalysis today   Try and sip more fluids   Then I will work on a referral to GI    I will put a referral in for visit with GI specialist   [facility]  [phone] Give them a call tomorrow     Make sure to avoid nsaids (anti inflammatories) like ibuprofen (advil), or naproxen (aleve)  Or aspirin

## 2024-11-14 NOTE — Assessment & Plan Note (Signed)
 No longer has pain like she was  Some discomfort with palpation  Labs reviewed-normal lipase/liver  Mild anemia-per pt baseline   US  reassuring  Hepatic steatosis-doubt cause

## 2024-11-14 NOTE — Assessment & Plan Note (Addendum)
 No vomiting  Did have vomiting and diarrhea prior to last visit- no longer has this  Mild normocytic anemia (per pt normal for her)  Multiple cardiac meds and xarelto   Also actonel  weekly for bone density Also prednisone  and cellcept  from rheumatology Prevacid  for gerd which has worked well in past   Reviewed last labs from oct and 11/18 (here and cardiology)  No changes on exam   Urinalysis today noted wbc and protein Sent for culture to r/o uti  Instructed to hold actonel  on off chance this is causing side effects  Encouraged to check in with specialists   Refer to GI

## 2024-11-14 NOTE — Progress Notes (Signed)
 Subjective:    Patient ID: Jennifer Schmitt, female    DOB: 1935/06/26, 89 y.o.   MRN: [id number]  HPI  Wt Readings from Last 3 Encounters:  11/14/24 167 lb 6 oz (75.9 kg)  11/08/24 168 lb 6 oz (76.4 kg)  09/27/24 167 lb 2 oz (75.8 kg)   27.02 kg/m  Vitals:   11/14/24 1114 11/14/24 1136  BP: (!) 148/82 137/65  Pulse: (!) 58   Temp: 98.8 F (37.1 C)   SpO2: 100%     Pt presents for follow up of episodic RUQ pain  Also decreased hemoglobin   Still has nausea  No vomiting  Decreased appetite   Eating bland Dry cereal /cheerios  Does not drink enough fluids , some coffee   No diarrhea since the last episode Constipation comes and goes (raisin bran helps)      Still occational discomfort under right rib cage - mild discomfort/no pain  Eats from [place]  No particular triggers   Recently -just a little urinary burning  No blood in urine     Last visit ordered US  and labs   Does take actonel  for bone density  Has been on for years   Prednisone  4 mg daily for 2-3 years   Does take prevacid  for GERD  Ultrasound  US  ABDOMEN COMPLETE 10/04/2024 [facility] US  Imaging Results  Procedure Component Value Ref Range Date/Time  US  Abdomen Complete [[id number]] Resulted: 10/05/24 1450  Order Status: Completed Updated: 10/05/24 1451  Narrative:    EXAM: COMPLETE ABDOMINAL ULTRASOUND  TECHNIQUE: Real-time ultrasonography of the abdomen was performed.  COMPARISON: None available.  CLINICAL HISTORY: right upper quadrant pain and nausea/vomiting gallstones/sludge on last CT.  FINDINGS:  LIVER: The liver demonstrates coarse hepatic echotexture, suggesting hepatic steatosis. No intrahepatic biliary ductal dilatation. No mass.  BILIARY SYSTEM: Mild cholesterolosis of the gallbladder wall, benign. No pericholecystic fluid or wall thickening. No cholelithiasis. Negative sonographic Murphy's sign. Common bile duct measures 3 mm.  KIDNEYS: The  right kidney measures 10.2 cm. Normal contour. Normal cortical echogenicity. There is a 2.6 x 3.5 x 3.1 cm right renal cyst, simple, benign. The left kidney measures 10.1 cm. Normal contour. Normal cortical echogenicity. No hydronephrosis. No calculus. No mass.  PANCREAS: The pancreas is incompletely visualized due to overlying bowel gas.  SPLEEN: No acute abnormality. Spleen is within normal limits in size.  VESSELS: Visualized portion of the aorta is normal. Visualized portion of the inferior vena cava is normal.  OTHER: No ascites.  IMPRESSION: 1. No acute findings. 2. Hepatic steatosis.     Lab Results  Component Value Date   NA 139 09/27/2024   K 4.3 09/27/2024   CO2 31 09/27/2024   GLUCOSE 79 09/27/2024   BUN 15 09/27/2024   CREATININE 0.85 09/27/2024   CALCIUM  8.7 09/27/2024   GFR 61.76 09/27/2024   EGFR 67 09/11/2023   GFRNONAA >60 02/28/2024   Lab Results  Component Value Date   ALT 12 09/27/2024   AST 15 09/27/2024   ALKPHOS 45 09/27/2024   BILITOT 0.4 09/27/2024   Lab Results  Component Value Date   LIPASE 21.0 09/27/2024    Lab Results  Component Value Date   TSH 1.190 11/08/2024   Lab Results  Component Value Date   WBC 6.6 11/08/2024   HGB 11.7 (L) 11/08/2024   HCT 35.7 (L) 11/08/2024   MCV 97.8 11/08/2024   PLT 209 11/08/2024   No results found for: IRON, TIBC, FERRITIN  Lab Results  Component Value Date   VITAMINB12 862 11/29/2021    Has been anemic in past -per pt whole life  A lot of illness in the past  Normycytic  Normal renal labs   Urinalysis today  Results for orders placed or performed in visit on 11/14/24  POCT Urinalysis Dipstick (Automated)   Collection Time: 11/14/24 12:44 PM  Result Value Ref Range   Color, UA Yellow    Clarity, UA Clear    Glucose, UA Negative Negative   Bilirubin, UA Negative    Ketones, UA Negative    Spec Grav, UA 1.010 1.010 - 1.025   Blood, UA Trace    pH, UA 8.0 5.0 - 8.0    Protein, UA Positive (A) Negative   Urobilinogen, UA 0.2 0.2 or 1.0 E.U./dL   Nitrite, UA Negative    Leukocytes, UA Large (3+) (A) Negative        Patient Active Problem List   Diagnosis Date Noted   Mild chronic anemia 11/14/2024   Nausea 09/27/2024   Closed compression fracture of fourth lumbar vertebra (HCC) 03/01/2024   Age-related osteoporosis with current pathological fracture 03/01/2024   Abnormal urinalysis 03/01/2024   RUQ pain 11/16/2023   Myasthenia gravis (HCC) 05/06/2022   MCI (mild cognitive impairment) 11/09/2019   Overactive bladder 11/23/2017   Neuropathy 10/15/2016   Pernicious anemia 04/11/2015   Mood disorder 04/11/2015   Advanced directives, counseling/discussion 10/10/2014   Obstructive sleep apnea 04/20/2014   Essential hypertension    Mixed hyperlipidemia    GERD without esophagitis    Seasonal allergic rhinitis due to pollen    Atrial fibrillation (HCC)    PMR (Resolved)    Past Medical History:  Diagnosis Date   Adenomatous colon polyp    Allergic rhinitis due to pollen    Allergy childhood   Anemia 2014   Anxiety    Cataract    Removed 2019   Degenerative disc disease    cervical and lumbar   GERD (gastroesophageal reflux disease)    Heart murmur    Hyperlipidemia    Hypertension    Osteoarthritis, multiple sites    PAF (paroxysmal atrial fibrillation) (HCC)    a. 01/2013 [facility]): EF 60-65%, no rwma, mildly dil LA/RA, mild AI/TR, trace MR, nl RV size/fxn, mild PAH; b. CHA2DS2VASc = 4-->Chronic Xarelto .   PMR (polymyalgia rheumatica)    Rheumatic fever    Sleep apnea    NO CPAP   Urge incontinence    Past Surgical History:  Procedure Laterality Date   ABDOMINAL HYSTERECTOMY  2003   ANKLE FRACTURE SURGERY Left 1996   BACK SURGERY     BREAST BIOPSY Left 2010   negative   CATARACT EXTRACTION W/PHACO Left 10/05/2018   Procedure: CATARACT EXTRACTION PHACO AND INTRAOCULAR LENS PLACEMENT (IOC);  Surgeon: Jaye Fallow, MD;  Location: [facility] ORS;  Service: Ophthalmology;  Laterality: Left;  US  00:40 CDE 6.92 fluid pack lot # [id number]      CATARACT EXTRACTION W/PHACO Right 10/26/2018   Procedure: CATARACT EXTRACTION PHACO AND INTRAOCULAR LENS PLACEMENT (IOC);  Surgeon: Jaye Fallow, MD;  Location: [facility] ORS;  Service: Ophthalmology;  Laterality: Right;  US   00:47 CDE 7.11 Fluid pack lot # [id number]   EYE SURGERY  2019   FRACTURE SURGERY  1996   Meningitis  1963   SPINE SURGERY     SYNOVECTOMY Right 1973   elbow   TONSILLECTOMY AND ADENOIDECTOMY     Social History  Tobacco Use   Smoking status: Never    Passive exposure: Never   Smokeless tobacco: Never  Vaping Use   Vaping status: Never Used  Substance Use Topics   Alcohol use: Not Currently   Drug use: No   Family History  Problem Relation Age of Onset   Schizophrenia Mother        paranoid   Heart disease Mother        from psych meds   Arthritis Mother    Rheum arthritis Mother    Cancer Father        prostate   Arthritis Father    Diabetes Father    Breast cancer Paternal Aunt 39   Ovarian cancer Maternal Grandmother    Uterine cancer Maternal Grandmother    Colon cancer Maternal Grandmother    Healthy Son    Allergies  Allergen Reactions   Diltiazem  Hcl Other (See Comments)    Drops blood pressure drastically.   Actonel  [Risedronate  Sodium] Nausea And Vomiting   Boniva [Ibandronate] Nausea And Vomiting   Diclofenac  Other (See Comments)    Voltaren  Gel aggravated her sinus passages   Fosamax [Alendronate Sodium] Nausea And Vomiting   Lipitor [Atorvastatin] Other (See Comments)    Muscle aches. Tolerates Crestor .   Miacalcin [Calcitonin ([place])] Other (See Comments)    Sneezing    Myrbetriq  [Mirabegron ] Other (See Comments)    Gastritis   Sertraline  Hcl Other (See Comments)    Irritable, etc   Talwin [Pentazocine] Other (See Comments)    Hallucinations    Latex Rash    Sneezing, watery eyes.   Niaspan  [Niacin Er (Antihyperlipidemic)] Swelling and Rash   Current Outpatient Medications on File Prior to Visit  Medication Sig Dispense Refill   acetaminophen (TYLENOL) 500 MG tablet Take 1,000 mg by mouth as needed.     amiodarone  (PACERONE ) 200 MG tablet Take 1 tablet (200 mg total) by mouth every other day. 90 tablet 1   calcium  carbonate (CALCIUM  600) 600 MG TABS tablet Take 1 tablet (600 mg total) by mouth daily. 30 tablet 0   Cholecalciferol (VITAMIN D) 2000 units CAPS Take 2,000 Units by mouth daily.     cyanocobalamin  (VITAMIN B12) 1000 MCG/ML injection INJECT ONE ML MONTHLY AS INSTRUCTED 1 mL 11   diltiazem  (CARDIZEM ) 30 MG tablet Take 1 tablet (30 mg total) by mouth 3 (three) times daily as needed (atrial fibrillation lasting more than 2 hours). 60 tablet 3   fluticasone (FLONASE) 50 MCG/ACT nasal spray Place 1 spray into both nostrils daily.     folic acid  (FOLVITE ) 1 MG tablet TAKE ONE TABLET BY MOUTH DAILY 90 tablet 3   lansoprazole  (PREVACID ) 15 MG capsule TAKE ONE CAPSULE BY MOUTH DAILY AT 12 (NOON) 30 capsule 5   LORazepam  (ATIVAN ) 0.5 MG tablet Take 0.5 tablets (0.25 mg total) by mouth 2 (two) times daily as needed for anxiety. 30 tablet 0   metoprolol  succinate (TOPROL -XL) 25 MG 24 hr tablet TAKE ONE TABLET BY MOUTH DAILY 90 tablet 0   MIEBO 1.338 GM/ML SOLN Apply 1 drop to eye daily.     Multiple Vitamins-Minerals (PRESERVISION AREDS 2 PO) Take by mouth daily.     mycophenolate  (CELLCEPT ) 500 MG tablet TAKE ONE TABLET (500 MG TOTAL) BY MOUTH TWO (TWO) TIMES DAILY. 60 tablet 11   ondansetron  (ZOFRAN ) 4 MG tablet Take 1 tablet (4 mg total) by mouth 3 (three) times daily as needed for nausea or vomiting. 30 tablet 0  predniSONE  (DELTASONE ) 1 MG tablet Take 4 tablets (4 mg total) by mouth daily with breakfast. 120 tablet 11   rosuvastatin  (CRESTOR ) 5 MG tablet TAKE ONE TABLET BY MOUTH ONCE A DAY 90 tablet 3   Trolamine Salicylate (ASPERCREME EX) Apply topically as needed.      valsartan  (DIOVAN ) 80 MG tablet TAKE HALF A TABLET BY MOUTH DAILY 90 tablet 0   XARELTO  20 MG TABS tablet TAKE ONE TABLET BY MOUTH ONCE A DAY WITH THE EVENING MEAL. 90 tablet 1   risedronate  (ACTONEL ) 150 MG tablet Take 1 tablet (150 mg total) by mouth every 30 (thirty) days. with water on empty stomach, nothing by mouth or lie down for next 30 minutes. (Patient not taking: Reported on 11/14/2024) 3 tablet 3   No current facility-administered medications on file prior to visit.    Review of Systems  Constitutional:  Negative for activity change, appetite change, fatigue, fever and unexpected weight change.  HENT:  Negative for congestion, ear pain, rhinorrhea, sinus pressure and sore throat.   Eyes:  Negative for pain, redness and visual disturbance.  Respiratory:  Negative for cough, shortness of breath and wheezing.   Cardiovascular:  Negative for chest pain and palpitations.  Gastrointestinal:  Positive for constipation and nausea. Negative for abdominal pain, anal bleeding, blood in stool, diarrhea, rectal pain and vomiting.       RUQ abdominal discomfort /not pain   Nausea w/o vomiting    Endocrine: Negative for polydipsia and polyuria.  Genitourinary:  Negative for dysuria, frequency and urgency.  Musculoskeletal:  Negative for arthralgias, back pain and myalgias.  Skin:  Negative for pallor and rash.  Allergic/Immunologic: Negative for environmental allergies.  Neurological:  Negative for dizziness, syncope and headaches.  Hematological:  Negative for adenopathy. Does not bruise/bleed easily.  Psychiatric/Behavioral:  Negative for decreased concentration and dysphoric mood. The patient is not nervous/anxious.        Objective:   Physical Exam Constitutional:      General: She is not in acute distress.    Appearance: Normal appearance. She is well-developed.     Comments: Overweight   HENT:     Head: Normocephalic and atraumatic.     Mouth/Throat:     Mouth: Mucous  membranes are moist.  Eyes:     Conjunctiva/sclera: Conjunctivae normal.     Pupils: Pupils are equal, round, and reactive to light.  Neck:     Thyroid : No thyromegaly.     Vascular: No carotid bruit or JVD.  Cardiovascular:     Rate and Rhythm: Normal rate and regular rhythm.     Heart sounds: Normal heart sounds.     No gallop.  Pulmonary:     Effort: Pulmonary effort is normal. No respiratory distress.     Breath sounds: Normal breath sounds. No wheezing or rales.  Abdominal:     General: Abdomen is protuberant. Bowel sounds are normal. There is no distension or abdominal bruit.     Palpations: Abdomen is soft. There is no hepatomegaly, splenomegaly, mass or pulsatile mass.     Tenderness: There is abdominal tenderness in the right upper quadrant and epigastric area. There is no right CVA tenderness, left CVA tenderness, guarding or rebound. Negative signs include Murphy's sign.     Hernia: No hernia is present.     Comments: Mild discomfort (per pt pressure, not pain) with palpation in RUQ and epigastrium   No obvious hernia   Musculoskeletal:     Cervical back:  Normal range of motion and neck supple.     Right lower leg: No edema.     Left lower leg: No edema.  Lymphadenopathy:     Cervical: No cervical adenopathy.  Skin:    General: Skin is warm and dry.     Coloration: Skin is not pale.     Findings: No rash.  Neurological:     Mental Status: She is alert.     Coordination: Coordination normal.     Deep Tendon Reflexes: Reflexes are normal and symmetric. Reflexes normal.  Psychiatric:        Mood and Affect: Mood normal.           Assessment & Plan:   Problem List Items Addressed This Visit       Other   PMR (Resolved) (Chronic)   Controlled with prednisone  4 mg daily      RUQ pain   No longer has pain like she was  Some discomfort with palpation  Labs reviewed-normal lipase/liver  Mild anemia-per pt baseline   US  reassuring  Hepatic  steatosis-doubt cause        Pernicious anemia   Last hb 11.7 B12 normal in 2022      Nausea - Primary   No vomiting  Did have vomiting and diarrhea prior to last visit- no longer has this  Mild normocytic anemia (per pt normal for her)  Multiple cardiac meds and xarelto   Also actonel  weekly for bone density Also prednisone  and cellcept  from rheumatology Prevacid  for gerd which has worked well in past   Reviewed last labs from oct and 11/18 (here and cardiology)  No changes on exam   Urinalysis today noted wbc and protein Sent for culture to r/o uti  Instructed to hold actonel  on off chance this is causing side effects  Encouraged to check in with specialists   Refer to GI       Relevant Orders   POCT Urinalysis Dipstick (Automated) (Completed)   Urine Culture   Mild chronic anemia   11.7 hb  Per pt chronic /intermittent and life long  Normocytic  Normal renal labs Per [place]         Abnormal urinalysis   Urinalysis with wbc and protein   Pending culture       Relevant Orders   Urine Culture

## 2024-11-14 NOTE — Assessment & Plan Note (Signed)
 11.7 hb  Per pt chronic /intermittent and life long  Normocytic  Normal renal labs Per pt [place]

## 2024-11-14 NOTE — Assessment & Plan Note (Signed)
 Urinalysis with wbc and protein   Pending culture

## 2024-11-14 NOTE — Telephone Encounter (Signed)
 Copied from CRM #[id number]. Topic: Clinical - Lab/Test Results >> Nov 14, 2024  4:27 PM Jennifer Schmitt wrote: Reason for CRM: Patient called to follow up on previous message regarding results. She stated that she is not taking Actonel .

## 2024-11-14 NOTE — Assessment & Plan Note (Signed)
 Last hb 11.7 B12 normal in 2022

## 2024-11-14 NOTE — Assessment & Plan Note (Signed)
 Controlled with prednisone  4 mg daily

## 2024-11-15 LAB — URINE CULTURE
MICRO NUMBER:: 17275259
Result:: NO GROWTH
SPECIMEN QUALITY:: ADEQUATE

## 2024-11-16 ENCOUNTER — Other Ambulatory Visit: Payer: Self-pay

## 2024-11-16 MED ORDER — LORAZEPAM 0.5 MG PO TABS: 0.2500 mg | ORAL_TABLET | Freq: Two times a day (BID) | ORAL | 1 refills | Status: AC | PRN

## 2024-11-16 NOTE — Telephone Encounter (Signed)
 Lorazepam  last filled 05-05-24 #30 Last OV Acute 11-14-24 Next OV 02-27-25 [facility]

## 2024-11-27 NOTE — Progress Notes (Deleted)
 Jennifer Schmitt T. Sidney Kann, MD, CAQ Sports Medicine [facility] at [facility] [address] [place], [address]  Phone: [phone]  FAX: [phone]  Jennifer Schmitt - 89 y.o. female  MRN [id number]  Date of Birth: May 05, 1935  Date: 11/28/2024  PCP: [name], MD  Referral: [name], MD  No chief complaint on file.  Subjective:   Jennifer Schmitt is a 89 y.o. very pleasant female patient with There is no height or weight on file to calculate BMI. who presents with the following:  Discussed the use of AI scribe software for clinical note transcription with the patient, who gave verbal consent to proceed.  Patient is here for follow-up regarding some nausea and gallbladder.  She is seeing Dr. Randeen a few times in the recent past for this.  She does have some cholesterol deposits in the gallbladder. Dr. Randeen has referred her to gastroenterology. History of Present Illness     Review of Systems is noted in the HPI, as appropriate  Objective:   There were no vitals taken for this visit.  GEN: No acute distress; alert,appropriate. PULM: Breathing comfortably in no respiratory distress PSYCH: Normally interactive.   Laboratory and Imaging Data:  Assessment and Plan:   No diagnosis found. Assessment & Plan   Medication Management during today's office visit: No orders of the defined types were placed in this encounter.  There are no discontinued medications.  Orders placed today for conditions managed today: No orders of the defined types were placed in this encounter.   Disposition: No follow-ups on file.  [employer] One speech-to-text software was used for transcription in this dictation.  Possible transcriptional errors can occur using Animal nutritionist.   Signed,  Mirza DASEN. Jennifer Kotara, MD   Outpatient Encounter Medications as of 11/28/2024  Medication Sig   acetaminophen (TYLENOL) 500 MG tablet Take 1,000 mg by  mouth as needed.   amiodarone  (PACERONE ) 200 MG tablet Take 1 tablet (200 mg total) by mouth every other day.   calcium  carbonate (CALCIUM  600) 600 MG TABS tablet Take 1 tablet (600 mg total) by mouth daily.   Cholecalciferol (VITAMIN D) 2000 units CAPS Take 2,000 Units by mouth daily.   cyanocobalamin  (VITAMIN B12) 1000 MCG/ML injection INJECT ONE ML MONTHLY AS INSTRUCTED   diltiazem  (CARDIZEM ) 30 MG tablet Take 1 tablet (30 mg total) by mouth 3 (three) times daily as needed (atrial fibrillation lasting more than 2 hours).   fluticasone (FLONASE) 50 MCG/ACT nasal spray Place 1 spray into both nostrils daily.   folic acid  (FOLVITE ) 1 MG tablet TAKE ONE TABLET BY MOUTH DAILY   lansoprazole  (PREVACID ) 15 MG capsule TAKE ONE CAPSULE BY MOUTH DAILY AT 12 (NOON)   LORazepam  (ATIVAN ) 0.5 MG tablet Take 0.5 tablets (0.25 mg total) by mouth 2 (two) times daily as needed for anxiety.   metoprolol  succinate (TOPROL -XL) 25 MG 24 hr tablet TAKE ONE TABLET BY MOUTH DAILY   MIEBO 1.338 GM/ML SOLN Apply 1 drop to eye daily.   Multiple Vitamins-Minerals (PRESERVISION AREDS 2 PO) Take by mouth daily.   mycophenolate  (CELLCEPT ) 500 MG tablet TAKE ONE TABLET (500 MG TOTAL) BY MOUTH TWO (TWO) TIMES DAILY.   ondansetron  (ZOFRAN ) 4 MG tablet Take 1 tablet (4 mg total) by mouth 3 (three) times daily as needed for nausea or vomiting.   predniSONE  (DELTASONE ) 1 MG tablet Take 4 tablets (4 mg total) by mouth daily with breakfast.   risedronate  (ACTONEL ) 150 MG  tablet Take 1 tablet (150 mg total) by mouth every 30 (thirty) days. with water on empty stomach, nothing by mouth or lie down for next 30 minutes. (Patient not taking: Reported on 11/14/2024)   rosuvastatin  (CRESTOR ) 5 MG tablet TAKE ONE TABLET BY MOUTH ONCE A DAY   Trolamine Salicylate (ASPERCREME EX) Apply topically as needed.   valsartan  (DIOVAN ) 80 MG tablet TAKE HALF A TABLET BY MOUTH DAILY   XARELTO  20 MG TABS tablet TAKE ONE TABLET BY MOUTH ONCE A DAY WITH  THE EVENING MEAL.   No facility-administered encounter medications on file as of 11/28/2024.

## 2024-11-28 ENCOUNTER — Ambulatory Visit: Admitting: Family Medicine

## 2024-11-28 DIAGNOSIS — R1011 Right upper quadrant pain: Secondary | ICD-10-CM

## 2024-11-28 DIAGNOSIS — R110 Nausea: Secondary | ICD-10-CM

## 2024-11-29 ENCOUNTER — Other Ambulatory Visit: Payer: Self-pay | Admitting: Neurology

## 2024-11-29 NOTE — Progress Notes (Signed)
 I saw Jennifer Schmitt in neurology clinic on 12/08/24 in follow up for AChR ab positive myasthenia gravis.  HPI: Jennifer Schmitt is a 89 y.o. year old female with a history of seropositive myasthenia gravis, polymyalgia rheumatica, HTN, HLD, pAfib, and OA who we last saw on 06/02/24.  To briefly review: Patient was previously seen in this office by Dr. Skeet (last seen on 06/05/22). Per initial clinic note on 05/08/22: Since January-February 2023, she has had progressive weakness.  She began having trouble rolling over in bed.  She was unable to get up and off her bed so she started sleeping on the couch..  Noted weakness in the left leg.  Seems to be dragging her right leg.  When standing, always with sensation that legs will give out. She has had falls and unable to stand up, requiring to call EMS.  Notes particular weakness in the proximal arms and trouble keeping her head up.  Started relying on a cane more frequently and then wheelchair.  She also endorsed worsening musculoskeletal pain with pain from the neck down her spine to her sacrum.  She does have degenerative spine disease and polymyalgia rheumatica.  MRI of brain with and without contrast on 04/27/2022 personally reviewed showed moderate chronic small vessel ischemic changes and known 1 x 2 mm vestibular schwannoma within the right internal auditory canal, stable compared to prior imaging in 2017.  She started noticing that her left eyelid was drooping.  She went to the ophthalmologist who suspected temporal arteritis given her history of PMR.  No vision loss.  Sed rate and CRP on 04/23/2022 were 101 and 10.1 respectively and was started on prednisone  60mg  daily with some improvement.  Based on strong likelihood of giant cell arteritis with elevated inflammatory markers and having PMR, temporal artery biopsy was not pursued.  She was started on prednisone  60mg  daily but discontinued after 3 days due to side effects.  She also has had increased  urinary urgency and sometimes fecal incontinence.  Due to sacral pain following a fall, she had X-ray of lumbar spine and MRI of sacrum which were negative for occult fracture.  AChR Binding Ab on 04/28/2022 was elevated at 28.10.  PCP started her on pyridostigmine  30mg  three times daily.  She notes improvement in strength in the legs.  However, she still has a head drop.  Denies double vision, trouble swallowing, trouble chewing, and trouble taking a breath.   At 05/08/22 visit, Dr. Skeet recommended prednisone  20 mg daily, mestinon  60 mg TID (7 am, 1 pm, 7 pm). CT chest on 05/15/22 was negative for mediastinal mass (no evidence of thymoma). Patient discontinued prednisone  shortly after starting as it made her jittery and not able to sleep. At follow up with Dr. Skeet on 06/05/22, patient was not feeling well, but was walking a little better. She still had head drop. Hospital admission for a course of IVIg was suggested to prevent MG crisis, but patient declined as she needed to be home to take care of her husband with Alzheimer's disease. Cellcept  500 mg BID was started with IVIg every 28 days until Cellcept  was therapeutic. She was also continued on mestinon . CBC and CMP were set up to be check weekly for 4 weeks, followed by monthly for 3 months. Patient called the next day though because she did not want IVIg. She decided to restart prednisone  instead.   She felt much improved on 09/05/22. She is about 75% better than prior visit.  Patient lives at a retirement with healthcare and PT/OT. She has many resources there. Patient went to PT and OT. She is currently done with both. Husband is currently in a memory care unit twice a week.   Current medications: Cellcept  500 mg BID, Prednisone  20 mg once in the morning, Mestinon  60 mg TID (7:30-8am, 12pm, 8-9pm).   Side effects: Cellcept  is difficult to take on a empty stomach. She gets a little nausea but this is moderately improved. She has hot flashes or  temperature fluctuations that are episodic. She is otherwise not having significant side effects.   MG symptoms on 09/05/22: Ptosis: Some drooping on left, pretty constant; improved compared to prior, worse when tired, not bothersome Double vision: Never Speech: Occasionally slurred speech, especially when tired; no one else commenting on it per patient Chewing: No problems currently Swallowing: Occasionally, episodic difficulty requiring a double swallow; no coughing Breathing: No problems currently Arm strength: No problems; Neck is still weak, but much improved. Cannot sit up straight for long periods of time Leg strength: Walks with a walker. She feels like her legs are strong. She has a walker due to not being able to stand up right.   At her visit on 09/05/22, we continued prednisone  20 mg daily, Cellcept  500 mg BID, but started tapering her mestinon  (stopped on 09/11/22). I called the patient on 09/12/22 at which time she noted no change in her symptoms off mestinon . As a result, we agreed to begin tapering her prednisone . She decreased to 15 mg on 09/13/22.   At 02/12/23 visit, patient was doing well with minimal symptoms. She was on prednisone  10 mg daily Cellcept  500 mg BID and not taking mestinon . Prednisone  was decreased to 7.5 mg daily on 02/12/23 (other medications kept the same). She mentioned neck and back pain and numbness in the 5th digit of bilateral hands at this visit.   At 05/01/23 visit, patient was doing well from an MG perspective. There was some confusion on whether she was currently on 5 mg or 7.5 mg of prednisone  (supposed to be on 7.5 mg but thinks she was taking 5 mg).   At 08/05/23 visit, patient was doing well from an MG perspective. She did mention a mild increase in chronic headaches (1 per week) and occasional muscle stiffness. Her prednisone  was decreased to 4 mg daily.   03/01/24: Patient went to ED on 02/28/24 for right flank pain and difficulty walking as a result. She  was seen at urgent care a couple of weeks prior and thought to have UTI, so given antibiotics that helped. Symptoms then returned and were severe on the left. CT scan showed an L4 compression fracture that was felt to be causing her symptoms. Dr. Deatrice of neurosurgery recommended LSO brace and outpatient follow up. She is wearing the lumbar brace. She does think this helps, but she cannot wear when she is sleeping and this gives her more pain when not in the brace.   She endorses numbness in fingers and toes for a couple of years. She thinks symptoms are stable but has noticed this more of late.   Current MG symptoms: Ptosis: occasional by someone noticing Speech: mild slurring Swallowing: feels things are not going down as well   Current medications:  -Cellcept  500 mg BID -Not been taking Prednisone  4 mg for about 1 month -Mestinon  60 mg PRN - taking morning and night  06/02/24: Current MG symptoms: occasional mild ptosis Tingling in feet and hands, right  more than left Taking Cellcept  500 mg BID, Pred 5 mg, Mestinon  60 mg PRN Ppx: Calcium  600 mg daily, Vit D 2000 international units daily  Most recent Assessment and Plan (06/02/24): This is Jennifer Schmitt, a 89 y.o. female with: AChR ab positive generalized myasthenia gravis (symptom onset 12/2021 with diagnosis 04/28/2022). CT chest on 05/13/22 with no mediastinal mass. She is currently doing well with minimal symptom manifestation on Cellcept  and low dose prednisone . Numbness and tingling in hands and feet - examination with mild vibratory sensation loss in both feet today. May be due to a mild distal symmetric neuropathy. She has no recent falls.   Plan: -CBC w/ diff and CMP - will check at next visit to monitor for Cellcept  toxicity -Reduce Prednisone  4 mg daily -Mestinon  60 mg TID PRN -Cellcept  500 mg BID -Continue staying active -Discussed MG crisis, what to watch for, and when to go to nearest ED including difficulty breathing or  swallowing   For bone health: -Vit D 1000 international units daily -Calcium  intake of 1000-1200 mg/day (diet or supplemental)   -Fall precautions discussed  Since their last visit: Patient is doing well. She is still grieving the loss of her husband and his sister. She endorses some fatigue.  The numbness and tingling is similar to prior.  Current MG symptoms: Ptosis: maybe a little, but maybe due to dry eyes Double vision: No Speech: Occasional slurring when tired Chewing: none Swallowing: none Breathing: none Arm strength: no Leg strength: no; no falls  Current medications:  -Prednisone  4 mg daily -Cellcept  500 mg BID -Mestinon  60 mg as needed (not taking)  Ppx: -Vit D 2000 units -Calcium  600 mg  Side effects: none   MEDICATIONS:  Outpatient Encounter Medications as of 12/08/2024  Medication Sig   acetaminophen (TYLENOL) 500 MG tablet Take 1,000 mg by mouth as needed.   amiodarone  (PACERONE ) 200 MG tablet Take 1 tablet (200 mg total) by mouth every other day.   calcium  carbonate (CALCIUM  600) 600 MG TABS tablet Take 1 tablet (600 mg total) by mouth daily.   Cholecalciferol (VITAMIN D) 2000 units CAPS Take 2,000 Units by mouth daily.   cyanocobalamin  (VITAMIN B12) 1000 MCG/ML injection INJECT ONE ML MONTHLY AS INSTRUCTED   diltiazem  (CARDIZEM ) 30 MG tablet Take 1 tablet (30 mg total) by mouth 3 (three) times daily as needed (atrial fibrillation lasting more than 2 hours).   fluticasone (FLONASE) 50 MCG/ACT nasal spray Place 1 spray into both nostrils daily.   folic acid  (FOLVITE ) 1 MG tablet TAKE ONE TABLET BY MOUTH DAILY   lansoprazole  (PREVACID ) 15 MG capsule TAKE ONE CAPSULE BY MOUTH DAILY AT NOON   LORazepam  (ATIVAN ) 0.5 MG tablet Take 0.5 tablets (0.25 mg total) by mouth 2 (two) times daily as needed for anxiety.   metoprolol  succinate (TOPROL -XL) 25 MG 24 hr tablet TAKE ONE TABLET BY MOUTH DAILY   MIEBO 1.338 GM/ML SOLN Apply 1 drop to eye daily.   Multiple  Vitamins-Minerals (PRESERVISION AREDS 2 PO) Take by mouth daily.   mycophenolate  (CELLCEPT ) 500 MG tablet TAKE ONE TABLET (500 MG TOTAL) BY MOUTH TWO (TWO) TIMES DAILY.   ondansetron  (ZOFRAN ) 4 MG tablet Take 1 tablet (4 mg total) by mouth 3 (three) times daily as needed for nausea or vomiting.   predniSONE  (DELTASONE ) 1 MG tablet Take 4 tablets (4 mg total) by mouth daily with breakfast.   risedronate  (ACTONEL ) 150 MG tablet Take 1 tablet (150 mg total) by mouth every 30 (thirty)  days. with water on empty stomach, nothing by mouth or lie down for next 30 minutes.   Trolamine Salicylate (ASPERCREME EX) Apply topically as needed.   valsartan  (DIOVAN ) 80 MG tablet TAKE HALF A TABLET BY MOUTH DAILY   XARELTO  20 MG TABS tablet TAKE ONE TABLET BY MOUTH ONCE A DAY WITH THE EVENING MEAL.   rosuvastatin  (CRESTOR ) 5 MG tablet Take 1 tablet (5 mg total) by mouth daily.   [DISCONTINUED] rosuvastatin  (CRESTOR ) 5 MG tablet TAKE ONE TABLET BY MOUTH ONCE A DAY   No facility-administered encounter medications on file as of 12/08/2024.    PAST MEDICAL HISTORY: Past Medical History:  Diagnosis Date   Adenomatous colon polyp    Allergic rhinitis due to pollen    Allergy childhood   Anemia 2014   Anxiety    Cataract    Removed 2019   Degenerative disc disease    cervical and lumbar   GERD (gastroesophageal reflux disease)    Heart murmur    Hyperlipidemia    Hypertension    Osteoarthritis, multiple sites    PAF (paroxysmal atrial fibrillation) (HCC)    a. 01/2013 Echo [facility]): EF 60-65%, no rwma, mildly dil LA/RA, mild AI/TR, trace MR, nl RV size/fxn, mild PAH; b. CHA2DS2VASc = 4-->Chronic Xarelto .   PMR (polymyalgia rheumatica)    Rheumatic fever    Sleep apnea    NO CPAP   Urge incontinence     PAST SURGICAL HISTORY: Past Surgical History:  Procedure Laterality Date   ABDOMINAL HYSTERECTOMY  2003   ANKLE FRACTURE SURGERY Left 1996   BACK SURGERY     BREAST BIOPSY Left 2010    negative   CATARACT EXTRACTION W/PHACO Left 10/05/2018   Procedure: CATARACT EXTRACTION PHACO AND INTRAOCULAR LENS PLACEMENT (IOC);  Surgeon: Jaye Fallow, MD;  Location: [facility] ORS;  Service: Ophthalmology;  Laterality: Left;  US  00:40 CDE 6.92 fluid pack lot # [id number]      CATARACT EXTRACTION W/PHACO Right 10/26/2018   Procedure: CATARACT EXTRACTION PHACO AND INTRAOCULAR LENS PLACEMENT (IOC);  Surgeon: Jaye Fallow, MD;  Location: [facility] ORS;  Service: Ophthalmology;  Laterality: Right;  US   00:47 CDE 7.11 Fluid pack lot # [id number]   EYE SURGERY  2019   FRACTURE SURGERY  1996   Meningitis  1963   SPINE SURGERY     SYNOVECTOMY Right 1973   elbow   TONSILLECTOMY AND ADENOIDECTOMY      ALLERGIES: Allergies  Allergen Reactions   Diltiazem  Hcl Other (See Comments)    Drops blood pressure drastically.   Actonel  [Risedronate  Sodium] Nausea And Vomiting   Boniva [Ibandronate] Nausea And Vomiting   Diclofenac  Other (See Comments)    Voltaren  Gel aggravated her sinus passages   Fosamax [Alendronate Sodium] Nausea And Vomiting   Lipitor [Atorvastatin] Other (See Comments)    Muscle aches. Tolerates Crestor .   Miacalcin [Calcitonin ([place])] Other (See Comments)    Sneezing    Myrbetriq  [Mirabegron ] Other (See Comments)    Gastritis   Sertraline  Hcl Other (See Comments)    Irritable, etc   Talwin [Pentazocine] Other (See Comments)    Hallucinations    Latex Rash    Sneezing, watery eyes.   Niaspan [Niacin Er (Antihyperlipidemic)] Swelling and Rash    FAMILY HISTORY: Family History  Problem Relation Age of Onset   Schizophrenia Mother        paranoid   Heart disease Mother        from psych meds   Arthritis  Mother    Rheum arthritis Mother    Cancer Father        prostate   Arthritis Father    Diabetes Father    Breast cancer Paternal Aunt 33   Ovarian cancer Maternal Grandmother    Uterine cancer Maternal Grandmother    Colon cancer Maternal Grandmother     Healthy Son     SOCIAL HISTORY: Social History   Tobacco Use   Smoking status: Never    Passive exposure: Never   Smokeless tobacco: Never  Vaping Use   Vaping status: Never Used  Substance Use Topics   Alcohol use: Not Currently   Drug use: No   Social History   Social History Narrative   Retired 2007    Widowed 5/25   1 son in [place] area   Spends part time in [place]    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 No distress. Cooperative with exam.  Skin: No obvious rash or jaundice. HEENT: Atraumatic. Anicteric. Lungs: Non-labored breathing on room air   Neurological: Mental Status: Alert. Speech fluent. No pseudobulbar affect Cranial Nerves: CNII: No RAPD. Visual fields intact. CNIII, IV, VI: PERRL. No nystagmus. EOMI. CN V: Facial sensation intact bilaterally to fine touch. CN VII: Facial muscles symmetric and strong. No ptosis at rest or after sustained upgaze. CN VIII: Hears finger rub well bilaterally. CN IX: No hypophonia. CN X: Palate elevates symmetrically. CN XI: Full strength shoulder shrug bilaterally. CN XII: Tongue protrusion full and midline. No atrophy or fasciculations. No significant dysarthria Motor: Tone is normal. Strength 5/5 in bilateral upper and lower extremities Reflexes:  Right Left  Bicep 2+ 2+  Tricep 2+ 2+  BrRad 2+ 2+  Knee 2+ 2+  Ankle 1+ 1+   Sensation: Diminished vibration in bilateral great toes, otherwise intact Coordination: Intact finger-to- nose-finger bilaterally.  Gait: Walks with walker. Narrow based, stooped. Cautious steps.   Lab and Test Review: New  results: 11/08/24: CBC significant for Hb 11.7, MCV 97.8 TSH wnl  09/27/24: Hepatic function panel unremarkable BMP unremarkable  Previously reviewed results: 02/28/24: CBC w/ diff unremarkable BMP unremarkable   09/11/23: TSH wnl Free T4 wnl CMP unremarkable  CBC w/ diff (05/01/23): unremarkable  03/13/23: CMP: unremarkable TSH: 1.963   10/08/22: CBC and CMP wnl   08/27/22: Normal or unremarkable: CBC, CMP, TSH   08/15/22: Normal ESR and CRP   AChR binding ab: positive at 28.10   CT chest (05/15/22): FINDINGS: Cardiovascular: Moderate aortic atherosclerosis. No aneurysm. Coronary vascular calcification. Normal cardiac size. No pericardial effusion   Mediastinum/Nodes: Midline trachea. No thyroid  mass. Subcarinal node measuring 12 mm. Esophagus is normal.   Lungs/Pleura: Mild bronchiectasis in the right middle lobe and bilateral lower lobes with scarring. No acute airspace disease, pleural effusion or pneumothorax.   Upper Abdomen: No acute abnormality.   Musculoskeletal: No chest wall abnormality. No acute or significant osseous findings.   IMPRESSION: 1. Negative for mediastinal mass lesion. 2. Mild bronchiectasis in  the right middle and bilateral lower lobes with pulmonary scarring  ASSESSMENT: This is Jennifer Schmitt, a 89 y.o. female with: AChR ab positive generalized myasthenia gravis (symptom onset 12/2021 with diagnosis 04/28/2022). CT chest on 05/13/22 with no mediastinal mass. She is currently doing well with minimal symptom manifestation on Cellcept  and low dose prednisone . Numbness and tingling in hands and feet - examination with mild vibratory sensation loss in both feet. May be due to a mild distal symmetric neuropathy. She has no recent falls.  Plan: -Will check CBC w/ diff and CMP at next visit -Reduce Prednisone  3 mg daily -Mestinon  60 mg TID PRN - can take prior to being active to help with fatigue -Cellcept  500 mg BID -Continue staying  active -Discussed MG crisis, what to watch for, and when to go to nearest ED including difficulty breathing or swallowing   For bone health: -Vit D 1000 international units daily -Calcium  intake of 1000-1200 mg/day (diet or supplemental)   -Fall precautions discussed  Return to clinic in 6 months  Total time spent reviewing records, interview, history/exam, documentation, and coordination of care on day of encounter:  30 min  Venetia Potters, MD

## 2024-11-30 NOTE — Progress Notes (Unsigned)
 Marianela Mandrell T. Juanantonio Stolar, MD, CAQ Sports Medicine [facility] [address] [place], [address]  Phone: [phone]  FAX: [phone]  Jennifer Schmitt - 89 y.o. female  MRN [id number]  Date of Birth: 19-Sep-1935  Date: 12/01/2024  PCP: [name], MD  Referral: [name], MD  Chief Complaint  Patient presents with   Abdominal Pain    Follow Up Chronic RUQ Pain   Subjective:   Jennifer Schmitt is a 89 y.o. very pleasant female patient with Body mass index is 27.28 kg/m. who presents with the following:  Discussed the use of AI scribe software for clinical note transcription with the patient, who gave verbal consent to proceed.  Patient is here for follow-up regarding some nausea and concerns about her gallbladder.  She is seeing Dr. Randeen a few times in the recent past for this.  She does have some cholesterol deposits in the gallbladder. Dr. Randeen has referred her to gastroenterology.  History of Present Illness Jennifer Schmitt Dr. Salemi is an 89 year old female with myasthenia gravis who presents with abdominal discomfort and nausea.  She experiences abdominal discomfort primarily under the right rib cage, described as discomfort rather than pain. The discomfort is persistent but not worsening, and is more noticeable when the area is touched. Occasional nausea is present, which she partly attributes to her medications.  She has a history of gallbladder issues, with previous testing indicating cholesterol in the gallbladder. A CT scan earlier in the year suggested sludge or stones in the gallbladder. She has been modifying her diet to avoid fatty foods, particularly trans fats, to manage her symptoms.  Her past medical history includes myasthenia gravis, for which she takes CellCept  and other medications that cannot be discontinued. She also takes Xarelto  and amiodarone . She had a hysterectomy in the past but no other  abdominal surgeries.  She lives alone but has daily support and transportation assistance. Her son and daughter-in-law live nearby, providing additional support. She has been under significant stress due to the recent passing of her husband and his sister, impacting her emotional well-being.    Review of Systems is noted in the HPI, as appropriate  Patient Active Problem List   Diagnosis Date Noted   Myasthenia gravis (HCC) 05/06/2022    Priority: High   Atrial fibrillation (HCC)     Priority: High   MCI (mild cognitive impairment) 11/09/2019    Priority: Medium    Mood disorder 04/11/2015    Priority: Medium    Essential hypertension     Priority: Medium    Mixed hyperlipidemia     Priority: Medium    Age-related osteoporosis with current pathological fracture 03/01/2024    Priority: Low   Advanced directives, counseling/discussion 10/10/2014    Priority: Low   Obstructive sleep apnea 04/20/2014    Priority: Low   GERD without esophagitis     Priority: Low   Seasonal allergic rhinitis due to pollen     Priority: Low   PMR (Resolved)     Priority: Low   Mild chronic anemia 11/14/2024   Closed compression fracture of fourth lumbar vertebra (HCC) 03/01/2024   Overactive bladder 11/23/2017   Neuropathy 10/15/2016   Pernicious anemia 04/11/2015    Past Medical History:  Diagnosis Date   Adenomatous colon polyp    Allergic rhinitis due to pollen    Allergy childhood   Anemia 2014   Anxiety    Cataract  Removed 2019   Degenerative disc disease    cervical and lumbar   GERD (gastroesophageal reflux disease)    Heart murmur    Hyperlipidemia    Hypertension    Osteoarthritis, multiple sites    PAF (paroxysmal atrial fibrillation) (HCC)    a. 01/2013 Echo [facility]): EF 60-65%, no rwma, mildly dil LA/RA, mild AI/TR, trace MR, nl RV size/fxn, mild PAH; b. CHA2DS2VASc = 4-->Chronic Xarelto .   PMR (polymyalgia rheumatica)    Rheumatic fever    Sleep apnea     NO CPAP   Urge incontinence     Past Surgical History:  Procedure Laterality Date   ABDOMINAL HYSTERECTOMY  2003   ANKLE FRACTURE SURGERY Left 1996   BACK SURGERY     BREAST BIOPSY Left 2010   negative   CATARACT EXTRACTION W/PHACO Left 10/05/2018   Procedure: CATARACT EXTRACTION PHACO AND INTRAOCULAR LENS PLACEMENT (IOC);  Surgeon: Jaye Fallow, MD;  Location: [facility] ORS;  Service: Ophthalmology;  Laterality: Left;  US  00:40 CDE 6.92 fluid pack lot # [id number]      CATARACT EXTRACTION W/PHACO Right 10/26/2018   Procedure: CATARACT EXTRACTION PHACO AND INTRAOCULAR LENS PLACEMENT (IOC);  Surgeon: Jaye Fallow, MD;  Location: [facility] ORS;  Service: Ophthalmology;  Laterality: Right;  US   00:47 CDE 7.11 Fluid pack lot # [id number]   EYE SURGERY  2019   FRACTURE SURGERY  1996   Meningitis  1963   SPINE SURGERY     SYNOVECTOMY Right 1973   elbow   TONSILLECTOMY AND ADENOIDECTOMY      Family History  Problem Relation Age of Onset   Schizophrenia Mother        paranoid   Heart disease Mother        from psych meds   Arthritis Mother    Rheum arthritis Mother    Cancer Father        prostate   Arthritis Father    Diabetes Father    Breast cancer Paternal Aunt 58   Ovarian cancer Maternal Grandmother    Uterine cancer Maternal Grandmother    Colon cancer Maternal Grandmother    Healthy Son     Social History   Social History Narrative   Retired 2007    Widowed 5/25   1 son in [place] area   Spends part time in [place]    PhD in Developmental Disabilities with children      Has living will   Son is health care POA   Not sure about DNR---will keep open resuscitation for now    No tube feedings if cognitively unaware   Right handed                 Objective:   BP (!) 140/66   Pulse 60   Temp 99.1 F (37.3 C) (Temporal)   Ht 5' 6 (1.676 m)   Wt 169 lb (76.7 kg)   SpO2 98%   BMI 27.28 kg/m   GEN: No acute distress; alert,appropriate. CV: RRR, no  m/g/r  PULM: Normal respiratory rate, no accessory muscle use. No wheezes, crackles or rhonchi  ABD: Right upper quadrant abdominal pain.  No rebound.  No distention.  Positive bowel sounds.  Soft abdomen. PSYCH: Normally interactive.   Laboratory and Imaging Data:  CLINICAL DATA:  89 year old female with abdomen and flank pain. Burning with urination.   EXAM: CT ABDOMEN AND PELVIS WITHOUT CONTRAST   TECHNIQUE: Multidetector CT imaging of the abdomen and pelvis was performed following  the standard protocol without IV contrast.   RADIATION DOSE REDUCTION: This exam was performed according to the departmental dose-optimization program which includes automated exposure control, adjustment of the mA and/or kV according to patient size and/or use of iterative reconstruction technique.   COMPARISON:  Chest CT 05/15/2022.   FINDINGS: Lower chest: Stable cardiac size, within normal limits. Calcified coronary artery and aortic atherosclerosis. No pericardial pleural effusion. Chronic linear and platelike atelectasis/scarring in the left lower lobe is unchanged since 2023.   Hepatobiliary: Layering sludge or stones within the gallbladder. No regional inflammation. Otherwise negative noncontrast liver and gallbladder.   Pancreas: Negative.   Spleen: Negative.   Adrenals/Urinary Tract: Normal adrenal glands. Right renal exophytic upper pole cyst with simple fluid density (no follow-up imaging recommended). Subcentimeter contralateral hemorrhagic cyst of the left kidney also has a benign appearance (no follow-up imaging recommended). No nephrolithiasis. No hydronephrosis. No evidence of renal inflammation. Diminutive ureters. Pelvic phleboliths. Unremarkable bladder.   Stomach/Bowel: Redundant large bowel. Diverticulosis throughout the descending and sigmoid colon is severe in the left lower quadrant, but no active inflammation is identified. Retained stool in transverse colon and  at redundant hepatic flexures. Normal appendix suspected on coronal image 40. No large bowel inflammation. Decompressed terminal ileum nearby. No dilated small bowel. Decompressed stomach duodenum. No free air or free fluid.   Vascular/Lymphatic: Extensive Aortoiliac calcified atherosclerosis. Vascular patency is not evaluated in the absence of IV contrast. Normal caliber abdominal aorta. No lymphadenopathy.   Reproductive: Absent uterus.  Diminutive or absent ovaries.   Other: No pelvis free fluid.   Musculoskeletal: Normal lumbar segmentation. L4 superior endplate compression fracture with about 25% loss of vertebral body height appears un healed. Minimal adjacent paraspinal soft tissue edema or contusion. Mild retropulsion posterosuperior endplate results in 50%, moderate spinal stenosis when combined with moderate degenerative posterior element hypertrophy there, and especially right lateral recess stenosis (right L4 nerve level).   Background osteopenia. Asymmetric moderate right hip joint space loss and degeneration. No other acute osseous abnormality.   IMPRESSION: 1. Acute to subacute L4 compression fracture with 25% loss of height, and retropulsion contributing to Moderate spinal and right lateral recess stenosis when combined with facet arthropathy. 2. No other acute or inflammatory process identified in the noncontrast abdomen or pelvis. 3. Aortic Atherosclerosis (ICD10-I70.0). Large bowel diverticulosis. Gallbladder sludge or stones.     Electronically Signed   By: VEAR Hurst M.D.   On: 02/28/2024 04:45  EXAM: COMPLETE ABDOMINAL ULTRASOUND   TECHNIQUE: Real-time ultrasonography of the abdomen was performed.   COMPARISON: None available.   CLINICAL HISTORY: right upper quadrant pain and nausea/vomiting gallstones/sludge on last CT.   FINDINGS:   LIVER: The liver demonstrates coarse hepatic echotexture, suggesting hepatic steatosis. No intrahepatic biliary  ductal dilatation. No mass.   BILIARY SYSTEM: Mild cholesterolosis of the gallbladder wall, benign. No pericholecystic fluid or wall thickening. No cholelithiasis. Negative sonographic Murphy's sign. Common bile duct measures 3 mm.   KIDNEYS: The right kidney measures 10.2 cm. Normal contour. Normal cortical echogenicity. There is a 2.6 x 3.5 x 3.1 cm right renal cyst, simple, benign. The left kidney measures 10.1 cm. Normal contour. Normal cortical echogenicity. No hydronephrosis. No calculus. No mass.   PANCREAS: The pancreas is incompletely visualized due to overlying bowel gas.   SPLEEN: No acute abnormality. Spleen is within normal limits in size.   VESSELS: Visualized portion of the aorta is normal. Visualized portion of the inferior vena cava is normal.   OTHER:  No ascites.   IMPRESSION: 1. No acute findings. 2. Hepatic steatosis.   Electronically signed by: Pinkie Pebbles MD 10/05/2024 02:50 PM EDT RP Workstation: [id number]    Assessment and Plan:     ICD-10-CM   1. RUQ abdominal pain  R10.11 NM Hepato W/EF    2. Abdominal pain, chronic, right upper quadrant  R10.11 NM Hepato W/EF   G89.29     3. Nausea  R11.0 NM Hepato W/EF    4. Gallstones  K80.20 NM Hepato W/EF     Assessment & Plan Suspected chronic gallbladder disease Intermittent abdominal discomfort and nausea, possibly related to gallbladder issues. Previous imaging suggested possible sludge or small stones. Normal liver function, pancreatic enzymes, and kidney function.  Continues to have right upper quadrant abdominal pain. - Ordered HIDA scan to assess gallbladder function.  The patient had gallstones on abdominal CT from February 28, 2024.  Follow-up ultrasound on October 04, 2024 did not show any evidence of cholecystitis.  Clinical history is suggestive of gallbladder disease. - Ensured referral to gastroenterologist is scheduled. - Will discuss potential need for gallbladder removal if HIDA  scan is abnormal.  Chronic pain, right lower rib area Chronic discomfort possibly related to previous rib injury or degenerative changes. Pain is manageable. - Continue current management and monitor symptoms.  Myasthenia gravis On multiple medications, including Cellcept .  Short-term memory is a problem, and a child psychotherapist helps coordinate care. - Continue current myasthenia gravis medications.    Orders placed today for conditions managed today: Orders Placed This Encounter  Procedures   NM Hepato W/EF    Disposition: No follow-ups on file.  [employer] One speech-to-text software was used for transcription in this dictation.  Possible transcriptional errors can occur using Animal nutritionist.   Signed,  Jacques DASEN. Whitley Patchen, MD   Outpatient Encounter Medications as of 12/01/2024  Medication Sig   acetaminophen (TYLENOL) 500 MG tablet Take 1,000 mg by mouth as needed.   amiodarone  (PACERONE ) 200 MG tablet Take 1 tablet (200 mg total) by mouth every other day.   calcium  carbonate (CALCIUM  600) 600 MG TABS tablet Take 1 tablet (600 mg total) by mouth daily.   Cholecalciferol (VITAMIN D) 2000 units CAPS Take 2,000 Units by mouth daily.   cyanocobalamin  (VITAMIN B12) 1000 MCG/ML injection INJECT ONE ML MONTHLY AS INSTRUCTED   diltiazem  (CARDIZEM ) 30 MG tablet Take 1 tablet (30 mg total) by mouth 3 (three) times daily as needed (atrial fibrillation lasting more than 2 hours).   fluticasone (FLONASE) 50 MCG/ACT nasal spray Place 1 spray into both nostrils daily.   folic acid  (FOLVITE ) 1 MG tablet TAKE ONE TABLET BY MOUTH DAILY   lansoprazole  (PREVACID ) 15 MG capsule TAKE ONE CAPSULE BY MOUTH DAILY AT NOON   LORazepam  (ATIVAN ) 0.5 MG tablet Take 0.5 tablets (0.25 mg total) by mouth 2 (two) times daily as needed for anxiety.   metoprolol  succinate (TOPROL -XL) 25 MG 24 hr tablet TAKE ONE TABLET BY MOUTH DAILY   MIEBO 1.338 GM/ML SOLN Apply 1 drop to eye daily.   Multiple  Vitamins-Minerals (PRESERVISION AREDS 2 PO) Take by mouth daily.   mycophenolate  (CELLCEPT ) 500 MG tablet TAKE ONE TABLET (500 MG TOTAL) BY MOUTH TWO (TWO) TIMES DAILY.   ondansetron  (ZOFRAN ) 4 MG tablet Take 1 tablet (4 mg total) by mouth 3 (three) times daily as needed for nausea or vomiting.   predniSONE  (DELTASONE ) 1 MG tablet Take 4 tablets (4 mg total) by mouth daily with breakfast.  risedronate  (ACTONEL ) 150 MG tablet Take 1 tablet (150 mg total) by mouth every 30 (thirty) days. with water on empty stomach, nothing by mouth or lie down for next 30 minutes.   rosuvastatin  (CRESTOR ) 5 MG tablet TAKE ONE TABLET BY MOUTH ONCE A DAY   Trolamine Salicylate (ASPERCREME EX) Apply topically as needed.   valsartan  (DIOVAN ) 80 MG tablet TAKE HALF A TABLET BY MOUTH DAILY   XARELTO  20 MG TABS tablet TAKE ONE TABLET BY MOUTH ONCE A DAY WITH THE EVENING MEAL.   No facility-administered encounter medications on file as of 12/01/2024.

## 2024-12-01 ENCOUNTER — Ambulatory Visit: Admitting: Family Medicine

## 2024-12-01 ENCOUNTER — Encounter: Payer: Self-pay | Admitting: Family Medicine

## 2024-12-01 ENCOUNTER — Telehealth: Payer: Self-pay | Admitting: Family Medicine

## 2024-12-01 ENCOUNTER — Other Ambulatory Visit: Payer: Self-pay | Admitting: Neurology

## 2024-12-01 VITALS — BP 140/66 | HR 60 | Temp 99.1°F | Ht 66.0 in | Wt 169.0 lb

## 2024-12-01 DIAGNOSIS — R110 Nausea: Secondary | ICD-10-CM | POA: Diagnosis not present

## 2024-12-01 DIAGNOSIS — K8020 Calculus of gallbladder without cholecystitis without obstruction: Secondary | ICD-10-CM

## 2024-12-01 DIAGNOSIS — G8929 Other chronic pain: Secondary | ICD-10-CM

## 2024-12-01 DIAGNOSIS — R1011 Right upper quadrant pain: Secondary | ICD-10-CM | POA: Diagnosis not present

## 2024-12-01 NOTE — Telephone Encounter (Signed)
 The patient was referred to [facility] on 11/14/2024, but she has not gotten an appointment.  Please check on the status of this referral.  Thank-you.

## 2024-12-02 ENCOUNTER — Other Ambulatory Visit: Payer: Self-pay | Admitting: Family Medicine

## 2024-12-02 MED ORDER — ROSUVASTATIN CALCIUM 5 MG PO TABS: 5.0000 mg | ORAL_TABLET | Freq: Every day | ORAL | 3 refills | Status: AC

## 2024-12-07 ENCOUNTER — Other Ambulatory Visit

## 2024-12-07 NOTE — Telephone Encounter (Signed)
 This referral was processed on 11/14/24 and a MyChart letter was generated and sent to the patient on 11/14/24 at 1:30 PM  with contact information of who to reach out to if not heard anything within 5 business days.   See referral notes.   Patient can contact their office directly or await their call. They are likely backlogged with referral due to it being right here around [place].

## 2024-12-08 ENCOUNTER — Encounter: Payer: Self-pay | Admitting: Neurology

## 2024-12-08 ENCOUNTER — Ambulatory Visit: Admitting: Neurology

## 2024-12-08 VITALS — BP 143/75 | HR 75 | Wt 168.0 lb

## 2024-12-08 DIAGNOSIS — G629 Polyneuropathy, unspecified: Secondary | ICD-10-CM

## 2024-12-08 DIAGNOSIS — G7000 Myasthenia gravis without (acute) exacerbation: Secondary | ICD-10-CM | POA: Diagnosis not present

## 2024-12-08 NOTE — Patient Instructions (Signed)
 -Will check CBC w/ diff and CMP at next visit  -Reduce Prednisone  to 3 mg daily -Mestinon  60 mg up to three times daily as needed - can take prior to being active to help with fatigue -Cellcept  500 mg twice daily -Continue staying active  Go to nearest emergency room if you have severe weakness, difficulty breathing or swallowing as this could be a myasthenic crisis (flare).   For bone health: -Vit D 1000 international units daily -Calcium  intake of 1000-1200 mg/day (diet or supplemental)  I will see you again in about 6 months.  Please let me know if you have any questions or concerns in the meantime.  The physicians and staff at [facility] are committed to providing excellent care. You may receive a survey requesting feedback about your experience at our office. We strive to receive very good responses to the survey questions. If you feel that your experience would prevent you from giving the office a very good  response, please contact our office to try to remedy the situation. We may be reached at [phone]. Thank you for taking the time out of your busy day to complete the survey.  Jennifer Potters, MD [facility]  Preventing Falls at [facility] are common, often dreaded events in the lives of older people. Aside from the obvious injuries and even death that may result, fall can cause wide-ranging consequences including loss of independence, mental decline, decreased activity and mobility. Younger people are also at risk of falling, especially those with chronic illnesses and fatigue.  Ways to reduce risk for falling Examine diet and medications. Warm foods and alcohol dilate blood vessels, which can lead to dizziness when standing. Sleep aids, antidepressants and pain medications can also increase the likelihood of a fall.  Get a vision exam. Poor vision, cataracts and glaucoma increase the chances of falling.  Check foot gear. Shoes should fit snugly and have a  sturdy, nonskid sole and a broad, low heel  Participate in a physician-approved exercise program to build and maintain muscle strength and improve balance and coordination. Programs that use ankle weights or stretch bands are excellent for muscle-strengthening. Water aerobics programs and low-impact Tai Chi programs have also been shown to improve balance and coordination.  Increase vitamin D intake. Vitamin D improves muscle strength and increases the amount of calcium  the body is able to absorb and deposit in bones.  How to prevent falls from common hazards Floors - Remove all loose wires, cords, and throw rugs. Minimize clutter. Make sure rugs are anchored and smooth. Keep furniture in its usual place.  Chairs -- Use chairs with straight backs, armrests and firm seats. Add firm cushions to existing pieces to add height.  Bathroom - Install grab bars and non-skid tape in the tub or shower. Use a bathtub transfer bench or a shower chair with a back support Use an elevated toilet seat and/or safety rails to assist standing from a low surface. Do not use towel racks or bathroom tissue holders to help you stand.  Lighting - Make sure halls, stairways, and entrances are well-lit. Install a night light in your bathroom or hallway. Make sure there is a light switch at the top and bottom of the staircase. Turn lights on if you get up in the middle of the night. Make sure lamps or light switches are within reach of the bed if you have to get up during the night.  Kitchen - Install non-skid rubber mats near the sink  and stove. Clean spills immediately. Store frequently used utensils, pots, pans between waist and eye level. This helps prevent reaching and bending. Sit when getting things out of lower cupboards.  Living room/ Bedrooms - Place furniture with wide spaces in between, giving enough room to move around. Establish a route through the living room that gives you something to hold onto as you  walk.  Stairs - Make sure treads, rails, and rugs are secure. Install a rail on both sides of the stairs. If stairs are a threat, it might be helpful to arrange most of your activities on the lower level to reduce the number of times you must climb the stairs.  Entrances and doorways - Install metal handles on the walls adjacent to the doorknobs of all doors to make it more secure as you travel through the doorway.  Tips for maintaining balance Keep at least one hand free at all times. Try using a backpack or fanny pack to hold things rather than carrying them in your hands. Never carry objects in both hands when walking as this interferes with keeping your balance.  Attempt to swing both arms from front to back while walking. This might require a conscious effort if Parkinson's disease has diminished your movement. It will, however, help you to maintain balance and posture, and reduce fatigue.  Consciously lift your feet off of the ground when walking. Shuffling and dragging of the feet is a common culprit in losing your balance.  When trying to navigate turns, use a U technique of facing forward and making a wide turn, rather than pivoting sharply.  Try to stand with your feet shoulder-length apart. When your feet are close together for any length of time, you increase your risk of losing your balance and falling.  Do one thing at a time. Don't try to walk and accomplish another task, such as reading or looking around. The decrease in your automatic reflexes complicates motor function, so the less distraction, the better.  Do not wear rubber or gripping soled shoes, they might catch on the floor and cause tripping.  Move slowly when changing positions. Use deliberate, concentrated movements and, if needed, use a grab bar or walking aid. Count 15 seconds between each movement. For example, when rising from a seated position, wait 15 seconds after standing to begin walking.  If balance is  a continuous problem, you might want to consider a walking aid such as a cane, walking stick, or walker. Once you've mastered walking with help, you might be ready to try it on your own again.

## 2024-12-08 NOTE — Telephone Encounter (Signed)
 I reported this case to our manager.  89 yo never received any contact and does not use mychart.

## 2024-12-12 ENCOUNTER — Encounter: Admitting: Family Medicine

## 2024-12-13 ENCOUNTER — Other Ambulatory Visit: Payer: Self-pay | Admitting: Family Medicine

## 2024-12-13 ENCOUNTER — Encounter
Admission: RE | Admit: 2024-12-13 | Discharge: 2024-12-13 | Disposition: A | Discharge status: Home / Self Care | Source: Ambulatory Visit | Attending: Family Medicine | Admitting: Family Medicine

## 2024-12-13 ENCOUNTER — Ambulatory Visit: Payer: Self-pay | Admitting: Family Medicine

## 2024-12-13 DIAGNOSIS — R110 Nausea: Secondary | ICD-10-CM | POA: Diagnosis present

## 2024-12-13 DIAGNOSIS — G8929 Other chronic pain: Secondary | ICD-10-CM | POA: Diagnosis present

## 2024-12-13 DIAGNOSIS — R1011 Right upper quadrant pain: Secondary | ICD-10-CM | POA: Insufficient documentation

## 2024-12-13 DIAGNOSIS — K8020 Calculus of gallbladder without cholecystitis without obstruction: Secondary | ICD-10-CM | POA: Diagnosis present

## 2024-12-13 MED ORDER — TECHNETIUM TC 99M MEBROFENIN IV KIT
5.0000 | PACK | Freq: Once | INTRAVENOUS | Status: AC | PRN
  Administered 2024-12-13: 5.41 via INTRAVENOUS

## 2024-12-21 ENCOUNTER — Other Ambulatory Visit: Payer: Self-pay | Admitting: Neurology

## 2024-12-21 ENCOUNTER — Ambulatory Visit: Admitting: Neurology

## 2024-12-21 ENCOUNTER — Other Ambulatory Visit: Payer: Self-pay | Admitting: Cardiology

## 2024-12-21 ENCOUNTER — Other Ambulatory Visit: Payer: Self-pay | Admitting: Cardiovascular Disease

## 2024-12-21 DIAGNOSIS — I4819 Other persistent atrial fibrillation: Secondary | ICD-10-CM

## 2024-12-21 NOTE — Telephone Encounter (Signed)
 Prescription refill request for Xarelto  received.  Indication:afib Last office visit:11/25 Weight:76.2  kg Age:89 Scr:0.85  10/25 CrCl:56.09  ml/min  Prescription refilled

## 2024-12-23 ENCOUNTER — Other Ambulatory Visit: Payer: Self-pay | Admitting: Family Medicine

## 2024-12-23 MED ORDER — FOLIC ACID 1 MG PO TABS: 1.0000 mg | ORAL_TABLET | Freq: Every day | ORAL | 3 refills | Status: AC

## 2025-01-06 ENCOUNTER — Telehealth: Payer: Self-pay | Admitting: *Deleted

## 2025-01-06 NOTE — Telephone Encounter (Signed)
 Lab orders faxed to [facility] [phone] as requested.

## 2025-01-06 NOTE — Telephone Encounter (Signed)
 Please review recent labs and let me know what you would like to order for her CPE labs for March.

## 2025-01-06 NOTE — Telephone Encounter (Signed)
 Copied from CRM #[id number]. Topic: Clinical - Request for Lab/Test Order >> Jan 06, 2025 10:32 AM Jennifer Schmitt wrote: Reason for CRM: Jennifer Schmitt called in because pt has her CPE upcoming in March and the lab a week before  Jennifer Schmitt would like for order to be sent to them so they can draw lab at her living facility since pt no longer drives.   Lab in question for 02/20/2025 - at 9AM   Fax: [phone]   Jennifer Schmitt - Jennifer Schmitt [phone]

## 2025-01-06 NOTE — Telephone Encounter (Signed)
 FLP, E78.5 hyperlipidemia  Hemoglobin a1c: hyperglycemia Cbc with diff, HFP, BMET, TSH, Free T4: R53.83 long-term medication usage  Iron / Ferritin panel: Iron deficiency anemia Vit D: E55.9 low vitamin d levels

## 2025-01-17 ENCOUNTER — Other Ambulatory Visit: Payer: Self-pay | Admitting: Cardiovascular Disease

## 2025-01-30 ENCOUNTER — Ambulatory Visit

## 2025-02-20 ENCOUNTER — Other Ambulatory Visit

## 2025-02-27 ENCOUNTER — Encounter: Admitting: Family Medicine

## 2025-03-09 ENCOUNTER — Encounter: Admitting: Family Medicine

## 2025-05-16 ENCOUNTER — Ambulatory Visit: Admitting: Cardiology

## 2025-07-06 ENCOUNTER — Ambulatory Visit: Payer: Self-pay | Admitting: Neurology
